# Patient Record
Sex: Female | Born: 1950 | Race: Black or African American | Hispanic: No | Marital: Single | Smoking: Current every day smoker
Health system: Southern US, Community
[De-identification: ages and names within clinical notes are randomized; demographics above are authoritative.]

## PROBLEM LIST (undated history)

## (undated) DIAGNOSIS — I10 Essential (primary) hypertension: Secondary | ICD-10-CM

## (undated) DIAGNOSIS — E213 Hyperparathyroidism, unspecified: Secondary | ICD-10-CM

## (undated) DIAGNOSIS — F32A Depression, unspecified: Secondary | ICD-10-CM

## (undated) DIAGNOSIS — E119 Type 2 diabetes mellitus without complications: Secondary | ICD-10-CM

## (undated) DIAGNOSIS — Z9289 Personal history of other medical treatment: Secondary | ICD-10-CM

## (undated) DIAGNOSIS — E785 Hyperlipidemia, unspecified: Secondary | ICD-10-CM

## (undated) DIAGNOSIS — L03811 Cellulitis of head [any part, except face]: Secondary | ICD-10-CM

## (undated) DIAGNOSIS — F329 Major depressive disorder, single episode, unspecified: Secondary | ICD-10-CM

## (undated) DIAGNOSIS — N189 Chronic kidney disease, unspecified: Secondary | ICD-10-CM

## (undated) DIAGNOSIS — D649 Anemia, unspecified: Secondary | ICD-10-CM

## (undated) HISTORY — DX: Major depressive disorder, single episode, unspecified: F32.9

## (undated) HISTORY — DX: Essential (primary) hypertension: I10

## (undated) HISTORY — DX: Chronic kidney disease, unspecified: N18.9

## (undated) HISTORY — DX: Hyperlipidemia, unspecified: E78.5

## (undated) HISTORY — DX: Type 2 diabetes mellitus without complications: E11.9

## (undated) HISTORY — DX: Depression, unspecified: F32.A

## (undated) HISTORY — PX: ABDOMINAL HYSTERECTOMY: SHX81

---

## 1998-11-05 ENCOUNTER — Emergency Department (HOSPITAL_COMMUNITY): Admission: EM | Admit: 1998-11-05 | Discharge: 1998-11-05 | Payer: Self-pay

## 1998-11-05 ENCOUNTER — Ambulatory Visit (HOSPITAL_COMMUNITY): Admission: RE | Admit: 1998-11-05 | Discharge: 1998-11-05 | Payer: Self-pay | Admitting: Obstetrics & Gynecology

## 1998-11-05 ENCOUNTER — Encounter: Payer: Self-pay | Admitting: Family Medicine

## 2000-11-15 ENCOUNTER — Emergency Department (HOSPITAL_COMMUNITY): Admission: EM | Admit: 2000-11-15 | Discharge: 2000-11-15 | Payer: Self-pay

## 2002-12-05 ENCOUNTER — Ambulatory Visit (HOSPITAL_COMMUNITY): Admission: RE | Admit: 2002-12-05 | Discharge: 2002-12-05 | Payer: Self-pay | Admitting: Internal Medicine

## 2003-10-05 ENCOUNTER — Ambulatory Visit: Payer: Self-pay | Admitting: Internal Medicine

## 2003-11-02 ENCOUNTER — Ambulatory Visit: Payer: Self-pay | Admitting: *Deleted

## 2004-05-12 ENCOUNTER — Ambulatory Visit: Payer: Self-pay | Admitting: Family Medicine

## 2004-10-08 ENCOUNTER — Ambulatory Visit: Payer: Self-pay | Admitting: Family Medicine

## 2005-03-20 ENCOUNTER — Ambulatory Visit: Payer: Self-pay | Admitting: Family Medicine

## 2005-04-22 ENCOUNTER — Ambulatory Visit: Payer: Self-pay | Admitting: Family Medicine

## 2005-06-04 ENCOUNTER — Ambulatory Visit: Payer: Self-pay | Admitting: Family Medicine

## 2005-08-17 ENCOUNTER — Ambulatory Visit: Payer: Self-pay | Admitting: Family Medicine

## 2006-05-12 ENCOUNTER — Ambulatory Visit: Payer: Self-pay | Admitting: Family Medicine

## 2006-05-18 ENCOUNTER — Ambulatory Visit: Payer: Self-pay | Admitting: Family Medicine

## 2006-06-01 ENCOUNTER — Ambulatory Visit: Payer: Self-pay | Admitting: Family Medicine

## 2006-07-11 ENCOUNTER — Emergency Department (HOSPITAL_COMMUNITY): Admission: EM | Admit: 2006-07-11 | Discharge: 2006-07-11 | Payer: Self-pay | Admitting: Emergency Medicine

## 2006-10-20 ENCOUNTER — Ambulatory Visit: Payer: Self-pay | Admitting: Internal Medicine

## 2006-10-20 ENCOUNTER — Encounter (INDEPENDENT_AMBULATORY_CARE_PROVIDER_SITE_OTHER): Payer: Self-pay | Admitting: *Deleted

## 2007-04-29 ENCOUNTER — Ambulatory Visit: Payer: Self-pay | Admitting: Internal Medicine

## 2007-04-29 ENCOUNTER — Encounter (INDEPENDENT_AMBULATORY_CARE_PROVIDER_SITE_OTHER): Payer: Self-pay | Admitting: Family Medicine

## 2007-04-29 LAB — CONVERTED CEMR LAB: Microalb, Ur: 0.96 mg/dL (ref 0.00–1.89)

## 2008-04-04 ENCOUNTER — Ambulatory Visit: Payer: Self-pay | Admitting: Internal Medicine

## 2008-04-05 ENCOUNTER — Encounter (INDEPENDENT_AMBULATORY_CARE_PROVIDER_SITE_OTHER): Payer: Self-pay | Admitting: Adult Health

## 2008-04-11 ENCOUNTER — Ambulatory Visit (HOSPITAL_COMMUNITY): Admission: RE | Admit: 2008-04-11 | Discharge: 2008-04-11 | Payer: Self-pay | Admitting: Family Medicine

## 2008-05-14 ENCOUNTER — Ambulatory Visit: Payer: Self-pay | Admitting: Internal Medicine

## 2008-05-14 ENCOUNTER — Encounter: Payer: Self-pay | Admitting: Family Medicine

## 2008-05-14 LAB — CONVERTED CEMR LAB
Albumin: 4.1 g/dL (ref 3.5–5.2)
BUN: 11 mg/dL (ref 6–23)
Basophils Absolute: 0 10*3/uL (ref 0.0–0.1)
Basophils Relative: 0 % (ref 0–1)
CO2: 24 meq/L (ref 19–32)
Cholesterol: 70 mg/dL (ref 0–200)
Eosinophils Relative: 1 % (ref 0–5)
Glucose, Bld: 144 mg/dL — ABNORMAL HIGH (ref 70–99)
HCT: 40.1 % (ref 36.0–46.0)
HDL: 42 mg/dL (ref >39)
Hemoglobin: 12.6 g/dL (ref 12.0–15.0)
Lymphocytes Relative: 37 % (ref 12–46)
MCHC: 31.4 g/dL (ref 30.0–36.0)
Monocytes Absolute: 0.4 10*3/uL (ref 0.1–1.0)
Monocytes Relative: 5 % (ref 3–12)
Neutro Abs: 4.7 10*3/uL (ref 1.7–7.7)
Potassium: 4.6 meq/L (ref 3.5–5.3)
RBC: 4.62 M/uL (ref 3.87–5.11)
RDW: 13.2 % (ref 11.5–15.5)
Sodium: 141 meq/L (ref 135–145)
Total Protein: 6.4 g/dL (ref 6.0–8.3)
Triglycerides: 68 mg/dL (ref <150)

## 2008-05-18 ENCOUNTER — Ambulatory Visit: Payer: Self-pay | Admitting: Internal Medicine

## 2008-07-09 ENCOUNTER — Ambulatory Visit: Payer: Self-pay | Admitting: Internal Medicine

## 2009-01-24 ENCOUNTER — Encounter: Payer: Self-pay | Admitting: Family Medicine

## 2009-01-24 ENCOUNTER — Ambulatory Visit: Payer: Self-pay | Admitting: Family Medicine

## 2009-01-24 LAB — CONVERTED CEMR LAB
Eosinophils Absolute: 0.1 10*3/uL (ref 0.0–0.7)
Eosinophils Relative: 1 % (ref 0–5)
HCT: 43.4 % (ref 36.0–46.0)
HDL: 36 mg/dL — ABNORMAL LOW (ref >39)
Hemoglobin: 14.6 g/dL (ref 12.0–15.0)
Lymphs Abs: 3.5 10*3/uL (ref 0.7–4.0)
MCHC: 33.6 g/dL (ref 30.0–36.0)
MCV: 84.6 fL (ref 78.0–100.0)
Monocytes Absolute: 0.4 10*3/uL (ref 0.1–1.0)
Monocytes Relative: 4 % (ref 3–12)
RBC: 5.13 M/uL — ABNORMAL HIGH (ref 3.87–5.11)
Total CHOL/HDL Ratio: 1.8
VLDL: 30 mg/dL (ref 0–40)
WBC: 10.1 10*3/uL (ref 4.0–10.5)

## 2009-02-07 ENCOUNTER — Ambulatory Visit: Payer: Self-pay | Admitting: Family Medicine

## 2009-05-09 ENCOUNTER — Ambulatory Visit: Payer: Self-pay | Admitting: Family Medicine

## 2009-08-22 ENCOUNTER — Ambulatory Visit: Payer: Self-pay | Admitting: Family Medicine

## 2009-10-23 ENCOUNTER — Encounter (INDEPENDENT_AMBULATORY_CARE_PROVIDER_SITE_OTHER): Payer: Self-pay | Admitting: Family Medicine

## 2009-10-23 LAB — CONVERTED CEMR LAB
AST: 14 units/L (ref 0–37)
Alkaline Phosphatase: 106 units/L (ref 39–117)
Glucose, Bld: 448 mg/dL — ABNORMAL HIGH (ref 70–99)
Sodium: 138 meq/L (ref 135–145)
Total Bilirubin: 0.2 mg/dL — ABNORMAL LOW (ref 0.3–1.2)
Total Protein: 6.8 g/dL (ref 6.0–8.3)

## 2009-11-22 ENCOUNTER — Ambulatory Visit: Payer: Self-pay | Admitting: Family Medicine

## 2011-12-21 ENCOUNTER — Ambulatory Visit: Payer: Self-pay | Admitting: Family Medicine

## 2012-01-14 ENCOUNTER — Ambulatory Visit (INDEPENDENT_AMBULATORY_CARE_PROVIDER_SITE_OTHER): Payer: Self-pay | Admitting: Family Medicine

## 2012-01-14 ENCOUNTER — Encounter: Payer: Self-pay | Admitting: Family Medicine

## 2012-01-14 VITALS — BP 169/78 | HR 92 | Temp 98.7°F | Ht 67.0 in | Wt 154.0 lb

## 2012-01-14 DIAGNOSIS — Z720 Tobacco use: Secondary | ICD-10-CM

## 2012-01-14 DIAGNOSIS — F17200 Nicotine dependence, unspecified, uncomplicated: Secondary | ICD-10-CM

## 2012-01-14 DIAGNOSIS — I10 Essential (primary) hypertension: Secondary | ICD-10-CM | POA: Insufficient documentation

## 2012-01-14 DIAGNOSIS — E1165 Type 2 diabetes mellitus with hyperglycemia: Secondary | ICD-10-CM | POA: Insufficient documentation

## 2012-01-14 DIAGNOSIS — E119 Type 2 diabetes mellitus without complications: Secondary | ICD-10-CM

## 2012-01-14 MED ORDER — METFORMIN HCL 1000 MG PO TABS: 1000.0000 mg | ORAL_TABLET | Freq: Two times a day (BID) | ORAL | Status: DC

## 2012-01-14 MED ORDER — GLIPIZIDE 10 MG PO TABS: 10.0000 mg | ORAL_TABLET | Freq: Two times a day (BID) | ORAL | Status: DC

## 2012-01-14 MED ORDER — LANCET DEVICES MISC: 1.0000 | Freq: Every day | Status: DC

## 2012-01-14 MED ORDER — INSULIN GLARGINE 100 UNIT/ML ~~LOC~~ SOLN: 30.0000 [IU] | Freq: Every day | SUBCUTANEOUS | Status: DC

## 2012-01-14 MED ORDER — LISINOPRIL-HYDROCHLOROTHIAZIDE 20-25 MG PO TABS: 1.0000 | ORAL_TABLET | Freq: Every day | ORAL | Status: DC

## 2012-01-14 MED ORDER — INSULIN GLARGINE 100 UNIT/ML ~~LOC~~ SOLN: SUBCUTANEOUS | Status: DC

## 2012-01-14 MED ORDER — GLUCOSE BLOOD VI STRP: ORAL_STRIP | Status: DC

## 2012-01-14 NOTE — Assessment & Plan Note (Signed)
Discussed dangers of smoking, pt in pre-contemplative stage.

## 2012-01-14 NOTE — Patient Instructions (Signed)
It was nice to meet you.  Please go to the [facility] and re-apply for the MAP (medication assistance program).   Please take your medications as prescribed.   Please come back and see me after you get the orange card so we can do a health maintenance exam and lab work, or as needed for problems.

## 2012-01-14 NOTE — Progress Notes (Signed)
  Subjective:    Patient ID: Sara Davis, female    DOB: 03/20/1950, 61 y.o.   MRN: [id number]  HPI  Sara Davis comes in to establish care.    DM- takes Lantus 30 (which she is out of), and glipizide 10 BID and metformin 1000 BID.  She has not been checking her blood sugar regularly because she is running out of supplies.  She says she checked it last week and it was 202.  Denies polyuria or polydipsia.   HTN: Was taking Lisinopril/HCTZ without difficulty, but has run out Denies chest pain, dizziness, palpitations, LE edema.  Patient does not check blood pressures.   Tobacco abuse: Has been smoking 1ppd since she was 19 (at least 40 pack year history).  Denies problems with asthma/COPD/Chronic bronchitis.  She is not currently interested in quitting.   Past Medical History  Diagnosis Date  . Hypertension   . Diabetes mellitus without complication   . Depression    Family History  Problem Relation Age of Onset  . Diabetes Mother   . Cancer Mother   . Diabetes Sister   . Hypertension Sister   . Cancer Sister 40    Bone  . Diabetes Brother    History  Substance Use Topics  . Smoking status: Current Every Day Smoker -- 1.0 packs/day for 40 years    Types: Cigarettes  . Smokeless tobacco: Never Used  . Alcohol Use: No    Review of Systems    Pertinent items in HPI Objective:   Physical Exam BP 169/78  Pulse 92  Temp 98.7 F (37.1 C) (Oral)  Ht 5\' 7"  (1.702 m)  Wt 154 lb (69.854 kg)  BMI 24.12 kg/m2 General appearance: alert, cooperative and no distress Neck: no adenopathy, supple, symmetrical, trachea midline and thyroid not enlarged, symmetric, no tenderness/mass/nodules Lungs: clear to auscultation bilaterally Heart: regular rate and rhythm, S1, S2 normal, no murmur, click, rub or gallop Extremities: extremities normal, atraumatic, no cyanosis or edema Pulses: 2+ and symmetric       Assessment & Plan:

## 2012-01-14 NOTE — Assessment & Plan Note (Signed)
Likely poorly controlled recently.  Gave pt 2 lantus solostar pens with needles, Rx for Lantus to take to [facility] for MAP program, Rx for metformin and glipizide sent to [facility].

## 2012-01-14 NOTE — Assessment & Plan Note (Signed)
Elevated but pt off medications, no symptoms.  Rx for Lisinopril/HCTZ, f/u after orange card to get labs.

## 2012-06-24 ENCOUNTER — Ambulatory Visit (INDEPENDENT_AMBULATORY_CARE_PROVIDER_SITE_OTHER): Payer: BC Managed Care – PPO | Admitting: Family Medicine

## 2012-06-24 VITALS — BP 170/83 | HR 87 | Temp 98.4°F | Ht 67.0 in | Wt 140.0 lb

## 2012-06-24 DIAGNOSIS — E119 Type 2 diabetes mellitus without complications: Secondary | ICD-10-CM

## 2012-06-24 DIAGNOSIS — F17200 Nicotine dependence, unspecified, uncomplicated: Secondary | ICD-10-CM

## 2012-06-24 DIAGNOSIS — I10 Essential (primary) hypertension: Secondary | ICD-10-CM

## 2012-06-24 DIAGNOSIS — Z720 Tobacco use: Secondary | ICD-10-CM

## 2012-06-24 LAB — POCT GLYCOSYLATED HEMOGLOBIN (HGB A1C): Hemoglobin A1C: >14

## 2012-06-24 MED ORDER — INSULIN GLARGINE 100 UNIT/ML SOLOSTAR PEN: 30.0000 [IU] | PEN_INJECTOR | Freq: Every day | SUBCUTANEOUS | Status: DC

## 2012-06-24 MED ORDER — INSULIN PEN NEEDLE 31G X 8 MM MISC: Status: DC

## 2012-06-24 MED ORDER — ACCU-CHEK AVIVA PLUS W/DEVICE KIT: 1.0000 | PACK | Freq: Once | Status: DC

## 2012-06-24 MED ORDER — GLIPIZIDE 10 MG PO TABS: 10.0000 mg | ORAL_TABLET | Freq: Two times a day (BID) | ORAL | Status: DC

## 2012-06-24 MED ORDER — LISINOPRIL-HYDROCHLOROTHIAZIDE 20-25 MG PO TABS: 1.0000 | ORAL_TABLET | Freq: Every day | ORAL | Status: DC

## 2012-06-24 MED ORDER — METFORMIN HCL 1000 MG PO TABS: 1000.0000 mg | ORAL_TABLET | Freq: Two times a day (BID) | ORAL | Status: DC

## 2012-06-24 MED ORDER — GLUCOSE BLOOD VI STRP: ORAL_STRIP | Status: DC

## 2012-06-24 MED ORDER — ACCU-CHEK FASTCLIX LANCET KIT: 1.0000 | PACK | Freq: Once | Status: DC

## 2012-06-24 NOTE — Assessment & Plan Note (Signed)
Elevated, off of medication, again stressed importance of compliance.  Meds refilled, f/u in one week.

## 2012-06-24 NOTE — Progress Notes (Signed)
  Subjective:    Patient ID: Sara Davis, female    DOB: 08/02/1950, 62 y.o.   MRN: [id number]  HPI:  Ms. Landuyt comes in for follow up. She is not feeling very well. She has lost weight, and complains of her feet being cold all the time.   DM: says she is taking Lantus 4 units (not the prescribed 30 units), and metformin and glipizide.  She has not been checking blood sugars because her pharmacy did not have the testing strips I had written a Rx for.   HTN: ran out of Prinizide, needs new refill.  Denies chest pain, dyspnea, LE edema, palpitations.   Past Medical History  Diagnosis Date  . Hypertension   . Diabetes mellitus without complication   . Depression     History  Substance Use Topics  . Smoking status: Current Every Day Smoker -- 1.00 packs/day for 40 years    Types: Cigarettes  . Smokeless tobacco: Never Used  . Alcohol Use: No    Family History  Problem Relation Age of Onset  . Diabetes Mother   . Cancer Mother   . Diabetes Sister   . Hypertension Sister   . Cancer Sister 49    Bone  . Diabetes Brother      ROS Pertinent items in HPI    Objective:  Physical Exam:  BP 170/83  Pulse 87  Temp(Src) 98.4 F (36.9 C) (Oral)  Ht 5\' 7"  (1.702 m)  Wt 140 lb (63.504 kg)  BMI 21.92 kg/m2 General appearance: alert, cooperative and no distress Head: Normocephalic, without obvious abnormality, atraumatic Lungs: clear to auscultation bilaterally Heart: regular rate and rhythm, S1, S2 normal, no murmur, click, rub or gallop Pulses: 2+ and symmetric Extrem: No edema, feet warm and well perfused.        Assessment & Plan:

## 2012-06-24 NOTE — Assessment & Plan Note (Signed)
Has cut down to 1/2 ppd but not ready to quit.

## 2012-06-24 NOTE — Patient Instructions (Signed)
It was good to see you today.  Your Hemoglobin A1C is  Lab Results  Component Value Date   HGBA1C >14.0 06/24/2012  .  Remember your goal for A1C is less than 7.  Your goal for fasting morning blood sugar is 80-120.    Please take 30 units of lantus daily, and keep a record of your blood sugars- check it once in the morning before eating and one in the PM.   Your blood pressure today was BP: 170/83 mmHg.  Remember your goal blood pressure is about 130/80.  Please be sure to take your medication every day.  I have sent in refills for all your medications.  Please call the office if you have any difficulty getting your medications.

## 2012-06-24 NOTE — Assessment & Plan Note (Addendum)
A1C >14, and patient with poor compliance and initiative to care for herself.  Rx for new glucometer, and supplies, Lantus, metformin and glipizide refilled.  Stressed importance of taking medications every day, including lantus at proper doses. I have asked her to keep a blood sugar log and bring it to next visit. I want her to follow up in 1 week given how high blood sugars are. Suspect weight loss due to elevated sugar, I also suspect her "cold feet" symptoms are related.  Will re-address both if the persist after blood sugar is better controlled.

## 2012-06-28 ENCOUNTER — Telehealth: Payer: Self-pay | Admitting: *Deleted

## 2012-06-28 MED ORDER — ONETOUCH PING METER REMOTE SUPPLIES MISC: 1.0000 | Freq: Once | Status: DC

## 2012-06-28 MED ORDER — GLUCOSE BLOOD VI STRP: ORAL_STRIP | Status: DC

## 2012-06-28 MED ORDER — ONETOUCH ULTRASOFT LANCETS MISC: Status: DC

## 2012-06-28 NOTE — Telephone Encounter (Signed)
Received prior authorization form from [facility] for test strips.  Form placed in doctor's box for completion, please place in fax box when complete. Tildon Husky, RN-BSN

## 2012-06-28 NOTE — Addendum Note (Signed)
Addended by: Cletus Gash on: 06/28/2012 01:53 PM   Modules accepted: Orders, Medications

## 2012-06-28 NOTE — Telephone Encounter (Signed)
Pt informed by Vinnie Level - healthcare coach that preferred strips are waiting at pharmacy. Needs different prescription instead of Lantus ( $200). Pt reports fasting BS 225, took 30 units and ate breakfast ( one egg, sausage patty, and toast) BS sometime after per pt 325. As reported by Vinnie Level - Geophysicist/field seismologist. Tildon Husky, RN-BSN

## 2012-06-28 NOTE — Telephone Encounter (Signed)
New Rx sent to patient's pharmacy that is the preferred meter for [employer].  Please notify patient.

## 2012-06-28 NOTE — Progress Notes (Signed)
Spoke with Thayer Headings.  Pt reports feeling okay.  Reports fasting bs at 225.  Will follow up with PCP at next appointment 07/01/12.

## 2012-06-28 NOTE — Telephone Encounter (Signed)
Attempted to notify pt - no answer will try again. Tildon Husky, RN-BSN

## 2012-06-29 MED ORDER — INSULIN GLARGINE 100 UNIT/ML ~~LOC~~ SOLN: 30.0000 [IU] | Freq: Every day | SUBCUTANEOUS | Status: DC

## 2012-06-29 NOTE — Telephone Encounter (Signed)
I spoke with pharmacy as well. Lantus is actually $294 for almost 2 month supply ( without insurance $351 ) - pt must purchase all 5 pens in box at a time ( which is more than 1 month supply - cannot be split). Possibly cheaper to prescribe vials/needles - deductible for pt is also an issue.Tildon Husky, RN-BSN

## 2012-06-29 NOTE — Telephone Encounter (Signed)
Can someone contact her pharmacy and find out why the Lantus is so expensive? If she has [employer] it should be affordable.  Thanks!

## 2012-06-29 NOTE — Telephone Encounter (Signed)
Called patient, told her that she could call the insurance company and see what is the cheapest long acting insulin, or I could send in regular (not solostar pen) lantus and she could see how expensive it is.  She asks me to send it in, says she has syringes.  Will discuss further at her appointment on Friday.

## 2012-06-29 NOTE — Telephone Encounter (Signed)
Spoke with Pharmacy, patient may have a deductible that she has to meet (patients responsibility to call her insurance company) but when they ran her insurance that is how much Lantus costs.  Pharmacy say they carry Novolin N (longest acting but would have to be dosed BID) that is $25 a vial.  Will message to MD.  Pt has f/u appt 07/01/12.  Sara Davis, Loralyn [place], [place]

## 2012-06-29 NOTE — Addendum Note (Signed)
Addended by: Cletus Gash on: 06/29/2012 02:03 PM   Modules accepted: Orders, Medications

## 2012-07-01 ENCOUNTER — Encounter: Payer: Self-pay | Admitting: Family Medicine

## 2012-07-01 ENCOUNTER — Ambulatory Visit (INDEPENDENT_AMBULATORY_CARE_PROVIDER_SITE_OTHER): Payer: BC Managed Care – PPO | Admitting: Family Medicine

## 2012-07-01 VITALS — BP 145/76 | HR 84 | Temp 98.0°F | Ht 67.0 in | Wt 145.0 lb

## 2012-07-01 DIAGNOSIS — E1165 Type 2 diabetes mellitus with hyperglycemia: Secondary | ICD-10-CM

## 2012-07-01 DIAGNOSIS — I10 Essential (primary) hypertension: Secondary | ICD-10-CM

## 2012-07-01 MED ORDER — INSULIN DETEMIR 100 UNIT/ML ~~LOC~~ SOLN: 15.0000 [IU] | Freq: Two times a day (BID) | SUBCUTANEOUS | Status: DC

## 2012-07-01 NOTE — Assessment & Plan Note (Signed)
Still slightly elevated, but given other issues with DM will continue current medication and monitor, f/u in 2 weeks.

## 2012-07-01 NOTE — Patient Instructions (Signed)
I am sorry you are having so much trouble getting your medications.  I have sent two long acting medications to your pharmacy:  -Lantus (regular vial) that you would take 30 units once a day -Levemir that you would take 15 units twice a day Please check with your pharmacy to see which one is cheaper.   I also recommend you call your insurance company and find out the most inexpensive testing supplies that they help cover.    I am going to ask our Health Coach, Lamont Dowdy to call you to set up an appointment if you need help getting all your supplies.

## 2012-07-01 NOTE — Assessment & Plan Note (Signed)
Patient with insurance but not able to afford lantus and supplies.  I sent in Rx for levemir, advised to get one or the other-lantus or levemir, whichever is cheaper.  She voices understanding.  I have also asked her to call her insurance company to find out which testing supplies will be most affordable.  I will have our health coach contact her to see if she needs assistance with this.   - advised to continue Lantus for now (or switch to Levemir), and keep blood sugar log.

## 2012-07-01 NOTE — Progress Notes (Signed)
  Subjective:    Patient ID: Sara Davis, female    DOB: [birth year], 62 y.o.   MRN: [id number]  HPI:  Ms. Sara Davis comes in for follow up of her poorly controlled DM II:  - She has started taking Lantus (had some at home) 30 units daily.  She is also taking the metformin and glipizide.  She says she is feeling much better.  Is using her sister's testing supplies, blood sugar was 166 today.  - Lantus solostar pen was nearly $300 for 2 month supply- she cannot afford this.  I sent in a Rx for regular lantus vial, she has not been able to check how much that is.  - She was able to get a meter, but the testing strips were $150, she cannot afford this.  She is using her sister's supplies which are sent by mail order.   HTN: Taking Lisionpril/HCTZ, no chest pain, no dyspnea, no palpitations.   Past Medical History  Diagnosis Date  . Hypertension   . Diabetes mellitus without complication   . Depression     History  Substance Use Topics  . Smoking status: Current Every Day Smoker -- 1.00 packs/day for 40 years    Types: Cigarettes  . Smokeless tobacco: Never Used  . Alcohol Use: No    Family History  Problem Relation Age of Onset  . Diabetes Mother   . Cancer Mother   . Diabetes Sister   . Hypertension Sister   . Cancer Sister 5    Bone  . Diabetes Brother      ROS Pertinent items in HPI    Objective:  Physical Exam:  BP 145/76  Pulse 84  Temp(Src) 98 F (36.7 C) (Oral)  Ht 5\' 7"  (1.702 m)  Wt 145 lb (65.772 kg)  BMI 22.71 kg/m2 General appearance: alert, cooperative and no distress Head: Normocephalic, without obvious abnormality, atraumatic Lungs: clear to auscultation bilaterally Heart: regular rate and rhythm, S1, S2 normal, no murmur, click, rub or gallop Pulses: 2+ and symmetric       Assessment & Plan:

## 2012-07-05 ENCOUNTER — Telehealth: Payer: Self-pay | Admitting: *Deleted

## 2012-07-05 MED ORDER — INSULIN PEN NEEDLE 31G X 8 MM MISC: Status: DC

## 2012-07-05 MED ORDER — BAYER CONTOUR MONITOR DEVI: Status: DC

## 2012-07-19 ENCOUNTER — Ambulatory Visit (INDEPENDENT_AMBULATORY_CARE_PROVIDER_SITE_OTHER): Payer: BC Managed Care – PPO | Admitting: Family Medicine

## 2012-07-19 ENCOUNTER — Encounter: Payer: Self-pay | Admitting: Family Medicine

## 2012-07-19 VITALS — BP 134/68 | Temp 98.0°F | Ht 66.5 in | Wt 149.0 lb

## 2012-07-19 DIAGNOSIS — E1165 Type 2 diabetes mellitus with hyperglycemia: Secondary | ICD-10-CM

## 2012-07-19 DIAGNOSIS — F17200 Nicotine dependence, unspecified, uncomplicated: Secondary | ICD-10-CM

## 2012-07-19 DIAGNOSIS — Z720 Tobacco use: Secondary | ICD-10-CM

## 2012-07-19 DIAGNOSIS — I10 Essential (primary) hypertension: Secondary | ICD-10-CM

## 2012-07-19 NOTE — Assessment & Plan Note (Signed)
Improved control.  Pt feeling bad when sugar is below 100.  I have discussed with her that she should continue the metformin both doses, but if she does not eat well in the evening or her blood sugar is low, then she can hold her evening dose of glipizide to avoid a low blood sugar in the morning.  She voices understanding.  Will have her come back in in about 1 month for A1C check.  -Retinal scan today - Will check lipids.

## 2012-07-19 NOTE — Assessment & Plan Note (Signed)
Good control, continue current medications, check BMET.

## 2012-07-19 NOTE — Progress Notes (Signed)
  Subjective:    Patient ID: Sara Davis, female    DOB: 04/11/1950, 62 y.o.   MRN: [id number]  HPI:  Sara Davis comes in for diabetes follow up.  She is doing much better.  She is taking Lantus 30 units daily, Metformin 1000 mg BID, and glipizide 10 BID.  She was able to get all her testing supplies.  Has been checking blood sugar TID.  Fasting sugars are now mostly around 150, but some as low as 85, she feels shaky when it is that low.  PM sugars as high as 200, but non higher than 250's.  Feels overall much better.   HTN: Taking lisinopril/hctz without difficulty.  Denies chest pain, dizziness, palpitations, LE edema.  Patient is not check blood pressures.   Tobacco Abuse: still smoking between 1/2 ppd and a ppd.  Trying to cut back.  Has cravings when she goes to long without smoking and when stressed.    Past Medical History  Diagnosis Date  . Hypertension   . Diabetes mellitus without complication   . Depression     History  Substance Use Topics  . Smoking status: Current Every Day Smoker -- 1.00 packs/day for 40 years    Types: Cigarettes  . Smokeless tobacco: Never Used  . Alcohol Use: No    Family History  Problem Relation Age of Onset  . Diabetes Mother   . Cancer Mother   . Diabetes Sister   . Hypertension Sister   . Cancer Sister 52    Bone  . Diabetes Brother      ROS Pertinent items in HPI    Objective:  Physical Exam:  BP 134/68  Temp(Src) 98 F (36.7 C) (Oral)  Ht 5' 6.5" (1.689 m)  Wt 149 lb (67.586 kg)  BMI 23.69 kg/m2 General appearance: alert, cooperative and no distress Head: Normocephalic, without obvious abnormality, atraumatic Lungs: clear to auscultation bilaterally Heart: regular rate and rhythm, S1, S2 normal, no murmur, click, rub or gallop Pulses: 2+ and symmetric       Assessment & Plan:

## 2012-07-19 NOTE — Patient Instructions (Signed)
I am glad your diabetes is so much better.  Please continue to take your Lantus 30 units once a day, and metformin 1000 twice a day.  Take the glipizide 10 in the morning, and in the evening if you think you did not eat well or your blood sugar is low you can hold the evening dose.   Your blood pressure today was BP: 134/68 mmHg.  Remember your goal blood pressure is about 130/80.  Please be sure to take your medication every day.    Please come back for a check up in 2 months.   Please come and have your blood work drawn here on Friday morning, do not eat or drink before hand.  I will send you a letter with your lab results, or call you if anything is abnormal.

## 2012-07-19 NOTE — Assessment & Plan Note (Signed)
Counseled to quit, offered assistance, she will think about options.

## 2012-07-22 ENCOUNTER — Other Ambulatory Visit: Payer: BC Managed Care – PPO

## 2012-07-22 DIAGNOSIS — E1165 Type 2 diabetes mellitus with hyperglycemia: Secondary | ICD-10-CM

## 2012-07-22 DIAGNOSIS — I10 Essential (primary) hypertension: Secondary | ICD-10-CM

## 2012-07-22 LAB — LIPID PANEL
Cholesterol: 104 mg/dL (ref 0–200)
HDL: 55 mg/dL (ref >39)
Triglycerides: 104 mg/dL (ref <150)

## 2012-07-22 LAB — BASIC METABOLIC PANEL
CO2: 26 mEq/L (ref 19–32)
Calcium: 9.4 mg/dL (ref 8.4–10.5)
Creat: 0.66 mg/dL (ref 0.50–1.10)
Glucose, Bld: 105 mg/dL — ABNORMAL HIGH (ref 70–99)

## 2012-07-22 NOTE — Progress Notes (Signed)
BMP AND FLP DONE TODAY Sara Davis

## 2012-07-26 ENCOUNTER — Encounter: Payer: Self-pay | Admitting: Family Medicine

## 2012-08-09 ENCOUNTER — Encounter: Payer: Self-pay | Admitting: Family Medicine

## 2012-09-08 ENCOUNTER — Ambulatory Visit: Payer: BC Managed Care – PPO | Admitting: Family Medicine

## 2012-09-12 ENCOUNTER — Ambulatory Visit: Payer: BC Managed Care – PPO | Admitting: Family Medicine

## 2012-09-21 ENCOUNTER — Ambulatory Visit (INDEPENDENT_AMBULATORY_CARE_PROVIDER_SITE_OTHER): Payer: BC Managed Care – PPO | Admitting: Family Medicine

## 2012-09-21 ENCOUNTER — Encounter: Payer: Self-pay | Admitting: Family Medicine

## 2012-09-21 VITALS — BP 138/70 | HR 79 | Temp 97.8°F | Wt 155.0 lb

## 2012-09-21 DIAGNOSIS — Z720 Tobacco use: Secondary | ICD-10-CM

## 2012-09-21 DIAGNOSIS — F17200 Nicotine dependence, unspecified, uncomplicated: Secondary | ICD-10-CM

## 2012-09-21 DIAGNOSIS — E1165 Type 2 diabetes mellitus with hyperglycemia: Secondary | ICD-10-CM

## 2012-09-21 DIAGNOSIS — I10 Essential (primary) hypertension: Secondary | ICD-10-CM

## 2012-09-21 MED ORDER — NICOTINE 21 MG/24HR TD PT24: 1.0000 | MEDICATED_PATCH | TRANSDERMAL | Status: DC

## 2012-09-21 MED ORDER — GLIPIZIDE 10 MG PO TABS: 10.0000 mg | ORAL_TABLET | Freq: Every day | ORAL | Status: DC

## 2012-09-21 NOTE — Assessment & Plan Note (Signed)
Increase metformin to 1000mg  BID Change Glipizide 10 mg to QAM Continue current lantus

## 2012-09-21 NOTE — Assessment & Plan Note (Signed)
Appropriate today No change Cont therapy

## 2012-09-21 NOTE — Progress Notes (Signed)
Sara Davis is a 62 y.o. female who presents to [facility] today for f/u for HTN, DM  HTN: Denies CP, palpitations, syncope, SOB. Taking lisinopril HCTZ.   DM: am glucose typically 130-160 range (255 today). Taking glipizide once daily at night and metformin once daily at night. Lantus 30units Qam. Current dosing based on one time glucose of 68 in am when pt was on glipizide 10 BID and metformin 1000mg  BID.   Smoker: 1.5ppd. Trying to cut down. Doing this by buying fewer.   The following portions of the patient's history were reviewed and updated as appropriate: allergies, current medications, past medical history, family and social history, and problem list.  Patient is a nonsmoker.  Past Medical History  Diagnosis Date  . Hypertension   . Diabetes mellitus without complication   . Depression     ROS as above otherwise neg.    Medications reviewed. Current Outpatient Prescriptions  Medication Sig Dispense Refill  . Blood Glucose Monitoring Suppl (CONTOUR BLOOD GLUCOSE SYSTEM) DEVI Use to test blood sugars once in the morning before food and once in the evening. DX: 250.02  1 Device  0  . glipiZIDE (GLUCOTROL) 10 MG tablet Take 1 tablet (10 mg total) by mouth daily with breakfast.  30 tablet  11  . glucose blood (ONE TOUCH TEST STRIPS) test strip Please check blood sugar once daily in AM before eating and once daily in PM  100 each  12  . insulin glargine (LANTUS) 100 UNIT/ML injection Inject 0.3 mLs (30 Units total) into the skin daily.  10 mL  12  . Insulin Pen Needle (B-D ULTRAFINE III SHORT PEN) 31G X 8 MM MISC Check blood sugars once in morning before food and once in the evening.  100 each  11  . Lancets (ONETOUCH ULTRASOFT) lancets Please check blood sugar once daily in AM before eating and once daily in PM  100 each  12  . lisinopril-hydrochlorothiazide (PRINZIDE,ZESTORETIC) 20-25 MG per tablet Take 1 tablet by mouth daily.  30 tablet  11  . metFORMIN (GLUCOPHAGE) 1000 MG tablet  Take 1 tablet (1,000 mg total) by mouth 2 (two) times daily with a meal.  60 tablet  11  . nicotine (NICODERM CQ - DOSED IN MG/24 HOURS) 21 mg/24hr patch Place 1 patch onto the skin daily.  28 patch  0   No current facility-administered medications for this visit.    Exam: BP 138/70  Pulse 79  Temp(Src) 97.8 F (36.6 C) (Oral)  Wt 155 lb (70.308 kg)  BMI 24.65 kg/m2 Gen: Well NAD HEENT: EOMI,  MMM    No results found for this or any previous visit (from the past 3 hour(s)).

## 2012-09-21 NOTE — Assessment & Plan Note (Signed)
Motivated to quit Spent >42min discussing stopping Recommending nicotine patches and regular interventions

## 2012-09-21 NOTE — Patient Instructions (Addendum)
You are doing great  Please start using the nicotine patches Please take your glipizide in the morning Please take your metformin twice daily Please continue taking your lantus Please come back to see me in 4 weeks for a complete physical

## 2012-10-26 ENCOUNTER — Ambulatory Visit: Payer: BC Managed Care – PPO | Admitting: Family Medicine

## 2012-11-22 ENCOUNTER — Ambulatory Visit (INDEPENDENT_AMBULATORY_CARE_PROVIDER_SITE_OTHER): Payer: BC Managed Care – PPO | Admitting: Family Medicine

## 2012-11-22 ENCOUNTER — Encounter: Payer: Self-pay | Admitting: Family Medicine

## 2012-11-22 VITALS — BP 160/90 | HR 90 | Temp 98.1°F | Resp 20 | Ht 66.5 in | Wt 157.0 lb

## 2012-11-22 DIAGNOSIS — F17200 Nicotine dependence, unspecified, uncomplicated: Secondary | ICD-10-CM

## 2012-11-22 DIAGNOSIS — E1165 Type 2 diabetes mellitus with hyperglycemia: Secondary | ICD-10-CM

## 2012-11-22 DIAGNOSIS — I10 Essential (primary) hypertension: Secondary | ICD-10-CM

## 2012-11-22 DIAGNOSIS — Z23 Encounter for immunization: Secondary | ICD-10-CM | POA: Diagnosis not present

## 2012-11-22 DIAGNOSIS — Z720 Tobacco use: Secondary | ICD-10-CM

## 2012-11-22 NOTE — Assessment & Plan Note (Signed)
Reiterated importance of taking medications as prescribed Typically well controlled No  Change today

## 2012-11-22 NOTE — Assessment & Plan Note (Signed)
Provided outside resource material for tobacco cessation. [place]uitline  Pt cutting back slowly Encouraged

## 2012-11-22 NOTE — Patient Instructions (Signed)
You are doing great overall Please start taking your metformin twice a day (morning and night) Please start titrating your insulin based on your morning sugar as follows  Sugar between 100-200 then keep your dose the same Sugar greater than 200 then increase your dose by 3 units Sugar less than 100 then decrease your dose by 3 units.   For example you take 30 units daily. If your morning sugar is 210 then take 33 units that day. If the next day your sugar is 150 then stay at the 33 units. If you sugar drops to 90 then decrease your insulin back to 30 units  Please call with any questions or concerns  Please come back in 3 months fo ryour next check up

## 2012-11-22 NOTE — Assessment & Plan Note (Addendum)
A1c today much improved at 8.8 Still not at goal Pt to increase metformin to 1000 BID (up from Qday) Pt to start lantus titrations. Spent >10 minutes carefully describing, diagraming, and writing down titration schedule. Pt expressed clear understanding   Sugar between 100-200 then keep your dose the same Sugar greater than 200 then increase your dose by 3 units Sugar less than 100 then decrease your dose by 3 units.

## 2012-11-22 NOTE — Progress Notes (Signed)
Sara Davis is a 62 y.o. female who presents to [facility] today for CPE  DM: am glucose around 207. Changed glipizide to am. lantus 30 units in am.   Tobacco: 1/2 ppd. Previously 1ppd. Doing it by self. Can't afford nicotine.   HTN: Misses doses from time to time. Did not take today. No CP, SOB, Palp.   The following portions of the patient's history were reviewed and updated as appropriate: allergies, current medications, past medical history, family and social history, and problem list.  Patient is a smoker  Past Medical History  Diagnosis Date  . Hypertension   . Diabetes mellitus without complication   . Depression     ROS as above otherwise neg.    Medications reviewed. Current Outpatient Prescriptions  Medication Sig Dispense Refill  . Blood Glucose Monitoring Suppl (CONTOUR BLOOD GLUCOSE SYSTEM) DEVI Use to test blood sugars once in the morning before food and once in the evening. DX: 250.02  1 Device  0  . glipiZIDE (GLUCOTROL) 10 MG tablet Take 1 tablet (10 mg total) by mouth daily with breakfast.  30 tablet  11  . glucose blood (ONE TOUCH TEST STRIPS) test strip Please check blood sugar once daily in AM before eating and once daily in PM  100 each  12  . insulin glargine (LANTUS) 100 UNIT/ML injection Inject 0.3 mLs (30 Units total) into the skin daily.  10 mL  12  . Insulin Pen Needle (B-D ULTRAFINE III SHORT PEN) 31G X 8 MM MISC Check blood sugars once in morning before food and once in the evening.  100 each  11  . Lancets (ONETOUCH ULTRASOFT) lancets Please check blood sugar once daily in AM before eating and once daily in PM  100 each  12  . lisinopril-hydrochlorothiazide (PRINZIDE,ZESTORETIC) 20-25 MG per tablet Take 1 tablet by mouth daily.  30 tablet  11  . metFORMIN (GLUCOPHAGE) 1000 MG tablet Take 1 tablet (1,000 mg total) by mouth 2 (two) times daily with a meal.  60 tablet  11  . nicotine (NICODERM CQ - DOSED IN MG/24 HOURS) 21 mg/24hr patch Place 1 patch onto the  skin daily.  28 patch  0   No current facility-administered medications for this visit.    Exam: BP 160/90  Pulse 90  Temp(Src) 98.1 F (36.7 C) (Oral)  Resp 20  Ht 5' 6.5" (1.689 m)  Wt 157 lb (71.215 kg)  BMI 24.96 kg/m2 Gen: Well NAD HEENT: EOMI,  MMM Lungs: CTABL Nl WOB Heart: RRR no MRG Abd: NABS, NT, ND Exts: Non edematous BL  LE, warm and well perfused.   Results for orders placed in visit on 11/22/12 (from the past 72 hour(s))  POCT GLYCOSYLATED HEMOGLOBIN (HGB A1C)     Status: Abnormal   Collection Time    11/22/12 10:30 AM      Result Value Range   Hemoglobin A1C 8.8      Pneumovax and TDAP today

## 2013-03-08 ENCOUNTER — Ambulatory Visit: Payer: BC Managed Care – PPO | Admitting: Family Medicine

## 2013-07-31 ENCOUNTER — Encounter: Payer: Self-pay | Admitting: Family Medicine

## 2013-07-31 ENCOUNTER — Ambulatory Visit (INDEPENDENT_AMBULATORY_CARE_PROVIDER_SITE_OTHER): Payer: BC Managed Care – PPO | Admitting: Family Medicine

## 2013-07-31 VITALS — BP 188/103 | HR 91 | Temp 98.0°F | Wt 163.0 lb

## 2013-07-31 DIAGNOSIS — Z720 Tobacco use: Secondary | ICD-10-CM

## 2013-07-31 DIAGNOSIS — IMO0001 Reserved for inherently not codable concepts without codable children: Secondary | ICD-10-CM

## 2013-07-31 DIAGNOSIS — G4700 Insomnia, unspecified: Secondary | ICD-10-CM

## 2013-07-31 DIAGNOSIS — I10 Essential (primary) hypertension: Secondary | ICD-10-CM

## 2013-07-31 DIAGNOSIS — F17200 Nicotine dependence, unspecified, uncomplicated: Secondary | ICD-10-CM

## 2013-07-31 DIAGNOSIS — IMO0002 Reserved for concepts with insufficient information to code with codable children: Secondary | ICD-10-CM

## 2013-07-31 DIAGNOSIS — E1165 Type 2 diabetes mellitus with hyperglycemia: Secondary | ICD-10-CM

## 2013-07-31 LAB — COMPREHENSIVE METABOLIC PANEL
ALT: 11 U/L (ref 0–35)
AST: 14 U/L (ref 0–37)
Albumin: 3.4 g/dL — ABNORMAL LOW (ref 3.5–5.2)
Alkaline Phosphatase: 80 U/L (ref 39–117)
BUN: 17 mg/dL (ref 6–23)
CALCIUM: 9.1 mg/dL (ref 8.4–10.5)
CO2: 22 meq/L (ref 19–32)
CREATININE: 0.75 mg/dL (ref 0.50–1.10)
Chloride: 98 mEq/L (ref 96–112)
Glucose, Bld: 412 mg/dL — ABNORMAL HIGH (ref 70–99)
Potassium: 4.4 mEq/L (ref 3.5–5.3)
Sodium: 133 mEq/L — ABNORMAL LOW (ref 135–145)
Total Bilirubin: 0.2 mg/dL (ref 0.2–1.2)
Total Protein: 5.5 g/dL — ABNORMAL LOW (ref 6.0–8.3)

## 2013-07-31 LAB — LIPID PANEL
CHOLESTEROL: 83 mg/dL (ref 0–200)
HDL: 36 mg/dL — AB (ref >39)
LDL Cholesterol: 10 mg/dL (ref 0–99)
TRIGLYCERIDES: 185 mg/dL — AB (ref <150)
Total CHOL/HDL Ratio: 2.3 Ratio
VLDL: 37 mg/dL (ref 0–40)

## 2013-07-31 LAB — POCT GLYCOSYLATED HEMOGLOBIN (HGB A1C): Hemoglobin A1C: 12.4

## 2013-07-31 MED ORDER — METFORMIN HCL 1000 MG PO TABS: 1000.0000 mg | ORAL_TABLET | Freq: Two times a day (BID) | ORAL | Status: DC

## 2013-07-31 MED ORDER — LISINOPRIL-HYDROCHLOROTHIAZIDE 20-25 MG PO TABS: 1.0000 | ORAL_TABLET | Freq: Every day | ORAL | Status: DC

## 2013-07-31 MED ORDER — INSULIN GLARGINE 100 UNIT/ML ~~LOC~~ SOLN: 30.0000 [IU] | Freq: Every day | SUBCUTANEOUS | Status: DC

## 2013-07-31 MED ORDER — GLIPIZIDE 10 MG PO TABS: 10.0000 mg | ORAL_TABLET | Freq: Every day | ORAL | Status: DC

## 2013-07-31 NOTE — Assessment & Plan Note (Addendum)
A1c > 12 Unsure if pt being truthful about home glucose. Unable to increase lantus if CBG truly around 100.  Pt out of insulin for 2 wks Refill today F/u in 2 wks Lipid panel and CMET today No taking glipizide

## 2013-07-31 NOTE — Patient Instructions (Addendum)
You are doing well overall  Please restart your medications and come back in 2 weeks for a nursing blood pressure recheck Please come back to see a doctor in 3 months Pleases start taking a daily baby aspirin

## 2013-07-31 NOTE — Assessment & Plan Note (Addendum)
Out of meds Refill  Recheck in 2 wks Start ASA 81

## 2013-07-31 NOTE — Assessment & Plan Note (Signed)
Likely emotionally driven.  Poor sleep hygiene.  Pt to try to improve nightly routine on getting to sleep, and overall sleep hygiene.  [place] OTC PRN.

## 2013-07-31 NOTE — Progress Notes (Signed)
Sara Davis is a 63 y.o. female who presents to [facility] today for f/u   HTN:out of medications for 2 wks. Denies CP, SOB, palpitations. Ran out of meds due to insurance per pt.   DM: out of medications for 2 wks. lantus 30 units daily. Previously taking metformin 1000mg  BID. CBG in am around 98-120 per pt.   Unable to sleep at night: ongoing for 4-5 mo. "lays awake thinking of bad stuff." only sleeps a few hours a night. Tries to go to bed at 1am nightly. Daily 1hr naps. 1 cup coffee daily. Denies drug use. Denies any major event that started symptoms. Every night.    The following portions of the patient's history were reviewed and updated as appropriate: allergies, current medications, past medical history, family and social history, and problem list.  Patient is a smoker  Past Medical History  Diagnosis Date  . Hypertension   . Diabetes mellitus without complication   . Depression     ROS as above otherwise neg.    Medications reviewed. Current Outpatient Prescriptions  Medication Sig Dispense Refill  . aspirin 81 MG tablet Take 81 mg by mouth daily.      . Blood Glucose Monitoring Suppl (CONTOUR BLOOD GLUCOSE SYSTEM) DEVI Use to test blood sugars once in the morning before food and once in the evening. DX: 250.02  1 Device  0  . glipiZIDE (GLUCOTROL) 10 MG tablet Take 1 tablet (10 mg total) by mouth daily with breakfast.  30 tablet  11  . glucose blood (ONE TOUCH TEST STRIPS) test strip Please check blood sugar once daily in AM before eating and once daily in PM  100 each  12  . insulin glargine (LANTUS) 100 UNIT/ML injection Inject 0.3 mLs (30 Units total) into the skin daily.  10 mL  12  . Insulin Pen Needle (B-D ULTRAFINE III SHORT PEN) 31G X 8 MM MISC Check blood sugars once in morning before food and once in the evening.  100 each  11  . Lancets (ONETOUCH ULTRASOFT) lancets Please check blood sugar once daily in AM before eating and once daily in PM  100 each  12  .  lisinopril-hydrochlorothiazide (PRINZIDE,ZESTORETIC) 20-25 MG per tablet Take 1 tablet by mouth daily.  30 tablet  11  . metFORMIN (GLUCOPHAGE) 1000 MG tablet Take 1 tablet (1,000 mg total) by mouth 2 (two) times daily with a meal.  60 tablet  11  . nicotine (NICODERM CQ - DOSED IN MG/24 HOURS) 21 mg/24hr patch Place 1 patch onto the skin daily.  28 patch  0   No current facility-administered medications for this visit.    Exam:  BP 188/103  Pulse 91  Temp(Src) 98 F (36.7 C) (Oral)  Wt 163 lb (73.936 kg) Gen: Well NAD HEENT: EOMI,  MMM Lungs: CTABL Nl WOB Heart: RRR no MRG Abd: NABS, NT, ND   Results for orders placed in visit on 07/31/13 (from the past 72 hour(s))  POCT GLYCOSYLATED HEMOGLOBIN (HGB A1C)     Status: Abnormal   Collection Time    07/31/13 10:11 AM      Result Value Ref Range   Hemoglobin A1C 12.4      A/P (as seen in Problem list)  HTN (hypertension) Out of meds Refill  Recheck in 2 wks Start ASA 81  Diabetes mellitus out of control A1c > 12 Unsure if pt being truthful about home glucose. Unable to increase lantus if CBG truly around 100.  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  Poor sleep hygiene.  Pt to try to improve nightly routine on getting to sleep, and overall sleep hygiene.  [place] OTC PRN.    Patchy puritic rash - start otc hydrocortisone

## 2013-11-03 ENCOUNTER — Other Ambulatory Visit: Payer: Self-pay | Admitting: Family Medicine

## 2013-12-30 ENCOUNTER — Emergency Department (HOSPITAL_COMMUNITY)
Admission: EM | Admit: 2013-12-30 | Discharge: 2013-12-30 | Disposition: A | Discharge status: Home / Self Care | Payer: BC Managed Care – PPO | Attending: Emergency Medicine | Admitting: Emergency Medicine

## 2013-12-30 ENCOUNTER — Emergency Department (HOSPITAL_COMMUNITY): Payer: BC Managed Care – PPO

## 2013-12-30 ENCOUNTER — Encounter (HOSPITAL_COMMUNITY): Payer: Self-pay | Admitting: *Deleted

## 2013-12-30 DIAGNOSIS — E1165 Type 2 diabetes mellitus with hyperglycemia: Secondary | ICD-10-CM | POA: Diagnosis not present

## 2013-12-30 DIAGNOSIS — R2242 Localized swelling, mass and lump, left lower limb: Secondary | ICD-10-CM | POA: Diagnosis present

## 2013-12-30 DIAGNOSIS — Z720 Tobacco use: Secondary | ICD-10-CM | POA: Diagnosis not present

## 2013-12-30 DIAGNOSIS — L03116 Cellulitis of left lower limb: Secondary | ICD-10-CM | POA: Diagnosis not present

## 2013-12-30 DIAGNOSIS — I10 Essential (primary) hypertension: Secondary | ICD-10-CM | POA: Diagnosis not present

## 2013-12-30 DIAGNOSIS — Z794 Long term (current) use of insulin: Secondary | ICD-10-CM | POA: Insufficient documentation

## 2013-12-30 DIAGNOSIS — Z79899 Other long term (current) drug therapy: Secondary | ICD-10-CM | POA: Diagnosis not present

## 2013-12-30 DIAGNOSIS — M79672 Pain in left foot: Secondary | ICD-10-CM

## 2013-12-30 DIAGNOSIS — Z8659 Personal history of other mental and behavioral disorders: Secondary | ICD-10-CM | POA: Diagnosis not present

## 2013-12-30 DIAGNOSIS — Z792 Long term (current) use of antibiotics: Secondary | ICD-10-CM | POA: Diagnosis not present

## 2013-12-30 DIAGNOSIS — Z7982 Long term (current) use of aspirin: Secondary | ICD-10-CM | POA: Diagnosis not present

## 2013-12-30 DIAGNOSIS — R600 Localized edema: Secondary | ICD-10-CM

## 2013-12-30 LAB — BASIC METABOLIC PANEL
Anion gap: 10 (ref 5–15)
BUN: 21 mg/dL (ref 6–23)
CO2: 28 mEq/L (ref 19–32)
Calcium: 9.4 mg/dL (ref 8.4–10.5)
Chloride: 103 mEq/L (ref 96–112)
Creatinine, Ser: 1.06 mg/dL (ref 0.50–1.10)
GFR calc Af Amer: 64 mL/min — ABNORMAL LOW (ref >90)
GFR calc non Af Amer: 55 mL/min — ABNORMAL LOW (ref >90)
GLUCOSE: 234 mg/dL — AB (ref 70–99)
POTASSIUM: 4.1 meq/L (ref 3.7–5.3)
Sodium: 141 mEq/L (ref 137–147)

## 2013-12-30 LAB — CBC WITH DIFFERENTIAL/PLATELET
Basophils Absolute: 0 10*3/uL (ref 0.0–0.1)
Basophils Relative: 0 % (ref 0–1)
Eosinophils Absolute: 0.1 10*3/uL (ref 0.0–0.7)
Eosinophils Relative: 1 % (ref 0–5)
HCT: 35.6 % — ABNORMAL LOW (ref 36.0–46.0)
Hemoglobin: 11.9 g/dL — ABNORMAL LOW (ref 12.0–15.0)
LYMPHS ABS: 4.4 10*3/uL — AB (ref 0.7–4.0)
LYMPHS PCT: 44 % (ref 12–46)
MCH: 28.6 pg (ref 26.0–34.0)
MCHC: 33.4 g/dL (ref 30.0–36.0)
MCV: 85.6 fL (ref 78.0–100.0)
Monocytes Absolute: 0.5 10*3/uL (ref 0.1–1.0)
Monocytes Relative: 5 % (ref 3–12)
Neutro Abs: 5 10*3/uL (ref 1.7–7.7)
Neutrophils Relative %: 50 % (ref 43–77)
Platelets: 307 10*3/uL (ref 150–400)
RBC: 4.16 MIL/uL (ref 3.87–5.11)
RDW: 12 % (ref 11.5–15.5)
WBC: 10 10*3/uL (ref 4.0–10.5)

## 2013-12-30 LAB — CBG MONITORING, ED
GLUCOSE-CAPILLARY: 203 mg/dL — AB (ref 70–99)
Glucose-Capillary: 206 mg/dL — ABNORMAL HIGH (ref 70–99)

## 2013-12-30 MED ORDER — CLINDAMYCIN PHOSPHATE 600 MG/50ML IV SOLN
600.0000 mg | Freq: Once | INTRAVENOUS | Status: AC
  Administered 2013-12-30: 600 mg via INTRAVENOUS
  Filled 2013-12-30: qty 50

## 2013-12-30 MED ORDER — CLINDAMYCIN HCL 150 MG PO CAPS: 450.0000 mg | ORAL_CAPSULE | Freq: Three times a day (TID) | ORAL | Status: DC

## 2013-12-30 MED ORDER — SODIUM CHLORIDE 0.9 % IV BOLUS (SEPSIS)
1000.0000 mL | Freq: Once | INTRAVENOUS | Status: AC
  Administered 2013-12-30: 1000 mL via INTRAVENOUS

## 2013-12-30 NOTE — Discharge Instructions (Signed)

## 2013-12-30 NOTE — ED Notes (Signed)
The pt reports that her blood sugar has been high and the lt great toe is swollen and bruised with no pain for one week.  diabetic

## 2013-12-30 NOTE — ED Provider Notes (Signed)
CSN: [id number]     Arrival date & time 12/30/13  [id number] History  This chart was scribed for Ernestina Patches, MD by Peyton Bottoms, ED Scribe. This patient was seen in room D30C/D30C and the patient's care was started at 2:04 AM.   Chief Complaint  Patient presents with  . Hyperglycemia   Patient is a 63 y.o. female presenting with hyperglycemia. The history is provided by the patient. No language interpreter was used.  Hyperglycemia Blood sugar level PTA:  500 Severity:  Moderate Onset quality:  Gradual Duration:  1 week Timing:  Constant Progression:  Unchanged Chronicity:  New Relieved by:  Nothing Ineffective treatments:  Insulin Associated symptoms: no abdominal pain, no chest pain, no confusion, no diaphoresis, no dysuria, no fatigue, no fever, no increased thirst, no nausea, no polyuria, no shortness of breath and no vomiting     HPI Comments: Sara Davis is a 63 y.o. female with a history of diabetes and hypertension, who presents to the Emergency Department complaining of edema to left foot and erythema to left big toe. She states her blood sugar measured at 500 earlier today. She reports swelling of foot began after October 31st. She denies pain to left foot. She denies associated fevers or chills. She denies drainage from affected area. She states she took insulin earlier today.   Past Medical History  Diagnosis Date  . Hypertension   . Diabetes mellitus without complication   . Depression    Past Surgical History  Procedure Laterality Date  . Abdominal hysterectomy     Family History  Problem Relation Age of Onset  . Diabetes Mother   . Cancer Mother   . Diabetes Sister   . Hypertension Sister   . Cancer Sister 64    Bone  . Diabetes Brother    History  Substance Use Topics  . Smoking status: Current Every Day Smoker -- 0.50 packs/day for 40 years    Types: Cigarettes  . Smokeless tobacco: Never Used     Comment: down from 1.5 ppd  . Alcohol Use: No    OB History    No data available     Review of Systems  Constitutional: Negative for fever, chills, diaphoresis, activity change, appetite change and fatigue.  HENT: Negative for congestion, facial swelling, rhinorrhea and sore throat.   Eyes: Negative for photophobia and discharge.  Respiratory: Negative for cough, chest tightness and shortness of breath.   Cardiovascular: Positive for leg swelling. Negative for chest pain and palpitations.  Gastrointestinal: Negative for nausea, vomiting, abdominal pain and diarrhea.  Endocrine: Negative for polydipsia and polyuria.  Genitourinary: Negative for dysuria, frequency, difficulty urinating and pelvic pain.  Musculoskeletal: Negative for back pain, arthralgias, neck pain and neck stiffness.  Skin: Positive for color change. Negative for wound.  Allergic/Immunologic: Negative for immunocompromised state.  Neurological: Negative for facial asymmetry, weakness, numbness and headaches.  Hematological: Does not bruise/bleed easily.  Psychiatric/Behavioral: Negative for confusion and agitation.   Allergies  Aspirin  Home Medications   Prior to Admission medications   Medication Sig Start Date End Date Taking? Authorizing Provider  aspirin EC 81 MG tablet Take 81 mg by mouth daily.   Yes Historical Provider, MD  B-D ULTRAFINE III SHORT PEN 31G X 8 MM MISC CHECK BLOOD SUGARS ONCE IN MORNING BEFORE FOOD AND ONCE IN THE EVENING. 11/03/13  Yes Leone Brand, MD  Blood Glucose Monitoring Suppl (CONTOUR BLOOD GLUCOSE SYSTEM) DEVI Use to test blood sugars once in  the morning before food and once in the evening. DX: 250.02 07/05/12  Yes Cletus Gash, MD  glipiZIDE (GLUCOTROL) 10 MG tablet Take 1 tablet (10 mg total) by mouth daily with breakfast. 07/31/13  Yes Waldemar Dickens, MD  glucose blood (ONE TOUCH TEST STRIPS) test strip Please check blood sugar once daily in AM before eating and once daily in PM 06/28/12  Yes Cletus Gash, MD   insulin glargine (LANTUS) 100 UNIT/ML injection Inject 0.3 mLs (30 Units total) into the skin daily. 07/31/13  Yes Waldemar Dickens, MD  Lancets [facility] ULTRASOFT) lancets Please check blood sugar once daily in AM before eating and once daily in PM 06/28/12  Yes Cletus Gash, MD  lisinopril-hydrochlorothiazide (PRINZIDE,ZESTORETIC) 20-25 MG per tablet Take 1 tablet by mouth daily. 07/31/13  Yes Waldemar Dickens, MD  metFORMIN (GLUCOPHAGE) 1000 MG tablet Take 1 tablet (1,000 mg total) by mouth 2 (two) times daily with a meal. 07/31/13  Yes Waldemar Dickens, MD  aspirin 81 MG tablet Take 81 mg by mouth daily.    Historical Provider, MD  clindamycin (CLEOCIN) 150 MG capsule Take 3 capsules (450 mg total) by mouth 3 (three) times daily. 12/30/13   Ernestina Patches, MD  nicotine (NICODERM CQ - DOSED IN MG/24 HOURS) 21 mg/24hr patch Place 1 patch onto the skin daily. Patient not taking: Reported on 12/30/2013 09/21/12   Waldemar Dickens, MD   Triage Vitals: BP 154/71 mmHg  Pulse 91  Temp(Src) 98.6 F (37 C) (Oral)  Resp 18  Ht 5\' 6"  (1.676 m)  Wt 140 lb (63.504 kg)  BMI 22.61 kg/m2  SpO2 97%  Physical Exam  Constitutional: She is oriented to person, place, and time. She appears well-developed and well-nourished. No distress.  HENT:  Head: Normocephalic.  Mouth/Throat: Oropharynx is clear and moist.  Eyes: Pupils are equal, round, and reactive to light.  Neck: Neck supple.  Cardiovascular: Normal rate, regular rhythm and normal heart sounds.   Pulmonary/Chest: Effort normal and breath sounds normal. No respiratory distress. She has no wheezes.  Abdominal: Soft. She exhibits no distension. There is no tenderness. There is no rebound and no guarding.  Musculoskeletal: She exhibits no edema or tenderness.  Neurological: She is alert and oriented to person, place, and time.  Skin: Skin is warm and dry. There is erythema.  Erythema and warmth of the left foot without any overlying skin breakage.  No evidence of paronychia. 1+ DP. No posterior calf tenderness.  Psychiatric: She has a normal mood and affect.  Nursing note and vitals reviewed.  ED Course  Procedures (including critical care time)  DIAGNOSTIC STUDIES: Oxygen Saturation is 97% on RA, normal by my interpretation.    COORDINATION OF CARE: 2:16 AM- Discussed plans to obtain diagnostic imaging of left foot and lab work. Will give patient clindamycin and IV fluids. Pt advised of plan for treatment and pt agrees.  Labs Review Labs Reviewed  CBC WITH DIFFERENTIAL - Abnormal; Notable for the following:    Hemoglobin 11.9 (*)    HCT 35.6 (*)    Lymphs Abs 4.4 (*)    All other components within normal limits  BASIC METABOLIC PANEL - Abnormal; Notable for the following:    Glucose, Bld 234 (*)    GFR calc non Af Amer 55 (*)    GFR calc Af Amer 64 (*)    All other components within normal limits  CBG MONITORING, ED - Abnormal; Notable for the following:  Glucose-Capillary 206 (*)    All other components within normal limits   Imaging Review Dg Foot Complete Left  12/30/2013   CLINICAL DATA:  Acute onset of diffuse left foot swelling and erythema at the left great toe. Hyperglycemia, with history of diabetes and hypertension. Initial encounter.  EXAM: LEFT FOOT - COMPLETE 3+ VIEW  COMPARISON:  None.  FINDINGS: There is no evidence of fracture or dislocation. Mild degenerative change is noted at the fifth proximal phalangeal joint. There is no evidence of talar subluxation; the subtalar joint is unremarkable in appearance. Posterior and plantar calcaneal spurs are incidentally seen.  No significant soft tissue abnormalities are seen.  IMPRESSION: No evidence of fracture or dislocation.   Electronically Signed   By: Garald Balding M.D.   On: 12/30/2013 02:57     EKG Interpretation None     MDM   Final diagnoses:  Left foot pain  Edema of left foot  Cellulitis of left foot    Pt is a 63 y.o. female with Pmhx as  above who presents with about one week of worsening erythema and swelling of left foot.  She denies fevers or chills.  She states that she does not have pain in the foot.  She has no known injury of the foot.  She states that her blood sugar has also been high recently, though she took some insulin prior to arrival and it has since improved. Blood sugar here is 234.  Anion gap is normal.  X-ray of foot ordered to rule out foreign body.  Physical exam is consistent with a cellulitis of the left foot.  She has no calf tenderness, no shortness of breath, no DVT risk factors and I doubt DVT.Patient given 1 dose of IV clindamycin here.  Given that she has no fever, normal white count and is not toxic appearing.  I feel that she is safe for trial of outpatient treatment with by mouth clindamycin.  I will recommend she follow-up with her PCP in 2 days for a recheck.  Erythema will be marked.  Return precautions given for new or worsening symptoms including worsening redness, fever, red streaking.      I personally performed the services described in this documentation, which was scribed in my presence. The recorded information has been reviewed and is accurate.  Ernestina Patches, MD 12/30/13 [phone]

## 2013-12-30 NOTE — ED Notes (Signed)
Patient transported to X-ray 

## 2014-01-10 ENCOUNTER — Encounter: Payer: Self-pay | Admitting: Family Medicine

## 2014-01-10 ENCOUNTER — Ambulatory Visit (INDEPENDENT_AMBULATORY_CARE_PROVIDER_SITE_OTHER): Payer: BC Managed Care – PPO | Admitting: Family Medicine

## 2014-01-10 VITALS — BP 175/97 | HR 86 | Temp 98.1°F | Ht 66.0 in | Wt 153.0 lb

## 2014-01-10 DIAGNOSIS — E1165 Type 2 diabetes mellitus with hyperglycemia: Secondary | ICD-10-CM

## 2014-01-10 DIAGNOSIS — IMO0002 Reserved for concepts with insufficient information to code with codable children: Secondary | ICD-10-CM

## 2014-01-10 DIAGNOSIS — L03116 Cellulitis of left lower limb: Secondary | ICD-10-CM

## 2014-01-10 DIAGNOSIS — I10 Essential (primary) hypertension: Secondary | ICD-10-CM

## 2014-01-10 LAB — POCT GLYCOSYLATED HEMOGLOBIN (HGB A1C): Hemoglobin A1C: 9.9

## 2014-01-10 NOTE — Progress Notes (Signed)
   Subjective:    Patient ID: Sara Davis, female    DOB: 1950-03-15, 63 y.o.   MRN: [id number]  HPI  CC: ER follow up  # Left foot cellulitis:  Rx'd clindamycin TID, given 30 days worth and has been taking for past 10 days.  Causing her an upset stomach.  Swelling has gotten much better  Denies any pain around the big toe/area of primary swelling, but does have some pain around her medial ankle joint ROS: no fevers/chills, +nausea but no vomiting.  # Diabetes  Says she has not been doing as well with her diet lately  On glipizide, lantus, metformin; states she is compliant with medications.  CBG 295 this morning, took insulin  Denies any symptomatic highs and lows ROS: no change in urinary frequency, no dysuria.  # Hypertension  Did not take medications today ROS: no cp, no sob, no headache  Review of Systems   See HPI for ROS. All other systems reviewed and are negative.  Past medical history, surgical, family, and social history reviewed and updated in the EMR as appropriate. Objective:  BP 175/97 mmHg  Pulse 86  Temp(Src) 98.1 F (36.7 C) (Oral)  Ht 5\' 6"  (1.676 m)  Wt 153 lb (69.4 kg)  BMI 24.71 kg/m2 Vitals reviewed  General: NAD CV: RRR, normal s1s2, no mrg. 2+ radial, DP, PT pulses bilaterally Resp: CTAB, normal effort Ext: left foot mild erythema medial side of great toe, nontender, no fluctuance, minimal to no swelling. Diabetic foot testing sensation intact base of foot, decreased sensation toes on the left; normal testing on the right. Skin: erythema of left foot   Assessment & Plan:  See Problem List Documentation

## 2014-01-10 NOTE — Patient Instructions (Signed)
It was great seeing you today.   1. A1c: 9.9, this is improved from 12.4 in June. Keep up the good work! Work on cutting back on sugars as best you can 2. Foot: stop the clindamycin. Follow up in 1 week. If your foot gets more swollen, more red, the redness extends up the foot and leg, call us before this appointment.    If we did any lab work today, if it is normal you can expect a letter in the mail.  Please bring all your medications to every doctors visit  Sign up for My Chart to have easy access to your labs results, and communication with your Primary care physician.    Please check-out at the front desk before leaving the clinic.   I look forward to talking with you again at our next visit. If you have any questions or concerns before then, please call the clinic at [phone].  Take Care,   Dr. [name]

## 2014-01-12 DIAGNOSIS — L03116 Cellulitis of left lower limb: Secondary | ICD-10-CM | POA: Insufficient documentation

## 2014-01-12 NOTE — Assessment & Plan Note (Signed)
Seen in ED, x-rays showing no osteo, non-purulent, given clindamycin (30 day supply?). Has completed 10 days of antibiotics. Appears to be almost resolved. Plan: discontinue clindamycin. Return precautions for worsening infection. Asked to f/u in 1 week for re-eval.

## 2014-01-12 NOTE — Assessment & Plan Note (Signed)
Not controlled today, she reports not taking her medicine today.  Plan: f/u 1 week for re-check, told her the importance of taking this every day to make sure the doses or additional agent needing to be added.

## 2014-01-12 NOTE — Assessment & Plan Note (Signed)
Improved with A1c 9.9. Chart review shows possible non-compliance, though patient reports taking today. She expresses she wants to continue improving as she has some evidence of neuropathy of feet. Plan: continue current regimen, f/u 3 months.

## 2014-01-19 ENCOUNTER — Encounter: Payer: Self-pay | Admitting: Family Medicine

## 2014-01-19 ENCOUNTER — Ambulatory Visit (INDEPENDENT_AMBULATORY_CARE_PROVIDER_SITE_OTHER): Payer: BC Managed Care – PPO | Admitting: Family Medicine

## 2014-01-19 DIAGNOSIS — M79675 Pain in left toe(s): Secondary | ICD-10-CM | POA: Insufficient documentation

## 2014-01-19 LAB — URIC ACID: Uric Acid, Serum: 4.2 mg/dL (ref 2.4–7.0)

## 2014-01-19 NOTE — Progress Notes (Signed)
Subjective:     Patient ID: Sara Davis, female   DOB: 1950-07-28, 63 y.o.   MRN: [id number]  HPI  Left toe swelling: Patient here for follow up, had unprovoked left first toe swelling and pain which started about 2 wks ago, she was treated with A/B which she stopped last week, swelling and pain has improved a lot, but still pain especially when weight bearing. She has hx of gout in her siblings and feels this is what she has.  Current Outpatient Prescriptions on File Prior to Visit  Medication Sig Dispense Refill  . aspirin 81 MG tablet Take 81 mg by mouth daily.    Marland Kitchen glipiZIDE (GLUCOTROL) 10 MG tablet Take 1 tablet (10 mg total) by mouth daily with breakfast. 30 tablet 11  . insulin glargine (LANTUS) 100 UNIT/ML injection Inject 0.3 mLs (30 Units total) into the skin daily. 10 mL 12  . lisinopril-hydrochlorothiazide (PRINZIDE,ZESTORETIC) 20-25 MG per tablet Take 1 tablet by mouth daily. 30 tablet 11  . metFORMIN (GLUCOPHAGE) 1000 MG tablet Take 1 tablet (1,000 mg total) by mouth 2 (two) times daily with a meal. 60 tablet 11  . B-D ULTRAFINE III SHORT PEN 31G X 8 MM MISC CHECK BLOOD SUGARS ONCE IN MORNING BEFORE FOOD AND ONCE IN THE EVENING. 100 each 0  . Blood Glucose Monitoring Suppl (CONTOUR BLOOD GLUCOSE SYSTEM) DEVI Use to test blood sugars once in the morning before food and once in the evening. DX: 250.02 1 Device 0  . clindamycin (CLEOCIN) 150 MG capsule Take 3 capsules (450 mg total) by mouth 3 (three) times daily. (Patient not taking: Reported on 01/19/2014) 90 capsule 0  . glucose blood (ONE TOUCH TEST STRIPS) test strip Please check blood sugar once daily in AM before eating and once daily in PM 100 each 12  . Lancets (ONETOUCH ULTRASOFT) lancets Please check blood sugar once daily in AM before eating and once daily in PM 100 each 12  . nicotine (NICODERM CQ - DOSED IN MG/24 HOURS) 21 mg/24hr patch Place 1 patch onto the skin daily. (Patient not taking: Reported on 01/19/2014) 28  patch 0   No current facility-administered medications on file prior to visit.   Past Medical History  Diagnosis Date  . Hypertension   . Diabetes mellitus without complication   . Depression       Review of Systems  Respiratory: Negative.   Cardiovascular: Negative.   Gastrointestinal: Negative.   Musculoskeletal:       Toe pain  All other systems reviewed and are negative.  Filed Vitals:   01/19/14 0932  BP: 134/64  Temp: 97.9 F (36.6 C)  TempSrc: Oral  Weight: 154 lb (69.854 kg)       Objective:   Physical Exam  Constitutional: She appears well-developed. No distress.  Cardiovascular: Normal rate, regular rhythm and normal heart sounds.   No murmur heard. Pulmonary/Chest: Effort normal and breath sounds normal. No respiratory distress. She has no wheezes.  Abdominal: Soft.  Musculoskeletal:  Left great toe tenderness with palpation, no swelling or erythema.  Nursing note and vitals reviewed.      Assessment:     Left great toe pain     Plan:     Check problem list.

## 2014-01-19 NOTE — Patient Instructions (Signed)
Gout °Gout is when your joints become red, sore, and swell (inflamed). This is caused by the buildup of uric acid crystals in the joints. Uric acid is a chemical that is normally in the blood. If the level of uric acid gets too high in the blood, these crystals form in your joints and tissues. Over time, these crystals can form into masses near the joints and tissues. These masses can destroy bone and cause the bone to look misshapen (deformed). °HOME CARE  °· Do not take aspirin for pain. °· Only take medicine as told by your doctor. °· Rest the joint as much as you can. When in bed, keep sheets and blankets off painful areas. °· Keep the sore joints raised (elevated). °· Put warm or cold packs on painful joints. Use of warm or cold packs depends on which works best for you. °· Use crutches if the painful joint is in your leg. °· Drink enough fluids to keep your pee (urine) clear or pale yellow. Limit alcohol, sugary drinks, and drinks with fructose in them. °· Follow your diet instructions. Pay careful attention to how much protein you eat. Include fruits, vegetables, whole grains, and fat-free or low-fat milk products in your daily diet. Talk to your doctor or dietitian about the use of coffee, vitamin C, and cherries. These may help lower uric acid levels. °· Keep a healthy body weight. °GET HELP RIGHT AWAY IF:  °· You have watery poop (diarrhea), throw up (vomit), or have any side effects from medicines. °· You do not feel better in 24 hours, or you are getting worse. °· Your joint becomes suddenly more tender, and you have chills or a fever. °MAKE SURE YOU:  °· Understand these instructions. °· Will watch your condition. °· Will get help right away if you are not doing well or get worse. °Document Released: 10/29/2007 Document Revised: 06/05/2013 Document Reviewed: 09/02/2011 °[employer]® Patient Information ©2015 [employer]. This information is not intended to replace advice given to you by your health care  provider. Make sure you discuss any questions you have with your health care provider. ° °

## 2014-01-19 NOTE — Assessment & Plan Note (Signed)
S/P treatment for cellulitis. Symptoms improved. Uric acid checked to r/o gout. Patient informed since symptom is getting better even if it was gout uric acid might be normal. I recommended possible recheck uric acid if having recurrence. However if positive I will treat as gout. She agreed with plan.

## 2014-01-22 ENCOUNTER — Telehealth: Payer: Self-pay | Admitting: Family Medicine

## 2014-01-22 NOTE — Telephone Encounter (Signed)
Uric acid negative discussed with patient. Advised to return to clinic if having foot or toe swelling and pain. She agreed with plan.

## 2014-04-06 ENCOUNTER — Ambulatory Visit: Payer: 59 | Admitting: Family Medicine

## 2014-04-17 ENCOUNTER — Ambulatory Visit (INDEPENDENT_AMBULATORY_CARE_PROVIDER_SITE_OTHER): Payer: 59 | Admitting: Family Medicine

## 2014-04-17 ENCOUNTER — Encounter: Payer: Self-pay | Admitting: Family Medicine

## 2014-04-17 VITALS — BP 170/83 | HR 85 | Temp 98.9°F | Ht 66.0 in | Wt 161.7 lb

## 2014-04-17 DIAGNOSIS — IMO0002 Reserved for concepts with insufficient information to code with codable children: Secondary | ICD-10-CM

## 2014-04-17 DIAGNOSIS — E1165 Type 2 diabetes mellitus with hyperglycemia: Secondary | ICD-10-CM

## 2014-04-17 DIAGNOSIS — R51 Headache: Secondary | ICD-10-CM

## 2014-04-17 DIAGNOSIS — R519 Headache, unspecified: Secondary | ICD-10-CM

## 2014-04-17 LAB — POCT GLYCOSYLATED HEMOGLOBIN (HGB A1C): Hemoglobin A1C: 8.5

## 2014-04-17 NOTE — Patient Instructions (Signed)
We will give information mammogram and colonoscopy. Call the numbers on the sheets and schedule an appointment.  Schedule 1-2 months for yearly physical and pap smear.

## 2014-04-17 NOTE — Progress Notes (Signed)
   Subjective:    Patient ID: Sara Davis, female    DOB: May 16, 1950, 64 y.o.   MRN: [id number]  HPI  CC: headache  # Headache:  Past month, on and off  Left sided primarily  Wondering if she has a cold that she got over  No history of allergies  No rhinorrhea, no facial tenderness, no dizziness ROS: no numbness/tingling/weakness, no changes in vision  #  Diabetes  Cut out night time snacks. Eats 3 meals a day.   CBG monitoring: checking twice a day  CBG: this AM 262, normally runs around 100-120.  ROS: no CP no SOB  Review of Systems   See HPI for ROS. All other systems reviewed and are negative.  Past medical history, surgical, family, and social history reviewed and updated in the EMR as appropriate. Objective:  BP 170/83 mmHg  Pulse 85  Temp(Src) 98.9 F (37.2 C) (Oral)  Ht 5\' 6"  (1.676 m)  Wt 161 lb 11.2 oz (73.347 kg)  BMI 26.11 kg/m2 Vitals and nursing note reviewed  General: NAD HEENT: PERRL, EOMI. No rhinorrhea, no conjunctivitis, no sinus tenderness CV: RRR, normal heart sounds, no murmurs Resp: CTAB, normal effort Neuro: alert and oriented, CN grossly normal, no focal deficits.  Assessment & Plan:  See Problem List Documentation

## 2014-04-18 DIAGNOSIS — R51 Headache: Secondary | ICD-10-CM

## 2014-04-18 DIAGNOSIS — R519 Headache, unspecified: Secondary | ICD-10-CM | POA: Insufficient documentation

## 2014-04-18 NOTE — Assessment & Plan Note (Signed)
Improved A1c, now 8.5. Significant improvement from last June when it was 12. Plan: continue glipizide, metformin, CBG checking 1-2 times daily.

## 2014-04-18 NOTE — Assessment & Plan Note (Signed)
?  Tension type vs sinus (though nothing obvious on exam and no history of allergies). Discussed adequate hydration, good sleep hygiene, can continue OTC analgesics. If continues RTC.

## 2014-10-12 ENCOUNTER — Emergency Department (HOSPITAL_COMMUNITY): Payer: 59

## 2014-10-12 ENCOUNTER — Encounter (HOSPITAL_COMMUNITY): Payer: Self-pay | Admitting: Emergency Medicine

## 2014-10-12 ENCOUNTER — Inpatient Hospital Stay (HOSPITAL_COMMUNITY)
Admission: EM | Admit: 2014-10-12 | Discharge: 2014-10-17 | DRG: 637 | Disposition: A | Discharge status: Home / Self Care | Payer: 59 | Attending: Family Medicine | Admitting: Family Medicine

## 2014-10-12 DIAGNOSIS — I10 Essential (primary) hypertension: Secondary | ICD-10-CM

## 2014-10-12 DIAGNOSIS — E11628 Type 2 diabetes mellitus with other skin complications: Secondary | ICD-10-CM | POA: Diagnosis not present

## 2014-10-12 DIAGNOSIS — A419 Sepsis, unspecified organism: Secondary | ICD-10-CM

## 2014-10-12 DIAGNOSIS — L03116 Cellulitis of left lower limb: Secondary | ICD-10-CM | POA: Diagnosis not present

## 2014-10-12 DIAGNOSIS — E11621 Type 2 diabetes mellitus with foot ulcer: Secondary | ICD-10-CM | POA: Diagnosis not present

## 2014-10-12 DIAGNOSIS — W458XXA Other foreign body or object entering through skin, initial encounter: Secondary | ICD-10-CM | POA: Diagnosis present

## 2014-10-12 DIAGNOSIS — L089 Local infection of the skin and subcutaneous tissue, unspecified: Secondary | ICD-10-CM | POA: Diagnosis not present

## 2014-10-12 DIAGNOSIS — S99922A Unspecified injury of left foot, initial encounter: Secondary | ICD-10-CM | POA: Diagnosis not present

## 2014-10-12 DIAGNOSIS — Z23 Encounter for immunization: Secondary | ICD-10-CM

## 2014-10-12 DIAGNOSIS — Z7982 Long term (current) use of aspirin: Secondary | ICD-10-CM

## 2014-10-12 DIAGNOSIS — Z794 Long term (current) use of insulin: Secondary | ICD-10-CM

## 2014-10-12 DIAGNOSIS — IMO0002 Reserved for concepts with insufficient information to code with codable children: Secondary | ICD-10-CM

## 2014-10-12 DIAGNOSIS — IMO0001 Reserved for inherently not codable concepts without codable children: Secondary | ICD-10-CM | POA: Insufficient documentation

## 2014-10-12 DIAGNOSIS — L97529 Non-pressure chronic ulcer of other part of left foot with unspecified severity: Secondary | ICD-10-CM | POA: Diagnosis present

## 2014-10-12 DIAGNOSIS — L97509 Non-pressure chronic ulcer of other part of unspecified foot with unspecified severity: Secondary | ICD-10-CM

## 2014-10-12 DIAGNOSIS — E1169 Type 2 diabetes mellitus with other specified complication: Secondary | ICD-10-CM | POA: Diagnosis not present

## 2014-10-12 DIAGNOSIS — E1165 Type 2 diabetes mellitus with hyperglycemia: Secondary | ICD-10-CM | POA: Diagnosis present

## 2014-10-12 DIAGNOSIS — L03119 Cellulitis of unspecified part of limb: Secondary | ICD-10-CM | POA: Diagnosis not present

## 2014-10-12 DIAGNOSIS — F17210 Nicotine dependence, cigarettes, uncomplicated: Secondary | ICD-10-CM | POA: Diagnosis present

## 2014-10-12 LAB — BASIC METABOLIC PANEL
Anion gap: 9 (ref 5–15)
BUN: 10 mg/dL (ref 6–20)
CALCIUM: 8.9 mg/dL (ref 8.9–10.3)
CO2: 25 mmol/L (ref 22–32)
CREATININE: 1.06 mg/dL — AB (ref 0.44–1.00)
Chloride: 101 mmol/L (ref 101–111)
GFR calc Af Amer: >60 mL/min (ref >60)
GFR calc non Af Amer: 55 mL/min — ABNORMAL LOW (ref >60)
Glucose, Bld: 204 mg/dL — ABNORMAL HIGH (ref 65–99)
Potassium: 3.6 mmol/L (ref 3.5–5.1)
Sodium: 135 mmol/L (ref 135–145)

## 2014-10-12 LAB — CBC WITH DIFFERENTIAL/PLATELET
Basophils Absolute: 0 10*3/uL (ref 0.0–0.1)
Basophils Relative: 0 % (ref 0–1)
Eosinophils Absolute: 0.1 10*3/uL (ref 0.0–0.7)
Eosinophils Relative: 1 % (ref 0–5)
HEMATOCRIT: 33.5 % — AB (ref 36.0–46.0)
Hemoglobin: 11.2 g/dL — ABNORMAL LOW (ref 12.0–15.0)
Lymphocytes Relative: 18 % (ref 12–46)
Lymphs Abs: 2.6 10*3/uL (ref 0.7–4.0)
MCH: 28 pg (ref 26.0–34.0)
MCHC: 33.4 g/dL (ref 30.0–36.0)
MCV: 83.8 fL (ref 78.0–100.0)
MONO ABS: 0.9 10*3/uL (ref 0.1–1.0)
MONOS PCT: 6 % (ref 3–12)
NEUTROS ABS: 10.7 10*3/uL — AB (ref 1.7–7.7)
Neutrophils Relative %: 75 % (ref 43–77)
Platelets: 307 10*3/uL (ref 150–400)
RBC: 4 MIL/uL (ref 3.87–5.11)
RDW: 11.9 % (ref 11.5–15.5)
WBC: 14.2 10*3/uL — ABNORMAL HIGH (ref 4.0–10.5)

## 2014-10-12 LAB — CBG MONITORING, ED: Glucose-Capillary: 193 mg/dL — ABNORMAL HIGH (ref 65–99)

## 2014-10-12 MED ORDER — SODIUM CHLORIDE 0.9 % IV BOLUS (SEPSIS)
1000.0000 mL | Freq: Once | INTRAVENOUS | Status: AC
  Administered 2014-10-12: 1000 mL via INTRAVENOUS

## 2014-10-12 MED ORDER — MORPHINE SULFATE (PF) 4 MG/ML IV SOLN
4.0000 mg | Freq: Once | INTRAVENOUS | Status: AC
  Administered 2014-10-12: 4 mg via INTRAVENOUS
  Filled 2014-10-12: qty 1

## 2014-10-12 MED ORDER — CLINDAMYCIN PHOSPHATE 600 MG/50ML IV SOLN
600.0000 mg | Freq: Once | INTRAVENOUS | Status: AC
  Administered 2014-10-12: 600 mg via INTRAVENOUS
  Filled 2014-10-12: qty 50

## 2014-10-12 NOTE — ED Provider Notes (Signed)
CSN: [id number]     Arrival date & time 10/12/14  1958 History  This chart was scribed for non-physician practitioner, Clayton Bibles, [place] working with Gareth Morgan, MD by Tula Nakayama, ED scribe. This patient was seen in room TR09C/TR09C and the patient's care was started at 8:38 PM   Chief Complaint  Patient presents with  . Foot Injury   The history is provided by the patient. No language interpreter was used.   HPI Comments: Sara Davis is a 64 y.o. female with a history of HTN and DM who presents to the Emergency Department complaining of constant, moderate, gradually worsening, sharp and throbbing left foot pain that started 6 days ago. Pt states redness and swelling of her left foot and left lower leg that started today as associated symptoms. She also reports intermittent fever, chills and decreased appetite that started 6 days ago. Pt states that onset of pain started after she stepped on a thumb tack while wearing flip flops 6 days ago. She thinks she completely removed the tack. Pt's blood sugar measured approximately 500 yesterday. She denies generalized body aches, nausea, vomiting, diarrhea, cough, SOB and CP as associated symptoms.   Past Medical History  Diagnosis Date  . Hypertension   . Diabetes mellitus without complication   . Depression    Past Surgical History  Procedure Laterality Date  . Abdominal hysterectomy     Family History  Problem Relation Age of Onset  . Diabetes Mother   . Cancer Mother   . Diabetes Sister   . Hypertension Sister   . Cancer Sister 23    Bone  . Diabetes Brother    Social History  Substance Use Topics  . Smoking status: Current Every Day Smoker -- 0.50 packs/day for 40 years    Types: Cigarettes  . Smokeless tobacco: Never Used     Comment: down from 1.5 ppd  . Alcohol Use: No   OB History    No data available     Review of Systems  Constitutional: Positive for fever, chills and appetite change.  Respiratory: Negative  for shortness of breath.   Cardiovascular: Negative for chest pain.  Gastrointestinal: Negative for nausea, vomiting and abdominal pain.  Musculoskeletal: Positive for joint swelling and arthralgias. Negative for myalgias.  Skin: Positive for color change and wound.  Allergic/Immunologic: Positive for immunocompromised state.  Hematological: Does not bruise/bleed easily.  All other systems reviewed and are negative.     Allergies  Aspirin  Home Medications   Prior to Admission medications   Medication Sig Start Date End Date Taking? Authorizing Provider  aspirin 81 MG tablet Take 81 mg by mouth daily.    Historical Provider, MD  B-D ULTRAFINE III SHORT PEN 31G X 8 MM MISC CHECK BLOOD SUGARS ONCE IN MORNING BEFORE FOOD AND ONCE IN THE EVENING. 11/03/13   Leone Brand, MD  Blood Glucose Monitoring Suppl (CONTOUR BLOOD GLUCOSE SYSTEM) DEVI Use to test blood sugars once in the morning before food and once in the evening. DX: 250.02 07/05/12   Cletus Gash, MD  clindamycin (CLEOCIN) 150 MG capsule Take 3 capsules (450 mg total) by mouth 3 (three) times daily. Patient not taking: Reported on 01/19/2014 12/30/13   Ernestina Patches, MD  glipiZIDE (GLUCOTROL) 10 MG tablet Take 1 tablet (10 mg total) by mouth daily with breakfast. 07/31/13   Waldemar Dickens, MD  glucose blood (ONE TOUCH TEST STRIPS) test strip Please check blood sugar once daily in AM  before eating and once daily in PM 06/28/12   Cletus Gash, MD  insulin glargine (LANTUS) 100 UNIT/ML injection Inject 0.3 mLs (30 Units total) into the skin daily. 07/31/13   Waldemar Dickens, MD  Lancets [facility] ULTRASOFT) lancets Please check blood sugar once daily in AM before eating and once daily in PM 06/28/12   Cletus Gash, MD  lisinopril-hydrochlorothiazide (PRINZIDE,ZESTORETIC) 20-25 MG per tablet Take 1 tablet by mouth daily. 07/31/13   Waldemar Dickens, MD  metFORMIN (GLUCOPHAGE) 1000 MG tablet Take 1 tablet (1,000 mg total)  by mouth 2 (two) times daily with a meal. 07/31/13   Waldemar Dickens, MD  nicotine (NICODERM CQ - DOSED IN MG/24 HOURS) 21 mg/24hr patch Place 1 patch onto the skin daily. Patient not taking: Reported on 01/19/2014 09/21/12   Waldemar Dickens, MD   BP 168/70 mmHg  Pulse 100  Temp(Src) 100.2 F (37.9 C) (Oral)  Ht 5\' 6"  (1.676 m)  Wt 145 lb (65.772 kg)  BMI 23.41 kg/m2  SpO2 97% Physical Exam  Constitutional: She appears well-developed and well-nourished. No distress.  HENT:  Head: Normocephalic and atraumatic.  Neck: Neck supple.  Cardiovascular: Normal rate, regular rhythm and intact distal pulses.   Pulmonary/Chest: Effort normal and breath sounds normal. No respiratory distress. She has no wheezes. She has no rales.  Abdominal: Soft. She exhibits no distension. There is no tenderness. There is no rebound and no guarding.  Musculoskeletal:  Left foot with black eschar over the 5th MTP of the plantar aspect of the foot Entire foot is erythematous, edematous, warm and tender. Erythema, edema and tenderness extends lightly throughout the left lower leg.   Neurological: She is alert.  Skin: She is not diaphoretic.  Psychiatric: She has a normal mood and affect. Her behavior is normal.  Nursing note and vitals reviewed.   ED Course  Procedures   DIAGNOSTIC STUDIES: Oxygen Saturation is 97% on RA, normal by my interpretation.    COORDINATION OF CARE: 8:50 PM Discussed treatment plan with pt which includes an x-ray of her left foot and IV antibiotics. Pt agreed to plan.  10:28 PM Cellulitis is unchanged. Pt agrees with admission.    Labs Review Labs Reviewed  CBC WITH DIFFERENTIAL/PLATELET - Abnormal; Notable for the following:    WBC 14.2 (*)    Hemoglobin 11.2 (*)    HCT 33.5 (*)    Neutro Abs 10.7 (*)    All other components within normal limits  BASIC METABOLIC PANEL - Abnormal; Notable for the following:    Glucose, Bld 204 (*)    Creatinine, Ser 1.06 (*)    GFR calc  non Af Amer 55 (*)    All other components within normal limits  CBG MONITORING, ED - Abnormal; Notable for the following:    Glucose-Capillary 193 (*)    All other components within normal limits    Imaging Review Dg Foot Complete Left  10/12/2014   CLINICAL DATA:  Acute left foot pain and swelling after stepping on tack. Initial encounter.  EXAM: LEFT FOOT - COMPLETE 3+ VIEW  COMPARISON:  December 30, 2013.  FINDINGS: There is no evidence of fracture or dislocation. Joint spaces are intact. Mild spurring of posterior calcaneus is noted. Soft tissue ulceration is seen lateral to fifth proximal phalanx.  IMPRESSION: No fracture or dislocation is noted. Soft tissue ulceration is seen lateral to fifth proximal phalanx without underlying bony destruction   Electronically Signed   By: Sabino Dick  Brooke Bonito, M.D.   On: 10/12/2014 21:36     EKG Interpretation None       10:54 PM Admitted to [facility].    MDM   Final diagnoses:  Diabetic infection of left foot  Cellulitis of left lower leg    Febrile nontoxic patient with hx diabetes presents 5 days after finding thumb tack stuck in her foot with diabetic wound infection and cellulitis to the proximal lower leg.  Leukocytosis of 14.  Hyperglycemic to 204.  Clindamycin started in ED.  Admitted to [facility], patient's PCP.    I personally performed the services described in this documentation, which was scribed in my presence. The recorded information has been reviewed and is accurate.    [place], PA-C 10/13/14 [id number]  Gareth Morgan, MD 10/16/14 2030

## 2014-10-12 NOTE — H&P (Signed)
[facility] Admission History and Physical Service Pager: [phone]  Patient name: Sara Davis Medical record number: [id number] Date of birth: 26-Jun-1950 Age: 64 y.o. Gender: female  Primary Care Provider: [name], MD Consultants: none Code Status: full  Chief Complaint: foot infection  Assessment and Plan: MEKLIT COTTA is a 64 y.o. female presenting with a diabetic foot infection with surrounding cellulits. PMH is significant for uncontrolled DM, HTN.  Possible Sepsis Due to Diabetic foot infection - Patient with leukocytosis, HR of 100, and mild fever of 100.7 meeting minimal criteria for sepsis but overall appears very stable. - She was given 1 dose of IV clindamycin in the ED and 1L of IVF. - I will obtain a lactic acid, blood cultures. - Will assess glycemic control with A1c, check ESR, CRP, xray with no signs of bone involvement, check HIV, she has good pulses and I doubt need for vascular doppler.  IF ESR and CRP elevated may consider MRI to rule out osteo - She does not appear to be at high risk for MDR organisms , will change Abx to Ceftriaxone and Flagyl.  If she responds quickly can consider change to Augmentin for discharge. - Pain control with norco  Uncontrolled DM - Check A1c - Lantus 30 units -SSI-M  HTN -apparently not taking home Lisinopril-HCTZ, will monitor and restart as needed  FEN/GI: NS $RemoveBe'@100cc'CiXxFQchz$ /hr, Carb mod diet Prophylaxis: Lovenox  Disposition: Likely home after negative cultures and response to IV abx  History of Present Illness:  Sara Davis is a 64 y.o. female presenting with a diabetic foot infection.  She reports that 6 days ago she stepped on a tack while walking around in flip flops.  She did not seek immediate care for this but overall was not bothersome to her until the past day when it has become more painful, swollen and red.  She does report subjective fever and chills at home.  She notes compliance  with her home DM medications but has not been taking her BP pill.  She reports her home blood sugars have ranged from the 140s to 500s recently.  Besides her foot she otherwise feels well. She was seen in the ED fast tract and there was concern about cellulitis, she was given a dose of clindamycin and FPTS was called to admit to observation.  Review Of Systems:  Review of Systems  Constitutional: Positive for fever, chills and malaise/fatigue.  Eyes: Negative for blurred vision.  Respiratory: Negative for cough and shortness of breath.   Cardiovascular: Negative for chest pain and leg swelling.  Gastrointestinal: Negative for heartburn and abdominal pain.  Genitourinary: Negative for dysuria.  Skin: Negative for rash.  Neurological: Negative for dizziness and headaches.     Patient Active Problem List   Diagnosis Date Noted  . Cellulitis of left lower leg 10/12/2014  . Headache 04/18/2014  . Pain of left great toe 01/19/2014  . Insomnia 07/31/2013  . Diabetes mellitus out of control 01/14/2012  . HTN (hypertension) 01/14/2012  . Tobacco abuse 01/14/2012   Past Medical History: Past Medical History  Diagnosis Date  . Hypertension   . Diabetes mellitus without complication   . Depression    Past Surgical History: Past Surgical History  Procedure Laterality Date  . Abdominal hysterectomy     Social History: Social History  Substance Use Topics  . Smoking status: Current Every Day Smoker -- 0.50 packs/day for 40 years    Types: Cigarettes  .  Smokeless tobacco: Never Used     Comment: down from 1.5 ppd  . Alcohol Use: No   Additional social history:  Please also refer to relevant sections of EMR.  Family History: Family History  Problem Relation Age of Onset  . Diabetes Mother   . Cancer Mother   . Diabetes Sister   . Hypertension Sister   . Cancer Sister 40    Bone  . Diabetes Brother    Allergies and Medications: Allergies  Allergen Reactions  . Aspirin      Upset stomach   No current facility-administered medications on file prior to encounter.   Current Outpatient Prescriptions on File Prior to Encounter  Medication Sig Dispense Refill  . glipiZIDE (GLUCOTROL) 10 MG tablet Take 1 tablet (10 mg total) by mouth daily with breakfast. 30 tablet 11  . insulin glargine (LANTUS) 100 UNIT/ML injection Inject 0.3 mLs (30 Units total) into the skin daily. 10 mL 12  . metFORMIN (GLUCOPHAGE) 1000 MG tablet Take 1 tablet (1,000 mg total) by mouth 2 (two) times daily with a meal. 60 tablet 11  . B-D ULTRAFINE III SHORT PEN 31G X 8 MM MISC CHECK BLOOD SUGARS ONCE IN MORNING BEFORE FOOD AND ONCE IN THE EVENING. 100 each 0  . Blood Glucose Monitoring Suppl (CONTOUR BLOOD GLUCOSE SYSTEM) DEVI Use to test blood sugars once in the morning before food and once in the evening. DX: 250.02 1 Device 0  . clindamycin (CLEOCIN) 150 MG capsule Take 3 capsules (450 mg total) by mouth 3 (three) times daily. (Patient not taking: Reported on 01/19/2014) 90 capsule 0  . glucose blood (ONE TOUCH TEST STRIPS) test strip Please check blood sugar once daily in AM before eating and once daily in PM 100 each 12  . Lancets (ONETOUCH ULTRASOFT) lancets Please check blood sugar once daily in AM before eating and once daily in PM 100 each 12  . lisinopril-hydrochlorothiazide (PRINZIDE,ZESTORETIC) 20-25 MG per tablet Take 1 tablet by mouth daily. (Patient not taking: Reported on 10/12/2014) 30 tablet 11  . nicotine (NICODERM CQ - DOSED IN MG/24 HOURS) 21 mg/24hr patch Place 1 patch onto the skin daily. (Patient not taking: Reported on 01/19/2014) 28 patch 0    Objective: BP 167/71 mmHg  Pulse 81  Temp(Src) 100.7 F (38.2 C) (Oral)  Resp 17  Ht 5\' 6"  (1.676 m)  Wt 145 lb (65.772 kg)  BMI 23.41 kg/m2  SpO2 96% Exam: Physical Exam  Constitutional: She is oriented to person, place, and time and well-developed, well-nourished, and in no distress.  HENT:  Head: Normocephalic and  atraumatic.  Cardiovascular: Normal rate, regular rhythm, normal heart sounds and intact distal pulses.   2+ dp and pt pulses  Pulmonary/Chest: Effort normal and breath sounds normal. No respiratory distress. She has no wheezes.  Abdominal: Soft. Bowel sounds are normal. There is no tenderness.  Musculoskeletal: She exhibits no edema.  Neurological: She is alert and oriented to person, place, and time.  Skin: Skin is warm and dry.  1cm lesion on plantar aspect of left foot with covering eschar surrounding erythema that goes to midfoot  Nursing note and vitals reviewed.   Left foot   Labs and Imaging: CBC BMET   Recent Labs Lab 10/12/14 2125  WBC 14.2*  HGB 11.2*  HCT 33.5*  PLT 307    Recent Labs Lab 10/12/14 2125  NA 135  K 3.6  CL 101  CO2 25  BUN 10  CREATININE 1.06*  GLUCOSE 204*  CALCIUM 8.9       Lucious Groves, DO 10/12/2014, 11:57 PM PGY-3, [place] Intern pager: [phone], text pages welcome

## 2014-10-12 NOTE — ED Notes (Signed)
Pt. accidentally stepped on a thumb tack while using her slippers this week , reports pain and mild swelling.

## 2014-10-13 ENCOUNTER — Inpatient Hospital Stay (HOSPITAL_COMMUNITY): Payer: 59

## 2014-10-13 DIAGNOSIS — W458XXA Other foreign body or object entering through skin, initial encounter: Secondary | ICD-10-CM | POA: Diagnosis present

## 2014-10-13 DIAGNOSIS — L97529 Non-pressure chronic ulcer of other part of left foot with unspecified severity: Secondary | ICD-10-CM | POA: Diagnosis present

## 2014-10-13 DIAGNOSIS — E11628 Type 2 diabetes mellitus with other skin complications: Secondary | ICD-10-CM | POA: Diagnosis present

## 2014-10-13 DIAGNOSIS — S99922A Unspecified injury of left foot, initial encounter: Secondary | ICD-10-CM | POA: Diagnosis present

## 2014-10-13 DIAGNOSIS — L089 Local infection of the skin and subcutaneous tissue, unspecified: Secondary | ICD-10-CM | POA: Diagnosis not present

## 2014-10-13 DIAGNOSIS — L03119 Cellulitis of unspecified part of limb: Secondary | ICD-10-CM | POA: Diagnosis not present

## 2014-10-13 DIAGNOSIS — F17210 Nicotine dependence, cigarettes, uncomplicated: Secondary | ICD-10-CM | POA: Diagnosis present

## 2014-10-13 DIAGNOSIS — A419 Sepsis, unspecified organism: Secondary | ICD-10-CM | POA: Diagnosis present

## 2014-10-13 DIAGNOSIS — L03116 Cellulitis of left lower limb: Secondary | ICD-10-CM | POA: Diagnosis present

## 2014-10-13 DIAGNOSIS — IMO0001 Reserved for inherently not codable concepts without codable children: Secondary | ICD-10-CM | POA: Insufficient documentation

## 2014-10-13 DIAGNOSIS — Z23 Encounter for immunization: Secondary | ICD-10-CM | POA: Diagnosis not present

## 2014-10-13 DIAGNOSIS — E1165 Type 2 diabetes mellitus with hyperglycemia: Secondary | ICD-10-CM | POA: Diagnosis present

## 2014-10-13 DIAGNOSIS — Z7982 Long term (current) use of aspirin: Secondary | ICD-10-CM | POA: Diagnosis not present

## 2014-10-13 DIAGNOSIS — Z794 Long term (current) use of insulin: Secondary | ICD-10-CM | POA: Diagnosis not present

## 2014-10-13 DIAGNOSIS — E11621 Type 2 diabetes mellitus with foot ulcer: Secondary | ICD-10-CM | POA: Diagnosis present

## 2014-10-13 DIAGNOSIS — I10 Essential (primary) hypertension: Secondary | ICD-10-CM | POA: Diagnosis present

## 2014-10-13 DIAGNOSIS — E1169 Type 2 diabetes mellitus with other specified complication: Secondary | ICD-10-CM | POA: Diagnosis not present

## 2014-10-13 LAB — SEDIMENTATION RATE

## 2014-10-13 LAB — GLUCOSE, CAPILLARY
GLUCOSE-CAPILLARY: 79 mg/dL (ref 65–99)
GLUCOSE-CAPILLARY: 150 mg/dL — AB (ref 65–99)
GLUCOSE-CAPILLARY: 106 mg/dL — AB (ref 65–99)

## 2014-10-13 LAB — CBC
HCT: 23.5 % — ABNORMAL LOW (ref 36.0–46.0)
HEMOGLOBIN: 8.4 g/dL — AB (ref 12.0–15.0)
MCH: 28.1 pg (ref 26.0–34.0)
MCHC: 33.6 g/dL (ref 30.0–36.0)
MCV: 83.6 fL (ref 78.0–100.0)
PLATELETS: 277 10*3/uL (ref 150–400)
RBC: 2.81 MIL/uL — ABNORMAL LOW (ref 3.87–5.11)
RDW: 11.8 % (ref 11.5–15.5)
WBC: 13.1 10*3/uL — ABNORMAL HIGH (ref 4.0–10.5)

## 2014-10-13 LAB — HIV ANTIBODY (ROUTINE TESTING W REFLEX): HIV Screen 4th Generation wRfx: NONREACTIVE

## 2014-10-13 LAB — C-REACTIVE PROTEIN: CRP: 12.3 mg/dL — AB (ref <1.0)

## 2014-10-13 LAB — PREALBUMIN: PREALBUMIN: 10.1 mg/dL — AB (ref 18–38)

## 2014-10-13 LAB — LACTIC ACID, PLASMA: LACTIC ACID, VENOUS: 1 mmol/L (ref 0.5–2.0)

## 2014-10-13 MED ORDER — INSULIN ASPART 100 UNIT/ML ~~LOC~~ SOLN
0.0000 [IU] | Freq: Every day | SUBCUTANEOUS | Status: DC
  Administered 2014-10-15: 2 [IU] via SUBCUTANEOUS
  Administered 2014-10-16: 3 [IU] via SUBCUTANEOUS

## 2014-10-13 MED ORDER — HYDROMORPHONE HCL 1 MG/ML IJ SOLN: 0.5000 mg | INTRAMUSCULAR | Status: AC | PRN

## 2014-10-13 MED ORDER — ACETAMINOPHEN 325 MG PO TABS: 650.0000 mg | ORAL_TABLET | Freq: Four times a day (QID) | ORAL | Status: DC | PRN

## 2014-10-13 MED ORDER — ENOXAPARIN SODIUM 40 MG/0.4ML ~~LOC~~ SOLN
40.0000 mg | SUBCUTANEOUS | Status: DC
  Administered 2014-10-13 – 2014-10-17 (×5): 40 mg via SUBCUTANEOUS
  Filled 2014-10-13 (×5): qty 0.4

## 2014-10-13 MED ORDER — CEFTRIAXONE SODIUM 2 G IJ SOLR
2.0000 g | INTRAMUSCULAR | Status: DC
  Administered 2014-10-13 – 2014-10-14 (×2): 2 g via INTRAVENOUS
  Filled 2014-10-13 (×3): qty 2

## 2014-10-13 MED ORDER — ONDANSETRON HCL 4 MG/2ML IJ SOLN: 4.0000 mg | Freq: Three times a day (TID) | INTRAMUSCULAR | Status: AC | PRN

## 2014-10-13 MED ORDER — GADOBENATE DIMEGLUMINE 529 MG/ML IV SOLN
13.0000 mL | Freq: Once | INTRAVENOUS | Status: AC | PRN
  Administered 2014-10-13: 13 mL via INTRAVENOUS

## 2014-10-13 MED ORDER — INSULIN ASPART 100 UNIT/ML ~~LOC~~ SOLN
0.0000 [IU] | Freq: Three times a day (TID) | SUBCUTANEOUS | Status: DC
  Administered 2014-10-13: 2 [IU] via SUBCUTANEOUS
  Administered 2014-10-15: 3 [IU] via SUBCUTANEOUS
  Administered 2014-10-15: 5 [IU] via SUBCUTANEOUS
  Administered 2014-10-16: 8 [IU] via SUBCUTANEOUS
  Administered 2014-10-16 (×2): 3 [IU] via SUBCUTANEOUS
  Administered 2014-10-17: 8 [IU] via SUBCUTANEOUS

## 2014-10-13 MED ORDER — ACETAMINOPHEN 650 MG RE SUPP: 650.0000 mg | Freq: Four times a day (QID) | RECTAL | Status: DC | PRN

## 2014-10-13 MED ORDER — HYDROCODONE-ACETAMINOPHEN 5-325 MG PO TABS
1.0000 | ORAL_TABLET | ORAL | Status: DC | PRN
  Administered 2014-10-13 – 2014-10-15 (×8): 2 via ORAL
  Filled 2014-10-13 (×8): qty 2

## 2014-10-13 MED ORDER — METRONIDAZOLE 500 MG PO TABS
500.0000 mg | ORAL_TABLET | Freq: Three times a day (TID) | ORAL | Status: DC
  Administered 2014-10-13 – 2014-10-14 (×4): 500 mg via ORAL
  Filled 2014-10-13 (×5): qty 1

## 2014-10-13 MED ORDER — INSULIN GLARGINE 100 UNIT/ML ~~LOC~~ SOLN
30.0000 [IU] | Freq: Every day | SUBCUTANEOUS | Status: DC
  Administered 2014-10-13 – 2014-10-16 (×4): 30 [IU] via SUBCUTANEOUS
  Filled 2014-10-13 (×7): qty 0.3

## 2014-10-13 MED ORDER — SODIUM CHLORIDE 0.9 % IV SOLN
INTRAVENOUS | Status: AC
  Administered 2014-10-13: 02:00:00 via INTRAVENOUS

## 2014-10-13 NOTE — Care Management (Signed)
Utilization review completed. Aniesha Haughn, RN Case Manager [phone]. 

## 2014-10-13 NOTE — Progress Notes (Signed)
Family Medicine Teaching Service Daily Progress Note Intern Pager: [phone]  Patient name: Sara Davis Medical record number: [id number] Date of birth: [birth year] Age: 64 y.o. Gender: female  Primary Care Provider: [name], MD Consultants: None Code Status: FULL  Pt Overview and Major Events to Date:  9/9>> Admitted for possible sepsis 2/2 diabetic foot infection 9/10>> MRI left foot ordered to rule out osteo  Assessment and Plan:  Possible Sepsis Due to Diabetic Foot Infection, Concern for Osteomyelitis: Patient meets minimal sepsis criteria with fever of 100.9 and WBC count 13.1. She appears very stable. Lactic acid normal. Blood cultures pending. On exam, her left foot ulceration is deep plus her ESR and CRP are significantly elevated which is concerning for osteomyelitis. X-ray of the left foot did not show evidence of osteo, but MRI is more sensitive. Last tdap in October 2014. Will continue current antibiotics, get MRI, and adjust antibiotics as necessary based on the results. - MRI Left Foot ordered, results pending - Continue Ceftriaxone and Flagyl - Continue Norco 5-325 mg 1-2 tablets Q4H PRN - f/u blood cultures  Uncontrolled Type 2 DM: Last HbA1c 8.5 in March 2016. Patient takes Glipizide 10 mg daily, Metformin 1000 mg BID, and Lantus 30 units daily ar home.  - Hold oral meds - Continue Lantus 30 units daily - Continue moderate ISS - CBGs 4 times daily  HTN: BP 121/54. Patient apparently not taking home Lisinopril-HCTZ.  - Will hold off on restarting as BP stable and in setting of possible sepsis   Diet: Carb modified  VTE PPx: Lovenox SQ  Disposition: Discharge in next 1-2 days  Subjective:  Patient reports minimal pain in her left foot, but otherwise feeling fine. She had a fever this AM of 100.9.   Objective: Temp:  [98.8 F (37.1 C)-100.9 F (38.3 C)] 100.9 F (38.3 C) (09/10 0531) Pulse Rate:  [78-100] 78 (09/10 0531) Resp:  [16-17] 16 (09/10  0531) BP: (121-168)/(54-72) 121/54 mmHg (09/10 0531) SpO2:  [96 %-100 %] 97 % (09/10 0531) Weight:  [145 lb (65.772 kg)] 145 lb (65.772 kg) (09/09 2025) Physical Exam: General: alert, resting in bed, NAD Cardiovascular: RRR, no m/g/r Respiratory: CTA bilaterally, breaths non-labored Abdomen: BS+, soft, non-tender, non-distended Extremities: warm, well perfused. Left foot has a deep appearing ulceration on the lateral plantar aspect of the foot. Left foot is significantly swollen compared to right. There is tenderness to palpation around the ulcer. No active drainage.   Laboratory:  Recent Labs Lab 10/12/14 2125 10/13/14 0236  WBC 14.2* 13.1*  HGB 11.2* 8.4*  HCT 33.5* 23.5*  PLT 307 277    Recent Labs Lab 10/12/14 2125  NA 135  K 3.6  CL 101  CO2 25  BUN 10  CREATININE 1.06*  CALCIUM 8.9  GLUCOSE 204*     Imaging/Diagnostic Tests: X-ray Left Foot 9/9>> soft tissue ulceration lateral to 5th proximal phalanx without underlying bony destruction  MRI Left Foot 9/10>> read pending   Juliet Rude, MD 10/13/2014, 1:55 PM PGY-II, [facility] FPTS Intern pager: [phone], text pages welcome

## 2014-10-14 DIAGNOSIS — E11621 Type 2 diabetes mellitus with foot ulcer: Secondary | ICD-10-CM

## 2014-10-14 DIAGNOSIS — L97509 Non-pressure chronic ulcer of other part of unspecified foot with unspecified severity: Secondary | ICD-10-CM

## 2014-10-14 LAB — GLUCOSE, CAPILLARY
GLUCOSE-CAPILLARY: 103 mg/dL — AB (ref 65–99)
GLUCOSE-CAPILLARY: 109 mg/dL — AB (ref 65–99)
Glucose-Capillary: 70 mg/dL (ref 65–99)
Glucose-Capillary: 76 mg/dL (ref 65–99)

## 2014-10-14 LAB — CBC
HCT: 28.4 % — ABNORMAL LOW (ref 36.0–46.0)
Hemoglobin: 9.6 g/dL — ABNORMAL LOW (ref 12.0–15.0)
MCH: 28.2 pg (ref 26.0–34.0)
MCHC: 33.8 g/dL (ref 30.0–36.0)
MCV: 83.5 fL (ref 78.0–100.0)
PLATELETS: 314 10*3/uL (ref 150–400)
RBC: 3.4 MIL/uL — AB (ref 3.87–5.11)
RDW: 12.1 % (ref 11.5–15.5)
WBC: 13.6 10*3/uL — AB (ref 4.0–10.5)

## 2014-10-14 LAB — BASIC METABOLIC PANEL
ANION GAP: 8 (ref 5–15)
BUN: 5 mg/dL — ABNORMAL LOW (ref 6–20)
CO2: 24 mmol/L (ref 22–32)
Calcium: 8.6 mg/dL — ABNORMAL LOW (ref 8.9–10.3)
Chloride: 107 mmol/L (ref 101–111)
Creatinine, Ser: 0.87 mg/dL (ref 0.44–1.00)
GFR calc Af Amer: >60 mL/min (ref >60)
GFR calc non Af Amer: >60 mL/min (ref >60)
GLUCOSE: 67 mg/dL (ref 65–99)
POTASSIUM: 3.1 mmol/L — AB (ref 3.5–5.1)
SODIUM: 139 mmol/L (ref 135–145)

## 2014-10-14 MED ORDER — LISINOPRIL-HYDROCHLOROTHIAZIDE 20-25 MG PO TABS: 1.0000 | ORAL_TABLET | Freq: Every day | ORAL | Status: DC

## 2014-10-14 MED ORDER — POTASSIUM CHLORIDE CRYS ER 20 MEQ PO TBCR
40.0000 meq | EXTENDED_RELEASE_TABLET | Freq: Once | ORAL | Status: AC
  Administered 2014-10-14: 40 meq via ORAL
  Filled 2014-10-14: qty 2

## 2014-10-14 MED ORDER — LISINOPRIL 20 MG PO TABS
20.0000 mg | ORAL_TABLET | Freq: Every day | ORAL | Status: DC
  Administered 2014-10-14 – 2014-10-17 (×4): 20 mg via ORAL
  Filled 2014-10-14 (×4): qty 1

## 2014-10-14 MED ORDER — LEVOFLOXACIN IN D5W 750 MG/150ML IV SOLN
750.0000 mg | INTRAVENOUS | Status: DC
  Administered 2014-10-14: 750 mg via INTRAVENOUS
  Filled 2014-10-14 (×2): qty 150

## 2014-10-14 MED ORDER — DOXYCYCLINE HYCLATE 100 MG PO TABS
100.0000 mg | ORAL_TABLET | Freq: Two times a day (BID) | ORAL | Status: DC
  Administered 2014-10-14 – 2014-10-17 (×7): 100 mg via ORAL
  Filled 2014-10-14 (×7): qty 1

## 2014-10-14 MED ORDER — HYDROCHLOROTHIAZIDE 25 MG PO TABS
25.0000 mg | ORAL_TABLET | Freq: Every day | ORAL | Status: DC
  Administered 2014-10-14 – 2014-10-17 (×4): 25 mg via ORAL
  Filled 2014-10-14 (×4): qty 1

## 2014-10-14 NOTE — Progress Notes (Signed)
Interval Note   Patient presenting with diabetic foot ulcer. Patient states pain is well controlled, was 5/10 without medication, resolved now. Frustrated as she religiously checks her feet.   Blood pressure 131/58, pulse 82, temperature 99.7 F (37.6 C), temperature source Oral, resp. rate 17, height 5\' 6"  (1.676 m), weight 145 lb (65.772 kg), SpO2 100 %. General: Lying in bed in NAD. Non-toxic Cardiovascular: RRR. No murmurs, rubs, or gallops noted. No pitting edema noted on the R leg, 1+ pitting edema noted on the L foot, most prominent on the dorsal aspect of the foot.  Respiratory: No increased WOB. CTAB without wheezing, rhonchi, or crackles noted. Skin: L foot with a deep ulceration and black eschar noted on the lateral planter aspect of the foot. The left foot is edematous, erythematous, and warm to touch.    Diabetic foot ulcer: No osteo noted on MRI. Leukocytosis persisted with continued swelling on ceftriaxone and flagyl, therefore IV levaquin (renally dosed) and PO doxycycline were started today. Afebrile for over 24hrs.  - continue levaquin and doxycycline. - consider transition to PO levaquin on 9/13 if swelling/erythemat improves  - continue to follow blood cultures  Archie Patten, MD [facility] Family Medicine Resident  10/14/2014, 8:19 PM

## 2014-10-14 NOTE — Progress Notes (Signed)
States since her new antibiotics - "I am amazed, my foot feels so much better...Marland Kitchen"

## 2014-10-14 NOTE — Progress Notes (Signed)
Family Medicine Teaching Service Daily Progress Note Intern Pager: [phone]  Patient name: Sara Davis Medical record number: [id number] Date of birth: March 29, 1950 Age: 64 y.o. Gender: female  Primary Care Provider: [name], MD Consultants: None Code Status: FULL  Pt Overview and Major Events to Date:  9/9>> Admitted for possible sepsis 2/2 diabetic foot infection 9/10>> MRI left foot neg for osteo/abscess  Assessment and Plan:  Diabetic Foot Infection: Patient afebrile for 24 hours now. No longer tachycardic. WBC count has increased slightly from 13.1 to 13.6. MRI left foot negative for osteo/abscess. Foot does not appear any better, still significantly swollen, erythematous, and tender. Will change antibiotic therapy. Continue with pain control.  - Discontinue Ceftriaxone and Flagyl - Switch to IV Levaquin per pharmacy (renally dosed)- anaerobe and pseudomonas coverage - Switch to Doxycycline 100 mg BID- MRSA coverage - Continue Norco 5-325 mg 1-2 tablets Q4H PRN - f/u blood cultures  Uncontrolled Type 2 DM: Last HbA1c 8.5 in March 2016. Patient takes Glipizide 10 mg daily, Metformin 1000 mg BID, and Lantus 30 units daily ar home. CBGs well controlled in 70s-150s.  - Hold oral meds - Continue Lantus 30 units daily - Continue moderate ISS - f/u HbA1c - CBGs 4 times daily  HTN: BP has remained mildly elevated in [id number] systolic overnight. Higher BPs likely secondary to pain. Will restart home Lisinopril-HCTZ.  - Restart Lisinopril-HCTZ 2--25 mg daily    Diet: Carb modified  VTE PPx: Lovenox SQ  Disposition: Discharge in next 1-2 days  Subjective:  Has remained afebrile for 24 hours. Patient reports pain is currently 5/10, increases to 9/10 when walking or putting pressure on left foot. Denies any fevers, chills, nausea, vomiting.   Objective: Temp:  [99 F (37.2 C)-99.9 F (37.7 C)] 99 F (37.2 C) (09/11 0459) Pulse Rate:  [71-85] 71 (09/11 0459) Resp:   [16-19] 16 (09/11 0459) BP: (152-178)/(67-81) 152/74 mmHg (09/11 0459) SpO2:  [98 %-99 %] 99 % (09/11 0459) Physical Exam: General: alert, lying in bed, NAD Cardiovascular: RRR, no m/g/r Respiratory: CTA bilaterally, breaths non-labored Abdomen: BS+, soft, non-tender, non-distended Extremities: warm, well perfused. Left foot has a deep appearing ulceration on the lateral plantar aspect of the foot. Left foot is still significantly swollen and erythematous compared to right. There is tenderness to palpation around the ulcer. No active drainage.   Laboratory:  Recent Labs Lab 10/12/14 2125 10/13/14 0236 10/14/14 0540  WBC 14.2* 13.1* 13.6*  HGB 11.2* 8.4* 9.6*  HCT 33.5* 23.5* 28.4*  PLT 307 277 314    Recent Labs Lab 10/12/14 2125 10/14/14 0540  NA 135 139  K 3.6 3.1*  CL 101 107  CO2 25 24  BUN 10 5*  CREATININE 1.06* 0.87  CALCIUM 8.9 8.6*  GLUCOSE 204* 67     Imaging/Diagnostic Tests: X-ray Left Foot 9/9>> soft tissue ulceration lateral to 5th proximal phalanx without underlying bony destruction   MRI Left Foot 9/10>> no evidence of osteo/abscess. Necrotic tissue plantar to the fifth MTP joint with ulceration or laceration. Edema and enhancement plantar to the first MTP joint may represent a second site of trauma or an ulceration in this diabetic patient. Diffuse cellulitis of the forefoot.   Juliet Rude, MD 10/14/2014, 7:39 AM PGY-II, [facility] FPTS Intern pager: [phone], text pages welcome

## 2014-10-14 NOTE — Progress Notes (Signed)
ANTIBIOTIC CONSULT NOTE - INITIAL  Pharmacy Consult for Levofloxacin Indication: diabetic foot infection  Allergies  Allergen Reactions  . Aspirin     Upset stomach    Patient Measurements: Height: 5\' 6"  (167.6 cm) Weight: 145 lb (65.772 kg) IBW/kg (Calculated) : 59.3   Vital Signs: Temp: 99 F (37.2 C) (09/11 0459) Temp Source: Oral (09/11 0459) BP: 152/74 mmHg (09/11 0459) Pulse Rate: 71 (09/11 0459) Intake/Output from previous day: 09/10 0701 - 09/11 0700 In: 240 [P.O.:240] Out: -  Intake/Output from this shift:    Labs:  Recent Labs  10/12/14 2125 10/13/14 0236 10/14/14 0540  WBC 14.2* 13.1* 13.6*  HGB 11.2* 8.4* 9.6*  PLT 307 277 314  CREATININE 1.06*  --  0.87   Estimated Creatinine Clearance: 62.8 mL/min (by C-G formula based on Cr of 0.87). No results for input(s): VANCOTROUGH, VANCOPEAK, VANCORANDOM, GENTTROUGH, GENTPEAK, GENTRANDOM, TOBRATROUGH, TOBRAPEAK, TOBRARND, AMIKACINPEAK, AMIKACINTROU, AMIKACIN in the last 72 hours.   Microbiology: No results found for this or any previous visit (from the past 720 hour(s)).  Medical History: Past Medical History  Diagnosis Date  . Hypertension   . Diabetes mellitus without complication   . Depression     Medications:  Scheduled:  . doxycycline  100 mg Oral Q12H  . enoxaparin (LOVENOX) injection  40 mg Subcutaneous Q24H  . lisinopril  20 mg Oral Daily   And  . hydrochlorothiazide  25 mg Oral Daily  . insulin aspart  0-15 Units Subcutaneous TID WC  . insulin aspart  0-5 Units Subcutaneous QHS  . insulin glargine  30 Units Subcutaneous QHS   Assessment: 64 YO female admitted 9/9 for possible sepsis 2/2 diabetic foot infection after stepping on a tack 6 days prior. MRI on 9/10 was negative for osteo or abscess. Also on doxycycline. WBC 13.6, afebrile, SCr 0.86 with an estimated CrCl of 62.8 ml/min.  Clindamycin 9/9 x1 Ceftriaxone 9/10 > 9/11 Flagyl 9/10 > 9/11 Doxycycline 9/11 >> Levofloxacin  9/11 >>  9/10 Blood cx x2: sent   Plan:  Start levofloxacin 750 mg IV q24h Monitor renal function, clinical progression, cultures, and LOT  Joya San, PharmD Clinical Pharmacy Resident Pager # [phone] 10/14/2014 1:11 PM

## 2014-10-15 ENCOUNTER — Telehealth: Payer: Self-pay | Admitting: *Deleted

## 2014-10-15 DIAGNOSIS — L03116 Cellulitis of left lower limb: Secondary | ICD-10-CM | POA: Insufficient documentation

## 2014-10-15 LAB — GLUCOSE, CAPILLARY
GLUCOSE-CAPILLARY: 93 mg/dL (ref 65–99)
GLUCOSE-CAPILLARY: 233 mg/dL — AB (ref 65–99)
Glucose-Capillary: 164 mg/dL — ABNORMAL HIGH (ref 65–99)
Glucose-Capillary: 237 mg/dL — ABNORMAL HIGH (ref 65–99)

## 2014-10-15 LAB — BASIC METABOLIC PANEL
Anion gap: 9 (ref 5–15)
BUN: 7 mg/dL (ref 6–20)
CHLORIDE: 103 mmol/L (ref 101–111)
CO2: 25 mmol/L (ref 22–32)
CREATININE: 1.06 mg/dL — AB (ref 0.44–1.00)
Calcium: 8.7 mg/dL — ABNORMAL LOW (ref 8.9–10.3)
GFR calc non Af Amer: 55 mL/min — ABNORMAL LOW (ref >60)
Glucose, Bld: 85 mg/dL (ref 65–99)
POTASSIUM: 3.7 mmol/L (ref 3.5–5.1)
SODIUM: 137 mmol/L (ref 135–145)

## 2014-10-15 LAB — CBC
HCT: 29.8 % — ABNORMAL LOW (ref 36.0–46.0)
HEMOGLOBIN: 9.8 g/dL — AB (ref 12.0–15.0)
MCH: 27.5 pg (ref 26.0–34.0)
MCHC: 32.9 g/dL (ref 30.0–36.0)
MCV: 83.5 fL (ref 78.0–100.0)
Platelets: 357 10*3/uL (ref 150–400)
RBC: 3.57 MIL/uL — AB (ref 3.87–5.11)
RDW: 12 % (ref 11.5–15.5)
WBC: 13.3 10*3/uL — AB (ref 4.0–10.5)

## 2014-10-15 LAB — HEMOGLOBIN A1C
HEMOGLOBIN A1C: 10.1 % — AB (ref 4.8–5.6)
Mean Plasma Glucose: 243 mg/dL

## 2014-10-15 MED ORDER — TETANUS IMMUNE GLOBULIN 250 UNIT/ML IM INJ
250.0000 [IU] | INJECTION | Freq: Once | INTRAMUSCULAR | Status: AC
  Administered 2014-10-15: 250 [IU] via INTRAMUSCULAR
  Filled 2014-10-15: qty 1

## 2014-10-15 MED ORDER — DEXTROSE 5 % IV SOLN
1.0000 g | Freq: Three times a day (TID) | INTRAVENOUS | Status: DC
  Administered 2014-10-15 – 2014-10-16 (×3): 1 g via INTRAVENOUS
  Filled 2014-10-15 (×5): qty 1

## 2014-10-15 MED ORDER — DEXTROSE 5 % IV SOLN
2.0000 g | Freq: Three times a day (TID) | INTRAVENOUS | Status: DC
  Filled 2014-10-15 (×2): qty 2

## 2014-10-15 NOTE — Progress Notes (Signed)
ANTIBIOTIC CONSULT NOTE - FOLLOW UP  Pharmacy Consult for Ceftazidime Indication: Diabetic foot infection  Allergies  Allergen Reactions  . Aspirin     Upset stomach    Patient Measurements: Height: 5\' 6"  (167.6 cm) Weight: 145 lb (65.772 kg) IBW/kg (Calculated) : 59.3  Vital Signs: Temp: 98.6 F (37 C) (09/12 0529) Temp Source: Oral (09/12 0529) BP: 132/65 mmHg (09/12 0529) Pulse Rate: 73 (09/12 0529) Intake/Output from previous day:   Intake/Output from this shift: Total I/O In: 240 [P.O.:240] Out: -   Labs:  Recent Labs  10/12/14 2125 10/13/14 0236 10/14/14 0540 10/15/14 0436  WBC 14.2* 13.1* 13.6* 13.3*  HGB 11.2* 8.4* 9.6* 9.8*  PLT 307 277 314 357  CREATININE 1.06*  --  0.87 1.06*   Estimated Creatinine Clearance: 51.5 mL/min (by C-G formula based on Cr of 1.06). No results for input(s): VANCOTROUGH, VANCOPEAK, VANCORANDOM, GENTTROUGH, GENTPEAK, GENTRANDOM, TOBRATROUGH, TOBRAPEAK, TOBRARND, AMIKACINPEAK, AMIKACINTROU, AMIKACIN in the last 72 hours.   Microbiology: Recent Results (from the past 720 hour(s))  Culture, blood (routine x 2)     Status: None (Preliminary result)   Collection Time: 10/13/14  2:36 AM  Result Value Ref Range Status   Specimen Description BLOOD LEFT ANTECUBITAL  Final   Special Requests BOTTLES DRAWN AEROBIC AND ANAEROBIC 10CC  Final   Culture NO GROWTH 1 DAY  Final   Report Status PENDING  Incomplete  Culture, blood (routine x 2)     Status: None (Preliminary result)   Collection Time: 10/13/14  2:46 AM  Result Value Ref Range Status   Specimen Description BLOOD RIGHT FOREARM  Final   Special Requests BOTTLES DRAWN AEROBIC AND ANAEROBIC 10CC  Final   Culture NO GROWTH 1 DAY  Final   Report Status PENDING  Incomplete    Anti-infectives    Start     Dose/Rate Route Frequency Ordered Stop   10/15/14 1200  cefTAZidime (FORTAZ) 2 g in dextrose 5 % 50 mL IVPB     2 g 100 mL/hr over 30 Minutes Intravenous 3 times per day  10/15/14 1040     10/14/14 1400  levofloxacin (LEVAQUIN) IVPB 750 mg  Status:  Discontinued     750 mg 100 mL/hr over 90 Minutes Intravenous Every 24 hours 10/14/14 1313 10/15/14 1031   10/14/14 1300  doxycycline (VIBRA-TABS) tablet 100 mg     100 mg Oral Every 12 hours 10/14/14 1251     10/13/14 0200  cefTRIAXone (ROCEPHIN) 2 g in dextrose 5 % 50 mL IVPB  Status:  Discontinued     2 g 100 mL/hr over 30 Minutes Intravenous Every 24 hours 10/13/14 0135 10/14/14 1243   10/13/14 0145  metroNIDAZOLE (FLAGYL) tablet 500 mg  Status:  Discontinued     500 mg Oral 3 times per day 10/13/14 0135 10/14/14 1243   10/12/14 2100  clindamycin (CLEOCIN) IVPB 600 mg     600 mg 100 mL/hr over 30 Minutes Intravenous  Once 10/12/14 2047 10/12/14 2322      Assessment: 64 YO female admitted 9/9 for possible sepsis 2/2 diabetic foot infection after stepping on a tack 6 days prior. MRI on 9/10 was negative for osteo or abscess. Now day #2 of abx for diabetic foot infection to switch from levofloxacin to ceftazidime. Also on doxycycline. Afebrile since 9/9 and WBC remains elevated at 13.3. Looking to transition to PO abx by 9/13 if clinical improvement  Goal of Therapy:  Resolution of infection  Plan:  Stop Levaquin  Start ceftazidime 1g IV Q8 Continue doxycycline 100mg  PO BID per MD Monitor renal function closely, clinical progression, cultures F/U transition to PO, LOT  Sylvanna Burggraf J 10/15/2014,10:40 AM

## 2014-10-15 NOTE — Telephone Encounter (Signed)
Dr. Dallas Schimke, the only way we would know is if pt has a copy of her vaccines and I looked in [facility] and she is not in the system. Aleathia Purdy, CMA.

## 2014-10-15 NOTE — Telephone Encounter (Signed)
-----   Message from Smiley Houseman, MD sent at 10/15/2014 12:22 PM EDT ----- Regarding: Tetanus vaccines in patient with puncture wound  Patient is admitted for a foot infection after a puncture wound. Per chart review, Tdap in 2014 but records do not go back far enough to make sure she has had at least 3 tetanus vaccines. I do not have access to [facility]. Could you please verify if patient has had at least three previous doses of tetanus? Thank you so much for your help!!

## 2014-10-15 NOTE — Progress Notes (Signed)
Family Medicine Teaching Service Daily Progress Note Intern Pager: [phone]  Patient name: Sara Davis Medical record number: [id number] Date of birth: November 18, 1950 Age: 64 y.o. Gender: female  Primary Care Provider: [name], MD Consultants: none Code Status: FULL  Pt Overview and Major Events to Date:  9/9>> Admitted for possible sepsis 2/2 diabetic foot infection; given Clindamycin x1; started Ceftriaxone and Flagyl 9/10>> MRI left foot neg for osteo/abscess 9/11>> changed antibiotic therapy due to no clinical improvement: switched to IV Levaquin and PO Doxycylcine  Assessment and Plan: Sara Davis is a 64 y.o. female presenting with a diabetic foot infection with surrounding cellulits after stepping on nail. PMH is significant for uncontrolled DM, HTN.  Diabetic Foot Infection: On admission, ESR  >140, CRP 12.3, lactic acid 1. Patient afebrile for 48 hours now. WBC count has decreased slightly from 13.6 to 13.3 9/12 MR left foot negative for osteo/abscess. Per chart, Tdap on 11/2012. Changed antibiotic therapy 9/11 due to no clinical improvement. Pain has improved overall. Patient noted of clinical improvement from yesterday.  - will discontinue IV Levaquin and switch to IV ceftazidime for better coverage - PO Doxycycline 100 mg BID- MRSA coverage - Continue Norco 5-325 mg 1-2 tablets Q4H PRN - f/u blood cultures: NG x 1 day - consider transitioning to PO antibiotic 9/13 if patient continues to show clinical improvement with total treatment duration likely 2-4 weeks.   Uncontrolled Type 2 DM: Last HbA1c 8.5 in March 2016. Patient takes Glipizide 10 mg daily, Metformin 1000 mg BID, and Lantus 30 units daily ar home. CBGs 85-109; 93 this AM.  - Hold oral meds - Continue Lantus 30 units daily - Continue moderate ISS - f/u HbA1c - CBGs 4 times daily  HTN: BP 131-152/58-74 over 24 hours (overall improved from yesterday; likely due to better pain control)  - home  Lisinopril-HCTZ 2--25 mg daily   Diet: Carb modified  VTE PPx: Lovenox SQ  Subjective:  Patient doing well this morning; states she would like to go home. Denies fevers but notes of some chills and sweats overnight (which have improved overall during her hospital stay). States her foot has improved after she was started on the new antibiotics. Notes that swelling has slightly improved in her left foot. States now she is able to walk on foot more although there is still some tenderness and soreness. Pain has improved. No BM overnight, does not feel constipated; has not had much of an appetite since getting sick.   Objective: Temp:  [98.6 F (37 C)-99.7 F (37.6 C)] 98.6 F (37 C) (09/12 0529) Pulse Rate:  [73-82] 73 (09/12 0529) Resp:  [17] 17 (09/12 0529) BP: (131-132)/(58-65) 132/65 mmHg (09/12 0529) SpO2:  [96 %-100 %] 96 % (09/12 0529) Physical Exam: General: NAD Cardiovascular: RRR, no murmur, rubs, gallops Respiratory: CTAB bilaterally  Abdomen: soft, NT, ND, +BS Extremities: Left foot: ~1cm ulceration with eschar on lateral plantar foot; erythema surrounding echar which extends to fifth digit and to the plantar aspect of mid foot. Erythema also noted on dorsum of left foot along with some swelling. There are few mild erythematous streaks extending up the left leg up to about mid shin (but has not passed the marked border on her left leg). Mild swelling noted on lower shin. Tenderness only around eschar. Mildly increased warmth of foot and distal leg up to mid shin. Good pulses distally; well perfused.  Laboratory:  Recent Labs Lab 10/13/14 0236 10/14/14 0540 10/15/14 0436  WBC  13.1* 13.6* 13.3*  HGB 8.4* 9.6* 9.8*  HCT 23.5* 28.4* 29.8*  PLT 277 314 357    Recent Labs Lab 10/12/14 2125 10/14/14 0540 10/15/14 0436  NA 135 139 137  K 3.6 3.1* 3.7  CL 101 107 103  CO2 $Re'25 24 25  'Vba$ BUN 10 5* 7  CREATININE 1.06* 0.87 1.06*  CALCIUM 8.9 8.6* 8.7*  GLUCOSE 204* 67 85    Imaging/Diagnostic Tests: X-ray Left Foot 9/9>> soft tissue ulceration lateral to 5th proximal phalanx without underlying bony destruction   MRI Left Foot 9/10>> no evidence of osteo/abscess. Necrotic tissue plantar to the fifth MTP joint with ulceration or laceration. Edema and enhancement plantar to the first MTP joint may represent a second site of trauma or an ulceration in this diabetic patient. Diffuse cellulitis of the forefoot.   Smiley Houseman, MD 10/15/2014, 6:53 AM PGY-1, [place] Intern pager: [phone], text pages welcome

## 2014-10-15 NOTE — Care Management Note (Signed)
Case Management Note  Patient Details  Name: Sara Davis MRN: [id number] Date of Birth: 20-Jul-1950  Subjective/Objective:    64 y.o. F admitted with L Diabetic foot Ulcer with surrounding cellulitis after stepping on nail. Treated with antibiotics. Anticipate discharge home with spouse. Transitioning to PO abx. Awaiting final Blood Cultures.                 Action/Plan:CM will continue to follow for final disposition and  Any discharge needs.    Expected Discharge Date:  10/15/14               Expected Discharge Plan:  [place]  In-House Referral:     Discharge planning Services  CM Consult  Post Acute Care Choice:    Choice offered to:     DME Arranged:    DME Agency:     HH Arranged:    HH Agency:     Status of Service:  In process, will continue to follow  Medicare Important Message Given:    Date Medicare IM Given:    Medicare IM give by:    Date Additional Medicare IM Given:    Additional Medicare Important Message give by:     If discussed at [place] of Stay Meetings, dates discussed:    Additional Comments:  Delrae Sawyers, RN 10/15/2014, 2:51 PM

## 2014-10-16 LAB — CBC WITH DIFFERENTIAL/PLATELET
BASOS ABS: 0 10*3/uL (ref 0.0–0.1)
BASOS PCT: 0 % (ref 0–1)
EOS ABS: 0.1 10*3/uL (ref 0.0–0.7)
EOS PCT: 1 % (ref 0–5)
HCT: 30.3 % — ABNORMAL LOW (ref 36.0–46.0)
Hemoglobin: 9.8 g/dL — ABNORMAL LOW (ref 12.0–15.0)
Lymphocytes Relative: 22 % (ref 12–46)
Lymphs Abs: 2.3 10*3/uL (ref 0.7–4.0)
MCH: 27.1 pg (ref 26.0–34.0)
MCHC: 32.3 g/dL (ref 30.0–36.0)
MCV: 83.7 fL (ref 78.0–100.0)
Monocytes Absolute: 0.8 10*3/uL (ref 0.1–1.0)
Monocytes Relative: 7 % (ref 3–12)
Neutro Abs: 7.3 10*3/uL (ref 1.7–7.7)
Neutrophils Relative %: 70 % (ref 43–77)
PLATELETS: 395 10*3/uL (ref 150–400)
RBC: 3.62 MIL/uL — AB (ref 3.87–5.11)
RDW: 12 % (ref 11.5–15.5)
WBC: 10.5 10*3/uL (ref 4.0–10.5)

## 2014-10-16 LAB — BASIC METABOLIC PANEL
ANION GAP: 7 (ref 5–15)
BUN: 12 mg/dL (ref 6–20)
CO2: 27 mmol/L (ref 22–32)
Calcium: 9 mg/dL (ref 8.9–10.3)
Chloride: 103 mmol/L (ref 101–111)
Creatinine, Ser: 1.08 mg/dL — ABNORMAL HIGH (ref 0.44–1.00)
GFR, EST NON AFRICAN AMERICAN: 54 mL/min — AB (ref >60)
Glucose, Bld: 179 mg/dL — ABNORMAL HIGH (ref 65–99)
POTASSIUM: 3.7 mmol/L (ref 3.5–5.1)
SODIUM: 137 mmol/L (ref 135–145)

## 2014-10-16 LAB — GLUCOSE, CAPILLARY
GLUCOSE-CAPILLARY: 174 mg/dL — AB (ref 65–99)
GLUCOSE-CAPILLARY: 183 mg/dL — AB (ref 65–99)
GLUCOSE-CAPILLARY: 255 mg/dL — AB (ref 65–99)
GLUCOSE-CAPILLARY: 280 mg/dL — AB (ref 65–99)

## 2014-10-16 MED ORDER — CIPROFLOXACIN HCL 500 MG PO TABS
500.0000 mg | ORAL_TABLET | Freq: Two times a day (BID) | ORAL | Status: DC
  Administered 2014-10-16 – 2014-10-17 (×3): 500 mg via ORAL
  Filled 2014-10-16 (×3): qty 1

## 2014-10-16 NOTE — Progress Notes (Signed)
Family Medicine Teaching Service Daily Progress Note Intern Pager: [phone]  Patient name: Sara Davis Medical record number: [id number] Date of birth: [birth year] Age: 64 y.o. Gender: female  Primary Care Provider: [name], MD Consultants: none Code Status: FULL  Pt Overview and Major Events to Date:  9/9>> Admitted for possible sepsis 2/2 diabetic foot infection; given Clindamycin x1; started Ceftriaxone and Flagyl 9/10>> MRI left foot neg for osteo/abscess 9/11>> changed antibiotic therapy due to no clinical improvement: switched to IV Levaquin and PO Doxycylcine  Assessment and Plan: TESA MAGISTRO is a 64 y.o. female presenting with a diabetic foot infection with surrounding cellulits after stepping on nail. PMH is significant for uncontrolled DM, HTN.  Diabetic Foot Infection: Improved cellulitis and WBC 10.5<<13<<13.6 - S/p D1 IV Levaquin, D2 IV ceftazidime - D3 PO Doxycycline, transition to oral Cipro today - Continue Norco 5-325 mg 1-2 tablets Q4H PRN - f/u blood cultures: NG x 2 day - consider transitioning to PO antibiotic 9/13 if patient continues to show clinical improvement with total treatment duration likely 2-4 weeks.   Uncontrolled Type 2 DM: Last HbA1c 8.5 in March 2016. Patient takes Glipizide 10 mg daily, Metformin 1000 mg BID, and Lantus 30 units daily ar home. CBGs 85-109; 93 this AM.  - Hold oral meds - Continue Lantus 30 units daily - Continue moderate ISS - f/u HbA1c - CBGs 4 times daily  HTN: BP 131-152/58-74 over 24 hours (overall improved from yesterday; likely due to better pain control)  - home Lisinopril-HCTZ 2--25 mg daily   Diet: Carb modified  VTE PPx: Lovenox SQ  Subjective:  Reports her foot is improved with some soreness over left inner ankle but overall better. Denies chest pain, SOB  Objective: Temp:  [98.7 F (37.1 C)-99.6 F (37.6 C)] 99.6 F (37.6 C) (09/13 0538) Pulse Rate:  [69-78] 73 (09/13 0538) Resp:  [16-18] 18  (09/13 0538) BP: (129-143)/(53-66) 143/53 mmHg (09/13 0538) SpO2:  [93 %-99 %] 99 % (09/13 0538) Physical Exam: General: NAD Cardiovascular: RRR, no murmur, rubs, gallops Respiratory: CTAB Abdomen: soft, NT, ND, +BS Extremities: Left foot: ~1cm ulceration with eschar on lateral plantar foot; improved erythema surrounding eschar. Improved erythema and swelling on dorsum of left foot along. No streaking noted today, mild tenderness noted over left medial maleolus   Laboratory:  Recent Labs Lab 10/14/14 0540 10/15/14 0436 10/16/14 0553  WBC 13.6* 13.3* 10.5  HGB 9.6* 9.8* 9.8*  HCT 28.4* 29.8* 30.3*  PLT 314 357 395    Recent Labs Lab 10/14/14 0540 10/15/14 0436 10/16/14 0553  NA 139 137 137  K 3.1* 3.7 3.7  CL 107 103 103  CO2 24 25 27   BUN 5* 7 12  CREATININE 0.87 1.06* 1.08*  CALCIUM 8.6* 8.7* 9.0  GLUCOSE 67 85 179*   Imaging/Diagnostic Tests: X-ray Left Foot 9/9>> soft tissue ulceration lateral to 5th proximal phalanx without underlying bony destruction   MRI Left Foot 9/10>> no evidence of osteo/abscess. Necrotic tissue plantar to the fifth MTP joint with ulceration or laceration. Edema and enhancement plantar to the first MTP joint may represent a second site of trauma or an ulceration in this diabetic patient. Diffuse cellulitis of the forefoot.   Veatrice Bourbon, MD 10/16/2014, 12:02 PM PGY-2, [place] Intern pager: [phone], text pages welcome

## 2014-10-16 NOTE — Discharge Summary (Signed)
[facility] Discharge Summary  Patient name: Sara Davis Medical record number: [id number] Date of birth: December 02, 1950 Age: 64 y.o. Gender: female Date of Admission: 10/12/2014  Date of Discharge: 10/17/14 Admitting Physician: Sara Resides, MD  Primary Care Provider: [name], MD Consultants: none  Indication for Hospitalization: Possible sepsis due to diabetic foot infection with cellulitis  Discharge Diagnoses/Problem List:  Diabetic Foot Infection  Disposition: home  Discharge Condition: improved  Discharge Exam:  GEN: NAD HEENT: Atraumatic, normocephalic, neck supple, EOMI, sclera clear  CV: RRR, no murmurs, rubs, or gallops PULM: CTAB, normal effort ABD: Soft, nontender, nondistended, NABS, no organomegaly SKIN: No rash or cyanosis; warm and well-perfused EXTR: No lower extremity edema or calf tenderness; Left foot: ~1cm ulceration with eschar on lateral plantar foot; improved erythema surrounding eschar; improved erythema of plantar foot although this is still present. Improved erythema and swelling on dorsum of left foot. No streaking noted today on lower extremity, mild tenderness noted over left medial maleolus. DP pulses palpated bilaterally. Sensation intact to light touch bilaterally in lowe extremities.  PSYCH: Mood and affect euthymic, normal rate and volume of speech NEURO: Awake, alert, no focal deficits grossly, normal speech  Brief Hospital Course:  Sara Davis is a 64 yo female with a past medical history of diabetes presented with a left diabetic foot infection. Approximately 6 days prior to admission, patient stepped on a tack while wearing flip flops. She did not seek immediate care as it was not bothersome until a day prior to admission when her left foot became painful, swollen, and red. She reported of subjective fevers and chills at home.   Diabetic Foot Infection:  Patient initially met sepsis criteria with leukocytosis  (14.2), tachycardia of 100, and mild fever of 100.83F but overall appeared stable. On exam, her left foot was noted to have black eschar over the 5th metatarsophalangeal joint of the plantar aspect of her left foot; the entire foot was erythematous, edematous, warm and tender which also extended lightly throughout the left lower leg. Peripheral pulses were intact bilaterally. She received one dose of IV Clindamycin in ED and 1L of IVF. X-ray of foot showed no evidence of osteomyelitis. Lactic Acid was normal. HIV antibody was non-reactive. However, since ESR (>140) and CRP (12.3) were elevated, MRI of foot was obtained to rule out osteomyelitis and showed no osteomyelitis or abcess. She was started on IV Ceftriaxone and PO Flagyl (for 1 day). However antibiotics were switched to IV Levaquin (received for 1 day) and PO Doxycycline due to no clinical improvement of her infection. IV Levaquin was switched to IV Ceftazidime (received for 2 days) for better pseudomonal coverage; patient was continued on doxycycline. Due to clinical improvement, patient was transitioned to PO Ciprofloxacin and continued on PO Doxycycline. Patient tolerated PO antibiotics and was discharged with PO Ciprofloxacin and PO Doxycycline for a total course of 2 weeks (end dates through 9/24 for Doxycycline and through 9/26 for Ciprofloxacin.  Patient's leukocytosis and fever resolved during admission. Pain was managed with Norco as needed. Blood cultures were followed and showed no growth for 3 days. Per chart review, patient had Tdap on 10/201; however, pt's previous tetanus vaccination records were not able to be seen on [facility] and patient was unaware of previous immunizations. Therefore, patient was given Tetanus immune globulin.    DM2: Patient's A1c was noted to be 10.1 during this admission (significantly elevated from 8.5 on 04/2014). Patient was on home Lantus 30 units  with moderate ISS while admitted. Patient was restarted on her home  medications including Lantus 30, Metformin, and Glipizide.   HTN:  On admission, patient stated she had not been taking her blood pressure medication as she ran out of medication. Blood pressures were initially elevated with systolics in 314H-702O. Patient's home Lisinopril-HCTZ was restarted. Her blood pressures improved with pain management and after restarting home medication.   Issues for Follow Up:  - clinical improvement of cellulitis/diabetic foot infection - elevated A1c of 10.1 - consider repeat BMP to follow Creatinine  Significant Procedures: none  Significant Labs and Imaging:   Recent Labs Lab 10/15/14 0436 10/16/14 0553 10/17/14 0429  WBC 13.3* 10.5 11.2*  HGB 9.8* 9.8* 9.9*  HCT 29.8* 30.3* 31.0*  PLT 357 395 424*    Recent Labs Lab 10/12/14 2125 10/14/14 0540 10/15/14 0436 10/16/14 0553 10/17/14 0429  NA 135 139 137 137 139  K 3.6 3.1* 3.7 3.7 3.9  CL 101 107 103 103 102  CO2 _0 GLUCOSE 204* 67 85 179* 165*  BUN 10 5* _1 CREATININE 1.06* 0.87 1.06* 1.08* 1.11*  CALCIUM 8.9 8.6* 8.7* 9.0 9.4  X-ray Foot:  IMPRESSION: No fracture or dislocation is noted. Soft tissue ulceration is seen lateral to fifth proximal phalanx without underlying bony destruction  MRI Foot 10/13/14: IMPRESSION: 1. Negative for osteomyelitis or abscess. 2. Necrotic tissue plantar to the fifth MTP joint with ulceration or laceration. Although the location of the wound is not given, this is the most likely site for penetrating trauma. 3. Edema and enhancement plantar to the first MTP joint may represent a second site of trauma or un ulceration in this diabetic patient. 4. Diffuse cellulitis of the forefoot.  Results/Tests Pending at Time of Discharge: none  Discharge Medications:    Medication List    STOP taking these medications        clindamycin 150 MG capsule  Commonly known as:  CLEOCIN     nicotine 21 mg/24hr patch  Commonly known as:   NICODERM CQ - dosed in mg/24 hours      TAKE these medications        ciprofloxacin 500 MG tablet  Commonly known as:  CIPRO  Take 1 tablet (500 mg total) by mouth 2 (two) times daily. First home dose of this medication will be at 10pm today, 9/14     CONTOUR BLOOD GLUCOSE SYSTEM Devi  Use to test blood sugars once in the morning before food and once in the evening.DX: 250.02     doxycycline 100 MG tablet  Commonly known as:  VIBRA-TABS  Take 1 tablet (100 mg total) by mouth every 12 (twelve) hours. First home dose of this medication is at 10pm today, 9/14     glipiZIDE 10 MG tablet  Commonly known as:  GLUCOTROL  Take 1 tablet (10 mg total) by mouth daily with breakfast.     glucose blood test strip  Commonly known as:  ONE TOUCH TEST STRIPS  Please check blood sugar once daily in AM before eating and once daily in PM     insulin glargine 100 UNIT/ML injection  Commonly known as:  LANTUS  Inject 0.3 mLs (30 Units total) into the skin daily.     Insulin Pen Needle 31G X 8 MM Misc  Commonly known as:  B-D ULTRAFINE III SHORT PEN  CHECK BLOOD SUGARS ONCE IN MORNING BEFORE FOOD AND ONCE IN THE EVENING.  lisinopril-hydrochlorothiazide 20-25 MG per tablet  Commonly known as:  PRINZIDE,ZESTORETIC  Take 1 tablet by mouth daily.     metFORMIN 1000 MG tablet  Commonly known as:  GLUCOPHAGE  Take 1 tablet (1,000 mg total) by mouth 2 (two) times daily with a meal.     onetouch ultrasoft lancets  Please check blood sugar once daily in AM before eating and once daily in PM        Discharge Instructions: Please refer to Patient Instructions section of EMR for full details.  Patient was counseled important signs and symptoms that should prompt return to medical care, changes in medications, dietary instructions, activity restrictions, and follow up appointments.   Follow-Up Appointments: Follow-up Information    Follow up with Andrena Mews, MD On 10/26/2014.   Specialty:   Family Medicine   Why:  at 10:45 am    Contact information:   [place] [id number] [phone]       Follow up with wound clinic.   Why:  We made a referral for you with outpatient wound clinic for your diabetic foot wound. They will call you to set up an appointment       Smiley Houseman, MD 10/17/2014, 12:01 PM PGY-1, [place]

## 2014-10-16 NOTE — Progress Notes (Addendum)
FMTS Attending Daily Note:  S:  Patient doing well today. No concerns.  Some tenderness when walking.  Eating and drinking well.  O: Gen:  Awake and alert. Ext:  Right leg WNL. Leg leg with markings in place. Erythema has retreated from these.  Edema is improving. Erythema still present plantar aspect of foot. Minimal tenderness.    Imp/Plan: 1.   Diabetic foot infection: - much improved now that we are covering for Pseudomonas.  Switched to oral abx today to ensure she can tolerate these. - can continue doxy  2.  Dispo: - likely able to DC home tomorrow if does well on orals.    Will sign resident note    Alveda Reasons, MD 10/16/2014 10:00 AM

## 2014-10-17 DIAGNOSIS — I10 Essential (primary) hypertension: Secondary | ICD-10-CM | POA: Insufficient documentation

## 2014-10-17 LAB — BASIC METABOLIC PANEL
Anion gap: 7 (ref 5–15)
BUN: 11 mg/dL (ref 6–20)
CHLORIDE: 102 mmol/L (ref 101–111)
CO2: 30 mmol/L (ref 22–32)
CREATININE: 1.11 mg/dL — AB (ref 0.44–1.00)
Calcium: 9.4 mg/dL (ref 8.9–10.3)
GFR calc Af Amer: 60 mL/min — ABNORMAL LOW (ref >60)
GFR calc non Af Amer: 52 mL/min — ABNORMAL LOW (ref >60)
Glucose, Bld: 165 mg/dL — ABNORMAL HIGH (ref 65–99)
Potassium: 3.9 mmol/L (ref 3.5–5.1)
SODIUM: 139 mmol/L (ref 135–145)

## 2014-10-17 LAB — CBC
HCT: 31 % — ABNORMAL LOW (ref 36.0–46.0)
HEMOGLOBIN: 9.9 g/dL — AB (ref 12.0–15.0)
MCH: 26.8 pg (ref 26.0–34.0)
MCHC: 31.9 g/dL (ref 30.0–36.0)
MCV: 84 fL (ref 78.0–100.0)
Platelets: 424 10*3/uL — ABNORMAL HIGH (ref 150–400)
RBC: 3.69 MIL/uL — ABNORMAL LOW (ref 3.87–5.11)
RDW: 12 % (ref 11.5–15.5)
WBC: 11.2 10*3/uL — ABNORMAL HIGH (ref 4.0–10.5)

## 2014-10-17 LAB — GLUCOSE, CAPILLARY
Glucose-Capillary: 176 mg/dL — ABNORMAL HIGH (ref 65–99)
Glucose-Capillary: 274 mg/dL — ABNORMAL HIGH (ref 65–99)

## 2014-10-17 MED ORDER — INSULIN PEN NEEDLE 31G X 8 MM MISC: Status: DC

## 2014-10-17 MED ORDER — DOXYCYCLINE HYCLATE 100 MG PO TABS: 100.0000 mg | ORAL_TABLET | Freq: Two times a day (BID) | ORAL | Status: DC

## 2014-10-17 MED ORDER — GLUCOSE BLOOD VI STRP: ORAL_STRIP | Status: DC

## 2014-10-17 MED ORDER — LISINOPRIL-HYDROCHLOROTHIAZIDE 20-25 MG PO TABS: 1.0000 | ORAL_TABLET | Freq: Every day | ORAL | Status: DC

## 2014-10-17 MED ORDER — BAYER CONTOUR MONITOR DEVI: Status: DC

## 2014-10-17 MED ORDER — GLIPIZIDE 10 MG PO TABS: 10.0000 mg | ORAL_TABLET | Freq: Every day | ORAL | Status: DC

## 2014-10-17 MED ORDER — CIPROFLOXACIN HCL 500 MG PO TABS: 500.0000 mg | ORAL_TABLET | Freq: Two times a day (BID) | ORAL | Status: DC

## 2014-10-17 MED ORDER — METFORMIN HCL 1000 MG PO TABS: 1000.0000 mg | ORAL_TABLET | Freq: Two times a day (BID) | ORAL | Status: DC

## 2014-10-17 NOTE — Discharge Instructions (Signed)
Ms. Sara Davis was admitted due to a left foot infection (cellulitis) that extended to your left lower leg after stepping on a nail. She was initially started on IV antibiotics then transitioned to oral antibiotics. Your swelling, redness, and pain improved with antibiotics. Please take the two antibiotics prescribed to you: Ciprofloxacin 500mg  1 tablet twice a day and Doxycycline 100mg  1 tablet twice a day until you run out of the medication; this will finish a total course of 28 days of antibiotics.   You have an appointment at the [facility] on 9/23 at 10:45 AM with Dr. Gwendlyn Deutscher; this is a hospital follow up appointment. We also made a referral to outpatient wound clinic for your diabetic foot wound;they will call you to make an appointment.    Below is some information about cellulitis (skin infection). Please pay attention to the sections titled "Please seek immediate care if" and "seek medical care if"  Cellulitis Cellulitis is an infection of the skin and the tissue beneath it. The infected area is usually red and tender. Cellulitis occurs most often in the arms and lower legs.  CAUSES  Cellulitis is caused by bacteria that enter the skin through cracks or cuts in the skin. The most common types of bacteria that cause cellulitis are staphylococci and streptococci. SIGNS AND SYMPTOMS   Redness and warmth.  Swelling.  Tenderness or pain.  Fever.  TREATMENT  Treatment usually involves taking an antibiotic medicine. HOME CARE INSTRUCTIONS   Take your antibiotic medicine as directed by your health care provider. Finish the antibiotic even if you start to feel better.  Keep the infected arm or leg elevated to reduce swelling.  Take medicines only as directed by your health care provider.  Keep all follow-up visits as directed by your health care provider. SEEK MEDICAL CARE IF:   You notice red streaks coming from the infected area.  Your red area gets larger or turns dark in  color.  Your bone or joint underneath the infected area becomes painful after the skin has healed.  Your infection returns in the same area or another area.  You notice a swollen bump in the infected area.  You develop new symptoms.  You have a fever. SEEK IMMEDIATE MEDICAL CARE IF:   You feel very sleepy.  You develop vomiting or diarrhea.  You have a general ill feeling (malaise) with muscle aches and pains. MAKE SURE YOU:   Understand these instructions.  Will watch your condition.  Will get help right away if you are not doing well or get worse. Document Released: 10/29/2004 Document Revised: 06/05/2013 Document Reviewed: 04/06/2011 [facility] Patient Information 2015 [place], [place]. This information is not intended to replace advice given to you by your health care provider. Make sure you discuss any questions you have with your health care provider.

## 2014-10-17 NOTE — Progress Notes (Signed)
Family Medicine Teaching Service Daily Progress Note Intern Pager: [phone]  Patient name: Sara Davis Medical record number: [id number] Date of birth: 1950/09/01 Age: 64 y.o. Gender: female  Primary Care Provider: [name], MD Consultants: none Code Status: FULL  Pt Overview and Major Events to Date:  9/9>> Admitted for possible sepsis 2/2 diabetic foot infection; given Clindamycin x1; started Ceftriaxone and Flagyl 9/10>> MRI left foot neg for osteo/abscess 9/11>> changed antibiotic therapy due to no clinical improvement: switched to IV Levaquin and PO Doxycylcine 9/12>> changed antibiotic to IV ceftazidime and PO Doxycycline 9/13>> transitioned to PO antibiotics: Ciprofloxacin and Doxycycline   Assessment and Plan: Sara Davis is a 64 y.o. female presenting with a diabetic foot infection with surrounding cellulits after stepping on nail. PMH is significant for uncontrolled DM, HTN.  Diabetic Foot Infection: Last tetanus noted to be in 2014. Unable to obtain previous immunization records. Therefore given Tetanus immune globulin 9/12. Improved cellulitis and WBC 11.2 (9/14)<<10.5<<13<<13.6. Afebrile overnight. - S/p D1 IV Levaquin, D2 IV ceftazidime - D4 PO Doxycycline, D2 PO Cipro - Continue Norco 5-325 mg 1-2 tablets Q4H PRN - f/u blood cultures: NG x 3 day - consider transitioning to PO antibiotic 9/13 if patient continues to show clinical improvement with total treatment duration likely 2 weeks.   Uncontrolled Type 2 DM: Last HbA1c 8.5 in March 2016. Patient takes Glipizide 10 mg daily, Metformin 1000 mg BID, and Lantus 30 units daily ar home. CBGs 165-280; 176 this AM.  - Hold oral meds - Continue Lantus 30 units daily - Continue moderate ISS - f/u HbA1c - CBGs 4 times daily  HTN: BP 129-144/53-74 over 24 hours - home Lisinopril-HCTZ 2--25 mg daily   Diet: Carb modified  VTE PPx: Lovenox SQ  Disposition: home today  Subjective:  No acute events overnight.  No PRN pain meds required over the past 24 hours. Feels that her foot has improved. Denies fevers or chills.   Objective: Temp:  [98.4 F (36.9 C)-99.1 F (37.3 C)] 98.4 F (36.9 C) (09/14 0655) Pulse Rate:  [75-88] 75 (09/14 0655) Resp:  [18] 18 (09/14 0655) BP: (128-144)/(67-74) 139/74 mmHg (09/14 0655) SpO2:  [98 %] 98 % (09/14 0655) Physical Exam: General: NAD Cardiovascular: RRR, no murmur, rubs, gallops Respiratory: CTAB Abdomen: soft, NT, ND, +BS Extremities: Left foot: ~1cm ulceration with eschar on lateral plantar foot; improved erythema surrounding eschar; improved erythema of plantar foot although this is still present. Improved erythema and swelling on dorsum of left foot along. No streaking noted today, mild tenderness noted over left medial maleolus   Laboratory:  Recent Labs Lab 10/15/14 0436 10/16/14 0553 10/17/14 0429  WBC 13.3* 10.5 11.2*  HGB 9.8* 9.8* 9.9*  HCT 29.8* 30.3* 31.0*  PLT 357 395 424*    Recent Labs Lab 10/15/14 0436 10/16/14 0553 10/17/14 0429  NA 137 137 139  K 3.7 3.7 3.9  CL 103 103 102  CO2 25 27 30   BUN 7 12 11   CREATININE 1.06* 1.08* 1.11*  CALCIUM 8.7* 9.0 9.4  GLUCOSE 85 179* 165*    Imaging/Diagnostic Tests: X-ray Left Foot 9/9>> soft tissue ulceration lateral to 5th proximal phalanx without underlying bony destruction   MRI Left Foot 9/10>> no evidence of osteo/abscess. Necrotic tissue plantar to the fifth MTP joint with ulceration or laceration. Edema and enhancement plantar to the first MTP joint may represent a second site of trauma or an ulceration in this diabetic patient. Diffuse cellulitis of the forefoot.  Smiley Houseman, MD 10/17/2014, 7:26 AM PGY-1, [place] Intern pager: [phone], text pages welcome

## 2014-10-17 NOTE — Progress Notes (Signed)
Inpatient Diabetes Program Recommendations  AACE/ADA: New Consensus Statement on Inpatient Glycemic Control (2015)  Target Ranges:  Prepandial:   less than 140 mg/dL      Peak postprandial:   less than 180 mg/dL (1-2 hours)      Critically ill patients:  140 - 180 mg/dL   Results for Sara Davis, Sara Davis (MRN [id number]) as of 10/17/2014 10:09  Ref. Range 10/16/2014 06:24 10/16/2014 11:19 10/16/2014 15:59 10/16/2014 21:50 10/17/2014 06:55  Glucose-Capillary Latest Ref Range: 65-99 mg/dL 174 (H) 183 (H) 255 (H) 280 (H) 176 (H)   Review of Glycemic Control: Hyperglycemia with meal times  Diabetes history: DM 2 Outpatient Diabetes medications: Glipizide 10 mg Daily, Lantus 30 units Daily, Metformin 1,000 mg BID Current orders for Inpatient glycemic control: Novolog Moderate + HS scale + Lantus 30 QHS  Inpatient Diabetes Program Recommendations:  Correction (SSI): Glucose trending upward with meals as the day progresses. Please consider increasing correction to Novolog Resistant scale.  Thanks,  Tama Headings RN, MSN, [facility] Inpatient Diabetes Coordinator Team Pager [phone]

## 2014-10-17 NOTE — Progress Notes (Signed)
Discharged to home accompanied by spouse. Discharge instructions reviewed and copy given to patient, No questions noted. Taken down by to car in wheelchair by volunteer with spouse taking all belongings.Margarito Liner

## 2014-10-18 LAB — CULTURE, BLOOD (ROUTINE X 2)
CULTURE: NO GROWTH
Culture: NO GROWTH

## 2014-10-26 ENCOUNTER — Encounter: Payer: Self-pay | Admitting: Family Medicine

## 2014-10-26 ENCOUNTER — Ambulatory Visit (INDEPENDENT_AMBULATORY_CARE_PROVIDER_SITE_OTHER): Payer: 59 | Admitting: Family Medicine

## 2014-10-26 VITALS — BP 120/75 | HR 97 | Temp 98.1°F | Ht 66.0 in | Wt 152.0 lb

## 2014-10-26 DIAGNOSIS — E1165 Type 2 diabetes mellitus with hyperglycemia: Secondary | ICD-10-CM

## 2014-10-26 DIAGNOSIS — E1169 Type 2 diabetes mellitus with other specified complication: Secondary | ICD-10-CM | POA: Diagnosis not present

## 2014-10-26 DIAGNOSIS — L089 Local infection of the skin and subcutaneous tissue, unspecified: Secondary | ICD-10-CM | POA: Diagnosis not present

## 2014-10-26 DIAGNOSIS — IMO0002 Reserved for concepts with insufficient information to code with codable children: Secondary | ICD-10-CM

## 2014-10-26 DIAGNOSIS — E11628 Type 2 diabetes mellitus with other skin complications: Secondary | ICD-10-CM

## 2014-10-26 NOTE — Assessment & Plan Note (Signed)
A1C elevated during hospital admission at 10. This was in the setting of wound infection. Patient stated her CGB has improved since she got back home. Continue current DM regimen. Return to PCP in 1 wk with CBG log. Might consider medication adjustment if CBG runs in the 200s. Return precaution discussed.

## 2014-10-26 NOTE — Progress Notes (Signed)
Subjective:     Patient ID: Sara Davis, female   DOB: Jun 04, 1950, 64 y.o.   MRN: [id number]  HPI DM foot: Here for follow up after hospitalization. She was d/c home on Doxy and clindamycin. She had GI irritation with diarrhea since she left hospital but this is now getting better. She has few more days of her A/B. She denies fever, her foot pain and swelling has improved a lot. DM2: Patient is compliant with her Glipizide 10 mg qd, Metformin 1000 mg BID and Lantus 30 unit qd. Her home CBG ranges from 79 to 190, she denies recent hypoglycemic episode.  Current Outpatient Prescriptions on File Prior to Visit  Medication Sig Dispense Refill  . ciprofloxacin (CIPRO) 500 MG tablet Take 1 tablet (500 mg total) by mouth 2 (two) times daily. First home dose of this medication will be at 10pm today, 9/14 25 tablet 0  . doxycycline (VIBRA-TABS) 100 MG tablet Take 1 tablet (100 mg total) by mouth every 12 (twelve) hours. First home dose of this medication is at 10pm today, 9/14 21 tablet 0  . glipiZIDE (GLUCOTROL) 10 MG tablet Take 1 tablet (10 mg total) by mouth daily with breakfast. 30 tablet 0  . insulin glargine (LANTUS) 100 UNIT/ML injection Inject 0.3 mLs (30 Units total) into the skin daily. 10 mL 12  . lisinopril-hydrochlorothiazide (PRINZIDE,ZESTORETIC) 20-25 MG per tablet Take 1 tablet by mouth daily. 30 tablet 0  . metFORMIN (GLUCOPHAGE) 1000 MG tablet Take 1 tablet (1,000 mg total) by mouth 2 (two) times daily with a meal. 60 tablet 0  . Blood Glucose Monitoring Suppl (CONTOUR BLOOD GLUCOSE SYSTEM) DEVI Use to test blood sugars once in the morning before food and once in the evening.DX: 250.02 1 Device 0  . glucose blood (ONE TOUCH TEST STRIPS) test strip Please check blood sugar once daily in AM before eating and once daily in PM 100 each 12  . Insulin Pen Needle (B-D ULTRAFINE III SHORT PEN) 31G X 8 MM MISC CHECK BLOOD SUGARS ONCE IN MORNING BEFORE FOOD AND ONCE IN THE EVENING. 100 each 0  .  Lancets (ONETOUCH ULTRASOFT) lancets Please check blood sugar once daily in AM before eating and once daily in PM 100 each 12   No current facility-administered medications on file prior to visit.   Past Medical History  Diagnosis Date  . Hypertension   . Diabetes mellitus without complication   . Depression     Review of Systems  Respiratory: Negative.   Cardiovascular: Negative.   Gastrointestinal: Positive for diarrhea.  Skin: Positive for wound.  All other systems reviewed and are negative.   Filed Vitals:   10/26/14 1119  BP: 120/75  Pulse: 97  Temp: 98.1 F (36.7 C)  TempSrc: Oral  Height: 5\' 6"  (1.676 m)  Weight: 152 lb (68.947 kg)       Objective:   Physical Exam  Constitutional: She appears well-developed. No distress.  Cardiovascular: Normal rate and normal heart sounds.   No murmur heard. Pulmonary/Chest: Effort normal and breath sounds normal. No respiratory distress. She has no wheezes.  Abdominal: Soft. Bowel sounds are normal. She exhibits no distension and no mass. There is no tenderness.  Musculoskeletal: She exhibits no edema or tenderness.       Left foot: There is no tenderness and no swelling.       Feet:  Nursing note and vitals reviewed.        Assessment:     DM  foot ulcer. DM2     Plan:     Check problem list.

## 2014-10-26 NOTE — Patient Instructions (Signed)
Dressing Change °A dressing is a material placed over wounds. It keeps the wound clean, dry, and protected from further injury.  °BEFORE YOU BEGIN °· Get your supplies together. Things you may need include: °¨ Salt solution (saline). °¨ Flexible gauze bandage. °¨ Medicated cream. °¨ Tape. °¨ Gloves. °¨ Belly (abdominal) pads. °¨ Gauze squares. °¨ Plastic bags. °· Take pain medicine 30 minutes before the bandage change if you need it. °· Take a shower before you do the first bandage change of the day. Put plastic wrap or a bag over the dressing. °REMOVING YOUR OLD BANDAGE °· Wash your hands with soap and water. Dry your hands with a clean towel. °· Put on your gloves. °· Remove any tape. °· Remove the old bandage as told. If it sticks, put a small amount of warm water on it to loosen the bandage. °· Remove any gauze or packing tape in your wound. °· Take off your gloves. °· Put the gloves, tape, gauze, or any packing tape in a plastic bag. °CHANGING YOUR BANDAGE °· Open the supplies. °· Take the cap off the salt solution. °· Open the gauze. Leave the gauze on the inside of the package. °· Put on your gloves. °· Clean your wound as told by your doctor. °· Keep your wound dry if your doctor told you to do so. °· Your doctor may tell you to do one or more of the following: °¨ Pick up the gauze. Pour the salt solution over the gauze. Squeeze out the extra salt solution. °¨ Put medicated cream or other medicine on your wound. °¨ Put solution soaked gauze only in your wound, not on the skin around it. °¨ Pack your wound loosely. °¨ Put dry gauze on your wound. °¨ Put belly pads over the dry gauze if your bandages soak through. °· Tape the bandage in place so it will not fall off. Do not wrap the tape all the way around your arm or leg. °· Wrap the bandage with the flexible gauze bandage as told by your doctor. °· Take off your gloves. Put them in the plastic bag with the old bandage. Tie the bag shut and throw it  away. °· Keep the bandage clean and dry. °· Wash your hands. °GET HELP RIGHT AWAY IF:  °· Your skin around the wound looks red. °· Your wound feels more tender or sore. °· You see yellowish-white fluid (pus) in the wound. °· Your wound smells bad. °· You have a fever. °· Your skin around the wound has a red rash that itches and burns. °· You see black or yellow skin in your wound that was not there before. °· You feel sick to your stomach (nauseous), throw up (vomit), and feel very tired. °Document Released: 04/17/2008 Document Revised: 06/05/2013 Document Reviewed: 11/30/2010 °[employer]® Patient Information ©2015 [employer]. This information is not intended to replace advice given to you by your health care provider. Make sure you discuss any questions you have with your health care provider. ° °

## 2014-10-26 NOTE — Assessment & Plan Note (Addendum)
I reviewed her hospital note as well as picture of her foot. There has been some improvement today. ( Picture taken today after obtaining verbal consent). Patient is now tolerating her A/B well with less GI symptoms. She is advised to complete the course of treatment as recommended. Her tetanus vaccination was updated during hospital admission. Wound dressing done today. F/U in 1 wk for wound check.

## 2014-11-02 ENCOUNTER — Ambulatory Visit: Payer: 59 | Admitting: Family Medicine

## 2014-11-12 ENCOUNTER — Encounter: Payer: Self-pay | Admitting: Family Medicine

## 2014-11-12 ENCOUNTER — Ambulatory Visit (INDEPENDENT_AMBULATORY_CARE_PROVIDER_SITE_OTHER): Payer: 59 | Admitting: Family Medicine

## 2014-11-12 VITALS — BP 128/67 | HR 96 | Temp 98.3°F | Ht 66.0 in | Wt 153.4 lb

## 2014-11-12 DIAGNOSIS — IMO0002 Reserved for concepts with insufficient information to code with codable children: Secondary | ICD-10-CM

## 2014-11-12 DIAGNOSIS — Z794 Long term (current) use of insulin: Secondary | ICD-10-CM

## 2014-11-12 DIAGNOSIS — E11628 Type 2 diabetes mellitus with other skin complications: Secondary | ICD-10-CM

## 2014-11-12 DIAGNOSIS — E11621 Type 2 diabetes mellitus with foot ulcer: Secondary | ICD-10-CM | POA: Diagnosis not present

## 2014-11-12 DIAGNOSIS — E1165 Type 2 diabetes mellitus with hyperglycemia: Secondary | ICD-10-CM

## 2014-11-12 DIAGNOSIS — E1169 Type 2 diabetes mellitus with other specified complication: Secondary | ICD-10-CM | POA: Diagnosis not present

## 2014-11-12 DIAGNOSIS — L089 Local infection of the skin and subcutaneous tissue, unspecified: Secondary | ICD-10-CM

## 2014-11-12 DIAGNOSIS — I10 Essential (primary) hypertension: Secondary | ICD-10-CM | POA: Diagnosis not present

## 2014-11-12 DIAGNOSIS — L97509 Non-pressure chronic ulcer of other part of unspecified foot with unspecified severity: Secondary | ICD-10-CM

## 2014-11-12 MED ORDER — LISINOPRIL-HYDROCHLOROTHIAZIDE 20-25 MG PO TABS: 1.0000 | ORAL_TABLET | Freq: Every day | ORAL | Status: DC

## 2014-11-13 MED ORDER — INSULIN GLARGINE 100 UNIT/ML ~~LOC~~ SOLN: 30.0000 [IU] | Freq: Every day | SUBCUTANEOUS | Status: DC

## 2014-11-13 NOTE — Assessment & Plan Note (Signed)
Reports improvement in CBG but does not bring in meter or log to verify. Will continue current regimen, refills sent for lantus. Follow up 2-3 months.

## 2014-11-13 NOTE — Assessment & Plan Note (Signed)
Does not appear to be infected at this time, no indications for continuing antibiotics. However, it does not appear to be healing well with current application of just regular gauze and feel this wound needs further level of care with debridement, possible use of enzymatic bandages. Will refer to podiatry for more quick referral instead of wound care clinic. Instructed patient to call our clinic if she does not get a call from podiatry over next few days. May benefit from diabetic shoes in the future. Follow up after podiatry.

## 2014-11-13 NOTE — Assessment & Plan Note (Signed)
At goal. Refill sent for lisinopril-hctz. Follow up 2-3 months with DM follow up.

## 2014-11-13 NOTE — Progress Notes (Signed)
   Subjective:    Patient ID: Sara Davis, female    DOB: 16-Nov-1950, 64 y.o.   MRN: [id number]  HPI  CC: follow up   # Left food wound:  Off antibiotics  Not painful  Covering with regular gauze and bandaid  Worried about "white" skin around the area, not closing up  There is "a lot" of drainage ROS: no fevers/chills, no nausea/vomiting, no numbness/tingling of left foot  # Diabetes  Reports better controlled since leaving hospital, low 70 high 190. Did not bring in log today.   Taking glipizide, metformin, lantus  Denies hypoglycemic symptoms ROS: no polyuria  # Hypertension  Needs refills on medicine  No adverse side effects of medications  Reports compliance with meds  Social Hx: current smoker  Review of Systems   See HPI for ROS.   Past medical history, surgical, family, and social history reviewed and updated in the EMR as appropriate. Objective:  BP 128/67 mmHg  Pulse 96  Temp(Src) 98.3 F (36.8 C) (Oral)  Ht 5\' 6"  (1.676 m)  Wt 153 lb 6 oz (69.57 kg)  BMI 24.77 kg/m2 Vitals and nursing note reviewed  General: NAD CV: RRR, normal s1s2, no murmurs/rub/gallop. 2+ radial, PT pulses bilaterally Resp: clear to auscultation bilaterally, normal effort Ext: no LE edema. Plantar surface left foot at 5th metatarsal with approx 9mm open wound with surrounding area of white/necrotic skin; there is a large amount of friable brown tissue attached to the wound edges but not overlying central part of the wound, no major fluctuance or surrounding erythema. Neuro: alert and oriented, no deficits Skin: no rashes, wound as above Psych: normal mood/affect, normal thought content/speech.  Assessment & Plan:  Diabetic infection of left foot Does not appear to be infected at this time, no indications for continuing antibiotics. However, it does not appear to be healing well with current application of just regular gauze and feel this wound needs further level of  care with debridement, possible use of enzymatic bandages. Will refer to podiatry for more quick referral instead of wound care clinic. Instructed patient to call our clinic if she does not get a call from podiatry over next few days. May benefit from diabetic shoes in the future. Follow up after podiatry.  Uncontrolled type 2 diabetes mellitus Reports improvement in CBG but does not bring in meter or log to verify. Will continue current regimen, refills sent for lantus. Follow up 2-3 months.

## 2014-12-14 ENCOUNTER — Encounter (HOSPITAL_BASED_OUTPATIENT_CLINIC_OR_DEPARTMENT_OTHER): Payer: 59 | Attending: Family Medicine

## 2015-02-03 DIAGNOSIS — L03811 Cellulitis of head [any part, except face]: Secondary | ICD-10-CM

## 2015-02-03 HISTORY — DX: Cellulitis of head (any part, except face): L03.811

## 2015-02-14 ENCOUNTER — Encounter (HOSPITAL_COMMUNITY): Payer: Self-pay | Admitting: Emergency Medicine

## 2015-02-14 ENCOUNTER — Emergency Department (HOSPITAL_COMMUNITY)
Admission: EM | Admit: 2015-02-14 | Discharge: 2015-02-14 | Disposition: A | Discharge status: Home / Self Care | Payer: Self-pay | Attending: Emergency Medicine | Admitting: Emergency Medicine

## 2015-02-14 DIAGNOSIS — Z79899 Other long term (current) drug therapy: Secondary | ICD-10-CM | POA: Insufficient documentation

## 2015-02-14 DIAGNOSIS — Z794 Long term (current) use of insulin: Secondary | ICD-10-CM | POA: Insufficient documentation

## 2015-02-14 DIAGNOSIS — F17210 Nicotine dependence, cigarettes, uncomplicated: Secondary | ICD-10-CM | POA: Insufficient documentation

## 2015-02-14 DIAGNOSIS — I10 Essential (primary) hypertension: Secondary | ICD-10-CM | POA: Insufficient documentation

## 2015-02-14 DIAGNOSIS — L02811 Cutaneous abscess of head [any part, except face]: Secondary | ICD-10-CM | POA: Insufficient documentation

## 2015-02-14 DIAGNOSIS — Z7984 Long term (current) use of oral hypoglycemic drugs: Secondary | ICD-10-CM | POA: Insufficient documentation

## 2015-02-14 DIAGNOSIS — Z8659 Personal history of other mental and behavioral disorders: Secondary | ICD-10-CM | POA: Insufficient documentation

## 2015-02-14 DIAGNOSIS — E119 Type 2 diabetes mellitus without complications: Secondary | ICD-10-CM | POA: Insufficient documentation

## 2015-02-14 MED ORDER — LIDOCAINE HCL (PF) 1 % IJ SOLN
5.0000 mL | Freq: Once | INTRAMUSCULAR | Status: AC
Start: 2015-02-14 — End: 2015-02-14
  Administered 2015-02-14: 5 mL
  Filled 2015-02-14: qty 5

## 2015-02-14 MED ORDER — CEPHALEXIN 500 MG PO CAPS: 500.0000 mg | ORAL_CAPSULE | Freq: Four times a day (QID) | ORAL | Status: DC

## 2015-02-14 MED ORDER — LIDOCAINE-EPINEPHRINE (PF) 2 %-1:200000 IJ SOLN
10.0000 mL | Freq: Once | INTRAMUSCULAR | Status: AC
  Administered 2015-02-14: 10 mL via INTRADERMAL
  Filled 2015-02-14: qty 10

## 2015-02-14 MED ORDER — SULFAMETHOXAZOLE-TRIMETHOPRIM 800-160 MG PO TABS: 1.0000 | ORAL_TABLET | Freq: Two times a day (BID) | ORAL | Status: DC

## 2015-02-14 NOTE — ED Provider Notes (Signed)
CSN: [id number]     Arrival date & time 02/14/15  1425 History  By signing my name below, I, Sara Davis, attest that this documentation has been prepared under the direction and in the presence of [name], PA-C Electronically Signed: Erling Davis, ED Scribe. 02/14/2015. 3:46 PM.   Chief Complaint  Patient presents with  . Abscess   The history is provided by the patient. No language interpreter was used.    HPI Comments: Sara Davis is a 65 y.o. female with a h/o DM who presents to the Emergency Department complaining of a painful and red knot behind her left ear that appeared 2 days and has been gradually increased in size. She rates her pain as a 8/10 in severity. Pt reports associated decreased appetite. She denies any discharge from the area. Pt states the pain is exacerbated with palpation. She took Tylenol this morning with no significant relief. Pt denies any h/o the same. Pt denies any bites or injury to the ear. She denies any fevers, chills, nausea, vomiting, diarrhea, trismus  Past Medical History  Diagnosis Date  . Hypertension   . Diabetes mellitus without complication ([place])   . Depression    Past Surgical History  Procedure Laterality Date  . Abdominal hysterectomy     Family History  Problem Relation Age of Onset  . Diabetes Mother   . Cancer Mother   . Diabetes Sister   . Hypertension Sister   . Cancer Sister 106    Bone  . Diabetes Brother    Social History  Substance Use Topics  . Smoking status: Current Every Day Smoker -- 0.50 packs/day for 40 years    Types: Cigarettes  . Smokeless tobacco: Never Used     Comment: down from 1.5 ppd  . Alcohol Use: No   OB History    No data available     Review of Systems A complete 10 system review of systems was obtained and all systems are negative except as noted in the HPI and PMH.   Allergies  Aspirin  Home Medications   Prior to Admission medications   Medication Sig Start Date End Date  Taking? Authorizing Provider  Blood Glucose Monitoring Suppl (CONTOUR BLOOD GLUCOSE SYSTEM) DEVI Use to test blood sugars once in the morning before food and once in the evening.DX: 250.02 10/17/14   Smiley Houseman, MD  glipiZIDE (GLUCOTROL) 10 MG tablet Take 1 tablet (10 mg total) by mouth daily with breakfast. 10/17/14   Smiley Houseman, MD  glucose blood (ONE TOUCH TEST STRIPS) test strip Please check blood sugar once daily in AM before eating and once daily in PM 10/17/14   Smiley Houseman, MD  insulin glargine (LANTUS) 100 UNIT/ML injection Inject 0.3 mLs (30 Units total) into the skin daily. 11/13/14   Leone Brand, MD  Insulin Pen Needle (B-D ULTRAFINE III SHORT PEN) 31G X 8 MM MISC CHECK BLOOD SUGARS ONCE IN MORNING BEFORE FOOD AND ONCE IN THE EVENING. 10/17/14   Smiley Houseman, MD  Lancets [facility] ULTRASOFT) lancets Please check blood sugar once daily in AM before eating and once daily in PM 06/28/12   Cletus Gash, MD  lisinopril-hydrochlorothiazide (PRINZIDE,ZESTORETIC) 20-25 MG tablet Take 1 tablet by mouth daily. 11/12/14   Leone Brand, MD  metFORMIN (GLUCOPHAGE) 1000 MG tablet Take 1 tablet (1,000 mg total) by mouth 2 (two) times daily with a meal. 10/17/14   Smiley Houseman, MD   Triage Vitals:  BP 190/91 mmHg  Pulse 103  Temp(Src) 98.6 F (37 C) (Oral)  Resp 17  SpO2 99%  Physical Exam  Constitutional: She is oriented to person, place, and time. She appears well-developed and well-nourished. No distress.  HENT:  Head: Normocephalic and atraumatic.  Right Ear: Tympanic membrane, external ear and ear canal normal.  Left Ear: Tympanic membrane, external ear and ear canal normal.  Nose: Nose normal.  Mouth/Throat: Uvula is midline, oropharynx is clear and moist and mucous membranes are normal.  3cm x 4cm Induration noted posterior to L ear with scabbing noted  Eyes: Conjunctivae and EOM are normal.  Neck: Neck supple. No tracheal deviation present.   Pulmonary/Chest: Effort normal. No respiratory distress.  Musculoskeletal: Normal range of motion.  Neurological: She is alert and oriented to person, place, and time.  Skin: Skin is warm and dry.  Psychiatric: She has a normal mood and affect. Her behavior is normal.  Nursing note and vitals reviewed.  ED Course  Procedures (including critical care time)  DIAGNOSTIC STUDIES: Oxygen Saturation is 99% on RA, normal by my interpretation.    COORDINATION OF CARE: 3:49 PM-Discussed laceration repair procedure with pt. Will [place] area. Pt advised of plan for treatment and pt agrees.  Labs Review Labs Reviewed - No data to display  Imaging Review No results found.   EKG Interpretation None      Incision and Drainage Procedure Note Risks and benefits discussed with patient including bleeding, worsening infection, pain, scarring and loss of hair. Patient agrees to procedure Diagnosis/indication: abscess left posterior auricular area Anesthesia: 1% plain lidocaine, 2 cc Procedure Details  The procedure, risks and complications have been discussed in detail (including, but not limited to airway compromise, infection, bleeding) with the patient, and the patient has signed consent to the procedure. The skin was sterilely prepped and draped over the affected area in the usual fashion. After adequate local anesthesia, I&D with a #11 blade was performed on the left posterior auricular area. Purulent drainage: present during lidocaine injection, none with blunt disection The patient was observed until stable. Findings: <1cc white pus drained from scab during lidocaine injection EBL: 1 cc's Drains: none, left open Condition: Tolerated procedure well Complications: none. Follow up scheduled with my clinic 4 days, given return precaution [name], MD 02/14/2015, 4:29 PM PGY-3, [place]  I have reviewed the relevant previous healthcare records. I obtained HPI  from historian.  ED Course:  Assessment: 65y F with abscess noted behind L ear measured 3cm x 4cm w/ scabbing noted. Present for x2days. Discharge noted. No mastoid tenderness. Afebrile. No other symptoms noted. Ultrasound revealed abscess pocket. Family Medicine resident performed an I+D (see note). Minimal drainage. Her PCP performed the I+D and will follow up with her in clinic on Monday at 3pm. Given Bactrim.    Patient is in no acute distress. Vital Signs are stable. Patient is able to ambulate. Patient able to tolerate PO.   Disposition/Plan:  DC Home  Additional Verbal discharge instructions given and discussed with patient.  Pt Instructed to f/u with PCP in the next 48 hours for evaluation and treatment of symptoms. Return precautions given Pt acknowledges and agrees with plan   Supervising Physician Deno Etienne, DO   Final diagnoses:  Abscess, scalp   I personally performed the services described in this documentation, which was scribed in my presence. The recorded information has been reviewed and is accurate.     [name],  PA-C 02/14/15 [place], DO 02/14/15 1752

## 2015-02-14 NOTE — Discharge Instructions (Signed)
Please read and follow all provided instructions.  Your diagnoses today include:  1. Abscess, scalp    Tests performed today include:  Vital signs. See below for your results today.   Medications prescribed:   Take any prescribed medications only as directed.   Home care instructions:   Follow any educational materials contained in this packet  Follow-up instructions: Return to the Emergency Department in 48 hours for a recheck if your symptoms are not significantly improved.  Please follow-up with your primary care provider on Monday (02/18/15) at 3pm   Return instructions:  Return to the Emergency Department if you have:  Fever  Worsening symptoms  Worsening pain  Worsening swelling  Redness of the skin that moves away from the affected area, especially if it streaks away from the affected area   Any other emergent concerns  Additional Information: If you have recurrent abscesses, try both the following. Use a Qtip to apply an over-the-counter antibiotic to the inside of your nostrils, twice a day for 5 days. Wash your body with over-the-counter Hibaclens once a day for one week and then once every two weeks. This can reduce the amount of bacterial on your skin that causes boils and lead to fewer boils. If you continue to have multiple or recurrent boils, you should see a dermatologist (skin doctor).   Your vital signs today were: BP 190/91 mmHg   Pulse 103   Temp(Src) 98.6 F (37 C) (Oral)   Resp 17   SpO2 99% If your blood pressure (BP) was elevated above 135/85 this visit, please have this repeated by your doctor within one month. --------------

## 2015-02-14 NOTE — ED Notes (Signed)
Pt has redness and swelling behind left ear. Center of redness is a scab. No draniage.

## 2015-02-14 NOTE — ED Notes (Signed)
Pt stable, ambulatory, states understanding of discharge instructions 

## 2015-02-18 ENCOUNTER — Encounter: Payer: Self-pay | Admitting: Family Medicine

## 2015-02-18 ENCOUNTER — Ambulatory Visit (INDEPENDENT_AMBULATORY_CARE_PROVIDER_SITE_OTHER): Payer: Self-pay | Admitting: Family Medicine

## 2015-02-18 VITALS — BP 188/89 | HR 94 | Temp 98.6°F | Ht 66.0 in | Wt 146.0 lb

## 2015-02-18 DIAGNOSIS — E1165 Type 2 diabetes mellitus with hyperglycemia: Secondary | ICD-10-CM

## 2015-02-18 DIAGNOSIS — L989 Disorder of the skin and subcutaneous tissue, unspecified: Secondary | ICD-10-CM

## 2015-02-18 DIAGNOSIS — I10 Essential (primary) hypertension: Secondary | ICD-10-CM

## 2015-02-18 DIAGNOSIS — L97509 Non-pressure chronic ulcer of other part of unspecified foot with unspecified severity: Secondary | ICD-10-CM

## 2015-02-18 DIAGNOSIS — IMO0002 Reserved for concepts with insufficient information to code with codable children: Secondary | ICD-10-CM

## 2015-02-18 DIAGNOSIS — Z794 Long term (current) use of insulin: Secondary | ICD-10-CM

## 2015-02-18 DIAGNOSIS — E11621 Type 2 diabetes mellitus with foot ulcer: Secondary | ICD-10-CM

## 2015-02-18 LAB — POCT GLYCOSYLATED HEMOGLOBIN (HGB A1C): HEMOGLOBIN A1C: 13

## 2015-02-18 MED ORDER — CLINDAMYCIN HCL 300 MG PO CAPS: 300.0000 mg | ORAL_CAPSULE | Freq: Four times a day (QID) | ORAL | Status: DC

## 2015-02-18 MED ORDER — LISINOPRIL-HYDROCHLOROTHIAZIDE 20-25 MG PO TABS: 1.0000 | ORAL_TABLET | Freq: Every day | ORAL | Status: DC

## 2015-02-18 NOTE — Progress Notes (Signed)
   Subjective:    Patient ID: Sara Davis, female    DOB: [birth year], 65 y.o.   MRN: [id number]  HPI  CC: pain in scalp / ED follow up for ?abscess  # Pain/swelling of scalp:  Left side behind ear.  Started about 10 days ago. She went to the ED 4 days ago because that morning she woke up with a lot worse pain all of a sudden  Denies any drainage  No fevers or chills, nausea or vomiting. Feels fine otherwise.   Pain is worse when pushing, trying to sleep (keeping her awake)  Denies any pain with jaw movement, ear movement, no changes in hearing  In ED attempted I&D over small area of fluctuance with minimal drainage, there was some pus-like fluid that drained from a scab on injection of lidocaine  She has been taking her bactrim given in the ED as prescribed without any missed doses  Social Hx: current smoker  Review of Systems   See HPI for ROS.   Past medical history, surgical, family, and social history reviewed and updated in the EMR as appropriate. Objective:  BP 188/89 mmHg  Pulse 94  Temp(Src) 98.6 F (37 C) (Oral)  Ht 5\' 6"  (1.676 m)  Wt 146 lb (66.225 kg)  BMI 23.58 kg/m2 Vitals and nursing note reviewed  General: NAD, pleasant Skin: see pictures below. Primary area of swelling is approx 4.5cm x 2.5cm, there is some more swelling/induration noted beyond this that measures about 6.5cm x 4.5cm. Not much appreciable fluid/fluctuance, it is "spongy". No warmth. There is some crusting/scab and scaling. Erythema extends further out to the ear. There is also some hair loss noted.        Assessment & Plan:  Scalp lesion Not exactly clear etiology. Not improving on bactrim. ?abscess but not warm, not much true appreciable fluctuance. Does have an appearance of kerion, however unusual in an adult? No associated lymphadenopathy either.   Since she is overall well appearing right now, we will switch bactrim to clindamycin and have her follow up in 3 days to  evaluate response. Will need to consider addition of oral antifungal if really not improving, may also consider bedside ultrasound to look for fluid/abscess but with the location and size most likely would need higher level of care for I&D than in clinic. Given strict return precautions.

## 2015-02-18 NOTE — Patient Instructions (Signed)
Stop the Bactrim, start takign the Clindamycin 4 times a day. Come back to clinic in about 2-3 days to see how you are doing.  If you develop fevers, nausea, vomiting, feeling very sick, please go to the ED.

## 2015-02-19 DIAGNOSIS — L989 Disorder of the skin and subcutaneous tissue, unspecified: Secondary | ICD-10-CM | POA: Insufficient documentation

## 2015-02-19 NOTE — Assessment & Plan Note (Signed)
Not exactly clear etiology. Not improving on bactrim. ?abscess but not warm, not much true appreciable fluctuance. Does have an appearance of kerion, however unusual in an adult? No associated lymphadenopathy either.   Since she is overall well appearing right now, we will switch bactrim to clindamycin and have her follow up in 3 days to evaluate response. Will need to consider addition of oral antifungal if really not improving, may also consider bedside ultrasound to look for fluid/abscess but with the location and size most likely would need higher level of care for I&D than in clinic. Given strict return precautions.

## 2015-02-22 ENCOUNTER — Inpatient Hospital Stay (HOSPITAL_COMMUNITY)
Admission: EM | Admit: 2015-02-22 | Discharge: 2015-02-24 | DRG: 603 | Disposition: A | Discharge status: Home / Self Care | Payer: Self-pay | Attending: Family Medicine | Admitting: Family Medicine

## 2015-02-22 ENCOUNTER — Encounter (HOSPITAL_COMMUNITY): Payer: Self-pay | Admitting: *Deleted

## 2015-02-22 ENCOUNTER — Emergency Department (HOSPITAL_COMMUNITY): Payer: Self-pay

## 2015-02-22 DIAGNOSIS — Z833 Family history of diabetes mellitus: Secondary | ICD-10-CM

## 2015-02-22 DIAGNOSIS — L02811 Cutaneous abscess of head [any part, except face]: Principal | ICD-10-CM | POA: Diagnosis present

## 2015-02-22 DIAGNOSIS — I2510 Atherosclerotic heart disease of native coronary artery without angina pectoris: Secondary | ICD-10-CM | POA: Diagnosis present

## 2015-02-22 DIAGNOSIS — D649 Anemia, unspecified: Secondary | ICD-10-CM | POA: Diagnosis present

## 2015-02-22 DIAGNOSIS — Z794 Long term (current) use of insulin: Secondary | ICD-10-CM

## 2015-02-22 DIAGNOSIS — E1122 Type 2 diabetes mellitus with diabetic chronic kidney disease: Secondary | ICD-10-CM | POA: Diagnosis present

## 2015-02-22 DIAGNOSIS — E1140 Type 2 diabetes mellitus with diabetic neuropathy, unspecified: Secondary | ICD-10-CM | POA: Diagnosis present

## 2015-02-22 DIAGNOSIS — Z8249 Family history of ischemic heart disease and other diseases of the circulatory system: Secondary | ICD-10-CM

## 2015-02-22 DIAGNOSIS — L03811 Cellulitis of head [any part, except face]: Secondary | ICD-10-CM | POA: Diagnosis present

## 2015-02-22 DIAGNOSIS — F17210 Nicotine dependence, cigarettes, uncomplicated: Secondary | ICD-10-CM | POA: Diagnosis present

## 2015-02-22 DIAGNOSIS — R739 Hyperglycemia, unspecified: Secondary | ICD-10-CM | POA: Insufficient documentation

## 2015-02-22 DIAGNOSIS — N183 Chronic kidney disease, stage 3 (moderate): Secondary | ICD-10-CM | POA: Diagnosis present

## 2015-02-22 DIAGNOSIS — N179 Acute kidney failure, unspecified: Secondary | ICD-10-CM | POA: Diagnosis present

## 2015-02-22 DIAGNOSIS — I129 Hypertensive chronic kidney disease with stage 1 through stage 4 chronic kidney disease, or unspecified chronic kidney disease: Secondary | ICD-10-CM | POA: Diagnosis present

## 2015-02-22 DIAGNOSIS — E1165 Type 2 diabetes mellitus with hyperglycemia: Secondary | ICD-10-CM | POA: Diagnosis present

## 2015-02-22 DIAGNOSIS — Z808 Family history of malignant neoplasm of other organs or systems: Secondary | ICD-10-CM

## 2015-02-22 DIAGNOSIS — L0291 Cutaneous abscess, unspecified: Secondary | ICD-10-CM | POA: Diagnosis present

## 2015-02-22 DIAGNOSIS — Z720 Tobacco use: Secondary | ICD-10-CM | POA: Diagnosis present

## 2015-02-22 DIAGNOSIS — I10 Essential (primary) hypertension: Secondary | ICD-10-CM | POA: Diagnosis present

## 2015-02-22 DIAGNOSIS — IMO0002 Reserved for concepts with insufficient information to code with codable children: Secondary | ICD-10-CM | POA: Diagnosis present

## 2015-02-22 HISTORY — DX: Cellulitis of head (any part, except face): L03.811

## 2015-02-22 LAB — BASIC METABOLIC PANEL
Anion gap: 12 (ref 5–15)
BUN: 26 mg/dL — AB (ref 6–20)
CALCIUM: 9.6 mg/dL (ref 8.9–10.3)
CO2: 23 mmol/L (ref 22–32)
Chloride: 100 mmol/L — ABNORMAL LOW (ref 101–111)
Creatinine, Ser: 1.29 mg/dL — ABNORMAL HIGH (ref 0.44–1.00)
GFR calc Af Amer: 50 mL/min — ABNORMAL LOW (ref >60)
GFR, EST NON AFRICAN AMERICAN: 43 mL/min — AB (ref >60)
GLUCOSE: 404 mg/dL — AB (ref 65–99)
Potassium: 5 mmol/L (ref 3.5–5.1)
Sodium: 135 mmol/L (ref 135–145)

## 2015-02-22 LAB — CBG MONITORING, ED
Glucose-Capillary: 348 mg/dL — ABNORMAL HIGH (ref 65–99)
Glucose-Capillary: 282 mg/dL — ABNORMAL HIGH (ref 65–99)

## 2015-02-22 LAB — GLUCOSE, CAPILLARY
GLUCOSE-CAPILLARY: 266 mg/dL — AB (ref 65–99)
Glucose-Capillary: 231 mg/dL — ABNORMAL HIGH (ref 65–99)

## 2015-02-22 LAB — CBC WITH DIFFERENTIAL/PLATELET
BASOS ABS: 0 10*3/uL (ref 0.0–0.1)
BASOS PCT: 0 %
EOS PCT: 1 %
Eosinophils Absolute: 0.1 10*3/uL (ref 0.0–0.7)
HEMATOCRIT: 34.6 % — AB (ref 36.0–46.0)
Hemoglobin: 11.5 g/dL — ABNORMAL LOW (ref 12.0–15.0)
LYMPHS PCT: 32 %
Lymphs Abs: 3.3 10*3/uL (ref 0.7–4.0)
MCH: 27.6 pg (ref 26.0–34.0)
MCHC: 33.2 g/dL (ref 30.0–36.0)
MCV: 83 fL (ref 78.0–100.0)
MONO ABS: 0.4 10*3/uL (ref 0.1–1.0)
MONOS PCT: 4 %
NEUTROS ABS: 6.5 10*3/uL (ref 1.7–7.7)
Neutrophils Relative %: 63 %
PLATELETS: 410 10*3/uL — AB (ref 150–400)
RBC: 4.17 MIL/uL (ref 3.87–5.11)
RDW: 12.3 % (ref 11.5–15.5)
WBC: 10.3 10*3/uL (ref 4.0–10.5)

## 2015-02-22 MED ORDER — GABAPENTIN 600 MG PO TABS
300.0000 mg | ORAL_TABLET | Freq: Three times a day (TID) | ORAL | Status: DC
  Filled 2015-02-22 (×2): qty 0.5

## 2015-02-22 MED ORDER — INSULIN GLARGINE 100 UNIT/ML ~~LOC~~ SOLN
30.0000 [IU] | Freq: Every day | SUBCUTANEOUS | Status: DC
  Administered 2015-02-22 – 2015-02-23 (×2): 30 [IU] via SUBCUTANEOUS
  Filled 2015-02-22 (×3): qty 0.3

## 2015-02-22 MED ORDER — CLINDAMYCIN PHOSPHATE 600 MG/50ML IV SOLN
600.0000 mg | Freq: Three times a day (TID) | INTRAVENOUS | Status: DC
  Administered 2015-02-22 – 2015-02-23 (×3): 600 mg via INTRAVENOUS
  Filled 2015-02-22 (×5): qty 50

## 2015-02-22 MED ORDER — IOHEXOL 300 MG/ML  SOLN
75.0000 mL | Freq: Once | INTRAMUSCULAR | Status: AC | PRN
  Administered 2015-02-22: 75 mL via INTRAVENOUS

## 2015-02-22 MED ORDER — CLINDAMYCIN PHOSPHATE 600 MG/50ML IV SOLN
600.0000 mg | Freq: Once | INTRAVENOUS | Status: AC
  Administered 2015-02-22: 600 mg via INTRAVENOUS
  Filled 2015-02-22: qty 50

## 2015-02-22 MED ORDER — INSULIN ASPART 100 UNIT/ML ~~LOC~~ SOLN
0.0000 [IU] | Freq: Three times a day (TID) | SUBCUTANEOUS | Status: DC
  Administered 2015-02-22: 5 [IU] via SUBCUTANEOUS
  Administered 2015-02-23: 15 [IU] via SUBCUTANEOUS

## 2015-02-22 MED ORDER — FENTANYL CITRATE (PF) 100 MCG/2ML IJ SOLN
50.0000 ug | Freq: Once | INTRAMUSCULAR | Status: AC
  Administered 2015-02-22: 50 ug via INTRAVENOUS
  Filled 2015-02-22: qty 2

## 2015-02-22 MED ORDER — LISINOPRIL 20 MG PO TABS
20.0000 mg | ORAL_TABLET | Freq: Every day | ORAL | Status: DC
  Administered 2015-02-22 – 2015-02-24 (×3): 20 mg via ORAL
  Filled 2015-02-22 (×3): qty 1

## 2015-02-22 MED ORDER — ENOXAPARIN SODIUM 40 MG/0.4ML ~~LOC~~ SOLN
40.0000 mg | SUBCUTANEOUS | Status: DC
  Administered 2015-02-22 – 2015-02-23 (×2): 40 mg via SUBCUTANEOUS
  Filled 2015-02-22 (×3): qty 0.4

## 2015-02-22 MED ORDER — INSULIN ASPART 100 UNIT/ML ~~LOC~~ SOLN
10.0000 [IU] | Freq: Once | SUBCUTANEOUS | Status: DC
  Filled 2015-02-22: qty 1

## 2015-02-22 MED ORDER — ACETAMINOPHEN 500 MG PO TABS
1000.0000 mg | ORAL_TABLET | Freq: Four times a day (QID) | ORAL | Status: DC | PRN
  Administered 2015-02-22 – 2015-02-24 (×3): 1000 mg via ORAL
  Filled 2015-02-22 (×3): qty 2

## 2015-02-22 MED ORDER — SODIUM CHLORIDE 0.9 % IV BOLUS (SEPSIS)
1000.0000 mL | Freq: Once | INTRAVENOUS | Status: AC
  Administered 2015-02-22: 1000 mL via INTRAVENOUS

## 2015-02-22 MED ORDER — HYDROCHLOROTHIAZIDE 25 MG PO TABS
25.0000 mg | ORAL_TABLET | Freq: Every day | ORAL | Status: DC
  Administered 2015-02-22 – 2015-02-24 (×3): 25 mg via ORAL
  Filled 2015-02-22 (×3): qty 1

## 2015-02-22 MED ORDER — GABAPENTIN 300 MG PO CAPS
300.0000 mg | ORAL_CAPSULE | Freq: Three times a day (TID) | ORAL | Status: DC
  Administered 2015-02-22 – 2015-02-24 (×6): 300 mg via ORAL
  Filled 2015-02-22 (×6): qty 1

## 2015-02-22 MED ORDER — INSULIN ASPART 100 UNIT/ML ~~LOC~~ SOLN
0.0000 [IU] | Freq: Every day | SUBCUTANEOUS | Status: DC
  Administered 2015-02-22: 3 [IU] via SUBCUTANEOUS

## 2015-02-22 NOTE — H&P (Signed)
[facility] Admission History and Physical Service Pager: [phone]  Patient name: Sara Davis Medical record number: [id number] Date of birth: 1950/07/29 Age: 65 y.o. Gender: female  Primary Care Provider: [name], MD Consultants: None Code Status: Full  Chief Complaint: Cellulitis  Assessment and Plan: Sara Davis is a 65 y.o. female presenting with celluluitis . PMH is significant for T2DM, HTN,   Cellulitis- Status post bactrim with current PO clindamycin treatment with minimal improvement per patient. CT in the ED was read as cellulitis with swelling, no abscess. However, on palpation during examination we were able to express purulent fluid which was cultured. Given we were able to express some purulent fluid, Radiology was called to discuss the CT read again. They agreed with the original reading of cellulitis but no abscess. However, her lack of improvement with antibiotics is likely due to persistent abscess pocket, which is now draining. WBC 10.3, afebrile in the ED with no reported fevers at home, She was started on Clindamycin IV in the ED.  - Admit to teaching service, Dr Ree Kida attending - continue IV clindamycin to convert to oral in AM. Could consider tranisitioning to oral clindamycin today given equivalent bioavailability and no issues tolerating PO, however, will maintain IV for now and consider transitioning to oral potentially later today - follow abscess drainage, consider further wound exploration of concern for abscess loculation - f/u drainage culture, adjust antibiotics as needed - no blood culture obtained given she has been on antibiotics since 1/12 - will continue tylenol for pain control as it was helping at home, consider stronger pain relief as needed  DM2- Poorly controlled with A1c 1/16 was 13.0. Now with symptoms of neuropathy. Diabetes likely contributing to risk for abscess formation - continue home lantus 30 qD -  moderate SSI - gabapentin 300 TID - will hold metformin 48 hours after CT with contrast ( on 1/20 at 5:20 AM)  HTN- Blood pressure above goal at 158/86, however has not yet had her AM antihypertensive - Will continue home lisinopril 20, HCTZ 25 qD  Tobacco abuse- Currently smoke approximately 1 ppd - Will start nicotine patch as needed  FEN/GI: SLIV, carb modified diet Prophylaxis: lovenox  Disposition: Admit to teaching service  History of Present Illness:  Sara Davis is a 65 y.o. female presenting with cellulitis  She first noted a small "bump" behind her left ear too weeks ago which initially was not painful but began to grow and become painful. She presented to the ED for this on 1/12 where she was noted to have 3x4 cm abscess for which she had an I&D with minimal fluid drained,. She was started on bactrim at that time. She then followed with her PCP on 1/16 for continued pain and swelling and was transitioned to clindamycin for therapy Today she presents for continued pain and swelling, She has tried tylenol for the pain which has helped but she is frustrated that she still has pain when the tylenol wears off and that she still has significant swelling of the area. She feels that her pain and swelling have not worsened, just have not gotten better. She denies fevers, chest pain, SOB, N/V/D. She does endorse some loose stool since starting the clindamycin. The stool is brown in color, with no black or bright red blood in it  Of note she also states she started to notice tingling in her bilateral fingers approximately 1 week ago. She denies weakness, tingling of her  toes, headache, neck stiffness, or neck pain   Review Of Systems: Per HPI with the following additions:  - Denies headache, changes in vision, abdominal pain Otherwise the remainder of the systems were negative.  Patient Active Problem List   Diagnosis Date Noted  . Scalp lesion 02/19/2015  . Essential hypertension    . Type 2 diabetes mellitus with foot ulcer ([place])   . Diabetic infection of left foot ([place])   . Headache 04/18/2014  . Pain of left great toe 01/19/2014  . Insomnia 07/31/2013  . Uncontrolled type 2 diabetes mellitus ([place]) 01/14/2012  . Tobacco abuse 01/14/2012    Past Medical History: Past Medical History  Diagnosis Date  . Hypertension   . Diabetes mellitus without complication ([place])   . Depression     Past Surgical History: Past Surgical History  Procedure Laterality Date  . Abdominal hysterectomy      Social History: Additional social history: smokes 1ppd per report, occasional etoh use with last beer " months ago", denies elicit drug use Please also refer to relevant sections of EMR.  Family History: Family History  Problem Relation Age of Onset  . Diabetes Mother   . Cancer Mother   . Diabetes Sister   . Hypertension Sister   . Cancer Sister 73    Bone  . Diabetes Brother     Allergies and Medications: Allergies  Allergen Reactions  . Aspirin     Upset stomach   No current facility-administered medications on file prior to encounter.   Current Outpatient Prescriptions on File Prior to Encounter  Medication Sig Dispense Refill  . Blood Glucose Monitoring Suppl (CONTOUR BLOOD GLUCOSE SYSTEM) DEVI Use to test blood sugars once in the morning before food and once in the evening.DX: 250.02 1 Device 0  . clindamycin (CLEOCIN) 300 MG capsule Take 1 capsule (300 mg total) by mouth 4 (four) times daily. 28 capsule 0  . glipiZIDE (GLUCOTROL) 10 MG tablet Take 1 tablet (10 mg total) by mouth daily with breakfast. 30 tablet 0  . glucose blood (ONE TOUCH TEST STRIPS) test strip Please check blood sugar once daily in AM before eating and once daily in PM 100 each 12  . insulin glargine (LANTUS) 100 UNIT/ML injection Inject 0.3 mLs (30 Units total) into the skin daily. 10 mL 12  . Insulin Pen Needle (B-D ULTRAFINE III SHORT PEN) 31G X 8 MM MISC CHECK BLOOD SUGARS ONCE IN  MORNING BEFORE FOOD AND ONCE IN THE EVENING. 100 each 0  . Lancets (ONETOUCH ULTRASOFT) lancets Please check blood sugar once daily in AM before eating and once daily in PM 100 each 12  . lisinopril-hydrochlorothiazide (PRINZIDE,ZESTORETIC) 20-25 MG tablet Take 1 tablet by mouth daily. 90 tablet 3  . metFORMIN (GLUCOPHAGE) 1000 MG tablet Take 1 tablet (1,000 mg total) by mouth 2 (two) times daily with a meal. 60 tablet 0    Objective: BP 151/85 mmHg  Pulse 76  Temp(Src) 97.8 F (36.6 C) (Oral)  Resp 16  SpO2 98% Exam: General: NAD, lying in bed HEENT: approximately 4 cm in greatest diameter circular swelling behind left ear, mildly erythematous with scabbing overlying, + fluctuance, draining white purulent fluid, no cervical lymphadenopathy, EOMI, normal oropharynx Cardiovascular: RRR, no murmurs Respiratory: CTAB Abdomen: soft, non-tender, non-distended, + BS MSK: normal range of motion of the neck, negative spurling's test bilateraly, no neck tenderness Skin: no rashes or lesions Neuro: no focal deficits Psych: normal mood and affect  Labs and Imaging: CBC  BMET   Recent Labs Lab 02/22/15 0420  WBC 10.3  HGB 11.5*  HCT 34.6*  PLT 410*    Recent Labs Lab 02/22/15 0420  NA 135  K 5.0  CL 100*  CO2 23  BUN 26*  CREATININE 1.29*  GLUCOSE 404*  CALCIUM 9.6     CT head:  IMPRESSION: Focal LEFT occipital scalp swelling/cellulitis without abscess or CT findings of mastoiditis.  Negative CT head with contrast for age.     Veatrice Bourbon, MD 02/22/2015, 9:32 AM PGY-2, [place] [facility] pager: [phone], text pages welcome

## 2015-02-22 NOTE — ED Notes (Signed)
Family medicine at bedside.

## 2015-02-22 NOTE — ED Notes (Signed)
CBG 348. 

## 2015-02-22 NOTE — ED Notes (Signed)
Dinner ordered for patient.

## 2015-02-22 NOTE — ED Notes (Signed)
Diet ordered 

## 2015-02-22 NOTE — ED Notes (Signed)
CBG 282 

## 2015-02-22 NOTE — ED Notes (Signed)
Pt c/o abscess behind left hear. States she saw her MD on Monday, attempted to drain abscess with no relief. Also changed her antibiotics. Pt states the abscess has not gone down.

## 2015-02-22 NOTE — ED Provider Notes (Signed)
CSN: [id number]     Arrival date & time 02/22/15  0013 History   First MD Initiated Contact with Patient 02/22/15 0359     Chief Complaint  Patient presents with  . Abscess     Patient is a 65 y.o. female presenting with abscess. The history is provided by the patient. No language interpreter was used.  Abscess  Sara Davis is a 65 y.o. female who presents to the Emergency Department complaining of scalp swelling.  She reports two weeks of swelling and pain behind her left ear.  She has been seen by her doctor and in the Emergency Department, last visit was about a week ago.  She had the area drained and was put on bactrim.  She was recently changed to clindamycin because the abx was not working.  She reports malaise and decreased appetite, pain at the site.  No vomiting, fevers, neck pain.  Sxs are moderate, constant, worsening.    Past Medical History  Diagnosis Date  . Hypertension   . Diabetes mellitus without complication ([place])   . Depression    Past Surgical History  Procedure Laterality Date  . Abdominal hysterectomy     Family History  Problem Relation Age of Onset  . Diabetes Mother   . Cancer Mother   . Diabetes Sister   . Hypertension Sister   . Cancer Sister 37    Bone  . Diabetes Brother    Social History  Substance Use Topics  . Smoking status: Current Every Day Smoker -- 0.50 packs/day for 40 years    Types: Cigarettes  . Smokeless tobacco: Never Used     Comment: down from 1.5 ppd  . Alcohol Use: No   OB History    No data available     Review of Systems  All other systems reviewed and are negative.     Allergies  Aspirin  Home Medications   Prior to Admission medications   Medication Sig Start Date End Date Taking? Authorizing Provider  acetaminophen (TYLENOL) 500 MG tablet Take 1,000 mg by mouth every 6 (six) hours as needed for mild pain or fever.   Yes Historical Provider, MD  Blood Glucose Monitoring Suppl (CONTOUR BLOOD GLUCOSE  SYSTEM) DEVI Use to test blood sugars once in the morning before food and once in the evening.DX: 250.02 10/17/14  Yes Smiley Houseman, MD  clindamycin (CLEOCIN) 300 MG capsule Take 1 capsule (300 mg total) by mouth 4 (four) times daily. 02/18/15  Yes Leone Brand, MD  glipiZIDE (GLUCOTROL) 10 MG tablet Take 1 tablet (10 mg total) by mouth daily with breakfast. 10/17/14  Yes Smiley Houseman, MD  glucose blood (ONE TOUCH TEST STRIPS) test strip Please check blood sugar once daily in AM before eating and once daily in PM 10/17/14  Yes Smiley Houseman, MD  insulin glargine (LANTUS) 100 UNIT/ML injection Inject 0.3 mLs (30 Units total) into the skin daily. 11/13/14  Yes Leone Brand, MD  Insulin Pen Needle (B-D ULTRAFINE III SHORT PEN) 31G X 8 MM MISC CHECK BLOOD SUGARS ONCE IN MORNING BEFORE FOOD AND ONCE IN THE EVENING. 10/17/14  Yes Smiley Houseman, MD  Lancets [facility] ULTRASOFT) lancets Please check blood sugar once daily in AM before eating and once daily in PM 06/28/12  Yes Cletus Gash, MD  lisinopril-hydrochlorothiazide (PRINZIDE,ZESTORETIC) 20-25 MG tablet Take 1 tablet by mouth daily. 02/18/15  Yes Leone Brand, MD  metFORMIN (GLUCOPHAGE) 1000 MG tablet Take 1  tablet (1,000 mg total) by mouth 2 (two) times daily with a meal. 10/17/14  Yes Smiley Houseman, MD   BP 168/76 mmHg  Pulse 76  Temp(Src) 97.8 F (36.6 C) (Oral)  Resp 16  SpO2 97% Physical Exam  Constitutional: She is oriented to person, place, and time. She appears well-developed and well-nourished.  HENT:  Head: Normocephalic and atraumatic.  TMS obscured by cerumen bilaterally.  Large area behind the left ear with fluctuance, erythema and tenderness.  approx 5 cm in diameter.   Eyes: Pupils are equal, round, and reactive to light.  Cardiovascular: Normal rate and regular rhythm.   No murmur heard. Pulmonary/Chest: Effort normal and breath sounds normal. No respiratory distress.  Abdominal: Soft.  There is no tenderness. There is no rebound and no guarding.  Musculoskeletal: She exhibits no edema or tenderness.  Neurological: She is alert and oriented to person, place, and time.  Skin: Skin is warm and dry.  Psychiatric: She has a normal mood and affect. Her behavior is normal.  Nursing note and vitals reviewed.   ED Course  Procedures (including critical care time) Labs Review Labs Reviewed  BASIC METABOLIC PANEL - Abnormal; Notable for the following:    Chloride 100 (*)    Glucose, Bld 404 (*)    BUN 26 (*)    Creatinine, Ser 1.29 (*)    GFR calc non Af Amer 43 (*)    GFR calc Af Amer 50 (*)    All other components within normal limits  CBC WITH DIFFERENTIAL/PLATELET - Abnormal; Notable for the following:    Hemoglobin 11.5 (*)    HCT 34.6 (*)    Platelets 410 (*)    All other components within normal limits  CBG MONITORING, ED    Imaging Review Ct Head W Contrast  02/22/2015  CLINICAL DATA:  Swollen area near LEFT ear for 2 weeks, assess for abscess. History of diabetes. EXAM: CT HEAD WITH CONTRAST TECHNIQUE: Contiguous axial images were obtained from the base of the skull through the vertex with intravenous contrast. CONTRAST:  67mL OMNIPAQUE IOHEXOL 300 MG/ML  SOLN COMPARISON:  None. FINDINGS: Focal LEFT occipital scalp edema and effusion without rim enhancing fluid collection. No subcutaneous gas or radiopaque foreign bodies. Note underlying mass. Area corresponds to skin marker. The ventricles and sulci are normal for age. No intraparenchymal hemorrhage, mass effect nor midline shift. No acute large vascular territory infarcts. No abnormal intracranial enhancement. No abnormal extra-axial fluid collections. Basal cisterns are patent. No skull fracture or destructive bony lesions. The included ocular globes and orbital contents are non-suspicious. The mastoid aircells and included paranasal sinuses are well-aerated. IMPRESSION: Focal LEFT occipital scalp swelling/cellulitis  without abscess or CT findings of mastoiditis. Negative CT head with contrast for age. Electronically Signed   By: Elon Alas M.D.   On: 02/22/2015 05:51   I have personally reviewed and evaluated these images and lab results as part of my medical decision-making.   EKG Interpretation None      MDM   Final diagnoses:  Cellulitis of scalp  Hyperglycemia    Patient with history of diabetes here for evaluation of posterior auricular swelling, pain as well as generalized malaise and nausea. Examination is concerning for abscess that CT scan imaging demonstrates cellulitis, no evidence of mastoiditis. Patient is noted to be markedly hyperglycemic to 404. Treated with IV antibiotics with plan to admit to the family medicine service for further treatment.  Quintella Reichert, MD 02/22/15 [phone]

## 2015-02-23 LAB — CBC
HCT: 32.8 % — ABNORMAL LOW (ref 36.0–46.0)
Hemoglobin: 10.8 g/dL — ABNORMAL LOW (ref 12.0–15.0)
MCH: 27.6 pg (ref 26.0–34.0)
MCHC: 32.9 g/dL (ref 30.0–36.0)
MCV: 83.9 fL (ref 78.0–100.0)
PLATELETS: 439 10*3/uL — AB (ref 150–400)
RBC: 3.91 MIL/uL (ref 3.87–5.11)
RDW: 12.5 % (ref 11.5–15.5)
WBC: 9.8 10*3/uL (ref 4.0–10.5)

## 2015-02-23 LAB — GLUCOSE, CAPILLARY
GLUCOSE-CAPILLARY: 171 mg/dL — AB (ref 65–99)
GLUCOSE-CAPILLARY: 363 mg/dL — AB (ref 65–99)
GLUCOSE-CAPILLARY: 142 mg/dL — AB (ref 65–99)
Glucose-Capillary: 445 mg/dL — ABNORMAL HIGH (ref 65–99)
Glucose-Capillary: 424 mg/dL — ABNORMAL HIGH (ref 65–99)

## 2015-02-23 LAB — BASIC METABOLIC PANEL
Anion gap: 10 (ref 5–15)
BUN: 19 mg/dL (ref 6–20)
CHLORIDE: 100 mmol/L — AB (ref 101–111)
CO2: 27 mmol/L (ref 22–32)
CREATININE: 1.29 mg/dL — AB (ref 0.44–1.00)
Calcium: 9.3 mg/dL (ref 8.9–10.3)
GFR calc Af Amer: 50 mL/min — ABNORMAL LOW (ref >60)
GFR calc non Af Amer: 43 mL/min — ABNORMAL LOW (ref >60)
Glucose, Bld: 431 mg/dL — ABNORMAL HIGH (ref 65–99)
Potassium: 4.6 mmol/L (ref 3.5–5.1)
Sodium: 137 mmol/L (ref 135–145)

## 2015-02-23 MED ORDER — LIDOCAINE-EPINEPHRINE 1 %-1:100000 IJ SOLN
10.0000 mL | Freq: Once | INTRAMUSCULAR | Status: AC
  Administered 2015-02-23: 10 mL via INTRADERMAL
  Filled 2015-02-23: qty 10

## 2015-02-23 MED ORDER — VANCOMYCIN HCL IN DEXTROSE 750-5 MG/150ML-% IV SOLN
750.0000 mg | INTRAVENOUS | Status: DC
  Filled 2015-02-23: qty 150

## 2015-02-23 MED ORDER — INSULIN GLARGINE 100 UNIT/ML ~~LOC~~ SOLN
40.0000 [IU] | Freq: Every day | SUBCUTANEOUS | Status: DC
Start: 2015-02-24 — End: 2015-02-24
  Administered 2015-02-24: 40 [IU] via SUBCUTANEOUS
  Filled 2015-02-23 (×2): qty 0.4

## 2015-02-23 MED ORDER — PIPERACILLIN-TAZOBACTAM 3.375 G IVPB
3.3750 g | Freq: Three times a day (TID) | INTRAVENOUS | Status: DC
  Administered 2015-02-23 – 2015-02-24 (×3): 3.375 g via INTRAVENOUS
  Filled 2015-02-23 (×6): qty 50

## 2015-02-23 MED ORDER — INSULIN GLARGINE 100 UNIT/ML ~~LOC~~ SOLN
10.0000 [IU] | Freq: Once | SUBCUTANEOUS | Status: AC
  Administered 2015-02-23: 10 [IU] via SUBCUTANEOUS
  Filled 2015-02-23: qty 0.1

## 2015-02-23 MED ORDER — INSULIN ASPART 100 UNIT/ML ~~LOC~~ SOLN
0.0000 [IU] | Freq: Three times a day (TID) | SUBCUTANEOUS | Status: DC
  Administered 2015-02-23: 20 [IU] via SUBCUTANEOUS
  Administered 2015-02-23: 4 [IU] via SUBCUTANEOUS
  Administered 2015-02-24: 15 [IU] via SUBCUTANEOUS
  Administered 2015-02-24: 7 [IU] via SUBCUTANEOUS

## 2015-02-23 MED ORDER — MORPHINE SULFATE (PF) 2 MG/ML IV SOLN
2.0000 mg | INTRAVENOUS | Status: DC | PRN
  Administered 2015-02-23 (×2): 2 mg via INTRAVENOUS
  Filled 2015-02-23 (×2): qty 1

## 2015-02-23 MED ORDER — VANCOMYCIN HCL 10 G IV SOLR
1250.0000 mg | Freq: Once | INTRAVENOUS | Status: AC
  Administered 2015-02-23: 1250 mg via INTRAVENOUS
  Filled 2015-02-23: qty 1250

## 2015-02-23 NOTE — Progress Notes (Signed)
CRITICAL VALUE ALERT  Critical value received:  CBG: 445  Date of notification:  02/23/15  Time of notification:  0815  Critical value read back:Yes.    Nurse who received alert:  Kathrine Cords  MD notified (1st page):  Dr. Ree Kida  Time of first page:  0815  Responding MD:  Dr. Ree Kida  Time MD responded:  [phone]-- gave orders to administer 15 units of Novalog. See [facility]

## 2015-02-23 NOTE — Progress Notes (Signed)
ANTIBIOTIC CONSULT NOTE - INITIAL  Pharmacy Consult for Zosyn and vancomycin Indication: Cellulitis / Abscess  Allergies  Allergen Reactions  . Aspirin     Upset stomach    Patient Measurements: Height: 5\' 6"  (167.6 cm) Weight: 145 lb 1 oz (65.8 kg) IBW/kg (Calculated) : 59.3  Vital Signs: Temp: 97.5 F (36.4 C) (01/21 0533) Temp Source: Oral (01/21 0533) BP: 166/78 mmHg (01/21 0533) Pulse Rate: 76 (01/21 0533) Intake/Output from previous day: 01/20 0701 - 01/21 0700 In: [id number] [P.O.:1320; IV Piggyback:150] Out: 900 [Urine:900] Intake/Output from this shift:    Labs:  Recent Labs  02/22/15 0420 02/23/15 0528  WBC 10.3 9.8  HGB 11.5* 10.8*  PLT 410* 439*  CREATININE 1.29* 1.29*   Estimated Creatinine Clearance: 41.8 mL/min (by C-G formula based on Cr of 1.29). No results for input(s): VANCOTROUGH, VANCOPEAK, VANCORANDOM, GENTTROUGH, GENTPEAK, GENTRANDOM, TOBRATROUGH, TOBRAPEAK, TOBRARND, AMIKACINPEAK, AMIKACINTROU, AMIKACIN in the last 72 hours.   Microbiology: Recent Results (from the past 720 hour(s))  Culture, routine-abscess     Status: None (Preliminary result)   Collection Time: 02/22/15 11:21 AM  Result Value Ref Range Status   Specimen Description ABSCESS LEFT HEAD  Final   Special Requests Normal  Final   Gram Stain PENDING  Incomplete   Culture   Final    NO GROWTH 1 DAY Performed at [facility]    Report Status PENDING  Incomplete    Medical History: Past Medical History  Diagnosis Date  . Hypertension   . Diabetes mellitus without complication ([place])   . Depression   . Cellulitis of scalp 02/2015   Assessment: 64 yo F presents on 1/20 with cellulitis. CT of the head shows occipital swelling /cellulitis without abscess. Pharmacy consulted to dose abx. Afebrile, WBC wnl. SCr 1.29, CrCl ~47ml/min.   Goal of Therapy:  Vancomycin trough level 10-15 mcg/ml  Resolution of infection  Plan:  Start Zosyn 3.375 gm IV q8h (4 hour  infusion) Give vancomycin 1.25g IV x 1, then start vancomycin 750mg  IV Q24 Monitor clinical picture, renal function, VT prn F/U C&S, abx deescalation / LOT  Elenor Quinones, PharmD, BCPS Clinical Pharmacist Pager [phone] 02/23/2015 10:20 AM

## 2015-02-23 NOTE — Procedures (Signed)
Verbal Informed Consent obtained.  Area cleaned with iodine x 3 and wiped clear with alcohol swab.  Using 23 gauge needle approximately 15 cc of lidocaine were used for anesthesia of the abscess area. Approximately 0.5 cm incision was made near the center of the abscess which immediately began draining a large amount of purulent fluid. The abscess was explored with hemostat for deloculation. Contents were expressed. No packing was placed. Pt. Tolerated the procedure well. After cleaning the area, a sterile gauze dressing was placed over the abscess to allow it to continue to drain. No complications. All sharps and utensils accounted for.     Paula Compton, MD Family Medicine  - PGY 2  Supervising Attending:  Dr. Dossie Arbour MD

## 2015-02-23 NOTE — Progress Notes (Signed)
Family Medicine Teaching Service Daily Progress Note Intern Pager: [phone]  Patient name: Sara Davis Medical record number: [id number] Date of birth: 11/18/1950 Age: 65 y.o. Gender: female  Primary Care Provider: [name], MD Consultants: None Code Status: Full  Pt Overview and Major Events to Date:  1/20: admitted with abscess / cellulitis of left scalp.  1/21: No adavancement of lesion. Broadened Abx to Vanc / Zosyn. Drainage.   Assessment and Plan: [name] is a 65 y.o. female presenting with celluluitis . PMH is significant for T2DM, HTN,   Cellulitis with likely abscess- pt. Had been on bactrim and clindamycin po prior to coming in to the hospital. Lesion was tender and draining prior to admission, so only placed on IV antibiotics. CT without frank pocket of fluid to drain. Currently, pt. Says she feels it is about the same, but not worsening. Exam without tenderness over the area. It is somewhat fluctuant. No further drainage at this time. WBC 9.8 - Admit to teaching service, Dr Ree Kida attending - Broadened to [facility] / Zosyn given that she had already been taking clinda and little IV utility vs. PO. Little improvement. Careful with slightly rising creatinine. 1.2 - May try to I&D today. If able.  - Cx NG x 1 day.  - Warm compresses.  - will continue tylenol for pain control as it was helping at home, consider stronger pain relief as needed - CT without invasion of deeper structures.   DM2- Poorly controlled with A1c 1/16 was 13.0. Now with symptoms of neuropathy. Diabetes likely contributing to risk for abscess formation. CBG in the 400's this am.  - continue home lantus 30 qD - resistant SSI - gabapentin 300 TID - will hold metformin 48 hours after CT with contrast ( on 1/20 at 5:20 AM)  HTN- Blood pressure above goal at 158/86, however has not yet had her AM antihypertensive - Will continue home lisinopril 20, HCTZ 25 qD  Tobacco abuse- Currently smoke  approximately 1 ppd - Will start nicotine patch as needed  FEN/GI: SLIV, carb modified diet Prophylaxis: lovenox   Disposition: will attempt drainage today, and continue IV abx overnight.   Subjective:  Pt. Feels that the area on her head is not necessarily better, but not worse either. She says that it "softens up during the day". She says the drainage has stopped. She denies fever, chills, it is no longer tender. She says she is agreeable to drainage today if it will help. She does not have any other systemic symptoms such as nausea, chills, or rigors. No palpitations or dizziness.   Objective: Temp:  [97.5 F (36.4 C)-98.4 F (36.9 C)] 97.5 F (36.4 C) (01/21 0533) Pulse Rate:  [71-86] 76 (01/21 0533) Resp:  [16-18] 18 (01/21 0533) BP: (115-177)/(49-99) 166/78 mmHg (01/21 0533) SpO2:  [94 %-100 %] 98 % (01/21 0533) Weight:  [145 lb 1 oz (65.8 kg)] 145 lb 1 oz (65.8 kg) (01/20 1559) Physical Exam: General: NAD, AAOx3, Resting comfortably in bed.  Cardiovascular: RRR, no MGR, normal S1/S2, 2+ distal pulses.  Respiratory: CTA B, Appropriate rate, unlabored.  Abdomen: S, NT, ND, +BS.  Extremities: WWP, 2+ distal pulses.  Skin: Left Posterior Scalp with large area of swelling, somewhat fluctuant, dried drainage on the surface without active drainage, still somewhat red, but erythema stops at the edges of the swelling and does not extend. It is not tender to palpation. Demarcated with marker this am. No visible bulge in the patient's ear, no  ear pain, no mastoid tenderness. Neck ROM is full.   Laboratory:  Recent Labs Lab 02/22/15 0420 02/23/15 0528  WBC 10.3 9.8  HGB 11.5* 10.8*  HCT 34.6* 32.8*  PLT 410* 439*    Recent Labs Lab 02/22/15 0420  NA 135  K 5.0  CL 100*  CO2 23  BUN 26*  CREATININE 1.29*  CALCIUM 9.6  GLUCOSE 404*   Wound Cx 1/20: NGTD x 1 day.    Imaging/Diagnostic Tests: 02/23/2015 CT Head:  IMPRESSION: Focal LEFT occipital scalp  swelling/cellulitis without abscess or CT findings of mastoiditis.  Negative CT head with contrast for age  Aquilla Hacker, MD 02/23/2015, 7:11 AM PGY-2, [place] Intern pager: [phone], text pages welcome

## 2015-02-23 NOTE — Progress Notes (Signed)
Repeat CBG 424.  Patient is asymptomatic.  MD notified, awaiting new orders.

## 2015-02-24 ENCOUNTER — Other Ambulatory Visit: Payer: Self-pay | Admitting: Family Medicine

## 2015-02-24 DIAGNOSIS — Z794 Long term (current) use of insulin: Secondary | ICD-10-CM

## 2015-02-24 DIAGNOSIS — L97509 Non-pressure chronic ulcer of other part of unspecified foot with unspecified severity: Secondary | ICD-10-CM

## 2015-02-24 DIAGNOSIS — Z720 Tobacco use: Secondary | ICD-10-CM

## 2015-02-24 DIAGNOSIS — E11621 Type 2 diabetes mellitus with foot ulcer: Secondary | ICD-10-CM

## 2015-02-24 DIAGNOSIS — I10 Essential (primary) hypertension: Secondary | ICD-10-CM

## 2015-02-24 DIAGNOSIS — E1165 Type 2 diabetes mellitus with hyperglycemia: Secondary | ICD-10-CM

## 2015-02-24 LAB — BASIC METABOLIC PANEL
Anion gap: 8 (ref 5–15)
BUN: 26 mg/dL — AB (ref 6–20)
CALCIUM: 9.2 mg/dL (ref 8.9–10.3)
CO2: 24 mmol/L (ref 22–32)
CREATININE: 1.16 mg/dL — AB (ref 0.44–1.00)
Chloride: 106 mmol/L (ref 101–111)
GFR calc Af Amer: 57 mL/min — ABNORMAL LOW (ref >60)
GFR, EST NON AFRICAN AMERICAN: 49 mL/min — AB (ref >60)
Glucose, Bld: 306 mg/dL — ABNORMAL HIGH (ref 65–99)
Potassium: 4.3 mmol/L (ref 3.5–5.1)
SODIUM: 138 mmol/L (ref 135–145)

## 2015-02-24 LAB — GLUCOSE, CAPILLARY
GLUCOSE-CAPILLARY: 244 mg/dL — AB (ref 65–99)
Glucose-Capillary: 304 mg/dL — ABNORMAL HIGH (ref 65–99)

## 2015-02-24 LAB — LIPID PANEL
Cholesterol: 84 mg/dL (ref 0–200)
HDL: 31 mg/dL — ABNORMAL LOW (ref >40)
LDL CALC: 5 mg/dL (ref 0–99)
Total CHOL/HDL Ratio: 2.7 RATIO
Triglycerides: 239 mg/dL — ABNORMAL HIGH (ref <150)
VLDL: 48 mg/dL — ABNORMAL HIGH (ref 0–40)

## 2015-02-24 LAB — CBC
HCT: 29 % — ABNORMAL LOW (ref 36.0–46.0)
Hemoglobin: 9.7 g/dL — ABNORMAL LOW (ref 12.0–15.0)
MCH: 27.9 pg (ref 26.0–34.0)
MCHC: 33.4 g/dL (ref 30.0–36.0)
MCV: 83.3 fL (ref 78.0–100.0)
PLATELETS: 363 10*3/uL (ref 150–400)
RBC: 3.48 MIL/uL — AB (ref 3.87–5.11)
RDW: 12.5 % (ref 11.5–15.5)
WBC: 10.1 10*3/uL (ref 4.0–10.5)

## 2015-02-24 LAB — IRON AND TIBC
IRON: 52 ug/dL (ref 28–170)
SATURATION RATIOS: 20 % (ref 10.4–31.8)
TIBC: 262 ug/dL (ref 250–450)
UIBC: 210 ug/dL

## 2015-02-24 LAB — FERRITIN: FERRITIN: 23 ng/mL (ref 11–307)

## 2015-02-24 MED ORDER — LIVING WELL WITH DIABETES BOOK
Freq: Once | Status: AC
  Administered 2015-02-24: 10:00:00
  Filled 2015-02-24: qty 1

## 2015-02-24 MED ORDER — CLINDAMYCIN HCL 300 MG PO CAPS
450.0000 mg | ORAL_CAPSULE | Freq: Three times a day (TID) | ORAL | Status: DC
  Filled 2015-02-24 (×3): qty 1

## 2015-02-24 MED ORDER — CLINDAMYCIN HCL 150 MG PO CAPS: 450.0000 mg | ORAL_CAPSULE | Freq: Three times a day (TID) | ORAL | Status: AC

## 2015-02-24 MED ORDER — HYDROCODONE-ACETAMINOPHEN 5-325 MG PO TABS: 1.0000 | ORAL_TABLET | Freq: Four times a day (QID) | ORAL | Status: DC | PRN

## 2015-02-24 MED ORDER — INSULIN GLARGINE 100 UNIT/ML ~~LOC~~ SOLN: 40.0000 [IU] | Freq: Every day | SUBCUTANEOUS | Status: DC

## 2015-02-24 MED ORDER — ASPIRIN EC 81 MG PO TBEC: 81.0000 mg | DELAYED_RELEASE_TABLET | Freq: Every day | ORAL | Status: DC

## 2015-02-24 MED ORDER — GABAPENTIN 300 MG PO CAPS: 300.0000 mg | ORAL_CAPSULE | Freq: Three times a day (TID) | ORAL | Status: DC

## 2015-02-24 NOTE — Discharge Planning (Signed)
Patient discharged home in stable condition. Verbalizes understanding of all discharge instructions, including home medications and follow up appointments. 

## 2015-02-24 NOTE — Discharge Instructions (Signed)
Continue to take your antibiotics clindamycin three times a day; You last dose will be on 1/28.17. You have a follow-up appointment with Dr Lamar Benes for recheck of your infection on 1/24 @ 1:30pm.  - Your Lantus was increased to 40units - Recommend stopping all sugary drinks: Regular soda, sweet tea, Gatorade, Juices - Check your blood sugar every morning prior to eating or taking medications, and bring your meter and blood sugar reading to your doctor's appointment   Cellulitis Cellulitis is an infection of the skin and the tissue beneath it. The infected area is usually red and tender. Cellulitis occurs most often in the arms and lower legs.  CAUSES  Cellulitis is caused by bacteria that enter the skin through cracks or cuts in the skin. The most common types of bacteria that cause cellulitis are staphylococci and streptococci. SIGNS AND SYMPTOMS   Redness and warmth.  Swelling.  Tenderness or pain.  Fever. DIAGNOSIS  Your health care provider can usually determine what is wrong based on a physical exam. Blood tests may also be done. TREATMENT  Treatment usually involves taking an antibiotic medicine. HOME CARE INSTRUCTIONS   Take your antibiotic medicine as directed by your health care provider. Finish the antibiotic even if you start to feel better.  Keep the infected arm or leg elevated to reduce swelling.  Apply a warm cloth to the affected area up to 4 times per day to relieve pain.  Take medicines only as directed by your health care provider.  Keep all follow-up visits as directed by your health care provider. SEEK MEDICAL CARE IF:   You notice red streaks coming from the infected area.  Your red area gets larger or turns dark in color.  Your bone or joint underneath the infected area becomes painful after the skin has healed.  Your infection returns in the same area or another area.  You notice a swollen bump in the infected area.  You develop new symptoms.  You  have a fever. SEEK IMMEDIATE MEDICAL CARE IF:   You feel very sleepy.  You develop vomiting or diarrhea.  You have a general ill feeling (malaise) with muscle aches and pains.   This information is not intended to replace advice given to you by your health care provider. Make sure you discuss any questions you have with your health care provider.   Document Released: 10/29/2004 Document Revised: 10/10/2014 Document Reviewed: 04/06/2011 Elsevier Interactive Patient Education [employer].

## 2015-02-24 NOTE — Discharge Summary (Signed)
[facility] Discharge Summary  Patient name: Sara Davis Medical record number: [id number] Date of birth: Nov 24, 1950 Age: 65 y.o. Gender: female Date of Admission: 02/22/2015  Date of Discharge: 02/24/15 Admitting Physician: Lupita Dawn, MD  Primary Care Provider: [name], MD Consultants: None  Indication for Hospitalization: Abscess  Discharge Diagnoses/Problem List:  Scalp Abscess and Cellulitis DM2 w/ neuropathy  AoCKD stage 3 Anemia HTN Tobacco abuse  Disposition: Home  Discharge Condition: Stable  Discharge Exam: See Progress Note  Brief Hospital Course:  Sara Davis is a 65 y.o. female who presented with scalp abscess and celluluitis on 1/20. PMH is significant for T2DM, HTN, and tobacco abuse. See was treated with IV antibiotics and I&D on 1/21. Transition to PO clindamycin and discharged with PCP follow-up.    Issues for Follow Up:  1. Recheck Cellulitis and adherence to Clindamycin (Last dose will be 1/28 to complete 10 day course) 2. DM2 Poorly controlled.  1. Discuss stopping regular sodas. Met with DM educator in hospital 2. Recommend apt with Dr Valentina Lucks for DM and Smoking cessation 3. ASCVD risk = 23%. Recommend starting Statin. F/u lipid panel 4. Follow-up iron panel for anemia 5. AoCKD. On Lisinopril. Consider rechecking Cr 6. F/u Cultures  Significant Procedures: Scalp I&D 1/21  Significant Labs and Imaging:   Recent Labs Lab 02/22/15 0420 02/23/15 0528 02/24/15 0524  WBC 10.3 9.8 10.1  HGB 11.5* 10.8* 9.7*  HCT 34.6* 32.8* 29.0*  PLT 410* 439* 363    Recent Labs Lab 02/22/15 0420 02/23/15 0528 02/24/15 0524  NA 135 137 138  K 5.0 4.6 4.3  CL 100* 100* 106  CO2 _0 GLUCOSE 404* 431* 306*  BUN 26* 19 26*  CREATININE 1.29* 1.29* 1.16*  CALCIUM 9.6 9.3 9.2   Results/Tests Pending at Time of Discharge: Cultures  Discharge Medications:    Medication List    TAKE these medications        acetaminophen 500 MG tablet  Commonly known as:  TYLENOL  Take 1,000 mg by mouth every 6 (six) hours as needed for mild pain or fever.     aspirin EC 81 MG tablet  Take 1 tablet (81 mg total) by mouth daily.     clindamycin 150 MG capsule  Commonly known as:  CLEOCIN  Take 3 capsules (450 mg total) by mouth every 8 (eight) hours.     CONTOUR BLOOD GLUCOSE SYSTEM Devi  Use to test blood sugars once in the morning before food and once in the evening.DX: 250.02     gabapentin 300 MG capsule  Commonly known as:  NEURONTIN  Take 1 capsule (300 mg total) by mouth 3 (three) times daily.     glipiZIDE 10 MG tablet  Commonly known as:  GLUCOTROL  Take 1 tablet (10 mg total) by mouth daily with breakfast.     glucose blood test strip  Commonly known as:  ONE TOUCH TEST STRIPS  Please check blood sugar once daily in AM before eating and once daily in PM     insulin glargine 100 UNIT/ML injection  Commonly known as:  LANTUS  Inject 0.4 mLs (40 Units total) into the skin daily.     Insulin Pen Needle 31G X 8 MM Misc  Commonly known as:  B-D ULTRAFINE III SHORT PEN  CHECK BLOOD SUGARS ONCE IN MORNING BEFORE FOOD AND ONCE IN THE EVENING.     lisinopril-hydrochlorothiazide 20-25 MG tablet  Commonly known  as:  PRINZIDE,ZESTORETIC  Take 1 tablet by mouth daily.     metFORMIN 1000 MG tablet  Commonly known as:  GLUCOPHAGE  Take 1 tablet (1,000 mg total) by mouth 2 (two) times daily with a meal.     onetouch ultrasoft lancets  Please check blood sugar once daily in AM before eating and once daily in PM        Discharge Instructions: Please refer to Patient Instructions section of EMR for full details.  Patient was counseled important signs and symptoms that should prompt return to medical care, changes in medications, dietary instructions, activity restrictions, and follow up appointments.   Follow-Up Appointments: Follow-up Information    Follow up with [name], MD On 02/26/2015.    Specialty:  Family Medicine   Why:  Appointment at 1:30 for hosptial follow-up and diabetes   Contact information:   [place] [address] [phone]       Follow-up Information    Follow up with [name], MD On 02/26/2015.   Specialty:  Family Medicine   Why:  Appointment at 1:30 for hosptial follow-up and diabetes   Contact information:   [place] [address] [phone]      Olam Idler, MD 02/24/2015, 11:32 AM PGY-3, [place]

## 2015-02-24 NOTE — Progress Notes (Signed)
Family Medicine Teaching Service Daily Progress Note Intern Pager: [phone]  Patient name: Sara Davis Medical record number: [id number] Date of birth: [birth year] Age: 65 y.o. Gender: female  Primary Care Provider: [name], MD Consultants: None Code Status: Full  Pt Overview and Major Events to Date:  1/20: admitted with abscess / cellulitis of left scalp.  1/21: No adavancement of lesion. Broadened Abx to Vanc / Zosyn. Drainage.  1/22: Transition to PO abx  Assessment and Plan: MINNAH Davis is a 65 y.o. female presenting with celluluitis . PMH is significant for T2DM, HTN,   Cellulitis with likely abscess- pt. Had been on bactrim and clindamycin po prior to coming in to the hospital. Lesion was tender and draining prior to admission, so only placed on IV antibiotics. CT without frank pocket of fluid to drain. I&D performed 1/21 - Admit to teaching service, Dr Ree Kida attending - CT without invasion of deeper structures.  - Warm compresses - Abx: Transition to PO Clindamycin 1/22. (Clinda 1/19-1/20) (Vanc/Zosyn 1/21)  - Cx NG x 1 day.  - Pain control: tylenol  DM2- Poorly controlled with A1c 1/16 was 13.0. Now with symptoms of neuropathy. Diabetes likely contributing to risk for abscess formation.   - continue home lantus 30 qD - resistant SSI - gabapentin 300 TID - will hold metformin 48 hours after CT with contrast ( on 1/20 at 5:20 AM) - f/u lipid panel - ASA 81mg   AoCKD stage 3a. Baseline Cr 0.9 - Cr: Improving. 1.16 (1/22)   Anemia. Normocytic. Baseline ~ 9.8 - Hgb 9.7 (1/22) - f/u iron panel  HTN- At goal - Will continue home lisinopril 20, HCTZ 25 qD  Tobacco abuse- Currently smoke approximately 1 ppd - Will start nicotine patch as needed - discuss smoking cessation  FEN/GI: SLIV, carb modified diet Prophylaxis: lovenox  Disposition: Transition to PO Pain meds and Abx. Likely dc home today  Subjective:  Doing well. Pain improved. Asking to go home.    Objective: Temp:  [98.6 F (37 C)-98.9 F (37.2 C)] 98.6 F (37 C) (01/22 0437) Pulse Rate:  [71-89] 71 (01/22 0437) Resp:  [17-18] 18 (01/22 0437) BP: (118-127)/(59-81) 125/60 mmHg (01/22 0437) SpO2:  [95 %-97 %] 96 % (01/22 0437) Physical Exam: General: NAD, AAOx3, Resting comfortably in bed.  Cardiovascular: RRR, no MGR, normal S1/S2, 2+ distal pulses.  Respiratory: CTA B, Appropriate rate, unlabored.  Abdomen: S, NT, ND, +BS.  Extremities: WWP, 2+ distal pulses.  Skin: Left Posterior Scalp s/p I&D wrapped. Mild tenderness No ear pain, no mastoid tenderness.  Neck ROM is full.   Laboratory:  Recent Labs Lab 02/22/15 0420 02/23/15 0528 02/24/15 0524  WBC 10.3 9.8 10.1  HGB 11.5* 10.8* 9.7*  HCT 34.6* 32.8* 29.0*  PLT 410* 439* 363    Recent Labs Lab 02/22/15 0420 02/23/15 0528 02/24/15 0524  NA 135 137 138  K 5.0 4.6 4.3  CL 100* 100* 106  CO2 23 27 24   BUN 26* 19 26*  CREATININE 1.29* 1.29* 1.16*  CALCIUM 9.6 9.3 9.2  GLUCOSE 404* 431* 306*   Wound Cx 1/20: NGTD x 1 day.   Imaging/Diagnostic Tests: 02/23/2015 CT Head:  IMPRESSION: Focal LEFT occipital scalp swelling/cellulitis without abscess or CT findings of mastoiditis.  Negative CT head with contrast for age  Olam Idler, MD 02/24/2015, 8:06 AM PGY-3, [place] Intern pager: [phone], text pages welcome

## 2015-02-26 ENCOUNTER — Ambulatory Visit (INDEPENDENT_AMBULATORY_CARE_PROVIDER_SITE_OTHER): Payer: Self-pay | Admitting: Family Medicine

## 2015-02-26 ENCOUNTER — Encounter: Payer: Self-pay | Admitting: Family Medicine

## 2015-02-26 VITALS — BP 166/86 | HR 83 | Temp 98.1°F | Ht 66.0 in | Wt 153.0 lb

## 2015-02-26 DIAGNOSIS — L0291 Cutaneous abscess, unspecified: Secondary | ICD-10-CM

## 2015-02-26 LAB — CULTURE, ROUTINE-ABSCESS: SPECIAL REQUESTS: NORMAL

## 2015-02-26 NOTE — Assessment & Plan Note (Signed)
Actively draining pus today, some expressed with "milking" the area. Discussed with her that she needs to have the area I&D'd again to allow for adequate drainage because it is such a large area of fluctuance; she declines this as she does not want to undergo the needle stick for lidocaine. Attempted to pack the small draining area but pain was too much and only able to to get about 65mm inside; this was covered with a bandaid to help keep it in. Instructed patient on correct dosage for the clindamycin, needs to take 3 capsules per dose, not 1 cap as she had been; continue warm compresses. She has an appointment tomorrow to get this looked at again and try to convince her to let us open further and pack to assist with drainage (may try and convince her to see if needleless injection/infusion of lidocaine into the abscess? At the very least to get more packing in). Told her to expect office visits throughout the week.

## 2015-02-26 NOTE — Progress Notes (Signed)
   Subjective:    Patient ID: Genevie Ann, female    DOB: 01-14-1951, 65 y.o.   MRN: [id number]  HPI  CC: hospital follow up   # Abscess:  Says it feels much better, doesn't complain of a lot of pain but is still a little tender if pushed on too much  Still draining pus  Says she has been doing the warm compresses  Says she is taking the clindamycin ONE tablet (150mg ) three times a day (instructions state to take 3 tabs/450mg  three times a day)  Still using warm compresses ROS: no fevers, no nausea, no vomiting  Social Hx: current smoker  Review of Systems   See HPI for ROS.   Past medical history, surgical, family, and social history reviewed and updated in the EMR as appropriate. Objective:  BP 166/86 mmHg  Pulse 83  Temp(Src) 98.1 F (36.7 C) (Oral)  Ht 5\' 6"  (1.676 m)  Wt 153 lb (69.4 kg)  BMI 24.71 kg/m2 Vitals and nursing note reviewed  General: no apparent distress  Skin: large area of fluctuance posterior left scalp behind ear, there is draining pus. Area only mildly tender. Surrounding erythema does not appear to be extending beyond area outlined by marker from the hospital  Assessment & Plan:  Abscess Actively draining pus today, some expressed with "milking" the area. Discussed with her that she needs to have the area I&D'd again to allow for adequate drainage because it is such a large area of fluctuance; she declines this as she does not want to undergo the needle stick for lidocaine. Attempted to pack the small draining area but pain was too much and only able to to get about 67mm inside; this was covered with a bandaid to help keep it in. Instructed patient on correct dosage for the clindamycin, needs to take 3 capsules per dose, not 1 cap as she had been; continue warm compresses. She has an appointment tomorrow to get this looked at again and try to convince her to let us open further and pack to assist with drainage (may try and convince her to see if  needleless injection/infusion of lidocaine into the abscess? At the very least to get more packing in). Told her to expect office visits throughout the week.

## 2015-02-27 ENCOUNTER — Ambulatory Visit (INDEPENDENT_AMBULATORY_CARE_PROVIDER_SITE_OTHER): Payer: Self-pay | Admitting: Family Medicine

## 2015-02-27 ENCOUNTER — Encounter: Payer: Self-pay | Admitting: Family Medicine

## 2015-02-27 VITALS — BP 162/72 | HR 94 | Temp 97.7°F | Wt 152.1 lb

## 2015-02-27 DIAGNOSIS — L0291 Cutaneous abscess, unspecified: Secondary | ICD-10-CM

## 2015-02-27 DIAGNOSIS — L02811 Cutaneous abscess of head [any part, except face]: Secondary | ICD-10-CM

## 2015-02-27 MED ORDER — DOXYCYCLINE HYCLATE 100 MG PO TABS: 100.0000 mg | ORAL_TABLET | Freq: Two times a day (BID) | ORAL | Status: DC

## 2015-02-27 NOTE — Progress Notes (Signed)
Patient ID: Sara Davis, female   DOB: 07-Sep-1950, 65 y.o.   MRN: [id number]   [facility] Aquilla Hacker, MD Phone: [phone]  Subjective:   # Follow Up Head Abscess - is somewhat smaller than in the hospital.  - some reaccumulation of purulent fluid in side of the abscess.  - bandaid overlying without drainage. Not draining well.  - She did not want to have it drained by Dr. Lamar Benes yesterday, Does not want it drained today either.  - no fever, no chills, no pain, it is not spreading, the erythema is clearing up.  - She continues to have very poorly controlled blood sugar and is apparently not taking her lantus at home since leaving the hospital.  - she has also not been taking her antibiotic appropriately. She says that it made her nauseated, so she took only three pills scattered throughout the day instead of three pills three times per day.  - She says overall it feels better than it did initially, and does not feel worse.  - No facial numbness, or weakness.   All relevant systems were reviewed and were negative unless otherwise noted in the HPI  Past Medical History Reviewed problem list.  Medications- reviewed and updated Current Outpatient Prescriptions  Medication Sig Dispense Refill  . acetaminophen (TYLENOL) 500 MG tablet Take 1,000 mg by mouth every 6 (six) hours as needed for mild pain or fever.    . Blood Glucose Monitoring Suppl (CONTOUR BLOOD GLUCOSE SYSTEM) DEVI Use to test blood sugars once in the morning before food and once in the evening.DX: 250.02 1 Device 0  . clindamycin (CLEOCIN) 150 MG capsule Take 3 capsules (450 mg total) by mouth every 8 (eight) hours. 21 capsule 0  . gabapentin (NEURONTIN) 300 MG capsule TAKE 1 CAPSULE(300 MG) BY MOUTH THREE TIMES DAILY 270 capsule 2  . glipiZIDE (GLUCOTROL) 10 MG tablet Take 1 tablet (10 mg total) by mouth daily with breakfast. 30 tablet 0  . glucose blood (ONE TOUCH TEST STRIPS) test strip  Please check blood sugar once daily in AM before eating and once daily in PM 100 each 12  . insulin glargine (LANTUS) 100 UNIT/ML injection Inject 0.4 mLs (40 Units total) into the skin daily. 10 mL 12  . Insulin Pen Needle (B-D ULTRAFINE III SHORT PEN) 31G X 8 MM MISC CHECK BLOOD SUGARS ONCE IN MORNING BEFORE FOOD AND ONCE IN THE EVENING. 100 each 0  . Lancets (ONETOUCH ULTRASOFT) lancets Please check blood sugar once daily in AM before eating and once daily in PM 100 each 12  . lisinopril-hydrochlorothiazide (PRINZIDE,ZESTORETIC) 20-25 MG tablet Take 1 tablet by mouth daily. 90 tablet 3  . metFORMIN (GLUCOPHAGE) 1000 MG tablet Take 1 tablet (1,000 mg total) by mouth 2 (two) times daily with a meal. 60 tablet 0  . doxycycline (VIBRA-TABS) 100 MG tablet Take 1 tablet (100 mg total) by mouth 2 (two) times daily. 20 tablet 0   No current facility-administered medications for this visit.   Chief complaint-noted No additions to family history Social history- patient is a non smoker  Objective: BP 162/72 mmHg  Pulse 94  Temp(Src) 97.7 F (36.5 C) (Oral)  Wt 152 lb 1.6 oz (68.992 kg) Gen: NAD, alert, cooperative with exam HEENT: Abscess over left posterior head. Smaller than before but still with some fluctuant collection. Bandaid over opening, no drainage, erythema improved from when I saw it last, and nontender to palpation which is a  big improvement, EOMI, PERRL, TMs nml, no mastoid tenderness, no LAD palpable. Neck: FROM, supple, no LAD palpable.  CV: RRR, good S1/S2, no murmur Resp: CTABL, no wheezes, non-labored Abd: SNTND, BS present, no guarding or organomegaly Ext: No edema, warm, normal tone, moves UE/LE spontaneously Neuro: Alert and oriented, No gross deficits Skin: no rashes , lesion as above.   Assessment/Plan: See problem based a/p Abscess Pt. With continued abscess / collection on the left side of her head. She again refuses I&D today. She has apparently not been taking her  antibiotic appropriately. She says that it "makes her sick" to take too many pills at once. The infection is not progressing based on exam, and clinical symptoms. Cx with MRSA supposedly sensitive to clindamycin. Also sensitive to Doxycycline. Will switch her to Doxycycline which is one pill twice daily. She is agreeable to this. Will have her take this for 10 days.  - Follow up 1 week, earlier if pain, worse swelling, fever, or chills develop.  - I&D if not improving.  - Doxycycline 100mg  BID x 10 days RX'd.  - Return precautions reviewed.

## 2015-02-27 NOTE — Patient Instructions (Signed)
Thanks for letting us take care of you.   We switched your antibiotic from Clindamycin to Doxycycline   You will take one pill every 12 hours. (twice daily).   Take the antibiotic until it is gone.   We will see you back in one week.   In 3 weeks come back to see your primary doctor about getting better control of your diabetes.   Thanks for letting us take care of you.   Sincerely,  Paula Compton, MD Family Medicine - PGY 2

## 2015-02-27 NOTE — Assessment & Plan Note (Signed)
Pt. With continued abscess / collection on the left side of her head. She again refuses I&D today. She has apparently not been taking her antibiotic appropriately. She says that it "makes her sick" to take too many pills at once. The infection is not progressing based on exam, and clinical symptoms. Cx with MRSA supposedly sensitive to clindamycin. Also sensitive to Doxycycline. Will switch her to Doxycycline which is one pill twice daily. She is agreeable to this. Will have her take this for 10 days.  - Follow up 1 week, earlier if pain, worse swelling, fever, or chills develop.  - I&D if not improving.  - Doxycycline 100mg  BID x 10 days RX'd.  - Return precautions reviewed.

## 2015-03-08 ENCOUNTER — Ambulatory Visit: Payer: Self-pay | Admitting: Family Medicine

## 2015-03-20 ENCOUNTER — Ambulatory Visit: Payer: Self-pay | Admitting: Family Medicine

## 2015-07-04 ENCOUNTER — Ambulatory Visit: Payer: Self-pay | Admitting: Family Medicine

## 2015-07-25 ENCOUNTER — Encounter: Payer: Self-pay | Admitting: Family Medicine

## 2015-07-25 ENCOUNTER — Ambulatory Visit (INDEPENDENT_AMBULATORY_CARE_PROVIDER_SITE_OTHER): Payer: Self-pay | Admitting: Family Medicine

## 2015-07-25 VITALS — BP 158/66 | HR 84 | Temp 98.1°F | Ht 66.0 in | Wt 162.8 lb

## 2015-07-25 DIAGNOSIS — E1165 Type 2 diabetes mellitus with hyperglycemia: Secondary | ICD-10-CM

## 2015-07-25 DIAGNOSIS — B351 Tinea unguium: Secondary | ICD-10-CM

## 2015-07-25 DIAGNOSIS — I10 Essential (primary) hypertension: Secondary | ICD-10-CM

## 2015-07-25 DIAGNOSIS — E118 Type 2 diabetes mellitus with unspecified complications: Secondary | ICD-10-CM

## 2015-07-25 LAB — POCT GLYCOSYLATED HEMOGLOBIN (HGB A1C): Hemoglobin A1C: 11.6

## 2015-07-25 MED ORDER — AMLODIPINE BESYLATE 5 MG PO TABS: 5.0000 mg | ORAL_TABLET | Freq: Every day | ORAL | Status: DC

## 2015-07-25 MED ORDER — METFORMIN HCL 1000 MG PO TABS: 1000.0000 mg | ORAL_TABLET | Freq: Two times a day (BID) | ORAL | Status: DC

## 2015-07-25 MED ORDER — INSULIN NPH ISOPHANE & REGULAR (70-30) 100 UNIT/ML ~~LOC~~ SUSP: SUBCUTANEOUS | Status: DC

## 2015-07-25 NOTE — Patient Instructions (Signed)
Insulin 70/30: Take twice a day about 12 hours apart. Start with 20 units in the morning and 10 units at night. Make sure you are checking your sugars when waking up (fasting), lunch time, dinner time/bedtime. Record these in a log and bring with you in 2 weeks  Blood pressure: added Amlodipine 5mg , take once daily. Continue lisinopril-hydrochlorothiazide.

## 2015-07-25 NOTE — Progress Notes (Signed)
   Subjective:    Patient ID: Sara Davis, female    DOB: [birth year], 65 y.o.   MRN: [id number]  HPI  CC: follow up   # DM:  Ran out of lantus, was using sister's. Denies any missed doses of this  Still taking metformin, not having any major side effects  Not taking glipizide  Eye doctor: saw at [facility], she does not recall them doing a retina exam.  ROS: no polyuria or polydipsia  # Hypertension  States she took her medicine today (lisinopril-hctz)  Denies any side effects from the medicine ROS: no CP, no SOB  # Onychomycosis  Both big toenails  Bothers her from   Social Hx: current smoker  Review of Systems   See HPI for ROS.   Past medical history, surgical, family, and social history reviewed and updated in the EMR as appropriate. Objective:  BP 158/66 mmHg  Pulse 84  Temp(Src) 98.1 F (36.7 C) (Oral)  Ht 5\' 6"  (1.676 m)  Wt 162 lb 12.8 oz (73.846 kg)  BMI 26.29 kg/m2 Vitals and nursing note reviewed  General: no apparent distress  CV: normal rate, regular rhythm, no murmurs, rubs or gallop  Resp: clear to auscultation bilaterally, normal effort  Assessment & Plan:  Uncontrolled type 2 diabetes mellitus ([place]) Unable to afford lantus, given loss of insurance. We will switch to 70/30 at [facility] which she should be able to get. Will start with 20 units in AM and 10 units in PM, making sure to check CBG TID. Asked her to schedule follow up in 2 weeks with CBG log.   Essential hypertension Not at goal today, reviewed flowsheets and she has been running high. Add amlodipine 5mg  and follow up 2 weeks.  Onychomycosis Discussed diagnosis, difficulty in treating, lab work for medication monitoring. Discussed it would be best to wait on trying to treat this until she gets her insurance sorted out this fall.    Return in about 2 weeks (around 08/08/2015).

## 2015-07-30 DIAGNOSIS — B351 Tinea unguium: Secondary | ICD-10-CM | POA: Insufficient documentation

## 2015-07-30 NOTE — Assessment & Plan Note (Signed)
Unable to afford lantus, given loss of insurance. We will switch to 70/30 at [facility] which she should be able to get. Will start with 20 units in AM and 10 units in PM, making sure to check CBG TID. Asked her to schedule follow up in 2 weeks with CBG log.

## 2015-07-30 NOTE — Assessment & Plan Note (Signed)
Not at goal today, reviewed flowsheets and she has been running high. Add amlodipine 5mg  and follow up 2 weeks.

## 2015-07-30 NOTE — Assessment & Plan Note (Signed)
Discussed diagnosis, difficulty in treating, lab work for medication monitoring. Discussed it would be best to wait on trying to treat this until she gets her insurance sorted out this fall.

## 2016-03-06 ENCOUNTER — Ambulatory Visit (INDEPENDENT_AMBULATORY_CARE_PROVIDER_SITE_OTHER): Payer: Self-pay | Admitting: Family Medicine

## 2016-03-06 ENCOUNTER — Encounter: Payer: Self-pay | Admitting: Family Medicine

## 2016-03-06 VITALS — BP 128/70 | HR 68 | Temp 98.1°F | Ht 66.0 in | Wt 165.0 lb

## 2016-03-06 DIAGNOSIS — R519 Headache, unspecified: Secondary | ICD-10-CM

## 2016-03-06 DIAGNOSIS — E118 Type 2 diabetes mellitus with unspecified complications: Secondary | ICD-10-CM

## 2016-03-06 DIAGNOSIS — E1165 Type 2 diabetes mellitus with hyperglycemia: Secondary | ICD-10-CM

## 2016-03-06 DIAGNOSIS — H547 Unspecified visual loss: Secondary | ICD-10-CM

## 2016-03-06 DIAGNOSIS — I10 Essential (primary) hypertension: Secondary | ICD-10-CM

## 2016-03-06 DIAGNOSIS — R51 Headache: Secondary | ICD-10-CM

## 2016-03-06 LAB — POCT GLYCOSYLATED HEMOGLOBIN (HGB A1C): HEMOGLOBIN A1C: 12.1

## 2016-03-06 NOTE — Assessment & Plan Note (Signed)
Poor control due to medication non-adherence. A1C worsened. Medication counseling done. Advised to take Novolin 20 units in AM and 20 units in PM. Continue current dose of Metformin. Check CBG TID. Parameters given and advised to call if less than 80. F/U in 2 wks with PCP.

## 2016-03-06 NOTE — Assessment & Plan Note (Signed)
Currently asymptomatic. No neurologic deficit. ?? Stress related headache vs due to poor sleep at night. May use tylenol as needed for HA. F/U with PCP for reassessment.

## 2016-03-06 NOTE — Assessment & Plan Note (Signed)
No acute finding on exam. Currently asymptomatic. Advised to contact her ophthalmologist as soon as possible for follow up. Return precaution discussed. F/U as needed.

## 2016-03-06 NOTE — Progress Notes (Signed)
Subjective:     Patient ID: Sara Davis, female   DOB: November 26, 1950, 66 y.o.   MRN: [id number]  HPI HTN/DM2:here for follow up. She is supposed to be on lantus but she stopped it because she is unable to afford it. During last visit, her Novolin 70/30 was adjusted to 20 units in the morning and 10 units in the evening. However, she has been taking 30 units at once instead. She stated her diet has been good. Her CBG has been running in the high 200s to 300s. Denies any GU symptoms. She is compliant with her Metformin and her BP medications. Headache: C/O headache which she started experiencing about 1 month ago. She stated she wakes up every morning with a very mild headache due to the way she sleeps. She changed her pillow to a soft pillow and has not had headache in more than a week. She denies any headache right now. Feels well otherwise. Spot in left eye: This has been on going for more than 6 months. She saw her eye doctor last April for this and was told her exam was okay. Whenever her sugar is high she has this symptoms. Symptoms has improved some,she is currently asymptomatic, she denies vision loss. She wears glasses at baseline.Her next ophthalmology appointment is April.   Current Outpatient Prescriptions on File Prior to Visit  Medication Sig Dispense Refill  . acetaminophen (TYLENOL) 500 MG tablet Take 1,000 mg by mouth every 6 (six) hours as needed for mild pain or fever.    Marland Kitchen amLODipine (NORVASC) 5 MG tablet Take 1 tablet (5 mg total) by mouth daily. 90 tablet 0  . Blood Glucose Monitoring Suppl (CONTOUR BLOOD GLUCOSE SYSTEM) DEVI Use to test blood sugars once in the morning before food and once in the evening.DX: 250.02 1 Device 0  . gabapentin (NEURONTIN) 300 MG capsule TAKE 1 CAPSULE(300 MG) BY MOUTH THREE TIMES DAILY 270 capsule 2  . glucose blood (ONE TOUCH TEST STRIPS) test strip Please check blood sugar once daily in AM before eating and once daily in PM 100 each 12  . insulin  glargine (LANTUS) 100 UNIT/ML injection Inject 0.4 mLs (40 Units total) into the skin daily. 10 mL 12  . insulin NPH-regular Human (NOVOLIN 70/30) (70-30) 100 UNIT/ML injection Inject 20 units in the AM, inject 10 units in PM 12 hours later 1 vial 3  . Insulin Pen Needle (B-D ULTRAFINE III SHORT PEN) 31G X 8 MM MISC CHECK BLOOD SUGARS ONCE IN MORNING BEFORE FOOD AND ONCE IN THE EVENING. 100 each 0  . Lancets (ONETOUCH ULTRASOFT) lancets Please check blood sugar once daily in AM before eating and once daily in PM 100 each 12  . lisinopril-hydrochlorothiazide (PRINZIDE,ZESTORETIC) 20-25 MG tablet Take 1 tablet by mouth daily. 90 tablet 3  . metFORMIN (GLUCOPHAGE) 1000 MG tablet Take 1 tablet (1,000 mg total) by mouth 2 (two) times daily with a meal. 60 tablet 2   No current facility-administered medications on file prior to visit.    Past Medical History:  Diagnosis Date  . Cellulitis of scalp 02/2015  . Depression   . Diabetes mellitus without complication ([place])   . Hypertension     Vitals:   03/06/16 1034  BP: 128/70  Pulse: 68  Temp: 98.1 F (36.7 C)  TempSrc: Oral  SpO2: 97%  Weight: 165 lb (74.8 kg)  Height: 5\' 6"  (1.676 m)    Review of Systems  Eyes: Negative for pain, discharge and redness.  Spots in the eye  Respiratory: Negative.   Cardiovascular: Negative.   Gastrointestinal: Negative.   Genitourinary: Negative.   Neurological: Negative for headaches.  All other systems reviewed and are negative.      Objective:   Physical Exam  Constitutional: She is oriented to person, place, and time. She appears well-developed. No distress.  Eyes: Conjunctivae and EOM are normal. Pupils are equal, round, and reactive to light. Left eye exhibits no discharge.  V/A with corrective lenses 20/20 B/L and individually with finger counting.  Cardiovascular: Normal rate, regular rhythm and normal heart sounds.   No murmur heard. Pulmonary/Chest: Effort normal and breath sounds  normal. No respiratory distress. She has no wheezes.  Abdominal: Soft. Bowel sounds are normal. She exhibits no distension and no mass. There is no tenderness.  Musculoskeletal: Normal range of motion. She exhibits no edema.  Neurological: She is alert and oriented to person, place, and time. No cranial nerve deficit. Coordination normal.  Nursing note and vitals reviewed.      Assessment:     DM2 HTN Headache Spot in the eye: Vision problem     Plan:     Check problem list.

## 2016-03-06 NOTE — Patient Instructions (Signed)
It was nice seeing you today. Your glucose is still not well controlled. Likely due to medication non-adherence.  Please take Novolin 70/30 20 units in the morning and 20 units at night. Check your glucose three times daily. Keep your glucose between 80-150. Call if less than 80. I will check on you next week to see what your numbers are running. Also see Dr. Dallas Schimke soon.

## 2016-03-06 NOTE — Assessment & Plan Note (Signed)
BP looks good. Continue current regimen. 

## 2016-03-18 NOTE — Progress Notes (Deleted)
   [facility] Phone: [phone]   Date of Visit: 03/20/2016   HPI:  DM2: - A1c: 12.1 (03/06/16) < 11.6 (07/25/15) < 13 (02/18/15) - Medication: Novolin 70/30 20 units BID, Metformin 1000 BID   ROS: See HPI.  [place]:  PMH: HTN DM2 with hx of foot ulcer Onychomycosis Insomnia Tobacco Use  PHYSICAL EXAM: There were no vitals taken for this visit. Gen: *** HEENT: *** Heart: *** Lungs: *** Neuro: *** Ext: ***  ASSESSMENT/PLAN:  Health maintenance:  -***  No problem-specific Assessment & Plan notes found for this encounter.  FOLLOW UP: Follow up in *** for ***  Smiley Houseman, MD PGY [place]

## 2016-03-20 ENCOUNTER — Ambulatory Visit: Payer: PRIVATE HEALTH INSURANCE | Admitting: Internal Medicine

## 2016-11-24 ENCOUNTER — Emergency Department (HOSPITAL_COMMUNITY): Payer: Medicare HMO

## 2016-11-24 ENCOUNTER — Inpatient Hospital Stay (HOSPITAL_COMMUNITY)
Admission: EM | Admit: 2016-11-24 | Discharge: 2016-12-01 | DRG: 305 | Disposition: A | Discharge status: Home / Self Care | Payer: Medicare HMO | Attending: Family Medicine | Admitting: Family Medicine

## 2016-11-24 ENCOUNTER — Other Ambulatory Visit: Payer: Self-pay

## 2016-11-24 ENCOUNTER — Encounter (HOSPITAL_COMMUNITY): Payer: Self-pay | Admitting: Emergency Medicine

## 2016-11-24 DIAGNOSIS — Z79899 Other long term (current) drug therapy: Secondary | ICD-10-CM

## 2016-11-24 DIAGNOSIS — R9431 Abnormal electrocardiogram [ECG] [EKG]: Secondary | ICD-10-CM | POA: Diagnosis not present

## 2016-11-24 DIAGNOSIS — N390 Urinary tract infection, site not specified: Secondary | ICD-10-CM | POA: Diagnosis present

## 2016-11-24 DIAGNOSIS — Z833 Family history of diabetes mellitus: Secondary | ICD-10-CM | POA: Diagnosis not present

## 2016-11-24 DIAGNOSIS — F17210 Nicotine dependence, cigarettes, uncomplicated: Secondary | ICD-10-CM | POA: Diagnosis present

## 2016-11-24 DIAGNOSIS — I10 Essential (primary) hypertension: Secondary | ICD-10-CM | POA: Diagnosis not present

## 2016-11-24 DIAGNOSIS — K5900 Constipation, unspecified: Secondary | ICD-10-CM | POA: Diagnosis present

## 2016-11-24 DIAGNOSIS — R0789 Other chest pain: Secondary | ICD-10-CM | POA: Diagnosis not present

## 2016-11-24 DIAGNOSIS — E1165 Type 2 diabetes mellitus with hyperglycemia: Secondary | ICD-10-CM | POA: Diagnosis present

## 2016-11-24 DIAGNOSIS — B951 Streptococcus, group B, as the cause of diseases classified elsewhere: Secondary | ICD-10-CM | POA: Diagnosis present

## 2016-11-24 DIAGNOSIS — Z23 Encounter for immunization: Secondary | ICD-10-CM

## 2016-11-24 DIAGNOSIS — R05 Cough: Secondary | ICD-10-CM | POA: Diagnosis not present

## 2016-11-24 DIAGNOSIS — E8889 Other specified metabolic disorders: Secondary | ICD-10-CM | POA: Diagnosis present

## 2016-11-24 DIAGNOSIS — Z886 Allergy status to analgesic agent status: Secondary | ICD-10-CM

## 2016-11-24 DIAGNOSIS — N183 Chronic kidney disease, stage 3 (moderate): Secondary | ICD-10-CM | POA: Diagnosis not present

## 2016-11-24 DIAGNOSIS — I160 Hypertensive urgency: Principal | ICD-10-CM | POA: Insufficient documentation

## 2016-11-24 DIAGNOSIS — Z9119 Patient's noncompliance with other medical treatment and regimen: Secondary | ICD-10-CM | POA: Diagnosis not present

## 2016-11-24 DIAGNOSIS — R011 Cardiac murmur, unspecified: Secondary | ICD-10-CM | POA: Diagnosis present

## 2016-11-24 DIAGNOSIS — Z8249 Family history of ischemic heart disease and other diseases of the circulatory system: Secondary | ICD-10-CM

## 2016-11-24 DIAGNOSIS — E43 Unspecified severe protein-calorie malnutrition: Secondary | ICD-10-CM | POA: Insufficient documentation

## 2016-11-24 DIAGNOSIS — I129 Hypertensive chronic kidney disease with stage 1 through stage 4 chronic kidney disease, or unspecified chronic kidney disease: Secondary | ICD-10-CM | POA: Diagnosis present

## 2016-11-24 DIAGNOSIS — N184 Chronic kidney disease, stage 4 (severe): Secondary | ICD-10-CM | POA: Diagnosis not present

## 2016-11-24 DIAGNOSIS — B029 Zoster without complications: Secondary | ICD-10-CM | POA: Diagnosis not present

## 2016-11-24 DIAGNOSIS — E860 Dehydration: Secondary | ICD-10-CM | POA: Diagnosis present

## 2016-11-24 DIAGNOSIS — R079 Chest pain, unspecified: Secondary | ICD-10-CM | POA: Diagnosis present

## 2016-11-24 DIAGNOSIS — E861 Hypovolemia: Secondary | ICD-10-CM | POA: Diagnosis present

## 2016-11-24 DIAGNOSIS — N179 Acute kidney failure, unspecified: Secondary | ICD-10-CM

## 2016-11-24 DIAGNOSIS — Z808 Family history of malignant neoplasm of other organs or systems: Secondary | ICD-10-CM

## 2016-11-24 DIAGNOSIS — I5030 Unspecified diastolic (congestive) heart failure: Secondary | ICD-10-CM | POA: Diagnosis not present

## 2016-11-24 DIAGNOSIS — Z90710 Acquired absence of both cervix and uterus: Secondary | ICD-10-CM

## 2016-11-24 DIAGNOSIS — Z794 Long term (current) use of insulin: Secondary | ICD-10-CM

## 2016-11-24 DIAGNOSIS — K862 Cyst of pancreas: Secondary | ICD-10-CM | POA: Diagnosis present

## 2016-11-24 DIAGNOSIS — E1122 Type 2 diabetes mellitus with diabetic chronic kidney disease: Secondary | ICD-10-CM | POA: Diagnosis present

## 2016-11-24 DIAGNOSIS — K8020 Calculus of gallbladder without cholecystitis without obstruction: Secondary | ICD-10-CM | POA: Diagnosis not present

## 2016-11-24 DIAGNOSIS — D631 Anemia in chronic kidney disease: Secondary | ICD-10-CM | POA: Diagnosis not present

## 2016-11-24 DIAGNOSIS — D638 Anemia in other chronic diseases classified elsewhere: Secondary | ICD-10-CM | POA: Diagnosis present

## 2016-11-24 LAB — BASIC METABOLIC PANEL
Anion gap: 14 (ref 5–15)
BUN: 26 mg/dL — AB (ref 6–20)
CO2: 18 mmol/L — AB (ref 22–32)
CREATININE: 1.51 mg/dL — AB (ref 0.44–1.00)
Calcium: 9.6 mg/dL (ref 8.9–10.3)
Chloride: 106 mmol/L (ref 101–111)
GFR calc Af Amer: 41 mL/min — ABNORMAL LOW (ref >60)
GFR calc non Af Amer: 35 mL/min — ABNORMAL LOW (ref >60)
GLUCOSE: 355 mg/dL — AB (ref 65–99)
Potassium: 3.7 mmol/L (ref 3.5–5.1)
Sodium: 138 mmol/L (ref 135–145)

## 2016-11-24 LAB — TROPONIN I

## 2016-11-24 LAB — LIPID PANEL
Cholesterol: 85 mg/dL (ref 0–200)
HDL: 31 mg/dL — ABNORMAL LOW (ref >40)
LDL Cholesterol: 13 mg/dL (ref 0–99)
Total CHOL/HDL Ratio: 2.7 RATIO
Triglycerides: 204 mg/dL — ABNORMAL HIGH (ref <150)
VLDL: 41 mg/dL — ABNORMAL HIGH (ref 0–40)

## 2016-11-24 LAB — IRON AND TIBC
IRON: 50 ug/dL (ref 28–170)
SATURATION RATIOS: 16 % (ref 10.4–31.8)
TIBC: 307 ug/dL (ref 250–450)
UIBC: 257 ug/dL

## 2016-11-24 LAB — CBC
HCT: 31.5 % — ABNORMAL LOW (ref 36.0–46.0)
HEMATOCRIT: 30.2 % — AB (ref 36.0–46.0)
HEMOGLOBIN: 10.3 g/dL — AB (ref 12.0–15.0)
Hemoglobin: 10 g/dL — ABNORMAL LOW (ref 12.0–15.0)
MCH: 27 pg (ref 26.0–34.0)
MCH: 27.5 pg (ref 26.0–34.0)
MCHC: 32.7 g/dL (ref 30.0–36.0)
MCHC: 33.1 g/dL (ref 30.0–36.0)
MCV: 82.7 fL (ref 78.0–100.0)
MCV: 83.2 fL (ref 78.0–100.0)
PLATELETS: 354 10*3/uL (ref 150–400)
Platelets: 345 10*3/uL (ref 150–400)
RBC: 3.81 MIL/uL — ABNORMAL LOW (ref 3.87–5.11)
RBC: 3.63 MIL/uL — AB (ref 3.87–5.11)
RDW: 13 % (ref 11.5–15.5)
RDW: 13.1 % (ref 11.5–15.5)
WBC: 9 10*3/uL (ref 4.0–10.5)
WBC: 8.7 10*3/uL (ref 4.0–10.5)

## 2016-11-24 LAB — HEMOGLOBIN A1C
Hgb A1c MFr Bld: 10.5 % — ABNORMAL HIGH (ref 4.8–5.6)
MEAN PLASMA GLUCOSE: 254.65 mg/dL

## 2016-11-24 LAB — RETICULOCYTES
RBC.: 3.65 MIL/uL — ABNORMAL LOW (ref 3.87–5.11)
Retic Count, Absolute: 58.4 10*3/uL (ref 19.0–186.0)
Retic Ct Pct: 1.6 % (ref 0.4–3.1)

## 2016-11-24 LAB — FOLATE: FOLATE: 15.2 ng/mL (ref >5.9)

## 2016-11-24 LAB — CREATININE, SERUM
Creatinine, Ser: 1.83 mg/dL — ABNORMAL HIGH (ref 0.44–1.00)
GFR calc non Af Amer: 28 mL/min — ABNORMAL LOW (ref >60)
GFR, EST AFRICAN AMERICAN: 32 mL/min — AB (ref >60)

## 2016-11-24 LAB — I-STAT TROPONIN, ED
TROPONIN I, POC: 0.03 ng/mL (ref 0.00–0.08)
Troponin i, poc: 0.01 ng/mL (ref 0.00–0.08)

## 2016-11-24 LAB — FERRITIN: Ferritin: 22 ng/mL (ref 11–307)

## 2016-11-24 LAB — VITAMIN B12: Vitamin B-12: 282 pg/mL (ref 180–914)

## 2016-11-24 LAB — GLUCOSE, CAPILLARY: Glucose-Capillary: 373 mg/dL — ABNORMAL HIGH (ref 65–99)

## 2016-11-24 MED ORDER — ENOXAPARIN SODIUM 40 MG/0.4ML ~~LOC~~ SOLN
40.0000 mg | SUBCUTANEOUS | Status: DC
  Filled 2016-11-24: qty 0.4

## 2016-11-24 MED ORDER — LISINOPRIL 10 MG PO TABS
20.0000 mg | ORAL_TABLET | Freq: Every day | ORAL | Status: DC
  Administered 2016-11-25 – 2016-11-26 (×2): 20 mg via ORAL
  Filled 2016-11-24 (×2): qty 2

## 2016-11-24 MED ORDER — POLYETHYLENE GLYCOL 3350 17 G PO PACK
17.0000 g | PACK | Freq: Every day | ORAL | Status: DC | PRN
  Administered 2016-11-27: 17 g via ORAL
  Filled 2016-11-24: qty 1

## 2016-11-24 MED ORDER — ACETAMINOPHEN 325 MG PO TABS
650.0000 mg | ORAL_TABLET | Freq: Four times a day (QID) | ORAL | Status: DC | PRN
  Administered 2016-11-27 – 2016-11-30 (×2): 650 mg via ORAL
  Filled 2016-11-24 (×2): qty 2

## 2016-11-24 MED ORDER — ATORVASTATIN CALCIUM 20 MG PO TABS
20.0000 mg | ORAL_TABLET | Freq: Every day | ORAL | Status: DC
  Administered 2016-11-25 – 2016-11-30 (×6): 20 mg via ORAL
  Filled 2016-11-24 (×6): qty 1

## 2016-11-24 MED ORDER — INSULIN GLARGINE 100 UNIT/ML ~~LOC~~ SOLN
20.0000 [IU] | Freq: Every day | SUBCUTANEOUS | Status: DC
  Administered 2016-11-24 – 2016-11-26 (×3): 20 [IU] via SUBCUTANEOUS
  Filled 2016-11-24 (×3): qty 0.2

## 2016-11-24 MED ORDER — LISINOPRIL-HYDROCHLOROTHIAZIDE 20-25 MG PO TABS: 1.0000 | ORAL_TABLET | Freq: Every day | ORAL | Status: DC

## 2016-11-24 MED ORDER — GABAPENTIN 300 MG PO CAPS: 300.0000 mg | ORAL_CAPSULE | Freq: Three times a day (TID) | ORAL | Status: DC

## 2016-11-24 MED ORDER — PNEUMOCOCCAL VAC POLYVALENT 25 MCG/0.5ML IJ INJ
0.5000 mL | INJECTION | INTRAMUSCULAR | Status: AC
  Administered 2016-11-25: 0.5 mL via INTRAMUSCULAR
  Filled 2016-11-24: qty 0.5

## 2016-11-24 MED ORDER — HYDRALAZINE HCL 20 MG/ML IJ SOLN
10.0000 mg | Freq: Once | INTRAMUSCULAR | Status: AC
  Administered 2016-11-24: 10 mg via INTRAVENOUS
  Filled 2016-11-24: qty 1

## 2016-11-24 MED ORDER — AMLODIPINE BESYLATE 5 MG PO TABS
5.0000 mg | ORAL_TABLET | Freq: Every day | ORAL | Status: DC
  Administered 2016-11-25: 5 mg via ORAL
  Filled 2016-11-24: qty 1

## 2016-11-24 MED ORDER — INFLUENZA VAC SPLIT HIGH-DOSE 0.5 ML IM SUSY
0.5000 mL | PREFILLED_SYRINGE | INTRAMUSCULAR | Status: AC
  Administered 2016-11-25: 0.5 mL via INTRAMUSCULAR
  Filled 2016-11-24: qty 0.5

## 2016-11-24 MED ORDER — NITROGLYCERIN 0.4 MG SL SUBL
0.4000 mg | SUBLINGUAL_TABLET | SUBLINGUAL | Status: AC | PRN
  Administered 2016-11-24 – 2016-11-28 (×3): 0.4 mg via SUBLINGUAL
  Filled 2016-11-24 (×4): qty 1

## 2016-11-24 MED ORDER — INSULIN ASPART 100 UNIT/ML ~~LOC~~ SOLN
0.0000 [IU] | Freq: Three times a day (TID) | SUBCUTANEOUS | Status: DC
  Administered 2016-11-25: 3 [IU] via SUBCUTANEOUS
  Administered 2016-11-25 (×2): 5 [IU] via SUBCUTANEOUS
  Administered 2016-11-26: 7 [IU] via SUBCUTANEOUS
  Administered 2016-11-26 – 2016-11-27 (×3): 5 [IU] via SUBCUTANEOUS
  Administered 2016-11-27: 7 [IU] via SUBCUTANEOUS
  Administered 2016-11-27: 5 [IU] via SUBCUTANEOUS
  Administered 2016-11-28: 7 [IU] via SUBCUTANEOUS
  Administered 2016-11-28 (×2): 3 [IU] via SUBCUTANEOUS
  Administered 2016-11-29: 2 [IU] via SUBCUTANEOUS
  Administered 2016-11-29: 3 [IU] via SUBCUTANEOUS
  Administered 2016-11-30: 2 [IU] via SUBCUTANEOUS
  Administered 2016-11-30 – 2016-12-01 (×2): 3 [IU] via SUBCUTANEOUS
  Administered 2016-12-01: 2 [IU] via SUBCUTANEOUS

## 2016-11-24 MED ORDER — SODIUM CHLORIDE 0.9 % IV SOLN
INTRAVENOUS | Status: DC
  Administered 2016-11-25: 08:00:00 via INTRAVENOUS

## 2016-11-24 MED ORDER — HYDROCHLOROTHIAZIDE 25 MG PO TABS
25.0000 mg | ORAL_TABLET | Freq: Every day | ORAL | Status: DC
  Administered 2016-11-25 – 2016-11-27 (×3): 25 mg via ORAL
  Filled 2016-11-24 (×3): qty 1

## 2016-11-24 MED ORDER — ONDANSETRON HCL 4 MG/2ML IJ SOLN: 4.0000 mg | Freq: Four times a day (QID) | INTRAMUSCULAR | Status: DC | PRN

## 2016-11-24 MED ORDER — ENSURE ENLIVE PO LIQD: 237.0000 mL | Freq: Two times a day (BID) | ORAL | Status: DC

## 2016-11-24 MED ORDER — ONDANSETRON HCL 4 MG PO TABS: 4.0000 mg | ORAL_TABLET | Freq: Four times a day (QID) | ORAL | Status: DC | PRN

## 2016-11-24 MED ORDER — ASPIRIN EC 81 MG PO TBEC
81.0000 mg | DELAYED_RELEASE_TABLET | Freq: Every day | ORAL | Status: DC
  Administered 2016-11-25 – 2016-12-01 (×7): 81 mg via ORAL
  Filled 2016-11-24 (×7): qty 1

## 2016-11-24 MED ORDER — IOPAMIDOL (ISOVUE-370) INJECTION 76%
INTRAVENOUS | Status: AC
  Administered 2016-11-24: 100 mL
  Filled 2016-11-24: qty 100

## 2016-11-24 MED ORDER — METOPROLOL TARTRATE 12.5 MG HALF TABLET
12.5000 mg | ORAL_TABLET | Freq: Two times a day (BID) | ORAL | Status: DC
  Administered 2016-11-24 – 2016-11-28 (×8): 12.5 mg via ORAL
  Filled 2016-11-24 (×8): qty 1

## 2016-11-24 MED ORDER — ACETAMINOPHEN 650 MG RE SUPP: 650.0000 mg | Freq: Four times a day (QID) | RECTAL | Status: DC | PRN

## 2016-11-24 MED ORDER — MORPHINE SULFATE (PF) 4 MG/ML IV SOLN
4.0000 mg | Freq: Once | INTRAVENOUS | Status: AC
  Administered 2016-11-24: 4 mg via INTRAVENOUS
  Filled 2016-11-24: qty 1

## 2016-11-24 MED ORDER — ALUM & MAG HYDROXIDE-SIMETH 200-200-20 MG/5ML PO SUSP: 30.0000 mL | ORAL | Status: DC | PRN

## 2016-11-24 NOTE — ED Triage Notes (Signed)
Pt reports a week of cough, congestion, and substernal chest pain.

## 2016-11-24 NOTE — Progress Notes (Signed)
Family Medicine Teaching Service Daily Progress Note Intern Pager: [phone]  Patient name: Sara Davis Medical record number: [id number] Date of birth: September 17, 1950 Age: 66 y.o. Gender: female  Primary Care Provider: Smiley Houseman, MD Consultants: cardio Code Status: full  Pt Overview and Major Events to Date:  Sara Davis is a 66 y.o. female presenting with Chest pain beginning yesterday . PMH is significant for uncontrolled T2DM, tobacco abuse, and HTN.   Assessment and Plan: Sara Davis is a 66 y.o. female presenting with Chest pain beginning yesterday . PMH is significant for uncontrolled T2DM, tobacco abuse, and HTN.   Chest pain, acute, worsening Patient reports left sided chest pain that radiates to left scapula beginning yesterday evening.  PMHx significant for uncontrolled T2DM, HTN (noncomplaint of medications x 1 month), and tobacco abuse. HEART score on admission 7. Patient reports no family hx or personal hx of MI or stroke. CTA negative for AAA or dissection. CXR showing no edema or consolidation. EKG on admission showing ST depressions in leads II, III, aVF, V5, and V6 showing possible inferior/lateral origin. On admission BP was 226/118 with some vision changes and headache reported. Troponins negative x 2. Differential include MI, hypertensive emergency (given chest pain and new AKI), PE, pneumonia, and pneumothorax however pneumonia and pneumothorax unlikely given negative CXR. PE unlikely given only slight SOB during event but resolved and O2 saturation of 98 on RA and normal CTA. Also part of our differential would be GERD but no history of reflux reported by patient. MSK pain would also be part of our differential however pain is reproducible on palpation or with movement. Lastly, pain could be secondary to endocarditis in the setting of recent viral infection. Less likely given that patient has been taking Alka-Seltzer with no improvement in her pain. Cardiology  consulted, will plan to see.  -Admit to FMTS, admitting attending Dr. Gwendlyn Deutscher -Admit to telemetry -F/u on am EKG -trend troponins (0.01,<0.03x3) -am CBC and CMP -cardiology consulted, appreciate recommendations -Vitals per floor  -Nitroglycerin prn -Tylenol 650 mg q6 -Zofran 4 mg q6 -metoprolol 12.5 BID -atorvastatin 20mg  -aspirin 81  HTN, uncontrolled 106s/90s Secondary to non-compliance. Patient reports not taking her BP medications x 1 month. BP elevated on admission. Noted to have BP with systolic in 542H and diastolic > 062 in ED. On my exam BP of 181/92 on cardiac monitor. Patient endorses symptoms of headache and vision changes (right eye blurry). Home medications of amlodipine and lisinopril-HCTZ. Patient was given hydralazine 10 mg in the ED. There was slight improvement noted after receiving nitroglycerin for chest pain on initial presentation. -continue Norvasc, lisinopril/HCTZ  -Repeat hydralazine when necessary if BP continues to remain elevated  -also started metoprolol 12.5bid -will baland HTN vs fluid needs for AKI  AKI Cr of 1.51 on admission, 1.9 on 10/24. Per chart review baseline appears to be around 1.0-1.1. Elevated creatinine likely secondary to poor intake in the past few days secondary to illness as well as an organ damage from a uncontrolled  blood pressure. This also could be a component of diabetic nephropathy in the setting of uncontrolled T2DM. -NS @ 100 cc/hr -Follow-up on a.m. BMP  T2DM, uncontrolled Last A1c on record was 12.1 on 03/06/2016. Home meds of Novolin 70/30 20 units in am, 10 units in pm, metformin 1000mg  bid, lantus 40 units daily. Patient reports not taking her medications this morning  -discontinue metformin while inpatient given AKI -Restart lantus 20 units daily -Start patient on sensitive  SSI -monitor CBGs before meals/daily at bedtime -A1c ordered  Cough, acute Patient reports cough x 1 week. No sputum production noted on exam.  CXR notes no edema or consolidation.  -continue to monitor  Anemia, chronic Hgb on admission of 10.3. Per chart review appears to be chronically anemic. Patient is not reporting symptoms of dizziness or lightheadedness. Last anemia panel on 02/24/2015 was within normal limits.. -Follow-up on anemia panel  Tobacco abuse  Patient reports 1/2 ppd smoking history. Recently decreased to 1/2 ppd and has been smoking since 66 y.o.  -nicotine patch -encourage smoking cessation   Constipation, subacute Patient reports 2 weeks constipation. States had one small soft stool this am.  -miralax daily prn   FEN/GI: heart healthy/carb modified diet  Prophylaxis: lovenox   Disposition: likely d/c to home pending cads clearance, attending Dr. Ardelia Mems  Subjective:  Patient felt well this morning, no chest pain/SOB.  No complaints at all.   Just wanted to "be safe" when she goes home.  Objective: Temp:  [97.3 F (36.3 C)-97.6 F (36.4 C)] 97.6 F (36.4 C) (10/23 2032) Pulse Rate:  [79-104] 91 (10/23 2032) Resp:  [11-24] 17 (10/23 2032) BP: (156-226)/(83-152) 168/92 (10/23 2032) SpO2:  [96 %-100 %] 99 % (10/23 2032) Weight:  [149 lb 3.2 oz (67.7 kg)] 149 lb 3.2 oz (67.7 kg) (10/23 2032) Physical Exam: General: awake and alert, sitting in bed, NAD Eyes: PERRL ENTM: moist mucous membranes  Neck: supple, no LAD Cardiovascular: RRR, no MRG  Respiratory: CTAB, no wheezes rales or rhonchi  Gastrointestinal: soft, non tender, non distended, bowel sounds normal  MSK: non tender, no edema Derm: no rashes noted, intact  Neuro: no focal deficits, sensation intact Psych: normal affect   Laboratory:  Recent Labs Lab 11/24/16 0906 11/24/16 2016  WBC 9.0 8.7  HGB 10.3* 10.0*  HCT 31.5* 30.2*  PLT 354 345    Recent Labs Lab 11/24/16 0906  NA 138  K 3.7  CL 106  CO2 18*  BUN 26*  CREATININE 1.51*  CALCIUM 9.6  GLUCOSE 355*    Troponin <0.03 x3  Imaging/Diagnostic Tests: Dg  Chest 2 View  Result Date: 11/24/2016 CLINICAL DATA:  Cough and chest pain EXAM: CHEST  2 VIEW COMPARISON:  None. FINDINGS: Lungs are clear. Heart size and pulmonary vascularity are normal. No adenopathy. There is degenerative change in the thoracic spine. There is aortic atherosclerosis. IMPRESSION: Aortic atherosclerosis.  No edema or consolidation. Aortic Atherosclerosis (ICD10-I70.0). Electronically Signed   By: Lowella Grip III M.D.   On: 11/24/2016 09:32   Ct Angio Chest/abd/pel For Dissection W And/or Wo Contrast  Result Date: 11/24/2016 CLINICAL DATA:  66 year old with four-day history of left-sided chest pain radiating into the upper back. Current history of type 2 diabetes and hypertension. Current smoker. EXAM: CT ANGIOGRAPHY CHEST, ABDOMEN AND PELVIS TECHNIQUE: Multidetector CT imaging through the chest, abdomen and pelvis was performed using the standard protocol during bolus administration of intravenous contrast. Multiplanar reconstructed images and MIPs were obtained and reviewed to evaluate the vascular anatomy. CONTRAST:  80 mL Isovue 370 IV. COMPARISON:  No prior CT.  Chest x-ray performed earlier same day. FINDINGS: CTA CHEST FINDINGS Cardiovascular: No evidence of mural hematoma on the unenhanced images. Mild calcified plaque involving the descending thoracic and visualized upper abdominal aorta. No evidence of thoracic aortic aneurysm or dissection. Proximal great vessels widely patent with minimal atherosclerosis. Heart size normal with evidence of left ventricular hypertrophy. No visible coronary atherosclerosis. No pericardial effusion.  Central pulmonary arteries widely patent. Mediastinum/Nodes: No pathologically enlarged mediastinal, hilar or axillary lymph nodes. No mediastinal masses. Normal-appearing esophagus. Thyroid gland normal in appearance. Lungs/Pleura: Mild emphysematous changes. Small bleb at the apex of the right lung. Mild right lower lobe bronchiectasis.  Pulmonary parenchyma clear without localized airspace consolidation, interstitial disease, or parenchymal nodules or masses. Central airways patent without significant bronchial wall thickening. No pleural effusions. No pleural plaques or masses. Musculoskeletal: Degenerative changes involving the visualized lower cervical spine. Spondylosis involving mid and lower thoracic spine. Review of the MIP images confirms the above findings. CTA ABDOMEN AND PELVIS FINDINGS VASCULAR Aorta: Mild atherosclerosis. No evidence of abdominal aortic dissection or aneurysm. Celiac: Widely patent. SMA: Widely patent. Renals: Single renal arteries bilaterally, widely patent. IMA: Widely patent. Inflow: Mild bilateral common iliac artery atherosclerosis without evidence of stenosis. Bilateral external iliac arteries and bilateral common femoral arteries widely patent. Veins: Not evaluated. Review of the MIP images confirms the above findings. NON-VASCULAR Hepatobiliary: Liver upper normal in size to perhaps mildly enlarged. No focal hepatic parenchymal abnormality. Approximate 1.2 cm calcified cholesterol gallstone within the otherwise normal-appearing gallbladder. No biliary ductal dilation. Pancreas: Mild pancreatic atrophy. Approximate 6 mm cystic lesion in the body of the pancreas (series 6, image 158). No other focal pancreatic parenchymal abnormality. Spleen: Normal allowing for the early arterial phase of enhancement. Adrenals/Urinary Tract: Mild bilateral adrenal enlargement without nodularity. Normal-appearing kidneys allowing for the early arterial phase of enhancement. No hydronephrosis. No visible urinary tract calculi. Urinary bladder normal in appearance. Stomach/Bowel: Stomach normal in appearance for the degree of distention. Normal-appearing small bowel. Normal appearing colon with expected colonic stool burden. Normal appearing long appendix in the right mid and lower pelvis. Lymphatic: No pathologic lymphadenopathy.  Reproductive: Surgically absent uterus.  No adnexal masses. Other: Small umbilical hernia containing fat. Musculoskeletal: Facet degenerative changes at L4-5 and L5-S1. Mild degenerative disc disease at L4-5 and L5-S1. Review of the MIP images confirms the above findings. IMPRESSION: 1. No evidence of thoracic or abdominal aortic aneurysm or dissection. Mild aortic atherosclerosis (Aortic Atherosclerosis (ICD10-170.0)). 2.  Emphysema. (ICD10-J43.9) 3.  No acute cardiopulmonary disease. 4. No acute abnormalities involving the abdomen or pelvis. 5. 6 mm cystic lesion involving the body of the pancreas. Recommend follow up pre and post contrast MRI/MRCP or pancreatic protocol CT in 2 years (and every 2 years thereafter for total of 10 years if stable). This recommendation follows ACR consensus guidelines: Management of Incidental Pancreatic Cysts: A White Paper of the ACR Incidental Findings Committee. [place] 7619;50:932-671. 6. Borderline to mild hepatomegaly. No focal hepatic parenchymal abnormality. 7. Cholelithiasis without evidence of acute cholecystitis. 8. Small umbilical hernia containing fat Electronically Signed   By: Evangeline Dakin M.D.   On: 11/24/2016 13:46     Sherene Sires, DO 11/24/2016, 9:53 PM PGY-1, [place] Intern pager: [phone], text pages welcome

## 2016-11-24 NOTE — ED Notes (Signed)
Pt c/o increasing cp. Admitting provider now at bedside

## 2016-11-24 NOTE — ED Notes (Signed)
Admitting at bedside 

## 2016-11-24 NOTE — Discharge Summary (Signed)
[facility] Discharge Summary  Patient name: Sara Davis Medical record number: [id number] Date of birth: September 05, 1950 Age: 66 y.o. Gender: female Date of Admission: 11/24/2016  Date of Discharge: 12/01/16 Admitting Physician: Kinnie Feil, MD  Primary Care Provider: Smiley Houseman, MD Consultants: nephrology, cardiology  Indication for Hospitalization: chest pain rule out  Discharge Diagnoses/Problem List:  ST changes on ecg Shingles AKI HTN T2DM Tobacco use disorder UTI  Disposition: to home  Discharge Condition: stable  Discharge Exam: General: awake and alert, sitting in bed, NAD Cardiovascular: RRR. Respiratory: CTAB, no wheezes rales or rhonchi , no IWB Gastrointestinal: soft, non tender, non distended, bowel sounds normal  MSK: non tender, no edema Derm: spots of excoriated skin with surrounding skin erythema along dermoatome mid thoracic on Left side. No pus or blisters.  Neuro: cn 2-12 intact, no focal neuro deficits Psych: normal affect   Brief Hospital Course:  Patient was admitted for chest pain rule out with concerning ST changes on ecg although troponins remained neg.   Cardiology was consulted and recommended outpatient lexiscan myoview due to ST changes with outpatient follow-up.   During stay, patient developed dermatomal distribution painful rash on left thoracic consistent with zoster and was placed on vallacyclovir as well as a UTI treated with keflex.  She also had an AKI, potentially due to CT contrast with contribution by lisinopril which was given after order was to be held.  AKI stabilized and patient was discharged to close followup with pcp and nephrology.    Issues for Follow Up:  1. Patient has no colonoscopy on record, please review history/records to determine appropriate screening 2. Incidental finding of pancreatic cyst, consider MRI in 2 years 3. Patient's DM seems uncontrolled.   Pharm student evaluated  her new humans plan and lantus still seems to be tier 3 for her.   Financial reasons may limit her acces to meds beyond the 70/30.   Please discuss with patient. 4. Cardiology outpatient follow up recs: Lexiscan Myoview (plain ECG stress testing useless due to baseline ST changes). 5. Patient should followup with Dr. Hollie Salk (nephrology), appt was not set at discharge so please confirm patient calls nephrology 6. Patient was on a 7 days course of keflex for UTI and 7 day course of valcyclovir for shingles (left thoracic), please verify compliance and resolution.  Significant Procedures:   Significant Labs and Imaging:   Recent Labs Lab 11/26/16 0243 11/27/16 0229 11/29/16 1305  WBC 13.4* 8.6 8.3  HGB 9.6* 9.0* 8.9*  HCT 28.5* 27.2* 26.6*  PLT 333 320 325    Recent Labs Lab 11/27/16 0229 11/28/16 0307 11/29/16 0355 11/29/16 1305 11/30/16 0128 12/01/16 0657  NA 134* 135 135  --  136 138  137  K 3.3* 4.1 3.5  --  3.8 4.1  4.1  CL 99* 103 100*  --  101 104  104  CO2 26 23 24   --  27 22  21*  GLUCOSE 328* 328* 215*  --  145* 176*  177*  BUN 35* 38* 27*  --  34* 37*  37*  CREATININE 2.15* 2.21* 1.76*  --  2.57* 2.48*  2.50*  CALCIUM 9.3 9.1 9.8  --  9.4 9.5  9.4  PHOS  --   --  4.2  --  4.9* 5.1*  ALKPHOS  --   --   --  69  --   --   AST  --   --   --  16  --   --   ALT  --   --   --  11*  --   --   ALBUMIN  --   --  2.6* 2.8* 2.6* 2.9*    Dg Chest 2 View  Result Date: 11/24/2016 CLINICAL DATA:  Cough and chest pain EXAM: CHEST  2 VIEW COMPARISON:  None. FINDINGS: Lungs are clear. Heart size and pulmonary vascularity are normal. No adenopathy. There is degenerative change in the thoracic spine. There is aortic atherosclerosis. IMPRESSION: Aortic atherosclerosis.  No edema or consolidation. Aortic Atherosclerosis (ICD10-I70.0). Electronically Signed   By: Lowella Grip III M.D.   On: 11/24/2016 09:32   Ct Angio Chest/abd/pel For Dissection W And/or Wo  Contrast  Result Date: 11/24/2016 CLINICAL DATA:  66 year old with four-day history of left-sided chest pain radiating into the upper back. Current history of type 2 diabetes and hypertension. Current smoker. EXAM: CT ANGIOGRAPHY CHEST, ABDOMEN AND PELVIS TECHNIQUE: Multidetector CT imaging through the chest, abdomen and pelvis was performed using the standard protocol during bolus administration of intravenous contrast. Multiplanar reconstructed images and MIPs were obtained and reviewed to evaluate the vascular anatomy. CONTRAST:  80 mL Isovue 370 IV. COMPARISON:  No prior CT.  Chest x-ray performed earlier same day. FINDINGS: CTA CHEST FINDINGS Cardiovascular: No evidence of mural hematoma on the unenhanced images. Mild calcified plaque involving the descending thoracic and visualized upper abdominal aorta. No evidence of thoracic aortic aneurysm or dissection. Proximal great vessels widely patent with minimal atherosclerosis. Heart size normal with evidence of left ventricular hypertrophy. No visible coronary atherosclerosis. No pericardial effusion. Central pulmonary arteries widely patent. Mediastinum/Nodes: No pathologically enlarged mediastinal, hilar or axillary lymph nodes. No mediastinal masses. Normal-appearing esophagus. Thyroid gland normal in appearance. Lungs/Pleura: Mild emphysematous changes. Small bleb at the apex of the right lung. Mild right lower lobe bronchiectasis. Pulmonary parenchyma clear without localized airspace consolidation, interstitial disease, or parenchymal nodules or masses. Central airways patent without significant bronchial wall thickening. No pleural effusions. No pleural plaques or masses. Musculoskeletal: Degenerative changes involving the visualized lower cervical spine. Spondylosis involving mid and lower thoracic spine. Review of the MIP images confirms the above findings. CTA ABDOMEN AND PELVIS FINDINGS VASCULAR Aorta: Mild atherosclerosis. No evidence of abdominal  aortic dissection or aneurysm. Celiac: Widely patent. SMA: Widely patent. Renals: Single renal arteries bilaterally, widely patent. IMA: Widely patent. Inflow: Mild bilateral common iliac artery atherosclerosis without evidence of stenosis. Bilateral external iliac arteries and bilateral common femoral arteries widely patent. Veins: Not evaluated. Review of the MIP images confirms the above findings. NON-VASCULAR Hepatobiliary: Liver upper normal in size to perhaps mildly enlarged. No focal hepatic parenchymal abnormality. Approximate 1.2 cm calcified cholesterol gallstone within the otherwise normal-appearing gallbladder. No biliary ductal dilation. Pancreas: Mild pancreatic atrophy. Approximate 6 mm cystic lesion in the body of the pancreas (series 6, image 158). No other focal pancreatic parenchymal abnormality. Spleen: Normal allowing for the early arterial phase of enhancement. Adrenals/Urinary Tract: Mild bilateral adrenal enlargement without nodularity. Normal-appearing kidneys allowing for the early arterial phase of enhancement. No hydronephrosis. No visible urinary tract calculi. Urinary bladder normal in appearance. Stomach/Bowel: Stomach normal in appearance for the degree of distention. Normal-appearing small bowel. Normal appearing colon with expected colonic stool burden. Normal appearing long appendix in the right mid and lower pelvis. Lymphatic: No pathologic lymphadenopathy. Reproductive: Surgically absent uterus.  No adnexal masses. Other: Small umbilical hernia containing fat. Musculoskeletal: Facet degenerative changes at L4-5 and L5-S1. Mild degenerative disc disease  at L4-5 and L5-S1. Review of the MIP images confirms the above findings. IMPRESSION: 1. No evidence of thoracic or abdominal aortic aneurysm or dissection. Mild aortic atherosclerosis (Aortic Atherosclerosis (ICD10-170.0)). 2.  Emphysema. (ICD10-J43.9) 3.  No acute cardiopulmonary disease. 4. No acute abnormalities involving the  abdomen or pelvis. 5. 6 mm cystic lesion involving the body of the pancreas. Recommend follow up pre and post contrast MRI/MRCP or pancreatic protocol CT in 2 years (and every 2 years thereafter for total of 10 years if stable). This recommendation follows ACR consensus guidelines: Management of Incidental Pancreatic Cysts: A White Paper of the ACR Incidental Findings Committee. [place] 7793;90:300-923. 6. Borderline to mild hepatomegaly. No focal hepatic parenchymal abnormality. 7. Cholelithiasis without evidence of acute cholecystitis. 8. Small umbilical hernia containing fat Electronically Signed   By: Evangeline Dakin M.D.   On: 11/24/2016 13:46    Results/Tests Pending at Time of Discharge:   Discharge Medications:  Allergies as of 12/01/2016      Reactions   Aspirin Other (See Comments)   Upset stomach      Medication List    STOP taking these medications   insulin glargine 100 UNIT/ML injection Commonly known as:  LANTUS   lisinopril-hydrochlorothiazide 20-25 MG tablet Commonly known as:  PRINZIDE,ZESTORETIC     TAKE these medications   acetaminophen 500 MG tablet Commonly known as:  TYLENOL Take 1,000 mg by mouth every 6 (six) hours as needed for mild pain or fever.   amLODipine 10 MG tablet Commonly known as:  NORVASC Take 1 tablet (10 mg total) by mouth daily. What changed:  medication strength  how much to take   aspirin 81 MG EC tablet Take 1 tablet (81 mg total) by mouth daily.   atorvastatin 20 MG tablet Commonly known as:  LIPITOR Take 1 tablet (20 mg total) by mouth daily at 6 PM.   carvedilol 12.5 MG tablet Commonly known as:  COREG Take 1 tablet (12.5 mg total) by mouth 2 (two) times daily with a meal.   cephALEXin 250 MG capsule Commonly known as:  KEFLEX Take 1 capsule (250 mg total) by mouth every 8 (eight) hours.   CONTOUR BLOOD GLUCOSE SYSTEM Devi Use to test blood sugars once in the morning before food and once in the evening.DX:  250.02   gabapentin 300 MG capsule Commonly known as:  NEURONTIN TAKE 1 CAPSULE(300 MG) BY MOUTH THREE TIMES DAILY   glucose blood test strip Commonly known as:  ONE TOUCH TEST STRIPS Please check blood sugar once daily in AM before eating and once daily in PM   insulin NPH-regular Human (70-30) 100 UNIT/ML injection Commonly known as:  NOVOLIN 70/30 Inject 20 Units into the skin 2 (two) times daily with a meal. What changed:  how much to take  how to take this  when to take this  additional instructions   Insulin Pen Needle 31G X 8 MM Misc Commonly known as:  B-D ULTRAFINE III SHORT PEN CHECK BLOOD SUGARS ONCE IN MORNING BEFORE FOOD AND ONCE IN THE EVENING.   metFORMIN 1000 MG tablet Commonly known as:  GLUCOPHAGE Take 1 tablet (1,000 mg total) by mouth 2 (two) times daily with a meal.   multivitamin with minerals Tabs tablet Take 1 tablet by mouth daily.   onetouch ultrasoft lancets Please check blood sugar once daily in AM before eating and once daily in PM   protein supplement shake Liqd Commonly known as:  PREMIER PROTEIN Take  59.1 mLs (2 oz total) by mouth 2 (two) times daily between meals.   valACYclovir 1000 MG tablet Commonly known as:  VALTREX Take 1 tablet (1,000 mg total) by mouth daily.       Discharge Instructions: Please refer to Patient Instructions section of EMR for full details.  Patient was counseled important signs and symptoms that should prompt return to medical care, changes in medications, dietary instructions, activity restrictions, and follow up appointments.   Follow-Up Appointments: Follow-up Information    Smiley Houseman, MD. Go on 12/03/2016.   Specialty:  Family Medicine Why:  9:10 am Contact information: [place] [address] [phone]           Sherene Sires, DO 12/01/2016, 8:37 PM PGY-1, [place]

## 2016-11-24 NOTE — ED Provider Notes (Signed)
[facility] Provider Note   CSN: [id number] Arrival date & time: 11/24/16  8144    History   Chief Complaint Chief Complaint  Patient presents with  . Chest Pain    HPI Sara Davis is a 66 y.o. female with history of  uncontrolled insulin-dependent DM, HTN, and tobacco abuse who presents to the ED with chest pain. Patient states she has had a cold for the past 2 weeks and start to experience intermittent chest pain 1 week ago. Pain is located on the L chest above her L breast. Is is constant in nature and acutely worsens at times. It radiates to the L shoulder and is associated with SOB, nausea, and diaphoresis. Pain worsens with exertion. Tried alka seltzer at home with no relief. Denies cardiac history. States it acutely worsens this AM which prompted her to come to the ED. She reports active chest pain when seen, though "not as bad earlier this morning." Endorses subjective fever, chills, and intermittent HA. Per chart, patient is on amlodipine 5 and lisinopril-HCTZ 20-25. However, states she has not been taking them "for a while" because she ran out them.   HPI  Past Medical History:  Diagnosis Date  . Cellulitis of scalp 02/2015  . Depression   . Diabetes mellitus without complication ([place])   . Hypertension     Patient Active Problem List   Diagnosis Date Noted  . Vision problem 03/06/2016  . Onychomycosis 07/30/2015  . Abscess 02/22/2015  . Hyperglycemia   . Essential hypertension   . Type 2 diabetes mellitus with foot ulcer ([place])   . Diabetic infection of left foot ([place])   . Headache 04/18/2014  . Pain of left great toe 01/19/2014  . Insomnia 07/31/2013  . Uncontrolled type 2 diabetes mellitus ([place]) 01/14/2012  . Tobacco abuse 01/14/2012    Past Surgical History:  Procedure Laterality Date  . ABDOMINAL HYSTERECTOMY      OB History    No data available       Home Medications    Prior to Admission medications     Medication Sig Start Date End Date Taking? Authorizing Provider  acetaminophen (TYLENOL) 500 MG tablet Take 1,000 mg by mouth every 6 (six) hours as needed for mild pain or fever.   Yes [provider]  Blood Glucose Monitoring Suppl (CONTOUR BLOOD GLUCOSE SYSTEM) DEVI Use to test blood sugars once in the morning before food and once in the evening.DX: 250.02 10/17/14  Yes Smiley Houseman, MD  glucose blood (ONE TOUCH TEST STRIPS) test strip Please check blood sugar once daily in AM before eating and once daily in PM 10/17/14  Yes Gunadasa, Almond Lint, MD  insulin NPH-regular Human (NOVOLIN 70/30) (70-30) 100 UNIT/ML injection Inject 20 units in the AM, inject 10 units in PM 12 hours later Patient taking differently: Inject 20 Units into the skin 2 (two) times daily.  07/25/15  Yes Leone Brand, MD  Insulin Pen Needle (B-D ULTRAFINE III SHORT PEN) 31G X 8 MM MISC CHECK BLOOD SUGARS ONCE IN MORNING BEFORE FOOD AND ONCE IN THE EVENING. 10/17/14  Yes Smiley Houseman, MD  Lancets [facility] ULTRASOFT) lancets Please check blood sugar once daily in AM before eating and once daily in PM 06/28/12  Yes Cletus Gash, MD  metFORMIN (GLUCOPHAGE) 1000 MG tablet Take 1 tablet (1,000 mg total) by mouth 2 (two) times daily with a meal. 07/25/15  Yes Leone Brand, MD  amLODipine (  NORVASC) 5 MG tablet Take 1 tablet (5 mg total) by mouth daily. Patient not taking: Reported on 11/24/2016 07/25/15   Leone Brand, MD  gabapentin (NEURONTIN) 300 MG capsule TAKE 1 CAPSULE(300 MG) BY MOUTH THREE TIMES DAILY Patient not taking: Reported on 11/24/2016 02/25/15   Leone Brand, MD  insulin glargine (LANTUS) 100 UNIT/ML injection Inject 0.4 mLs (40 Units total) into the skin daily. Patient not taking: Reported on 03/06/2016 02/24/15   Olam Idler, MD  lisinopril-hydrochlorothiazide (PRINZIDE,ZESTORETIC) 20-25 MG tablet Take 1 tablet by mouth daily. Patient not taking: Reported on 11/24/2016  02/18/15   Leone Brand, MD    Family History Family History  Problem Relation Age of Onset  . Diabetes Mother   . Cancer Mother   . Diabetes Sister   . Hypertension Sister   . Cancer Sister 43       Bone  . Diabetes Brother     Social History Social History  Substance Use Topics  . Smoking status: Current Every Day Smoker    Packs/day: 0.50    Years: 40.00    Types: Cigarettes  . Smokeless tobacco: Never Used     Comment: down from 1.5 ppd  . Alcohol use No     Allergies   Aspirin   Review of Systems Review of Systems  Constitutional: Positive for chills, fatigue and fever. Negative for activity change and appetite change.  Respiratory: Positive for cough. Negative for chest tightness and shortness of breath.   Cardiovascular: Positive for chest pain. Negative for palpitations and leg swelling.  Gastrointestinal: Negative for abdominal pain, anal bleeding, blood in stool, constipation, diarrhea, nausea and vomiting.  Genitourinary: Negative for difficulty urinating, dysuria, flank pain, frequency and urgency.  Neurological: Negative for dizziness.  All other systems reviewed and are negative.    Physical Exam Updated Vital Signs BP (!) 182/84   Pulse 81   Temp (!) 97.3 F (36.3 C) (Oral)   Resp 12   SpO2 97%   Physical Exam  Constitutional: She is oriented to person, place, and time. She appears well-developed and well-nourished. She appears distressed.  HENT:  Head: Normocephalic and atraumatic.  Nose: Nose normal.  Mouth/Throat: No oropharyngeal exudate.  Eyes: Pupils are equal, round, and reactive to light. Conjunctivae and EOM are normal.  Neck: Normal range of motion. Neck supple. No JVD present.  Cardiovascular: Normal rate, regular rhythm, normal heart sounds and intact distal pulses.  Exam reveals no gallop and no friction rub.   No murmur heard. Chest pain is not reproducible on palpation.   Pulmonary/Chest: Effort normal and breath sounds  normal. No respiratory distress. She has no wheezes. She has no rales. She exhibits no tenderness.  Abdominal: Soft. Bowel sounds are normal. She exhibits no distension. There is no tenderness. There is no rebound and no guarding.  Musculoskeletal: Normal range of motion. She exhibits no edema, tenderness or deformity.  Neurological: She is alert and oriented to person, place, and time.  Skin: Skin is warm and dry. Capillary refill takes less than 2 seconds. She is not diaphoretic.  Psychiatric: She has a normal mood and affect.  Nursing note and vitals reviewed.    ED Treatments / Results  Labs (all labs ordered are listed, but only abnormal results are displayed) Labs Reviewed  BASIC METABOLIC PANEL - Abnormal; Notable for the following:       Result Value   CO2 18 (*)    Glucose, Bld 355 (*)  BUN 26 (*)    Creatinine, Ser 1.51 (*)    GFR calc non Af Amer 35 (*)    GFR calc Af Amer 41 (*)    All other components within normal limits  CBC - Abnormal; Notable for the following:    RBC 3.81 (*)    Hemoglobin 10.3 (*)    HCT 31.5 (*)    All other components within normal limits  I-STAT TROPONIN, ED  I-STAT TROPONIN, ED    EKG  EKG Interpretation None       Radiology Dg Chest 2 View  Result Date: 11/24/2016 CLINICAL DATA:  Cough and chest pain EXAM: CHEST  2 VIEW COMPARISON:  None. FINDINGS: Lungs are clear. Heart size and pulmonary vascularity are normal. No adenopathy. There is degenerative change in the thoracic spine. There is aortic atherosclerosis. IMPRESSION: Aortic atherosclerosis.  No edema or consolidation. Aortic Atherosclerosis (ICD10-I70.0). Electronically Signed   By: Lowella Grip III M.D.   On: 11/24/2016 09:32   Ct Angio Chest/abd/pel For Dissection W And/or Wo Contrast  Result Date: 11/24/2016 CLINICAL DATA:  66 year old with four-day history of left-sided chest pain radiating into the upper back. Current history of type 2 diabetes and  hypertension. Current smoker. EXAM: CT ANGIOGRAPHY CHEST, ABDOMEN AND PELVIS TECHNIQUE: Multidetector CT imaging through the chest, abdomen and pelvis was performed using the standard protocol during bolus administration of intravenous contrast. Multiplanar reconstructed images and MIPs were obtained and reviewed to evaluate the vascular anatomy. CONTRAST:  80 mL Isovue 370 IV. COMPARISON:  No prior CT.  Chest x-ray performed earlier same day. FINDINGS: CTA CHEST FINDINGS Cardiovascular: No evidence of mural hematoma on the unenhanced images. Mild calcified plaque involving the descending thoracic and visualized upper abdominal aorta. No evidence of thoracic aortic aneurysm or dissection. Proximal great vessels widely patent with minimal atherosclerosis. Heart size normal with evidence of left ventricular hypertrophy. No visible coronary atherosclerosis. No pericardial effusion. Central pulmonary arteries widely patent. Mediastinum/Nodes: No pathologically enlarged mediastinal, hilar or axillary lymph nodes. No mediastinal masses. Normal-appearing esophagus. Thyroid gland normal in appearance. Lungs/Pleura: Mild emphysematous changes. Small bleb at the apex of the right lung. Mild right lower lobe bronchiectasis. Pulmonary parenchyma clear without localized airspace consolidation, interstitial disease, or parenchymal nodules or masses. Central airways patent without significant bronchial wall thickening. No pleural effusions. No pleural plaques or masses. Musculoskeletal: Degenerative changes involving the visualized lower cervical spine. Spondylosis involving mid and lower thoracic spine. Review of the MIP images confirms the above findings. CTA ABDOMEN AND PELVIS FINDINGS VASCULAR Aorta: Mild atherosclerosis. No evidence of abdominal aortic dissection or aneurysm. Celiac: Widely patent. SMA: Widely patent. Renals: Single renal arteries bilaterally, widely patent. IMA: Widely patent. Inflow: Mild bilateral common  iliac artery atherosclerosis without evidence of stenosis. Bilateral external iliac arteries and bilateral common femoral arteries widely patent. Veins: Not evaluated. Review of the MIP images confirms the above findings. NON-VASCULAR Hepatobiliary: Liver upper normal in size to perhaps mildly enlarged. No focal hepatic parenchymal abnormality. Approximate 1.2 cm calcified cholesterol gallstone within the otherwise normal-appearing gallbladder. No biliary ductal dilation. Pancreas: Mild pancreatic atrophy. Approximate 6 mm cystic lesion in the body of the pancreas (series 6, image 158). No other focal pancreatic parenchymal abnormality. Spleen: Normal allowing for the early arterial phase of enhancement. Adrenals/Urinary Tract: Mild bilateral adrenal enlargement without nodularity. Normal-appearing kidneys allowing for the early arterial phase of enhancement. No hydronephrosis. No visible urinary tract calculi. Urinary bladder normal in appearance. Stomach/Bowel: Stomach normal in appearance for  the degree of distention. Normal-appearing small bowel. Normal appearing colon with expected colonic stool burden. Normal appearing long appendix in the right mid and lower pelvis. Lymphatic: No pathologic lymphadenopathy. Reproductive: Surgically absent uterus.  No adnexal masses. Other: Small umbilical hernia containing fat. Musculoskeletal: Facet degenerative changes at L4-5 and L5-S1. Mild degenerative disc disease at L4-5 and L5-S1. Review of the MIP images confirms the above findings. IMPRESSION: 1. No evidence of thoracic or abdominal aortic aneurysm or dissection. Mild aortic atherosclerosis (Aortic Atherosclerosis (ICD10-170.0)). 2.  Emphysema. (ICD10-J43.9) 3.  No acute cardiopulmonary disease. 4. No acute abnormalities involving the abdomen or pelvis. 5. 6 mm cystic lesion involving the body of the pancreas. Recommend follow up pre and post contrast MRI/MRCP or pancreatic protocol CT in 2 years (and every 2 years  thereafter for total of 10 years if stable). This recommendation follows ACR consensus guidelines: Management of Incidental Pancreatic Cysts: A White Paper of the ACR Incidental Findings Committee. [place] 7517;[id number]. 6. Borderline to mild hepatomegaly. No focal hepatic parenchymal abnormality. 7. Cholelithiasis without evidence of acute cholecystitis. 8. Small umbilical hernia containing fat Electronically Signed   By: Evangeline Dakin M.D.   On: 11/24/2016 13:46    Procedures Procedures (including critical care time)  Medications Ordered in ED Medications  nitroGLYCERIN (NITROSTAT) SL tablet 0.4 mg (0.4 mg Sublingual Given 11/24/16 1131)  morphine 4 MG/ML injection 4 mg (4 mg Intravenous Given 11/24/16 1209)  iopamidol (ISOVUE-370) 76 % injection (100 mLs  Contrast Given 11/24/16 1301)     Initial Impression / Assessment and Plan / ED Course  I have reviewed the triage vital signs and the nursing notes.  Pertinent labs & imaging results that were available during my care of the patient were reviewed by me and considered in my medical decision making (see chart for details).    Sara Davis is a 66 y.o. female with history of uncontrolled, insulin-dependent DM (A1c 12.1 03/2016), HTN, and tobacco abuse who presents to the ED with 1-week history of chest pain that radiates to left shoulder and is associated with shortness of breath, nausea, diaphoresis. She is afebrile and hemodynamically stable, but hypertensive due to medication non-adherence. Her exam is benign, but she reports active chest pain when seen. Concern for ACS given the patient has a history of diabetes, hypertension, tobacco abuse and has ST depressions on leads II, III, aVF, V5 and V6 (no prior EKGs to review). Initial troponin negative. HEART score 7. Hypertensive emergency also possible given chest pain and new AKI with Cr 1.5 (baseline 1.1-1.2). Anemia is chronic in nature and at baseline. Low suspicion for PE,  PNA, PTX as CXR was negative. SL nitro ordered. Unassigned consult placed for chest pain rule out admission.  1130 sBP 220. Patient given SL nitro. Chest pain worsened and BP transiently improved. No changes in EKG. BP back to 200s. CTA chest/abd/pelvis to assess for aortic dissection.   1355 CTA chest/abd/pelvis negative for aortic dissection. Consult for admission placed to family medicine.   [place] to family medicine, will admit patient.   Case discussed with Dr. Thomasene Lot.   Final Clinical Impressions(s) / ED Diagnoses   Final diagnoses:  Chest pain, unspecified type    New Prescriptions New Prescriptions   No medications on file     Welford Roche, MD 11/24/16 1459    Macarthur Critchley, MD 11/28/16 [phone]

## 2016-11-24 NOTE — Progress Notes (Signed)
Pt arrived to 4E04 from ED. VSS. Denies SOB or chest pain. CHG bath completed. Tele box placed and verified x2. Pt and fiance updated on plan of care. Oriented to room and call bell. Will continue to monitor.  Jaymes Graff, RN

## 2016-11-24 NOTE — H&P (Signed)
[facility] Admission History and Physical Service Pager: [phone]  Patient name: Sara Davis Medical record number: [id number] Date of birth: 08-Nov-1950 Age: 66 y.o. Gender: female  Primary Care Provider: Smiley Houseman, MD Consultants: Cardiology  Code Status: Full   Chief Complaint: Chest pain   Assessment and Plan: Sara Davis is a 66 y.o. female presenting with Chest pain beginning yesterday . PMH is significant for uncontrolled T2DM, tobacco abuse, and HTN.   Chest pain, acute, worsening Patient reports left sided chest pain that radiates to left scapula beginning yesterday evening. Patient has PMHx significant for uncontrolled T2DM, HTN (noncomplaint of medications x 1 month), and tobacco abuse. HEART score on admission 7. Patient reports no family hx or personal hx of MI or stroke. CTA negative for AAA or dissection. CXR showing no edema or consolidation. EKG on admission showing ST depressions in leads II, III, aVF, V5, and V6 showing possible inferior/lateral origin. On admission BP was 226/118 with some vision changes and headache reported. Troponins negative x 2. Differential include MI, hypertensive emergency (given chest pain and new AKI), PE, pneumonia, and pneumothorax however pneumonia and pneumothorax unlikely given negative CXR. PE unlikely given only slight SOB during event but resolved and O2 saturation of 98 on RA and normal CTA. Also part of our differential would be GERD but no history of reflux reported by patient. MSK pain would also be part of our differential however pain is reproducible on palpation or with movement. Lastly, pain could be secondary to endocarditis in the setting of recent viral infection. Less likely given that patient has been taking Alka-Seltzer with no improvement in her pain. Cardiology consulted, will plan to see.  -Admit to FMTS, admitting attending Dr. Gwendlyn Deutscher -Admit to telemetry -F/u on am EKG -trend  troponins -am CBC and CMP -cardiology consulted, appreciate recommendations -Vitals per floor  -Nitroglycerin prn -Tylenol 650 mg q6 -Zofran 4 mg q6  HTN, uncontrolled Secondary to non-compliance. Patient reports not taking her BP medications x 1 month. BP elevated on admission. Noted to have BP with systolic in 601U and diastolic > 932 in ED. On my exam BP of 181/92 on cardiac monitor. Patient endorses symptoms of headache and vision changes (right eye blurry). Home medications of amlodipine and lisinopril-HCTZ. Patient was given hydralazine 10 mg in the ED. There was slight improvement noted after receiving nitroglycerin for chest pain on initial presentation. -continue Norvasc, lisinopril/HCTZ  -Repeat hydralazine when necessary if BP continues to remain elevated   AKI Cr of 1.51 on admission. Per chart review baseline appears to be around 1.0-1.1. Elevated creatinine likely secondary to poor intake in the past few days secondary to illness as well as an organ damage from a controlled  blood pressure. This also could be a component of diabetic nephropathy in the setting of uncontrolled T2DM. -NS @ 100 cc/hr -Follow-up on a.m. BMP  T2DM, uncontrolled Last A1c on record was 12.1 on 03/06/2016. Home meds of Novolin 70/30 20 units in am, 10 units in pm, metformin 1000mg  bid, lantus 40 units daily. Patient reports not taking her medications this morning  -discontinue metformin while inpatient given AKI -Restart lantus 20 units daily -Start patient on sensitive SSI -monitor CBGs before meals/daily at bedtime  Cough, acute Patient reports cough x 1 week. No sputum production noted on exam. CXR notes no edema or consolidation.  -continue to monitor  Anemia, chronic Hgb on admission of 10.3. Per chart review appears to be chronically anemic.  Patient is not reporting symptoms of dizziness or lightheadedness. Last anemia panel on 02/24/2015 was within normal limits.. -Follow-up on anemia  panel  Tobacco abuse  Patient reports 1/2 ppd smoking history. Recently decreased to 1/2 ppd and has been smoking since 66 y.o.  -nicotine patch -encourage smoking cessation   Constipation, subacute Patient reports 2 weeks constipation. States had one small soft stool this am.  -miralax daily prn   FEN/GI: heart healthy/carb modified diet  Prophylaxis: lovenox   Disposition: admit to telemetry, attending Dr. Gwendlyn Deutscher   History of Present Illness:  Sara Davis is a 66 y.o. female with a past history significant for poorly controlled diabetes and hypertension presenting with a few day history of radiating central chest pain. At onset, the pain started in her upper abdomen and traveled upward into her chest with radiation into her left arm. She notes shortness of breath, nausea, and excessive sweating occurred concomitantly with the pain.   Patient stated symptoms initially began with a cold last week. Cold came with associated subjective fever. Patient stated she then began to feel pain on the side of her stomach. This pain then radiated to her chest and then left shoulder blades yesterday. Patient was also SOB and diaphoretic yesterday evening.   Patient stated that she has stopped taking her blood pressure medications x 1 month. States she ran out of her medications and did not go to PCP for further refills. Home medications include amlodipine and lisinopril-HCTZ. Patient has associated symptoms of HA and vision changes (right eye gets blurry). Denies dizziness or ringing in her ears. Patient has also stopped taking her daily aspirin.   Denies any vomiting, weakness, or numbness. Endorses constipation x 2 weeks and decreased appetite. Patient also endorses a diminished urinary stream and increased frequency but denies burning, odor, or urgency.   Patient endorses 1/2 ppd tobacco use and has been smoking for 18 years.   In ED patient began ACS rule out given history of chest pain and  history of DM, HTN, and tobacco abuse. EKG on admission showed ST depressions on leads II, III, and avF, V5, and V6. Initial istat troponin negative. HEART score 7. Patient was also noted to have very elevated BP in ED, with systolics reaching up to 696. CTA was negative for aortic dissection.    Review Of Systems: Per HPI with the following additions:   Review of Systems  Constitutional: Positive for fever. Negative for chills.  HENT: Negative for tinnitus.   Eyes: Positive for blurred vision.  Respiratory: Positive for cough.   Cardiovascular: Positive for chest pain.  Gastrointestinal: Positive for constipation and nausea. Negative for diarrhea and vomiting.  Genitourinary: Positive for frequency. Negative for dysuria and urgency.  Neurological: Negative for dizziness and weakness.    Patient Active Problem List   Diagnosis Date Noted  . Chest pain   . Hypertensive urgency   . Vision problem 03/06/2016  . Onychomycosis 07/30/2015  . Abscess 02/22/2015  . Hyperglycemia   . Essential hypertension   . Type 2 diabetes mellitus with foot ulcer ([place])   . Diabetic infection of left foot ([place])   . Headache 04/18/2014  . Pain of left great toe 01/19/2014  . Insomnia 07/31/2013  . Uncontrolled type 2 diabetes mellitus ([place]) 01/14/2012  . Tobacco abuse 01/14/2012    Past Medical History: Past Medical History:  Diagnosis Date  . Cellulitis of scalp 02/2015  . Depression   . Diabetes mellitus without complication ([place])   .  Hypertension     Past Surgical History: Past Surgical History:  Procedure Laterality Date  . ABDOMINAL HYSTERECTOMY      Social History: Social History  Substance Use Topics  . Smoking status: Current Every Day Smoker    Packs/day: 0.50    Years: 40.00    Types: Cigarettes  . Smokeless tobacco: Never Used     Comment: down from 1.5 ppd  . Alcohol use No   Additional social history: 1/2 ppd smoker, denies alcohol use or illicit drug use. Lives with  fiance  Please also refer to relevant sections of EMR.  Family History: Family History  Problem Relation Age of Onset  . Diabetes Mother   . Cancer Mother   . Diabetes Sister   . Hypertension Sister   . Cancer Sister 42       Bone  . Diabetes Brother     Allergies and Medications: Allergies  Allergen Reactions  . Aspirin Other (See Comments)    Upset stomach   No current facility-administered medications on file prior to encounter.    Current Outpatient Prescriptions on File Prior to Encounter  Medication Sig Dispense Refill  . acetaminophen (TYLENOL) 500 MG tablet Take 1,000 mg by mouth every 6 (six) hours as needed for mild pain or fever.    . Blood Glucose Monitoring Suppl (CONTOUR BLOOD GLUCOSE SYSTEM) DEVI Use to test blood sugars once in the morning before food and once in the evening.DX: 250.02 1 Device 0  . glucose blood (ONE TOUCH TEST STRIPS) test strip Please check blood sugar once daily in AM before eating and once daily in PM 100 each 12  . insulin NPH-regular Human (NOVOLIN 70/30) (70-30) 100 UNIT/ML injection Inject 20 units in the AM, inject 10 units in PM 12 hours later (Patient taking differently: Inject 20 Units into the skin 2 (two) times daily. ) 1 vial 3  . Insulin Pen Needle (B-D ULTRAFINE III SHORT PEN) 31G X 8 MM MISC CHECK BLOOD SUGARS ONCE IN MORNING BEFORE FOOD AND ONCE IN THE EVENING. 100 each 0  . Lancets (ONETOUCH ULTRASOFT) lancets Please check blood sugar once daily in AM before eating and once daily in PM 100 each 12  . metFORMIN (GLUCOPHAGE) 1000 MG tablet Take 1 tablet (1,000 mg total) by mouth 2 (two) times daily with a meal. 60 tablet 2  . amLODipine (NORVASC) 5 MG tablet Take 1 tablet (5 mg total) by mouth daily. (Patient not taking: Reported on 11/24/2016) 90 tablet 0  . gabapentin (NEURONTIN) 300 MG capsule TAKE 1 CAPSULE(300 MG) BY MOUTH THREE TIMES DAILY (Patient not taking: Reported on 11/24/2016) 270 capsule 2  . insulin glargine (LANTUS)  100 UNIT/ML injection Inject 0.4 mLs (40 Units total) into the skin daily. (Patient not taking: Reported on 03/06/2016) 10 mL 12  . lisinopril-hydrochlorothiazide (PRINZIDE,ZESTORETIC) 20-25 MG tablet Take 1 tablet by mouth daily. (Patient not taking: Reported on 11/24/2016) 90 tablet 3    Objective: BP (!) 177/98   Pulse 79   Temp (!) 97.3 F (36.3 C) (Oral)   Resp 13   SpO2 99%  Exam: General: awake and alert, sitting in bed eating, NAD Eyes: PERRL ENTM: moist mucous membranes  Neck: supple, no LAD Cardiovascular: RRR, no MRG, 2+ pulses in radial, PT, and pedal pulses bilaterally  Respiratory: CTAB, no wheezes rales or rhonchi  Gastrointestinal: soft, non tender, non distended, bowel sounds normal  MSK: non tender, no edema Derm: no rashes noted, intact  Neuro: no  focal deficits, sensation intact, 5/5 muscle strength bilaterally, normal grip strength  Psych: normal affect   Labs and Imaging: CBC BMET   Recent Labs Lab 11/24/16 0906  WBC 9.0  HGB 10.3*  HCT 31.5*  PLT 354    Recent Labs Lab 11/24/16 0906  NA 138  K 3.7  CL 106  CO2 18*  BUN 26*  CREATININE 1.51*  GLUCOSE 355*  CALCIUM 9.6       Ref. Range 11/24/2016 09:17 11/24/2016 09:28 11/24/2016 09:37 11/24/2016 13:34 11/24/2016 14:47  Troponin i, poc Latest Ref Range: 0.00 - 0.08 ng/mL 0.03    0.01    Dg Chest 2 View  Result Date: 11/24/2016 CLINICAL DATA:  Cough and chest pain EXAM: CHEST  2 VIEW COMPARISON:  None. FINDINGS: Lungs are clear. Heart size and pulmonary vascularity are normal. No adenopathy. There is degenerative change in the thoracic spine. There is aortic atherosclerosis. IMPRESSION: Aortic atherosclerosis.  No edema or consolidation. Aortic Atherosclerosis (ICD10-I70.0). Electronically Signed   By: Lowella Grip III M.D.   On: 11/24/2016 09:32   Ct Angio Chest/abd/pel For Dissection W And/or Wo Contrast  Result Date: 11/24/2016 CLINICAL DATA:  66 year old with four-day history of  left-sided chest pain radiating into the upper back. Current history of type 2 diabetes and hypertension. Current smoker. EXAM: CT ANGIOGRAPHY CHEST, ABDOMEN AND PELVIS TECHNIQUE: Multidetector CT imaging through the chest, abdomen and pelvis was performed using the standard protocol during bolus administration of intravenous contrast. Multiplanar reconstructed images and MIPs were obtained and reviewed to evaluate the vascular anatomy. CONTRAST:  80 mL Isovue 370 IV. COMPARISON:  No prior CT.  Chest x-ray performed earlier same day. FINDINGS: CTA CHEST FINDINGS Cardiovascular: No evidence of mural hematoma on the unenhanced images. Mild calcified plaque involving the descending thoracic and visualized upper abdominal aorta. No evidence of thoracic aortic aneurysm or dissection. Proximal great vessels widely patent with minimal atherosclerosis. Heart size normal with evidence of left ventricular hypertrophy. No visible coronary atherosclerosis. No pericardial effusion. Central pulmonary arteries widely patent. Mediastinum/Nodes: No pathologically enlarged mediastinal, hilar or axillary lymph nodes. No mediastinal masses. Normal-appearing esophagus. Thyroid gland normal in appearance. Lungs/Pleura: Mild emphysematous changes. Small bleb at the apex of the right lung. Mild right lower lobe bronchiectasis. Pulmonary parenchyma clear without localized airspace consolidation, interstitial disease, or parenchymal nodules or masses. Central airways patent without significant bronchial wall thickening. No pleural effusions. No pleural plaques or masses. Musculoskeletal: Degenerative changes involving the visualized lower cervical spine. Spondylosis involving mid and lower thoracic spine. Review of the MIP images confirms the above findings. CTA ABDOMEN AND PELVIS FINDINGS VASCULAR Aorta: Mild atherosclerosis. No evidence of abdominal aortic dissection or aneurysm. Celiac: Widely patent. SMA: Widely patent. Renals: Single  renal arteries bilaterally, widely patent. IMA: Widely patent. Inflow: Mild bilateral common iliac artery atherosclerosis without evidence of stenosis. Bilateral external iliac arteries and bilateral common femoral arteries widely patent. Veins: Not evaluated. Review of the MIP images confirms the above findings. NON-VASCULAR Hepatobiliary: Liver upper normal in size to perhaps mildly enlarged. No focal hepatic parenchymal abnormality. Approximate 1.2 cm calcified cholesterol gallstone within the otherwise normal-appearing gallbladder. No biliary ductal dilation. Pancreas: Mild pancreatic atrophy. Approximate 6 mm cystic lesion in the body of the pancreas (series 6, image 158). No other focal pancreatic parenchymal abnormality. Spleen: Normal allowing for the early arterial phase of enhancement. Adrenals/Urinary Tract: Mild bilateral adrenal enlargement without nodularity. Normal-appearing kidneys allowing for the early arterial phase of enhancement. No hydronephrosis. No visible  urinary tract calculi. Urinary bladder normal in appearance. Stomach/Bowel: Stomach normal in appearance for the degree of distention. Normal-appearing small bowel. Normal appearing colon with expected colonic stool burden. Normal appearing long appendix in the right mid and lower pelvis. Lymphatic: No pathologic lymphadenopathy. Reproductive: Surgically absent uterus.  No adnexal masses. Other: Small umbilical hernia containing fat. Musculoskeletal: Facet degenerative changes at L4-5 and L5-S1. Mild degenerative disc disease at L4-5 and L5-S1. Review of the MIP images confirms the above findings. IMPRESSION: 1. No evidence of thoracic or abdominal aortic aneurysm or dissection. Mild aortic atherosclerosis (Aortic Atherosclerosis (ICD10-170.0)). 2.  Emphysema. (ICD10-J43.9) 3.  No acute cardiopulmonary disease. 4. No acute abnormalities involving the abdomen or pelvis. 5. 6 mm cystic lesion involving the body of the pancreas. Recommend  follow up pre and post contrast MRI/MRCP or pancreatic protocol CT in 2 years (and every 2 years thereafter for total of 10 years if stable). This recommendation follows ACR consensus guidelines: Management of Incidental Pancreatic Cysts: A White Paper of the ACR Incidental Findings Committee. [place] 6151;83:[id number]. 6. Borderline to mild hepatomegaly. No focal hepatic parenchymal abnormality. 7. Cholelithiasis without evidence of acute cholecystitis. 8. Small umbilical hernia containing fat Electronically Signed   By: Evangeline Dakin M.D.   On: 11/24/2016 13:46   I have seen and evaluated the patient with Dr. Tammi Klippel. I am in agreement with the note above in its revised form. My additions are in blue.  Marjie Skiff, MD Family Medicine, PGY-2  Caroline More, DO 11/24/2016, 5:22 PM PGY-1, [facility] pager: [phone], text pages welcome

## 2016-11-24 NOTE — Progress Notes (Addendum)
Patient seen today around 4:15pm and was discussed with the resident. I will cosign H&P once completed.

## 2016-11-24 NOTE — ED Notes (Signed)
EDP notified of elevated BP 

## 2016-11-25 ENCOUNTER — Observation Stay (HOSPITAL_BASED_OUTPATIENT_CLINIC_OR_DEPARTMENT_OTHER): Payer: Medicare HMO

## 2016-11-25 ENCOUNTER — Encounter (HOSPITAL_COMMUNITY): Payer: Self-pay | Admitting: *Deleted

## 2016-11-25 DIAGNOSIS — K5900 Constipation, unspecified: Secondary | ICD-10-CM | POA: Diagnosis present

## 2016-11-25 DIAGNOSIS — R079 Chest pain, unspecified: Secondary | ICD-10-CM | POA: Diagnosis present

## 2016-11-25 DIAGNOSIS — Z833 Family history of diabetes mellitus: Secondary | ICD-10-CM | POA: Diagnosis not present

## 2016-11-25 DIAGNOSIS — E1122 Type 2 diabetes mellitus with diabetic chronic kidney disease: Secondary | ICD-10-CM | POA: Diagnosis present

## 2016-11-25 DIAGNOSIS — R011 Cardiac murmur, unspecified: Secondary | ICD-10-CM | POA: Diagnosis present

## 2016-11-25 DIAGNOSIS — R9431 Abnormal electrocardiogram [ECG] [EKG]: Secondary | ICD-10-CM | POA: Diagnosis not present

## 2016-11-25 DIAGNOSIS — I10 Essential (primary) hypertension: Secondary | ICD-10-CM

## 2016-11-25 DIAGNOSIS — E43 Unspecified severe protein-calorie malnutrition: Secondary | ICD-10-CM | POA: Insufficient documentation

## 2016-11-25 DIAGNOSIS — I160 Hypertensive urgency: Secondary | ICD-10-CM | POA: Diagnosis present

## 2016-11-25 DIAGNOSIS — K862 Cyst of pancreas: Secondary | ICD-10-CM | POA: Diagnosis present

## 2016-11-25 DIAGNOSIS — I5030 Unspecified diastolic (congestive) heart failure: Secondary | ICD-10-CM | POA: Diagnosis not present

## 2016-11-25 DIAGNOSIS — Z79899 Other long term (current) drug therapy: Secondary | ICD-10-CM | POA: Diagnosis not present

## 2016-11-25 DIAGNOSIS — B029 Zoster without complications: Secondary | ICD-10-CM | POA: Diagnosis not present

## 2016-11-25 DIAGNOSIS — Z794 Long term (current) use of insulin: Secondary | ICD-10-CM | POA: Diagnosis not present

## 2016-11-25 DIAGNOSIS — N184 Chronic kidney disease, stage 4 (severe): Secondary | ICD-10-CM | POA: Diagnosis present

## 2016-11-25 DIAGNOSIS — E1165 Type 2 diabetes mellitus with hyperglycemia: Secondary | ICD-10-CM | POA: Diagnosis present

## 2016-11-25 DIAGNOSIS — E860 Dehydration: Secondary | ICD-10-CM | POA: Diagnosis present

## 2016-11-25 DIAGNOSIS — N390 Urinary tract infection, site not specified: Secondary | ICD-10-CM | POA: Diagnosis present

## 2016-11-25 DIAGNOSIS — Z808 Family history of malignant neoplasm of other organs or systems: Secondary | ICD-10-CM | POA: Diagnosis not present

## 2016-11-25 DIAGNOSIS — Z23 Encounter for immunization: Secondary | ICD-10-CM | POA: Diagnosis present

## 2016-11-25 DIAGNOSIS — N179 Acute kidney failure, unspecified: Secondary | ICD-10-CM | POA: Diagnosis present

## 2016-11-25 DIAGNOSIS — I129 Hypertensive chronic kidney disease with stage 1 through stage 4 chronic kidney disease, or unspecified chronic kidney disease: Secondary | ICD-10-CM | POA: Diagnosis present

## 2016-11-25 DIAGNOSIS — D638 Anemia in other chronic diseases classified elsewhere: Secondary | ICD-10-CM | POA: Diagnosis present

## 2016-11-25 DIAGNOSIS — R0789 Other chest pain: Secondary | ICD-10-CM | POA: Diagnosis not present

## 2016-11-25 DIAGNOSIS — Z9119 Patient's noncompliance with other medical treatment and regimen: Secondary | ICD-10-CM | POA: Diagnosis not present

## 2016-11-25 DIAGNOSIS — B951 Streptococcus, group B, as the cause of diseases classified elsewhere: Secondary | ICD-10-CM | POA: Diagnosis present

## 2016-11-25 DIAGNOSIS — F17210 Nicotine dependence, cigarettes, uncomplicated: Secondary | ICD-10-CM | POA: Diagnosis present

## 2016-11-25 DIAGNOSIS — Z886 Allergy status to analgesic agent status: Secondary | ICD-10-CM | POA: Diagnosis not present

## 2016-11-25 DIAGNOSIS — Z8249 Family history of ischemic heart disease and other diseases of the circulatory system: Secondary | ICD-10-CM | POA: Diagnosis not present

## 2016-11-25 DIAGNOSIS — E8889 Other specified metabolic disorders: Secondary | ICD-10-CM | POA: Diagnosis present

## 2016-11-25 LAB — CBC
HEMATOCRIT: 29.3 % — AB (ref 36.0–46.0)
Hemoglobin: 9.5 g/dL — ABNORMAL LOW (ref 12.0–15.0)
MCH: 27.1 pg (ref 26.0–34.0)
MCHC: 32.4 g/dL (ref 30.0–36.0)
MCV: 83.5 fL (ref 78.0–100.0)
PLATELETS: 326 10*3/uL (ref 150–400)
RBC: 3.51 MIL/uL — ABNORMAL LOW (ref 3.87–5.11)
RDW: 13.2 % (ref 11.5–15.5)
WBC: 9.1 10*3/uL (ref 4.0–10.5)

## 2016-11-25 LAB — GLUCOSE, CAPILLARY
GLUCOSE-CAPILLARY: 212 mg/dL — AB (ref 65–99)
Glucose-Capillary: 271 mg/dL — ABNORMAL HIGH (ref 65–99)
Glucose-Capillary: 270 mg/dL — ABNORMAL HIGH (ref 65–99)
Glucose-Capillary: 225 mg/dL — ABNORMAL HIGH (ref 65–99)

## 2016-11-25 LAB — TROPONIN I: Troponin I: <0.03 ng/mL (ref <0.03)

## 2016-11-25 LAB — BASIC METABOLIC PANEL
Anion gap: 10 (ref 5–15)
BUN: 31 mg/dL — ABNORMAL HIGH (ref 6–20)
CALCIUM: 9.4 mg/dL (ref 8.9–10.3)
CO2: 25 mmol/L (ref 22–32)
Chloride: 103 mmol/L (ref 101–111)
Creatinine, Ser: 1.9 mg/dL — ABNORMAL HIGH (ref 0.44–1.00)
GFR, EST AFRICAN AMERICAN: 31 mL/min — AB (ref >60)
GFR, EST NON AFRICAN AMERICAN: 27 mL/min — AB (ref >60)
GLUCOSE: 291 mg/dL — AB (ref 65–99)
Potassium: 3.5 mmol/L (ref 3.5–5.1)
Sodium: 138 mmol/L (ref 135–145)

## 2016-11-25 LAB — ECHOCARDIOGRAM COMPLETE
Height: 66 in
WEIGHTICAEL: 2387.2 [oz_av]

## 2016-11-25 LAB — MRSA PCR SCREENING: MRSA BY PCR: NEGATIVE

## 2016-11-25 LAB — HIV ANTIBODY (ROUTINE TESTING W REFLEX): HIV SCREEN 4TH GENERATION: NONREACTIVE

## 2016-11-25 LAB — TSH: TSH: 2.083 u[IU]/mL (ref 0.350–4.500)

## 2016-11-25 MED ORDER — PREMIER PROTEIN SHAKE
2.0000 [oz_av] | Freq: Two times a day (BID) | ORAL | Status: DC
  Administered 2016-11-25 – 2016-12-01 (×2): 2 [oz_av] via ORAL
  Filled 2016-11-25 (×18): qty 325.31

## 2016-11-25 MED ORDER — ENOXAPARIN SODIUM 30 MG/0.3ML ~~LOC~~ SOLN
30.0000 mg | SUBCUTANEOUS | Status: DC
  Administered 2016-11-25 – 2016-11-30 (×6): 30 mg via SUBCUTANEOUS
  Filled 2016-11-25 (×6): qty 0.3

## 2016-11-25 MED ORDER — ADULT MULTIVITAMIN W/MINERALS CH
1.0000 | ORAL_TABLET | Freq: Every day | ORAL | Status: DC
  Administered 2016-11-25 – 2016-12-01 (×7): 1 via ORAL
  Filled 2016-11-25 (×7): qty 1

## 2016-11-25 MED ORDER — AMLODIPINE BESYLATE 10 MG PO TABS
10.0000 mg | ORAL_TABLET | Freq: Every day | ORAL | Status: DC
  Administered 2016-11-26 – 2016-12-01 (×6): 10 mg via ORAL
  Filled 2016-11-25 (×6): qty 1

## 2016-11-25 MED ORDER — SODIUM CHLORIDE 0.9 % IV SOLN: INTRAVENOUS | Status: DC

## 2016-11-25 NOTE — Care Management Obs Status (Signed)
[place] NOTIFICATION   Patient Details  Name: KRYSTIE LEITER MRN: [id number] Date of Birth: 1950/08/20   Medicare Observation Status Notification Given:  Yes    Carles Collet, RN 11/25/2016, 2:32 PM

## 2016-11-25 NOTE — Progress Notes (Signed)
Initial Nutrition Assessment  DOCUMENTATION CODES:   Severe malnutrition in context of acute illness/injury  INTERVENTION:   Premier Protein BID, each supplement provides 160 kcal and 30 grams of protein.   MVI  Liberalize fat restriction from diet   NUTRITION DIAGNOSIS:   Malnutrition (severe) related to acute illness as evidenced by 10 percent weight loss in 2 months, moderate depletions of body fat in arms and chest, moderate depletions of muscle mass in clavicles and hands, severe depletions of muscle mass in BLE.  GOAL:   Patient will meet greater than or equal to 90% of their needs  MONITOR:   PO intake, Supplement acceptance, Labs, Weight trends  REASON FOR ASSESSMENT:   Malnutrition Screening Tool    ASSESSMENT:   66 y.o. female with history of uncontrolled, insulin-dependent DM (A1c 12.1 03/2016), HTN, and tobacco abuse who presents to the ED with 1-week history of chest pain that radiates to left shoulder and is associated with shortness of breath, nausea, diaphoresis.   Met with pt in room today. Pt reports poor appetite and oral intake for 2-3 weeks pta. Pt reports that she does not have a desire to eat. Pt reports that she weighed 165lbs about 2 months ago; pt has lost 16lbs(10%) in two months; this is severe. Pt is currently eating <25% of meals. Pt ate some peaches, two bites of grits, and two bites of pancake for breakfast this morning. Pt was offered Ensure but she did not drink it as she reports she does not like Ensure. RD discussed with pt the importance of adequate protein intake to preserve lean muscle. Pt is willing to try Premier Protein; RD will order. Pt also educated regarding a diabetic friendly diet today.    Medications reviewed and include: aspirin, lovenox, hydrochlorothiazide, insulin, NaCl '@100ml'$ /hr  Labs reviewed: BUN 31(H), creat 1.9(H) B12- 282- 10/23 Hgb 9.5(L), Hct 29.3(L) cbgs- 355, 291 x 24 hrs AIC 10.5(H)- 10/23  Nutrition-Focused  physical exam completed. Findings are moderate fat depletions in arms and chest, moderate muscle depletions in clavicles and hands, severe muscle depletions in BLE, and no edema.   Diet Order:  Diet heart healthy/carb modified Room service appropriate? Yes; Fluid consistency: Thin  Skin:  Reviewed, no issues  Last BM:  PTA  Height:   Ht Readings from Last 1 Encounters:  11/24/16 '5\' 6"'$  (1.676 m)    Weight:   Wt Readings from Last 1 Encounters:  11/24/16 149 lb 3.2 oz (67.7 kg)    Ideal Body Weight:  59 kg  BMI:  Body mass index is 24.08 kg/m.  Estimated Nutritional Needs:   Kcal:  1600-1800kcal/day   Protein:  75-88g/day  Fluid:  >1.6L/day   EDUCATION NEEDS:   Education needs addressed  Koleen Distance MS, RD, LDN Pager #[phone] After Hours Pager: [phone]

## 2016-11-25 NOTE — Consult Note (Signed)
Cardiology Consultation:   Patient ID: Sara Davis; [id number]; [birth year]   Admit date: 11/24/2016 Date of Consult: 11/25/2016  Primary Care Provider: Smiley Houseman, MD Primary Cardiologist: New- Dr Linley Moxley   Patient Profile:   Sara Davis is a 66 y.o. female with a hx of poorly controlled DM type 2, tobacco use and HTN who is being seen today for the evaluation of chest pain at the request of Dr. Criss Rosales.  History of Present Illness:   Ms. Isakson presented to the ED yesterday with complaints of left sided chest pain that radiated to the left scapula that began the previous evening.   Her BP was elevated at 226/118 with some vision changes, headache and labs indicating acute renal injury. CXR was negative except for aortic calcifications. CTA was negative for PE or dissection, showed proximal great vessels widely patent with minimal atherosclerosis, no evidence of LVH or pericardial effusion. Troponins have been negative X 5. She has some EKG changes- initially ST depression, today TWI. She has no previous history of cardiac illness or family history of heart attack or stroke.  Upon assessment the patient tells me that she has had a cold with coughing and difficulty lying flat at night for the last 2 weeks. On Friday she developed some left sided abdominal pain that she says moved up to her left chest and left back around the scapula by Monday. On Monday night the left sided chest pain was worse and she presented to the ED on Tuesday morning. The pain is intermittent and associated with shortness of breath. She tries to move her arm around to relieve the pain. The pain is not worse with movement, deep breathing or cough. She has point tenderness at the area beneath her left scapula like her pain, but no tenderness of the left anterior chest to represent that pain. Prior to this episode she was fairly active working around the house with no exertional chest discomfort, DOE,  palpitations, lightheadedness, syncope, othopnea, PND or edema.   She smokes 1/2 PPD since age 82. She denies alcohol use. She has been out of her BP meds for at least a month due to const and no medical coverage, but now has medicare as she has just turned 66. She states that she has been taking her insulin.   Of note lipid panel indicates LDL of 13, has been 0 in the past.    Past Medical History:  Diagnosis Date  . Cellulitis of scalp 02/2015  . Depression   . Diabetes mellitus without complication ([place])   . Hypertension     Past Surgical History:  Procedure Laterality Date  . ABDOMINAL HYSTERECTOMY       Home Medications:  Prior to Admission medications   Medication Sig Start Date End Date Taking? Authorizing Provider  acetaminophen (TYLENOL) 500 MG tablet Take 1,000 mg by mouth every 6 (six) hours as needed for mild pain or fever.   Yes [provider]  Blood Glucose Monitoring Suppl (CONTOUR BLOOD GLUCOSE SYSTEM) DEVI Use to test blood sugars once in the morning before food and once in the evening.DX: 250.02 10/17/14  Yes Smiley Houseman, MD  glucose blood (ONE TOUCH TEST STRIPS) test strip Please check blood sugar once daily in AM before eating and once daily in PM 10/17/14  Yes Smiley Houseman, MD  insulin NPH-regular Human (NOVOLIN 70/30) (70-30) 100 UNIT/ML injection Inject 20 units in the AM, inject 10 units in PM 12 hours later  Patient taking differently: Inject 20 Units into the skin 2 (two) times daily.  07/25/15  Yes Leone Brand, MD  Insulin Pen Needle (B-D ULTRAFINE III SHORT PEN) 31G X 8 MM MISC CHECK BLOOD SUGARS ONCE IN MORNING BEFORE FOOD AND ONCE IN THE EVENING. 10/17/14  Yes Smiley Houseman, MD  Lancets [facility] ULTRASOFT) lancets Please check blood sugar once daily in AM before eating and once daily in PM 06/28/12  Yes Cletus Gash, MD  metFORMIN (GLUCOPHAGE) 1000 MG tablet Take 1 tablet (1,000 mg total) by mouth 2 (two) times daily  with a meal. 07/25/15  Yes Leone Brand, MD  amLODipine (NORVASC) 5 MG tablet Take 1 tablet (5 mg total) by mouth daily. Patient not taking: Reported on 11/24/2016 07/25/15   Leone Brand, MD  gabapentin (NEURONTIN) 300 MG capsule TAKE 1 CAPSULE(300 MG) BY MOUTH THREE TIMES DAILY Patient not taking: Reported on 11/24/2016 02/25/15   Leone Brand, MD  insulin glargine (LANTUS) 100 UNIT/ML injection Inject 0.4 mLs (40 Units total) into the skin daily. Patient not taking: Reported on 03/06/2016 02/24/15   Olam Idler, MD  lisinopril-hydrochlorothiazide (PRINZIDE,ZESTORETIC) 20-25 MG tablet Take 1 tablet by mouth daily. Patient not taking: Reported on 11/24/2016 02/18/15   Leone Brand, MD    Inpatient Medications: Scheduled Meds: . amLODipine  5 mg Oral Daily  . aspirin EC  81 mg Oral Daily  . atorvastatin  20 mg Oral q1800  . enoxaparin (LOVENOX) injection  40 mg Subcutaneous Q24H  . hydrochlorothiazide  25 mg Oral Daily  . insulin aspart  0-9 Units Subcutaneous TID WC  . insulin glargine  20 Units Subcutaneous Daily  . lisinopril  20 mg Oral Daily  . metoprolol tartrate  12.5 mg Oral BID  . multivitamin with minerals  1 tablet Oral Daily  . protein supplement shake  2 oz Oral BID BM   Continuous Infusions: . sodium chloride     PRN Meds: acetaminophen **OR** acetaminophen, alum & mag hydroxide-simeth, nitroGLYCERIN, ondansetron **OR** ondansetron (ZOFRAN) IV, polyethylene glycol  Allergies:    Allergies  Allergen Reactions  . Aspirin Other (See Comments)    Upset stomach    Social History:   Social History   Social History  . Marital status: Single    Spouse name: N/A  . Number of children: N/A  . Years of education: N/A   Occupational History  . Not on file.   Social History Main Topics  . Smoking status: Current Every Day Smoker    Packs/day: 0.50    Years: 40.00    Types: Cigarettes  . Smokeless tobacco: Never Used     Comment: down from 1.5 ppd  .  Alcohol use No  . Drug use: No  . Sexual activity: Not on file   Other Topics Concern  . Not on file   Social History Narrative  . No narrative on file    Family History:    Family History  Problem Relation Age of Onset  . Diabetes Mother   . Cancer Mother   . Diabetes Sister   . Hypertension Sister   . Cancer Sister 58       Bone  . Diabetes Brother   . Diabetes Sister   . Diabetes Sister      ROS:  Please see the history of present illness.  ROS  All other ROS reviewed and negative.     Physical Exam/Data:   Vitals:   11/24/16  1900 11/24/16 2032 11/25/16 0338 11/25/16 0805  BP: (!) 179/92 (!) 168/92 (!) 157/86 (!) 166/93  Pulse:  91 71   Resp: 17 17 10    Temp:  97.6 F (36.4 C) 98.1 F (36.7 C)   TempSrc:  Oral Oral   SpO2:  99% 98%   Weight:  149 lb 3.2 oz (67.7 kg)    Height:  5\' 6"  (1.676 m)      Intake/Output Summary (Last 24 hours) at 11/25/16 1122 Last data filed at 11/25/16 0953  Gross per 24 hour  Intake              480 ml  Output                0 ml  Net              480 ml   Filed Weights   11/24/16 2032  Weight: 149 lb 3.2 oz (67.7 kg)   Body mass index is 24.08 kg/m.  General:  Thin, well developed, in no acute distress HEENT: normal Lymph: no adenopathy Neck: no JVD Endocrine:  No thryomegaly Vascular: No carotid bruits; FA pulses 2+ bilaterally without bruits  Cardiac:  normal S1, S2; RRR; no murmur  Lungs:  clear to auscultation bilaterally, no wheezing, rhonchi or rales, occ cough during exam Abd: soft, nontender, no hepatomegaly  Ext: no edema Musculoskeletal:  No deformities, BUE and BLE strength normal and equal Skin: warm and dry  Neuro:  CNs 2-12 intact, no focal abnormalities noted Psych:  Normal affect   EKG:  The EKG was personally reviewed and demonstrates:  initial EKG: NSR at 93 bpm with Biatrial enlargement and inferior ST depression.  Today EKG: nsr at 71 bpm with TWI inferior and laterally  Telemetry:   Telemetry was personally reviewed and demonstrates:  NSR in the 60's  Relevant CV Studies:  Echo pending  Laboratory Data:  Chemistry  Recent Labs Lab 11/24/16 0906 11/24/16 2016 11/25/16 0325  NA 138  --  138  K 3.7  --  3.5  CL 106  --  103  CO2 18*  --  25  GLUCOSE 355*  --  291*  BUN 26*  --  31*  CREATININE 1.51* 1.83* 1.90*  CALCIUM 9.6  --  9.4  GFRNONAA 35* 28* 27*  GFRAA 41* 32* 31*  ANIONGAP 14  --  10    No results for input(s): PROT, ALBUMIN, AST, ALT, ALKPHOS, BILITOT in the last 168 hours. Hematology  Recent Labs Lab 11/24/16 0906 11/24/16 2016 11/25/16 0325  WBC 9.0 8.7 9.1  RBC 3.81* 3.63*  3.65* 3.51*  HGB 10.3* 10.0* 9.5*  HCT 31.5* 30.2* 29.3*  MCV 82.7 83.2 83.5  MCH 27.0 27.5 27.1  MCHC 32.7 33.1 32.4  RDW 13.0 13.1 13.2  PLT 354 345 326   Cardiac Enzymes  Recent Labs Lab 11/24/16 2016 11/24/16 2334 11/25/16 0325  TROPONINI <0.03 <0.03 <0.03     Recent Labs Lab 11/24/16 0917 11/24/16 1447  TROPIPOC 0.03 0.01    BNPNo results for input(s): BNP, PROBNP in the last 168 hours.  DDimer No results for input(s): DDIMER in the last 168 hours.  Radiology/Studies:  Dg Chest 2 View  Result Date: 11/24/2016 CLINICAL DATA:  Cough and chest pain EXAM: CHEST  2 VIEW COMPARISON:  None. FINDINGS: Lungs are clear. Heart size and pulmonary vascularity are normal. No adenopathy. There is degenerative change in the thoracic spine. There is aortic atherosclerosis. IMPRESSION:  Aortic atherosclerosis.  No edema or consolidation. Aortic Atherosclerosis (ICD10-I70.0). Electronically Signed   By: Lowella Grip III M.D.   On: 11/24/2016 09:32   Ct Angio Chest/abd/pel For Dissection W And/or Wo Contrast  Result Date: 11/24/2016 CLINICAL DATA:  66 year old with four-day history of left-sided chest pain radiating into the upper back. Current history of type 2 diabetes and hypertension. Current smoker. EXAM: CT ANGIOGRAPHY CHEST, ABDOMEN AND PELVIS  TECHNIQUE: Multidetector CT imaging through the chest, abdomen and pelvis was performed using the standard protocol during bolus administration of intravenous contrast. Multiplanar reconstructed images and MIPs were obtained and reviewed to evaluate the vascular anatomy. CONTRAST:  80 mL Isovue 370 IV. COMPARISON:  No prior CT.  Chest x-ray performed earlier same day. FINDINGS: CTA CHEST FINDINGS Cardiovascular: No evidence of mural hematoma on the unenhanced images. Mild calcified plaque involving the descending thoracic and visualized upper abdominal aorta. No evidence of thoracic aortic aneurysm or dissection. Proximal great vessels widely patent with minimal atherosclerosis. Heart size normal with evidence of left ventricular hypertrophy. No visible coronary atherosclerosis. No pericardial effusion. Central pulmonary arteries widely patent. Mediastinum/Nodes: No pathologically enlarged mediastinal, hilar or axillary lymph nodes. No mediastinal masses. Normal-appearing esophagus. Thyroid gland normal in appearance. Lungs/Pleura: Mild emphysematous changes. Small bleb at the apex of the right lung. Mild right lower lobe bronchiectasis. Pulmonary parenchyma clear without localized airspace consolidation, interstitial disease, or parenchymal nodules or masses. Central airways patent without significant bronchial wall thickening. No pleural effusions. No pleural plaques or masses. Musculoskeletal: Degenerative changes involving the visualized lower cervical spine. Spondylosis involving mid and lower thoracic spine. Review of the MIP images confirms the above findings. CTA ABDOMEN AND PELVIS FINDINGS VASCULAR Aorta: Mild atherosclerosis. No evidence of abdominal aortic dissection or aneurysm. Celiac: Widely patent. SMA: Widely patent. Renals: Single renal arteries bilaterally, widely patent. IMA: Widely patent. Inflow: Mild bilateral common iliac artery atherosclerosis without evidence of stenosis. Bilateral external  iliac arteries and bilateral common femoral arteries widely patent. Veins: Not evaluated. Review of the MIP images confirms the above findings. NON-VASCULAR Hepatobiliary: Liver upper normal in size to perhaps mildly enlarged. No focal hepatic parenchymal abnormality. Approximate 1.2 cm calcified cholesterol gallstone within the otherwise normal-appearing gallbladder. No biliary ductal dilation. Pancreas: Mild pancreatic atrophy. Approximate 6 mm cystic lesion in the body of the pancreas (series 6, image 158). No other focal pancreatic parenchymal abnormality. Spleen: Normal allowing for the early arterial phase of enhancement. Adrenals/Urinary Tract: Mild bilateral adrenal enlargement without nodularity. Normal-appearing kidneys allowing for the early arterial phase of enhancement. No hydronephrosis. No visible urinary tract calculi. Urinary bladder normal in appearance. Stomach/Bowel: Stomach normal in appearance for the degree of distention. Normal-appearing small bowel. Normal appearing colon with expected colonic stool burden. Normal appearing long appendix in the right mid and lower pelvis. Lymphatic: No pathologic lymphadenopathy. Reproductive: Surgically absent uterus.  No adnexal masses. Other: Small umbilical hernia containing fat. Musculoskeletal: Facet degenerative changes at L4-5 and L5-S1. Mild degenerative disc disease at L4-5 and L5-S1. Review of the MIP images confirms the above findings. IMPRESSION: 1. No evidence of thoracic or abdominal aortic aneurysm or dissection. Mild aortic atherosclerosis (Aortic Atherosclerosis (ICD10-170.0)). 2.  Emphysema. (ICD10-J43.9) 3.  No acute cardiopulmonary disease. 4. No acute abnormalities involving the abdomen or pelvis. 5. 6 mm cystic lesion involving the body of the pancreas. Recommend follow up pre and post contrast MRI/MRCP or pancreatic protocol CT in 2 years (and every 2 years thereafter for total of 10 years if stable). This  recommendation follows ACR  consensus guidelines: Management of Incidental Pancreatic Cysts: A White Paper of the ACR Incidental Findings Committee. [place] 0160;10:[id number]. 6. Borderline to mild hepatomegaly. No focal hepatic parenchymal abnormality. 7. Cholelithiasis without evidence of acute cholecystitis. 8. Small umbilical hernia containing fat Electronically Signed   By: Evangeline Dakin M.D.   On: 11/24/2016 13:46    Assessment and Plan:   1. Chest pain: Pt with recent URI symptoms for the last 2 weeks, this week progressed to left chest pain to the left scapula area, intermittent and associated with shortness of breath. Prior to this episode no exertional symptoms. Troponins have been negative X5. No coronary calcium seen on CTA of chest which was negative for PE or dissection. EKG initially with slight inferior ST depression, now diffuse TWI noted. Picture is not ACS but pt could have underlying heart disease given her risk factors including long time smoker, age, uncontrolled DM, and HTN. Echo has been ordered, will look at LV function and wall motion. Pt should have ischemic testing. Will discuss timing with Dr. Sallyanne Kuster.   2. Hypertension: BP elevated on admission 208/116. Pt was not taking her meds at home due cost and lack of medical coverage. Now has turned 66 and has medicare. Home meds include amlodipine 5mg  and lisinopril/HCTZ 20/25. Currently ordered amlodipine 10 mg, Hydrochlorothiazide 25 mg, lisinopril 20 mg and metoprolol 12.5 mg. BP has improved but still elevated. Monitor response to this med regimen.   3. AKI: SCr up to 1.9. Likely related to recent uncontrolled hypertension and diabetes. Hopefully will improve with treatment of her chronic diseases. Continue ACE-I for renal protection.   3. Diabetes: Poorly controlled. Hgb A1c 10.5, has been as high as 13.0 in 02/2015. Pt states that she is taking insulin as ordered, but doubt good compliance. Diabetes coordinator has been consulted and  recommendations given. Management per IM.   4. Tobacco use: Pt smoke 1/2 PPD since age 61. Has never tried to quit. Discussed hazards of smoking related to vascular disease, heart attack and stroke especially in the setting of her other risk factors including HTN and diabetes. She verbalizes understanding and will consider cessation.    For questions or updates, please contact [place] Please consult [email or web address] for contact info under Cardiology/STEMI.   Signed, Daune Perch, NP  11/25/2016 11:22 AM   I have seen and examined the patient along with Daune Perch, NP.   I have reviewed the chart, notes and new data.  I agree with NP's note.  Key new complaints: chest pain is clearly not angina. On the other hand, she has very rich coronary risk factors and an abnormal ECG Key examination changes: wheezing. Normal CV exam. Key new findings / data: remarkable for poor glycemic control, renal dysfunction - acute (on chronic?), very low cholesterol. Troponin normal x 5. ECG shows NSR, high voltage and repol changes that could be due to either ischemia or LVH. Note absence of significant coronary calcification on chest CTA.  PLAN: She requires further evaluation for CAD due to the abnormal ECG, but this is not urgent. Currently not a good candidate for angiography (acute renal dysfunction) or nuclear perfusion imaging (acute respiratory problems). Recommend outpatient Lexiscan Myoview (plain ECG stress testing useless due to baseline ST changes) and follow up. Echo pending - will review - might need to accelerate her workup if the is frank LV regional dysfunction.  Sanda Klein, MD, [place] [phone] 11/25/2016, 12:22 PM

## 2016-11-25 NOTE — Progress Notes (Signed)
  Echocardiogram 2D Echocardiogram has been performed.  Merrie Roof F 11/25/2016, 2:11 PM

## 2016-11-25 NOTE — Progress Notes (Signed)
Nutrition Education Note   RD provided nutrition education regarding diabetes.   Lab Results  Component Value Date   HGBA1C 10.5 (H) 11/24/2016    RD provided "Nutrition and Type II Diabetes" handout from the Academy of Nutrition and Dietetics. Discussed different food groups and their effects on blood sugar, emphasizing carbohydrate-containing foods. Provided list of carbohydrates and recommended serving sizes of common foods.  Discussed importance of controlled and consistent carbohydrate intake throughout the day. Provided examples of ways to balance meals/snacks and encouraged intake of high-fiber, whole grain complex carbohydrates. Teach back method used.  Expect fair compliance.  Body mass index is 24.08 kg/m. Pt meets criteria for normal weight based on current BMI.  Current diet order is HH/CHO, patient is consuming approximately <25% of meals at this time.   RD following this pt  Sara Distance MS, RD, LDN Pager #[phone] After Hours Pager: [phone]

## 2016-11-25 NOTE — Care Management Note (Signed)
Case Management Note  Patient Details  Name: Sara Davis MRN: [id number] Date of Birth: November 28, 1950  Subjective/Objective:                 Patient in obs from home w spouse for CP and HTN. Cardiology consulted.    Action/Plan:  CM will continue to follow for DC planning.  Expected Discharge Date:  11/26/16               Expected Discharge Plan:  Home/Self Care  In-House Referral:     Discharge planning Services  CM Consult  Post Acute Care Choice:    Choice offered to:     DME Arranged:    DME Agency:     HH Arranged:    HH Agency:     Status of Service:  In process, will continue to follow  If discussed at Long Length of Stay Meetings, dates discussed:    Additional Comments:  Carles Collet, RN 11/25/2016, 2:05 PM

## 2016-11-25 NOTE — Progress Notes (Signed)
Inpatient Diabetes Program Recommendations  AACE/ADA: New Consensus Statement on Inpatient Glycemic Control (2015)  Target Ranges:  Prepandial:   less than 140 mg/dL      Peak postprandial:   less than 180 mg/dL (1-2 hours)      Critically ill patients:  140 - 180 mg/dL   Lab Results  Component Value Date   GLUCAP 271 (H) 11/25/2016   HGBA1C 10.5 (H) 11/24/2016    Review of Glycemic Control  Diabetes history: DM2 Outpatient Diabetes medications: Novolin 70/30 20 units bid Current orders for Inpatient glycemic control: Lantus 20 units QD, Novolog 0-9 units tidwc  Inpatient Diabetes Program Recommendations:    Increase Lantus to 25 units QD Novolog 4 units tidwc for meal coverage insulin  Will speak with pt about her HgbA1C of 10.5%.   Thank you. Lorenda Peck, RD, LDN, CDE Inpatient Diabetes Coordinator [phone]

## 2016-11-26 ENCOUNTER — Other Ambulatory Visit: Payer: Self-pay | Admitting: Cardiology

## 2016-11-26 ENCOUNTER — Inpatient Hospital Stay (HOSPITAL_COMMUNITY): Payer: Medicare HMO

## 2016-11-26 DIAGNOSIS — N179 Acute kidney failure, unspecified: Secondary | ICD-10-CM

## 2016-11-26 DIAGNOSIS — R079 Chest pain, unspecified: Secondary | ICD-10-CM

## 2016-11-26 LAB — BASIC METABOLIC PANEL
Anion gap: 8 (ref 5–15)
BUN: 33 mg/dL — ABNORMAL HIGH (ref 6–20)
CHLORIDE: 104 mmol/L (ref 101–111)
CO2: 24 mmol/L (ref 22–32)
Calcium: 9.4 mg/dL (ref 8.9–10.3)
Creatinine, Ser: 1.9 mg/dL — ABNORMAL HIGH (ref 0.44–1.00)
GFR calc non Af Amer: 27 mL/min — ABNORMAL LOW (ref >60)
GFR, EST AFRICAN AMERICAN: 31 mL/min — AB (ref >60)
Glucose, Bld: 250 mg/dL — ABNORMAL HIGH (ref 65–99)
POTASSIUM: 3.6 mmol/L (ref 3.5–5.1)
SODIUM: 136 mmol/L (ref 135–145)

## 2016-11-26 LAB — URINALYSIS, ROUTINE W REFLEX MICROSCOPIC
Bilirubin Urine: NEGATIVE
Hgb urine dipstick: NEGATIVE
Ketones, ur: NEGATIVE mg/dL
NITRITE: NEGATIVE
SPECIFIC GRAVITY, URINE: 1.014 (ref 1.005–1.030)
pH: 5 (ref 5.0–8.0)

## 2016-11-26 LAB — CBC
HCT: 28.5 % — ABNORMAL LOW (ref 36.0–46.0)
HEMOGLOBIN: 9.6 g/dL — AB (ref 12.0–15.0)
MCH: 28 pg (ref 26.0–34.0)
MCHC: 33.7 g/dL (ref 30.0–36.0)
MCV: 83.1 fL (ref 78.0–100.0)
Platelets: 333 10*3/uL (ref 150–400)
RBC: 3.43 MIL/uL — AB (ref 3.87–5.11)
RDW: 13.3 % (ref 11.5–15.5)
WBC: 13.4 10*3/uL — ABNORMAL HIGH (ref 4.0–10.5)

## 2016-11-26 LAB — GLUCOSE, CAPILLARY
GLUCOSE-CAPILLARY: 322 mg/dL — AB (ref 65–99)
GLUCOSE-CAPILLARY: 265 mg/dL — AB (ref 65–99)
Glucose-Capillary: 256 mg/dL — ABNORMAL HIGH (ref 65–99)

## 2016-11-26 LAB — CREATININE, URINE, RANDOM: Creatinine, Urine: 96.4 mg/dL

## 2016-11-26 MED ORDER — POLYETHYLENE GLYCOL 3350 17 G PO PACK
17.0000 g | PACK | Freq: Two times a day (BID) | ORAL | Status: AC
  Administered 2016-11-26 – 2016-11-27 (×2): 17 g via ORAL
  Filled 2016-11-26 (×2): qty 1

## 2016-11-26 MED ORDER — INSULIN GLARGINE 100 UNIT/ML ~~LOC~~ SOLN
25.0000 [IU] | Freq: Every day | SUBCUTANEOUS | Status: DC
  Administered 2016-11-27: 25 [IU] via SUBCUTANEOUS
  Filled 2016-11-26: qty 0.25

## 2016-11-26 NOTE — Progress Notes (Signed)
Family Medicine Teaching Service Daily Progress Note Intern Pager: [phone]  Patient name: Sara Davis Medical record number: [id number] Date of birth: Jun 14, 1950 Age: 66 y.o. Gender: female  Primary Care Provider: Smiley Houseman, MD Consultants: cardio Code Status: full  Pt Overview and Major Events to Date:  Sara Davis is a 66 y.o. female presenting with Chest pain beginning yesterday . PMH is significant for uncontrolled T2DM, tobacco abuse, and HTN.     Assessment and Plan: [name] is a 66 y.o. female presenting with Chest pain beginning yesterday . PMH is significant for uncontrolled T2DM, tobacco abuse, and HTN.   Chest pain, acute: resolved Obs for chest pain rule out.  HEART score on admission 7.  CTA negative for AAA or dissection. CXR showing no edema or consolidation. EKG on admission showing ST depressions in leads II, III, aVF, V5, and V6 showing possible inferior/lateral origin. On admission BP was 226/118 with some vision changes and headache reported. Troponins negative x 5. ECHO showed Normal LV systolic function; moderate LVH; mild diastolic dysfunction; mild LAE; mild TR. Cardiology consulted... -Admit to FMTS, admitting attending Dr. Gwendlyn Deutscher -Admit to telemetry -am CBC and CMP -cardiology consulted, appreciate recommendations -Vitals per floor  -Nitroglycerin prn -Tylenol 650 mg q6 -Zofran 4 mg q6 -metoprolol 12.5 BID -atorvastatin 20mg  -aspirin 81 -cardio recommended outpatient lexiscan myoview  HTN, uncontrolled 147/71 Secondary to non-compliance. Patient reports not taking her BP medications x 1 month. BP elevated on admission. Noted to have BP with systolic in 696E and diastolic > 952 in ED. On my exam BP of 181/92 on cardiac monitor. Patient endorses symptoms of headache and vision changes (right eye blurry). Home medications of amlodipine and lisinopril-HCTZ. Patient was given hydralazine 10 mg in the ED. There was slight improvement noted  after receiving nitroglycerin for chest pain on initial presentation. -continue Norvasc, lisinopril/HCTZ  -also started metoprolol 12.5bid -will balance HTN vs fluid needs for AKI  AKI -plateauing at 1.9 Cr of 1.51 on admission, 1.9 on 10/24. Per chart review baseline appears to be around 1.0-1.1. Elevated creatinine likely secondary to poor intake in the past few days secondary to illness as well as an organ damage from a uncontrolled  blood pressure. This also could be a component of diabetic nephropathy in the setting of uncontrolled T2DM. -NS @ 100 cc/hr -Follow-up on a.m. BMP  T2DM, uncontrolled Last A1c on record was 12.1 on 03/06/2016. Home meds of Novolin 70/30 20 units in am, 10 units in pm, metformin 1000mg  bid, lantus 40 units daily. Patient reports not taking her medications this morning  -discontinue metformin while inpatient given AKI -Restart lantus 20 units daily -Start patient on sensitive SSI -monitor CBGs before meals/daily at bedtime -A1c ordered  Cough, acute Patient reports cough x 1 week. No sputum production noted on exam. CXR notes no edema or consolidation.  -continue to monitor  Anemia, chronic 9.6 Hgb on admission of 10.3. Per chart review appears to be chronically anemic. Patient is not reporting symptoms of dizziness or lightheadedness. Last anemia panel on 02/24/2015 was within normal limits.. -Follow-up on anemia panel  Tobacco abuse  Patient reports 1/2 ppd smoking history. Recently decreased to 1/2 ppd and has been smoking since 66 y.o.  -nicotine patch -encourage smoking cessation   Constipation, subacute Patient reports 2 weeks constipation. States had one small soft stool this am.  -miralax daily prn   FEN/GI: heart healthy/carb modified diet  Prophylaxis: lovenox   Disposition: likely d/c to  home pending AKI improvement  Subjective:  Pain free and feeling well other than bed being uncomfortable.  She is concerned about cardio  workup and wants to get the outpatient test soon, would like to be home as soon as it is safe.  Objective: Temp:  [98.1 F (36.7 C)-98.9 F (37.2 C)] 98.9 F (37.2 C) (10/24 2008) Pulse Rate:  [71-76] 74 (10/24 2008) Resp:  [10-15] 15 (10/24 2008) BP: (150-166)/(63-93) 150/71 (10/24 2008) SpO2:  [97 %-98 %] 97 % (10/24 2008) Physical Exam: General: awake and alert, sitting in bed, NAD Cardiovascular: RRR, no MRG  Respiratory: CTAB, no wheezes rales or rhonchi , no IWB Gastrointestinal: soft, non tender, non distended, bowel sounds normal  MSK: non tender, no edema Derm: no rashes noted, intact  Neuro: no focal deficits, sensation intact Psych: normal affect   Laboratory:  Recent Labs Lab 11/24/16 0906 11/24/16 2016 11/25/16 0325  WBC 9.0 8.7 9.1  HGB 10.3* 10.0* 9.5*  HCT 31.5* 30.2* 29.3*  PLT 354 345 326    Recent Labs Lab 11/24/16 0906 11/24/16 2016 11/25/16 0325  NA 138  --  138  K 3.7  --  3.5  CL 106  --  103  CO2 18*  --  25  BUN 26*  --  31*  CREATININE 1.51* 1.83* 1.90*  CALCIUM 9.6  --  9.4  GLUCOSE 355*  --  291*    Troponin <0.03 x3  Imaging/Diagnostic Tests: Dg Chest 2 View  Result Date: 11/24/2016 CLINICAL DATA:  Cough and chest pain EXAM: CHEST  2 VIEW COMPARISON:  None. FINDINGS: Lungs are clear. Heart size and pulmonary vascularity are normal. No adenopathy. There is degenerative change in the thoracic spine. There is aortic atherosclerosis. IMPRESSION: Aortic atherosclerosis.  No edema or consolidation. Aortic Atherosclerosis (ICD10-I70.0). Electronically Signed   By: Lowella Grip III M.D.   On: 11/24/2016 09:32   Ct Angio Chest/abd/pel For Dissection W And/or Wo Contrast  Result Date: 11/24/2016 CLINICAL DATA:  66 year old with four-day history of left-sided chest pain radiating into the upper back. Current history of type 2 diabetes and hypertension. Current smoker. EXAM: CT ANGIOGRAPHY CHEST, ABDOMEN AND PELVIS TECHNIQUE:  Multidetector CT imaging through the chest, abdomen and pelvis was performed using the standard protocol during bolus administration of intravenous contrast. Multiplanar reconstructed images and MIPs were obtained and reviewed to evaluate the vascular anatomy. CONTRAST:  80 mL Isovue 370 IV. COMPARISON:  No prior CT.  Chest x-ray performed earlier same day. FINDINGS: CTA CHEST FINDINGS Cardiovascular: No evidence of mural hematoma on the unenhanced images. Mild calcified plaque involving the descending thoracic and visualized upper abdominal aorta. No evidence of thoracic aortic aneurysm or dissection. Proximal great vessels widely patent with minimal atherosclerosis. Heart size normal with evidence of left ventricular hypertrophy. No visible coronary atherosclerosis. No pericardial effusion. Central pulmonary arteries widely patent. Mediastinum/Nodes: No pathologically enlarged mediastinal, hilar or axillary lymph nodes. No mediastinal masses. Normal-appearing esophagus. Thyroid gland normal in appearance. Lungs/Pleura: Mild emphysematous changes. Small bleb at the apex of the right lung. Mild right lower lobe bronchiectasis. Pulmonary parenchyma clear without localized airspace consolidation, interstitial disease, or parenchymal nodules or masses. Central airways patent without significant bronchial wall thickening. No pleural effusions. No pleural plaques or masses. Musculoskeletal: Degenerative changes involving the visualized lower cervical spine. Spondylosis involving mid and lower thoracic spine. Review of the MIP images confirms the above findings. CTA ABDOMEN AND PELVIS FINDINGS VASCULAR Aorta: Mild atherosclerosis. No evidence of abdominal aortic dissection  or aneurysm. Celiac: Widely patent. SMA: Widely patent. Renals: Single renal arteries bilaterally, widely patent. IMA: Widely patent. Inflow: Mild bilateral common iliac artery atherosclerosis without evidence of stenosis. Bilateral external iliac  arteries and bilateral common femoral arteries widely patent. Veins: Not evaluated. Review of the MIP images confirms the above findings. NON-VASCULAR Hepatobiliary: Liver upper normal in size to perhaps mildly enlarged. No focal hepatic parenchymal abnormality. Approximate 1.2 cm calcified cholesterol gallstone within the otherwise normal-appearing gallbladder. No biliary ductal dilation. Pancreas: Mild pancreatic atrophy. Approximate 6 mm cystic lesion in the body of the pancreas (series 6, image 158). No other focal pancreatic parenchymal abnormality. Spleen: Normal allowing for the early arterial phase of enhancement. Adrenals/Urinary Tract: Mild bilateral adrenal enlargement without nodularity. Normal-appearing kidneys allowing for the early arterial phase of enhancement. No hydronephrosis. No visible urinary tract calculi. Urinary bladder normal in appearance. Stomach/Bowel: Stomach normal in appearance for the degree of distention. Normal-appearing small bowel. Normal appearing colon with expected colonic stool burden. Normal appearing long appendix in the right mid and lower pelvis. Lymphatic: No pathologic lymphadenopathy. Reproductive: Surgically absent uterus.  No adnexal masses. Other: Small umbilical hernia containing fat. Musculoskeletal: Facet degenerative changes at L4-5 and L5-S1. Mild degenerative disc disease at L4-5 and L5-S1. Review of the MIP images confirms the above findings. IMPRESSION: 1. No evidence of thoracic or abdominal aortic aneurysm or dissection. Mild aortic atherosclerosis (Aortic Atherosclerosis (ICD10-170.0)). 2.  Emphysema. (ICD10-J43.9) 3.  No acute cardiopulmonary disease. 4. No acute abnormalities involving the abdomen or pelvis. 5. 6 mm cystic lesion involving the body of the pancreas. Recommend follow up pre and post contrast MRI/MRCP or pancreatic protocol CT in 2 years (and every 2 years thereafter for total of 10 years if stable). This recommendation follows ACR  consensus guidelines: Management of Incidental Pancreatic Cysts: A White Paper of the ACR Incidental Findings Committee. [place] 1610;[id number]. 6. Borderline to mild hepatomegaly. No focal hepatic parenchymal abnormality. 7. Cholelithiasis without evidence of acute cholecystitis. 8. Small umbilical hernia containing fat Electronically Signed   By: Evangeline Dakin M.D.   On: 11/24/2016 13:46     Sherene Sires, DO 11/26/2016, 2:23 AM PGY-1, [place] Intern pager: [phone], text pages welcome

## 2016-11-26 NOTE — Progress Notes (Signed)
Progress Note  Patient Name: Sara Davis Date of Encounter: 11/26/2016  Primary Cardiologist: Dr. Sallyanne Kuster   Subjective   No new complaints. CP free.   Inpatient Medications    Scheduled Meds: . amLODipine  10 mg Oral Daily  . aspirin EC  81 mg Oral Daily  . atorvastatin  20 mg Oral q1800  . enoxaparin (LOVENOX) injection  30 mg Subcutaneous Q24H  . hydrochlorothiazide  25 mg Oral Daily  . insulin aspart  0-9 Units Subcutaneous TID WC  . [START ON 11/27/2016] insulin glargine  25 Units Subcutaneous Daily  . metoprolol tartrate  12.5 mg Oral BID  . multivitamin with minerals  1 tablet Oral Daily  . protein supplement shake  2 oz Oral BID BM   Continuous Infusions: . sodium chloride     PRN Meds: acetaminophen **OR** acetaminophen, nitroGLYCERIN, ondansetron **OR** ondansetron (ZOFRAN) IV, polyethylene glycol   Vital Signs    Vitals:   11/25/16 1501 11/25/16 2008 11/26/16 0327 11/26/16 0856  BP: (!) 161/63 (!) 150/71 (!) 147/71 139/89  Pulse: 76 74 71   Resp: 12 15 14 17   Temp: 98.8 F (37.1 C) 98.9 F (37.2 C) 98.4 F (36.9 C)   TempSrc: Oral Oral Oral   SpO2:  97% 97%   Weight:      Height:        Intake/Output Summary (Last 24 hours) at 11/26/16 1130 Last data filed at 11/26/16 3149  Gross per 24 hour  Intake              358 ml  Output                0 ml  Net              358 ml   Filed Weights   11/24/16 2032  Weight: 149 lb 3.2 oz (67.7 kg)    Telemetry    NSR - Personally Reviewed  ECG    NSR with repol abn - Personally Reviewed  Physical Exam   GEN: No acute distress.   Neck: No JVD Cardiac: RRR, no murmurs, rubs, or gallops.  Respiratory: Clear to auscultation bilaterally. GI: Soft, nontender, non-distended  MS: No edema; No deformity. Neuro:  Nonfocal  Psych: Normal affect   Labs    Chemistry Recent Labs Lab 11/24/16 0906 11/24/16 2016 11/25/16 0325 11/26/16 0243  NA 138  --  138 136  K 3.7  --  3.5 3.6  CL 106   --  103 104  CO2 18*  --  25 24  GLUCOSE 355*  --  291* 250*  BUN 26*  --  31* 33*  CREATININE 1.51* 1.83* 1.90* 1.90*  CALCIUM 9.6  --  9.4 9.4  GFRNONAA 35* 28* 27* 27*  GFRAA 41* 32* 31* 31*  ANIONGAP 14  --  10 8     Hematology Recent Labs Lab 11/24/16 2016 11/25/16 0325 11/26/16 0243  WBC 8.7 9.1 13.4*  RBC 3.63*  3.65* 3.51* 3.43*  HGB 10.0* 9.5* 9.6*  HCT 30.2* 29.3* 28.5*  MCV 83.2 83.5 83.1  MCH 27.5 27.1 28.0  MCHC 33.1 32.4 33.7  RDW 13.1 13.2 13.3  PLT 345 326 333    Cardiac Enzymes Recent Labs Lab 11/24/16 2016 11/24/16 2334 11/25/16 0325  TROPONINI <0.03 <0.03 <0.03    Recent Labs Lab 11/24/16 0917 11/24/16 1447  TROPIPOC 0.03 0.01     BNPNo results for input(s): BNP, PROBNP in the last 168  hours.   DDimer No results for input(s): DDIMER in the last 168 hours.   Radiology    Ct Angio Chest/abd/pel For Dissection W And/or Wo Contrast  Result Date: 11/24/2016 CLINICAL DATA:  66 year old with four-day history of left-sided chest pain radiating into the upper back. Current history of type 2 diabetes and hypertension. Current smoker. EXAM: CT ANGIOGRAPHY CHEST, ABDOMEN AND PELVIS TECHNIQUE: Multidetector CT imaging through the chest, abdomen and pelvis was performed using the standard protocol during bolus administration of intravenous contrast. Multiplanar reconstructed images and MIPs were obtained and reviewed to evaluate the vascular anatomy. CONTRAST:  80 mL Isovue 370 IV. COMPARISON:  No prior CT.  Chest x-ray performed earlier same day. FINDINGS: CTA CHEST FINDINGS Cardiovascular: No evidence of mural hematoma on the unenhanced images. Mild calcified plaque involving the descending thoracic and visualized upper abdominal aorta. No evidence of thoracic aortic aneurysm or dissection. Proximal great vessels widely patent with minimal atherosclerosis. Heart size normal with evidence of left ventricular hypertrophy. No visible coronary atherosclerosis.  No pericardial effusion. Central pulmonary arteries widely patent. Mediastinum/Nodes: No pathologically enlarged mediastinal, hilar or axillary lymph nodes. No mediastinal masses. Normal-appearing esophagus. Thyroid gland normal in appearance. Lungs/Pleura: Mild emphysematous changes. Small bleb at the apex of the right lung. Mild right lower lobe bronchiectasis. Pulmonary parenchyma clear without localized airspace consolidation, interstitial disease, or parenchymal nodules or masses. Central airways patent without significant bronchial wall thickening. No pleural effusions. No pleural plaques or masses. Musculoskeletal: Degenerative changes involving the visualized lower cervical spine. Spondylosis involving mid and lower thoracic spine. Review of the MIP images confirms the above findings. CTA ABDOMEN AND PELVIS FINDINGS VASCULAR Aorta: Mild atherosclerosis. No evidence of abdominal aortic dissection or aneurysm. Celiac: Widely patent. SMA: Widely patent. Renals: Single renal arteries bilaterally, widely patent. IMA: Widely patent. Inflow: Mild bilateral common iliac artery atherosclerosis without evidence of stenosis. Bilateral external iliac arteries and bilateral common femoral arteries widely patent. Veins: Not evaluated. Review of the MIP images confirms the above findings. NON-VASCULAR Hepatobiliary: Liver upper normal in size to perhaps mildly enlarged. No focal hepatic parenchymal abnormality. Approximate 1.2 cm calcified cholesterol gallstone within the otherwise normal-appearing gallbladder. No biliary ductal dilation. Pancreas: Mild pancreatic atrophy. Approximate 6 mm cystic lesion in the body of the pancreas (series 6, image 158). No other focal pancreatic parenchymal abnormality. Spleen: Normal allowing for the early arterial phase of enhancement. Adrenals/Urinary Tract: Mild bilateral adrenal enlargement without nodularity. Normal-appearing kidneys allowing for the early arterial phase of  enhancement. No hydronephrosis. No visible urinary tract calculi. Urinary bladder normal in appearance. Stomach/Bowel: Stomach normal in appearance for the degree of distention. Normal-appearing small bowel. Normal appearing colon with expected colonic stool burden. Normal appearing long appendix in the right mid and lower pelvis. Lymphatic: No pathologic lymphadenopathy. Reproductive: Surgically absent uterus.  No adnexal masses. Other: Small umbilical hernia containing fat. Musculoskeletal: Facet degenerative changes at L4-5 and L5-S1. Mild degenerative disc disease at L4-5 and L5-S1. Review of the MIP images confirms the above findings. IMPRESSION: 1. No evidence of thoracic or abdominal aortic aneurysm or dissection. Mild aortic atherosclerosis (Aortic Atherosclerosis (ICD10-170.0)). 2.  Emphysema. (ICD10-J43.9) 3.  No acute cardiopulmonary disease. 4. No acute abnormalities involving the abdomen or pelvis. 5. 6 mm cystic lesion involving the body of the pancreas. Recommend follow up pre and post contrast MRI/MRCP or pancreatic protocol CT in 2 years (and every 2 years thereafter for total of 10 years if stable). This recommendation follows ACR consensus guidelines: Management of  Incidental Pancreatic Cysts: A White Paper of the ACR Incidental Findings Committee. [place] 1610;[id number]. 6. Borderline to mild hepatomegaly. No focal hepatic parenchymal abnormality. 7. Cholelithiasis without evidence of acute cholecystitis. 8. Small umbilical hernia containing fat Electronically Signed   By: Evangeline Dakin M.D.   On: 11/24/2016 13:46    Cardiac Studies   2D Echo 11/25/16 Study Conclusions  - Left ventricle: The cavity size was normal. Wall thickness was   increased in a pattern of moderate LVH. Systolic function was   normal. The estimated ejection fraction was in the range of 55%   to 60%. Wall motion was normal; there were no regional wall   motion abnormalities. Doppler parameters are  consistent with   abnormal left ventricular relaxation (grade 1 diastolic   dysfunction). - Mitral valve: Calcified annulus. - Left atrium: The atrium was mildly dilated.  Impressions:  - Normal LV systolic function; moderate LVH; mild diastolic   dysfunction; mild LAE; mild TR.  Patient Profile     Sara Davis is a 66 y.o. female with a hx of poorly controlled DM type 2, tobacco use and HTN who is being seen for the evaluation of chest pain at the request of Dr. Criss Rosales.  Assessment & Plan    1. Chest Pain: cardiac enzymes negative x 5. Chest CT negative for PE and disection and no coronary calcium seen on CT. EKGs show repol changes that may be due to ischemia vs LVH. 2D echo yesterday showed moderate LVH, normal LVEF and normal wall motion. As mentioned before, she is currently not a good candidate for LHC given acute renal dysfunction and not a good candidate for Lexiscan NST at this time given acute respiratory problems. We will plan for outpatient ischemic testing, NST once she recovers in 2 weeks.   2. HTN: controlled on current regimen.   3. T2DM: management per IM.  4. Tobacco Abuse: smoking cessation advised.   5. AKI: baseline SCr ~1.0-1.1> SCr on admit was 1.5 and spiked to 1.9. Level is same today as yesterday at 1.9. Continue to monitor.   Cardiology will arrange f/u and outpatient stress test.     For questions or updates, please contact [place] Please consult [email or web address] for contact info under Cardiology/STEMI.      Signed, Lyda Jester, PA-C  11/26/2016, 11:30 AM    I have seen and examined the patient along with Lyda Jester, PA-C .  I have reviewed the chart, notes and new data.  I agree with PA's note.  Key new complaints: no chest pain, breathing has improved Key examination changes: no CV exam abnormalities Key new findings / data: echo without regional wall motion abnormalities, EF is normal. Creatinin remains elevated above  baseline  PLAN: Will schedule for an outpatient Lexiscan Myoview after full recovery from her respiratory illness. No other plans for CV workup during this admission. Continue daily low dose ASA. Smoking cessation discussed at length.  Sanda Klein, MD, [place] [phone] 11/26/2016, 11:52 AM

## 2016-11-26 NOTE — Progress Notes (Signed)
Order for outpatient NST placed. Our office will call w/ appt.  \Sara Davis

## 2016-11-26 NOTE — Progress Notes (Signed)
Inpatient Diabetes Program Recommendations  AACE/ADA: New Consensus Statement on Inpatient Glycemic Control (2015)  Target Ranges:  Prepandial:   less than 140 mg/dL      Peak postprandial:   less than 180 mg/dL (1-2 hours)      Critically ill patients:  140 - 180 mg/dL   Lab Results  Component Value Date   GLUCAP 256 (H) 11/26/2016   HGBA1C 10.5 (H) 11/24/2016    Review of Glycemic Control  Post-prandial blood sugars elevated. Needs meal coverage  Inpatient Diabetes Program Recommendations:   Increase Lantus to 25 units QD Novolog 4 units tidwc for meal coverage insulin.  Continue to follow.  Thank you. Lorenda Peck, RD, LDN, CDE Inpatient Diabetes Coordinator [phone]

## 2016-11-27 DIAGNOSIS — E43 Unspecified severe protein-calorie malnutrition: Secondary | ICD-10-CM

## 2016-11-27 LAB — CBC
HEMATOCRIT: 27.2 % — AB (ref 36.0–46.0)
HEMOGLOBIN: 9 g/dL — AB (ref 12.0–15.0)
MCH: 27.5 pg (ref 26.0–34.0)
MCHC: 33.1 g/dL (ref 30.0–36.0)
MCV: 83.2 fL (ref 78.0–100.0)
Platelets: 320 10*3/uL (ref 150–400)
RBC: 3.27 MIL/uL — ABNORMAL LOW (ref 3.87–5.11)
RDW: 12.9 % (ref 11.5–15.5)
WBC: 8.6 10*3/uL (ref 4.0–10.5)

## 2016-11-27 LAB — GLUCOSE, CAPILLARY
GLUCOSE-CAPILLARY: 282 mg/dL — AB (ref 65–99)
GLUCOSE-CAPILLARY: 339 mg/dL — AB (ref 65–99)
Glucose-Capillary: 285 mg/dL — ABNORMAL HIGH (ref 65–99)

## 2016-11-27 LAB — BASIC METABOLIC PANEL
ANION GAP: 9 (ref 5–15)
BUN: 35 mg/dL — AB (ref 6–20)
CHLORIDE: 99 mmol/L — AB (ref 101–111)
CO2: 26 mmol/L (ref 22–32)
CREATININE: 2.15 mg/dL — AB (ref 0.44–1.00)
Calcium: 9.3 mg/dL (ref 8.9–10.3)
GFR calc non Af Amer: 23 mL/min — ABNORMAL LOW (ref >60)
GFR, EST AFRICAN AMERICAN: 27 mL/min — AB (ref >60)
GLUCOSE: 328 mg/dL — AB (ref 65–99)
Potassium: 3.3 mmol/L — ABNORMAL LOW (ref 3.5–5.1)
SODIUM: 134 mmol/L — AB (ref 135–145)

## 2016-11-27 LAB — UREA NITROGEN, URINE: Urea Nitrogen, Ur: 520 mg/dL

## 2016-11-27 MED ORDER — CEPHALEXIN 250 MG PO CAPS
250.0000 mg | ORAL_CAPSULE | Freq: Three times a day (TID) | ORAL | Status: DC
  Administered 2016-11-27 – 2016-12-01 (×13): 250 mg via ORAL
  Filled 2016-11-27 (×15): qty 1

## 2016-11-27 MED ORDER — SODIUM CHLORIDE 0.9 % IV SOLN
INTRAVENOUS | Status: DC
  Administered 2016-11-27 – 2016-11-28 (×2): via INTRAVENOUS

## 2016-11-27 MED ORDER — SODIUM CHLORIDE 0.9 % IV SOLN: INTRAVENOUS | Status: DC

## 2016-11-27 MED ORDER — INSULIN ASPART PROT & ASPART (70-30 MIX) 100 UNIT/ML ~~LOC~~ SUSP
30.0000 [IU] | Freq: Two times a day (BID) | SUBCUTANEOUS | Status: DC
  Filled 2016-11-27: qty 10

## 2016-11-27 MED ORDER — POTASSIUM CHLORIDE CRYS ER 20 MEQ PO TBCR
30.0000 meq | EXTENDED_RELEASE_TABLET | Freq: Once | ORAL | Status: AC
  Administered 2016-11-27: 09:00:00 30 meq via ORAL
  Filled 2016-11-27: qty 1

## 2016-11-27 NOTE — Progress Notes (Signed)
Social Note:   I introduced myself to Ms. Hum as I have not met her yet in clinic. She is doing well and had no concerns this morning. Appreciate the great care provided by the inpatient FM service.   Smiley Houseman, MD PGY 3 Family Medicine

## 2016-11-27 NOTE — Evaluation (Signed)
Physical Therapy Evaluation Patient Details Name: Sara Davis MRN: [id number] DOB: 06-Feb-1950 Today's Date: 11/27/2016   History of Present Illness  66 Y/O F with PMX of DM2, HTN, Depression presenting with 1-week hx of intermittent left sided chest pain which got worse last night. Pain is sharp in nature radiating to her left shoulder. Pain is associated with diaphoresis, nausea without vomiting and SOB. Dx with HTN, AKI, uncontrolled DM2.  Clinical Impression  Pt admitted with above diagnosis. Pt currently with functional limitations due to the deficits listed below (see PT Problem List). PTA pt independent with all mobility, however reports she notices her legs get tired with increasd ambulation and her gait tends to drift. Pt is currently mod I with bed mobility and supervision for transfers and ambulation of 150 feet while pushing her IV pole. Pt will benefit from skilled PT to increase their independence and safety with mobility to allow discharge to the venue listed below.       Follow Up Recommendations No PT follow up    Equipment Recommendations  None recommended by PT    Recommendations for Other Services       Precautions / Restrictions Precautions Precautions: None Restrictions Weight Bearing Restrictions: No      Mobility  Bed Mobility Overal bed mobility: Modified Independent             General bed mobility comments: HoB elevated, pt utilized bedrails to come to upright  Transfers Overall transfer level: Needs assistance Equipment used: None Transfers: Sit to/from Stand Sit to Stand: Supervision         General transfer comment: supervision for safety, good power up and steadying in standing   Ambulation/Gait Ambulation/Gait assistance: Supervision Ambulation Distance (Feet): 150 Feet Assistive device:  (pt pushed IV pole) Gait Pattern/deviations: Step-through pattern;Drifts right/left Gait velocity: slowed Gait velocity interpretation: Below  normal speed for age/gender General Gait Details: supervision for safety, no LoB, no buckling, however as gait progressed pt began to drift left and right with gait         Balance Overall balance assessment: Needs assistance Sitting-balance support: Feet supported;No upper extremity supported Sitting balance-Leahy Scale: Normal     Standing balance support: No upper extremity supported;During functional activity Standing balance-Leahy Scale: Good Standing balance comment: steady with static standing  Single Leg Stance - Right Leg: 0 Single Leg Stance - Left Leg: 0     Rhomberg - Eyes Opened: 30 Rhomberg - Eyes Closed: 10                 Pertinent Vitals/Pain Pain Assessment: No/denies pain    Home Living Family/patient expects to be discharged to:: Private residence Living Arrangements: Spouse/significant other Available Help at Discharge: Family;Available 24 hours/day Type of Home: House Home Access: Stairs to enter Entrance Stairs-Rails: None Entrance Stairs-Number of Steps: 2 Home Layout: One level Home Equipment: None      Prior Function Level of Independence: Independent         Comments: community ambulator and driver, independent with ADLs and iADLs        Extremity/Trunk Assessment   Upper Extremity Assessment Upper Extremity Assessment: Overall WFL for tasks assessed    Lower Extremity Assessment Lower Extremity Assessment: Generalized weakness       Communication   Communication: No difficulties  Cognition Arousal/Alertness: Awake/alert Behavior During Therapy: WFL for tasks assessed/performed Overall Cognitive Status: Within Functional Limits for tasks assessed  General Comments General comments (skin integrity, edema, etc.): VSS throughout session         Assessment/Plan    PT Assessment Patient needs continued PT services  PT Problem List Decreased balance;Decreased  strength       PT Treatment Interventions Balance training;Therapeutic exercise;Therapeutic activities;Functional mobility training;Stair training;Gait training;Patient/family education    PT Goals (Current goals can be found in the Care Plan section)  Acute Rehab PT Goals Patient Stated Goal: go home PT Goal Formulation: With patient Time For Goal Achievement: 12/04/16 Potential to Achieve Goals: Good    Frequency Min 3X/week    AM-PAC PT "6 Clicks" Daily Activity  Outcome Measure Difficulty turning over in bed (including adjusting bedclothes, sheets and blankets)?: A Little Difficulty moving from lying on back to sitting on the side of the bed? : A Little Difficulty sitting down on and standing up from a chair with arms (e.g., wheelchair, bedside commode, etc,.)?: A Little Help needed moving to and from a bed to chair (including a wheelchair)?: None Help needed walking in hospital room?: None Help needed climbing 3-5 steps with a railing? : A Little 6 Click Score: 20    End of Session Equipment Utilized During Treatment: Gait belt Activity Tolerance: Patient tolerated treatment well Patient left: in bed;with call bell/phone within reach;with family/visitor present Nurse Communication: Mobility status;Other (comment) (occluded IV) PT Visit Diagnosis: Other abnormalities of gait and mobility (R26.89);Muscle weakness (generalized) (M62.81)    Time: 4562-5638 PT Time Calculation (min) (ACUTE ONLY): 26 min   Charges:   PT Evaluation $PT Eval Moderate Complexity: 1 Mod PT Treatments $Gait Training: 8-22 mins   PT G Codes:        Vaishali Baise B. Migdalia Dk PT, DPT Acute Rehabilitation  [phone] Pager [phone]    [place] 11/27/2016, 12:16 PM

## 2016-11-27 NOTE — Plan of Care (Signed)
Problem: Acute Rehab PT Goals(only PT should resolve) Goal: Pt Will Perform Standing Balance Or Pre-Gait Work on dynamic balance and higher level balance activities

## 2016-11-27 NOTE — Progress Notes (Signed)
Family Medicine Teaching Service Daily Progress Note Intern Pager: [phone]  Patient name: Sara Davis Medical record number: [id number] Date of birth: 07/03/1950 Age: 66 y.o. Gender: female  Primary Care Provider: Smiley Houseman, MD Consultants: cardio Code Status: full  Pt Overview and Major Events to Date:  Sara Davis is a 66 y.o. female presenting with Chest pain beginning yesterday . PMH is significant for uncontrolled T2DM, tobacco abuse, and HTN.     Assessment and Plan: Sara Davis is a 66 y.o. female presenting with Chest pain beginning yesterday . PMH is significant for uncontrolled T2DM, tobacco abuse, and HTN.   Chest pain Workup thus far, CTA negative for AAA or dissection. CXR showing no edema or consolidation. EKG on admission showing ST depressions in leads II, III, aVF, V5, and V6 showing possible inferior/lateral origin. troponins negative x5. Echo showed Normal LV systolic function; moderate LVH; mild diastolic dysfunction; mild LAE; mild TR. Cardiology was consulted and has been following. Signed off 10/26. Will get myoview as outpatient. Patient had chest pain for a few seconds this am. Will get ekg to ensure no changes. - cardiology signed off, myoview as outpatient - ekg this am - nitroglycerin prn - tylenol 650mg  q 6 hours - zofran 4mg  q 6 prn - metoprolol 12.5mg  BID - atorvastatin 20mg  - asa 81mg   Hypertension Patient with one month non-compliance. bp in 220s/100s. Home medications amlodipine, hctz/lisinopril, metoprolol. Received IV hydralazine in the ed. Patient with bp 98/79 10/26. Currently holding hctz and lisinopril due to aki. Will continue to follow. Can increase metoprolol if hypertensive. - continue 12.5mg  metoprolol bid - amlodipine 10mg  daily - Can increase metoprolol dose if hypertensive  AKI Baseline 1.0-1.1. Cr of 1.51 on admission, 1.9 on 10/24. 2.15mg  10/26. Most likely initial insult due to poor med compliance leading to  htn. Patient has received contrast with cta, and has been receiving lisinopril and hctz since admission. Will hold hctz and lisinopril today. Can control hypertension with amlodipine and metoprolol. Will also increase fluid rate to 179mL/hr. She was [facility] for unclear reasons this am. - normal saline at 158mL/hr - daily bmp - avoid nephrotoxic meds  T2DM, uncontrolled  A1C 10.5 on admission, improved from 12.1 03/06/2016. Home meds novolin 70/30, 20units in am, 10 units pm, metformin 1000mg  bid, lantus 40 units daily. Have attempted to get patient switched to lantus as outpatient but this does not appear to be financially viable for her. Will need to follow up as outpatient for this. Will likely dc on home regimen. Glucose 328 this am on bmp. Receiving 25 U lantus this am, continue sliding scale. Can restart on novolog 70/30 10/27. - lantus 25U daily - sSSI - monitor cbg before meals/daily at bedtime  Bacteria in Urine Patient with large Leukocyte esterase and bacteria on UA. Denies any symptoms. Given increase creatinine it is difficult to rule out a uti in this individual. Will start on kelfex due to renal issues. Keflex 250mg  q 8 hours for 10 days. - start kelflex 250mg  q 8 hours  Cough Patient reports cough x 1 week. No sputum production noted on exam. CXR notes no edema or consolidation. Resolved. -continue to monitor  Anemia, chronic 9.6 Hgb on admission of 10.3. Per chart review appears to be chronically anemic. Patient is not reporting symptoms of dizziness or lightheadedness. Iron panel w/n/l from 10/23.  Tobacco abuse  Patient reports 1/2 ppd smoking history. Recently decreased to 1/2 ppd and has been smoking since 66  y.o.  -nicotine patch -encourage smoking cessation   Constipation, subacute Patient reports 2 weeks constipation. States had one small soft stool this am.  -miralax daily prn   FEN/GI: heart healthy/carb modified diet  Prophylaxis: lovenox   Disposition:  likely d/c to home pending AKI improvement  Subjective: Patient complains of some short acting chest pain this morning on her left side. It is not changed from previous pains. Otherwise tolerating PO with no n/v. Urinating well.   Objective: Temp:  [98 F (36.7 C)-99 F (37.2 C)] 98 F (36.7 C) (10/26 0351) Pulse Rate:  [73-81] 73 (10/26 0351) Resp:  [19] 19 (10/26 0351) BP: (98-150)/(73-82) 98/79 (10/26 0351) SpO2:  [95 %-96 %] 95 % (10/26 0351) Physical Exam: General: awake and alert, sitting in bed, NAD Cardiovascular: RRR, no MRG. Palpable pt/dp bilaterally Respiratory: CTAB, no wheezes rales or rhonchi , no IWB Gastrointestinal: soft, non tender, non distended, bowel sounds normal  MSK: non tender, no edema Derm: no rashes noted, intact  Neuro: cn 2-12 intact, no focal neuro deficits Psych: normal affect   Laboratory:  Recent Labs Lab 11/25/16 0325 11/26/16 0243 11/27/16 0229  WBC 9.1 13.4* 8.6  HGB 9.5* 9.6* 9.0*  HCT 29.3* 28.5* 27.2*  PLT 326 333 320    Recent Labs Lab 11/25/16 0325 11/26/16 0243 11/27/16 0229  NA 138 136 134*  K 3.5 3.6 3.3*  CL 103 104 99*  CO2 25 24 26   BUN 31* 33* 35*  CREATININE 1.90* 1.90* 2.15*  CALCIUM 9.4 9.4 9.3  GLUCOSE 291* 250* 328*    Troponin <0.03 x3  Imaging/Diagnostic Tests: Dg Chest 2 View  Result Date: 11/24/2016 CLINICAL DATA:  Cough and chest pain EXAM: CHEST  2 VIEW COMPARISON:  None. FINDINGS: Lungs are clear. Heart size and pulmonary vascularity are normal. No adenopathy. There is degenerative change in the thoracic spine. There is aortic atherosclerosis. IMPRESSION: Aortic atherosclerosis.  No edema or consolidation. Aortic Atherosclerosis (ICD10-I70.0). Electronically Signed   By: Lowella Grip III M.D.   On: 11/24/2016 09:32   Ct Angio Chest/abd/pel For Dissection W And/or Wo Contrast  Result Date: 11/24/2016 CLINICAL DATA:  66 year old with four-day history of left-sided chest pain radiating into  the upper back. Current history of type 2 diabetes and hypertension. Current smoker. EXAM: CT ANGIOGRAPHY CHEST, ABDOMEN AND PELVIS TECHNIQUE: Multidetector CT imaging through the chest, abdomen and pelvis was performed using the standard protocol during bolus administration of intravenous contrast. Multiplanar reconstructed images and MIPs were obtained and reviewed to evaluate the vascular anatomy. CONTRAST:  80 mL Isovue 370 IV. COMPARISON:  No prior CT.  Chest x-ray performed earlier same day. FINDINGS: CTA CHEST FINDINGS Cardiovascular: No evidence of mural hematoma on the unenhanced images. Mild calcified plaque involving the descending thoracic and visualized upper abdominal aorta. No evidence of thoracic aortic aneurysm or dissection. Proximal great vessels widely patent with minimal atherosclerosis. Heart size normal with evidence of left ventricular hypertrophy. No visible coronary atherosclerosis. No pericardial effusion. Central pulmonary arteries widely patent. Mediastinum/Nodes: No pathologically enlarged mediastinal, hilar or axillary lymph nodes. No mediastinal masses. Normal-appearing esophagus. Thyroid gland normal in appearance. Lungs/Pleura: Mild emphysematous changes. Small bleb at the apex of the right lung. Mild right lower lobe bronchiectasis. Pulmonary parenchyma clear without localized airspace consolidation, interstitial disease, or parenchymal nodules or masses. Central airways patent without significant bronchial wall thickening. No pleural effusions. No pleural plaques or masses. Musculoskeletal: Degenerative changes involving the visualized lower cervical spine. Spondylosis involving mid and  lower thoracic spine. Review of the MIP images confirms the above findings. CTA ABDOMEN AND PELVIS FINDINGS VASCULAR Aorta: Mild atherosclerosis. No evidence of abdominal aortic dissection or aneurysm. Celiac: Widely patent. SMA: Widely patent. Renals: Single renal arteries bilaterally, widely  patent. IMA: Widely patent. Inflow: Mild bilateral common iliac artery atherosclerosis without evidence of stenosis. Bilateral external iliac arteries and bilateral common femoral arteries widely patent. Veins: Not evaluated. Review of the MIP images confirms the above findings. NON-VASCULAR Hepatobiliary: Liver upper normal in size to perhaps mildly enlarged. No focal hepatic parenchymal abnormality. Approximate 1.2 cm calcified cholesterol gallstone within the otherwise normal-appearing gallbladder. No biliary ductal dilation. Pancreas: Mild pancreatic atrophy. Approximate 6 mm cystic lesion in the body of the pancreas (series 6, image 158). No other focal pancreatic parenchymal abnormality. Spleen: Normal allowing for the early arterial phase of enhancement. Adrenals/Urinary Tract: Mild bilateral adrenal enlargement without nodularity. Normal-appearing kidneys allowing for the early arterial phase of enhancement. No hydronephrosis. No visible urinary tract calculi. Urinary bladder normal in appearance. Stomach/Bowel: Stomach normal in appearance for the degree of distention. Normal-appearing small bowel. Normal appearing colon with expected colonic stool burden. Normal appearing long appendix in the right mid and lower pelvis. Lymphatic: No pathologic lymphadenopathy. Reproductive: Surgically absent uterus.  No adnexal masses. Other: Small umbilical hernia containing fat. Musculoskeletal: Facet degenerative changes at L4-5 and L5-S1. Mild degenerative disc disease at L4-5 and L5-S1. Review of the MIP images confirms the above findings. IMPRESSION: 1. No evidence of thoracic or abdominal aortic aneurysm or dissection. Mild aortic atherosclerosis (Aortic Atherosclerosis (ICD10-170.0)). 2.  Emphysema. (ICD10-J43.9) 3.  No acute cardiopulmonary disease. 4. No acute abnormalities involving the abdomen or pelvis. 5. 6 mm cystic lesion involving the body of the pancreas. Recommend follow up pre and post contrast  MRI/MRCP or pancreatic protocol CT in 2 years (and every 2 years thereafter for total of 10 years if stable). This recommendation follows ACR consensus guidelines: Management of Incidental Pancreatic Cysts: A White Paper of the ACR Incidental Findings Committee. Banks [id number]. 6. Borderline to mild hepatomegaly. No focal hepatic parenchymal abnormality. 7. Cholelithiasis without evidence of acute cholecystitis. 8. Small umbilical hernia containing fat Electronically Signed   By: Evangeline Dakin M.D.   On: 11/24/2016 13:46     Guadalupe Dawn, MD 11/27/2016, 9:19 AM PGY-1, [place] Intern pager: [phone], text pages welcome

## 2016-11-28 ENCOUNTER — Other Ambulatory Visit: Payer: Self-pay

## 2016-11-28 DIAGNOSIS — B029 Zoster without complications: Secondary | ICD-10-CM

## 2016-11-28 LAB — URINE CULTURE

## 2016-11-28 LAB — BASIC METABOLIC PANEL
ANION GAP: 9 (ref 5–15)
BUN: 38 mg/dL — ABNORMAL HIGH (ref 6–20)
CALCIUM: 9.1 mg/dL (ref 8.9–10.3)
CHLORIDE: 103 mmol/L (ref 101–111)
CO2: 23 mmol/L (ref 22–32)
Creatinine, Ser: 2.21 mg/dL — ABNORMAL HIGH (ref 0.44–1.00)
GFR calc non Af Amer: 22 mL/min — ABNORMAL LOW (ref >60)
GFR, EST AFRICAN AMERICAN: 26 mL/min — AB (ref >60)
Glucose, Bld: 328 mg/dL — ABNORMAL HIGH (ref 65–99)
Potassium: 4.1 mmol/L (ref 3.5–5.1)
Sodium: 135 mmol/L (ref 135–145)

## 2016-11-28 LAB — GLUCOSE, CAPILLARY
GLUCOSE-CAPILLARY: 311 mg/dL — AB (ref 65–99)
GLUCOSE-CAPILLARY: 209 mg/dL — AB (ref 65–99)
Glucose-Capillary: 221 mg/dL — ABNORMAL HIGH (ref 65–99)
Glucose-Capillary: 155 mg/dL — ABNORMAL HIGH (ref 65–99)

## 2016-11-28 MED ORDER — SODIUM CHLORIDE 0.9 % IV SOLN
510.0000 mg | Freq: Once | INTRAVENOUS | Status: AC
  Administered 2016-11-28: 510 mg via INTRAVENOUS
  Filled 2016-11-28: qty 17

## 2016-11-28 MED ORDER — VALACYCLOVIR HCL 500 MG PO TABS
500.0000 mg | ORAL_TABLET | Freq: Every day | ORAL | Status: DC
  Administered 2016-11-29: 500 mg via ORAL
  Filled 2016-11-28: qty 1

## 2016-11-28 MED ORDER — VALACYCLOVIR HCL 500 MG PO TABS
1000.0000 mg | ORAL_TABLET | Freq: Every day | ORAL | Status: DC
  Administered 2016-11-28: 1000 mg via ORAL

## 2016-11-28 MED ORDER — CARVEDILOL 25 MG PO TABS
25.0000 mg | ORAL_TABLET | Freq: Two times a day (BID) | ORAL | Status: DC
  Administered 2016-11-28 – 2016-11-29 (×2): 25 mg via ORAL
  Filled 2016-11-28 (×2): qty 1

## 2016-11-28 MED ORDER — DIPHENHYDRAMINE HCL 50 MG/ML IJ SOLN
25.0000 mg | Freq: Once | INTRAMUSCULAR | Status: AC
  Administered 2016-11-28: 25 mg via INTRAVENOUS
  Filled 2016-11-28: qty 1

## 2016-11-28 MED ORDER — METOPROLOL TARTRATE 25 MG PO TABS: 25.0000 mg | ORAL_TABLET | Freq: Two times a day (BID) | ORAL | Status: DC

## 2016-11-28 MED ORDER — INSULIN ASPART PROT & ASPART (70-30 MIX) 100 UNIT/ML ~~LOC~~ SUSP
20.0000 [IU] | Freq: Two times a day (BID) | SUBCUTANEOUS | Status: DC
  Administered 2016-11-28 – 2016-12-01 (×7): 20 [IU] via SUBCUTANEOUS
  Filled 2016-11-28: qty 10

## 2016-11-28 MED ORDER — FUROSEMIDE 80 MG PO TABS
80.0000 mg | ORAL_TABLET | Freq: Two times a day (BID) | ORAL | Status: DC
  Administered 2016-11-28 – 2016-11-29 (×2): 80 mg via ORAL
  Filled 2016-11-28 (×2): qty 1

## 2016-11-28 NOTE — Consult Note (Signed)
Reason for Consult:CKD/AKI Referring Physician: Dr. Deanna Artis JABREA KALLSTROM is an 66 y.o. female.  HPI: 66 yr female with at least 10 yr of DM, longer hx HTN, admitted with atyical CP.  Cr 1.51 on admit and >'300mg'$  % protein but still give signif amt of dye with CT angio chest, abdm , aorta. Also accelerated HTN , out of control, had stopped meds.   Cr has steadily risen to 2.21 over 4 d.   Has UTI now.  Denies hx of UTIs, stones or FH or renal, hearing, eye or ms skel defects.  Takes Alleve several times/wk, was given Lisinopril on admit also.  Variable skin rash, not consistent and no arthritis . No hx laser surgery on eyes.  Ankes swell with sitting, and has DOE <22f.  No PND, orthop but Nocturia.  No hx hepatitis and no PPIs.  Smoker sinc age 89   Constitutional: see admitting sx, CP and weak Eyes: glasses, floaters Ears, nose, mouth, throat, and face: negative Respiratory: as above, no cough or sputum Cardiovascular: as above Gastrointestinal: constip,  Genitourinary:burning now Integument/breast: variable rashes , not consistent Musculoskeletal:negative Neurological: negative Endocrine: DM Allergic/Immunologic: ASA   Past Medical History:  Diagnosis Date  . Cellulitis of scalp 02/2015  . Depression   . Diabetes mellitus without complication ([facility]   . Hypertension     Past Surgical History:  Procedure Laterality Date  . ABDOMINAL HYSTERECTOMY      Family History  Problem Relation Age of Onset  . Diabetes Mother   . Cancer Mother   . Diabetes Sister   . Hypertension Sister   . Cancer Sister 568      Bone  . Diabetes Brother   . Diabetes Sister   . Diabetes Sister     Social History:  reports that she has been smoking Cigarettes.  She has a 20.00 pack-year smoking history. She has never used smokeless tobacco. She reports that she does not drink alcohol or use drugs.  Allergies:  Allergies  Allergen Reactions  . Aspirin Other (See Comments)    Upset stomach     Medications:  I have reviewed the patient's current medications. Prior to Admission:  Prescriptions Prior to Admission  Medication Sig Dispense Refill Last Dose  . acetaminophen (TYLENOL) 500 MG tablet Take 1,000 mg by mouth every 6 (six) hours as needed for mild pain or fever.   Past Week at Unknown time  . Blood Glucose Monitoring Suppl (CONTOUR BLOOD GLUCOSE SYSTEM) DEVI Use to test blood sugars once in the morning before food and once in the evening.DX: 250.02 1 Device 0 unk at unk  . glucose blood (ONE TOUCH TEST STRIPS) test strip Please check blood sugar once daily in AM before eating and once daily in PM 100 each 12 Past Week at Unknown time  . insulin NPH-regular Human (NOVOLIN 70/30) (70-30) 100 UNIT/ML injection Inject 20 units in the AM, inject 10 units in PM 12 hours later (Patient taking differently: Inject 20 Units into the skin 2 (two) times daily. ) 1 vial 3 Past Week at Unknown time  . Insulin Pen Needle (B-D ULTRAFINE III SHORT PEN) 31G X 8 MM MISC CHECK BLOOD SUGARS ONCE IN MORNING BEFORE FOOD AND ONCE IN THE EVENING. 100 each 0 Past Week at Unknown time  . Lancets (ONETOUCH ULTRASOFT) lancets Please check blood sugar once daily in AM before eating and once daily in PM 100 each 12 Past Week at Unknown time  .  metFORMIN (GLUCOPHAGE) 1000 MG tablet Take 1 tablet (1,000 mg total) by mouth 2 (two) times daily with a meal. 60 tablet 2 Past Week at Unknown time  . amLODipine (NORVASC) 5 MG tablet Take 1 tablet (5 mg total) by mouth daily. (Patient not taking: Reported on 11/24/2016) 90 tablet 0 Not Taking at Unknown time  . gabapentin (NEURONTIN) 300 MG capsule TAKE 1 CAPSULE(300 MG) BY MOUTH THREE TIMES DAILY (Patient not taking: Reported on 11/24/2016) 270 capsule 2 Not Taking at Unknown time  . insulin glargine (LANTUS) 100 UNIT/ML injection Inject 0.4 mLs (40 Units total) into the skin daily. (Patient not taking: Reported on 03/06/2016) 10 mL 12 Not Taking  .  lisinopril-hydrochlorothiazide (PRINZIDE,ZESTORETIC) 20-25 MG tablet Take 1 tablet by mouth daily. (Patient not taking: Reported on 11/24/2016) 90 tablet 3 Not Taking at Unknown time    Results for orders placed or performed during the hospital encounter of 11/24/16 (from the past 48 hour(s))  Urinalysis, Routine w reflex microscopic     Status: Abnormal   Collection Time: 11/26/16  5:24 PM  Result Value Ref Range   Color, Urine YELLOW YELLOW   APPearance TURBID (A) CLEAR   Specific Gravity, Urine 1.014 1.005 - 1.030   pH 5.0 5.0 - 8.0   Glucose, UA >=500 (A) NEGATIVE mg/dL   Hgb urine dipstick NEGATIVE NEGATIVE   Bilirubin Urine NEGATIVE NEGATIVE   Ketones, ur NEGATIVE NEGATIVE mg/dL   Protein, ur >=996 (A) NEGATIVE mg/dL   Nitrite NEGATIVE NEGATIVE   Leukocytes, UA LARGE (A) NEGATIVE   RBC / HPF 0-5 0 - 5 RBC/hpf   WBC, UA TOO NUMEROUS TO COUNT 0 - 5 WBC/hpf   Bacteria, UA MANY (A) NONE SEEN   Squamous Epithelial / LPF 0-5 (A) NONE SEEN   WBC Clumps PRESENT    Non Squamous Epithelial 0-5 (A) NONE SEEN  Urea nitrogen, urine     Status: None   Collection Time: 11/26/16  5:25 PM  Result Value Ref Range   Urea Nitrogen, Ur 520 Not Estab. mg/dL    Comment: (NOTE) Performed At: [facility] [address] [place], [place] [id number] Mila Homer MD [id number]   Creatinine, urine, random     Status: None   Collection Time: 11/26/16  5:25 PM  Result Value Ref Range   Creatinine, Urine 96.40 mg/dL  Glucose, capillary     Status: Abnormal   Collection Time: 11/26/16  9:20 PM  Result Value Ref Range   Glucose-Capillary 265 (H) 65 - 99 mg/dL  CBC     Status: Abnormal   Collection Time: 11/27/16  2:29 AM  Result Value Ref Range   WBC 8.6 4.0 - 10.5 K/uL   RBC 3.27 (L) 3.87 - 5.11 MIL/uL   Hemoglobin 9.0 (L) 12.0 - 15.0 g/dL   HCT 15.8 (L) 35.8 - 19.5 %   MCV 83.2 78.0 - 100.0 fL   MCH 27.5 26.0 - 34.0 pg   MCHC 33.1 30.0 - 36.0 g/dL   RDW 23.4 43.8 - 14.6 %    Platelets 320 150 - 400 K/uL  Basic metabolic panel     Status: Abnormal   Collection Time: 11/27/16  2:29 AM  Result Value Ref Range   Sodium 134 (L) 135 - 145 mmol/L   Potassium 3.3 (L) 3.5 - 5.1 mmol/L   Chloride 99 (L) 101 - 111 mmol/L   CO2 26 22 - 32 mmol/L   Glucose, Bld 328 (H) 65 - 99 mg/dL  BUN 35 (H) 6 - 20 mg/dL   Creatinine, Ser 2.15 (H) 0.44 - 1.00 mg/dL   Calcium 9.3 8.9 - 10.3 mg/dL   GFR calc non Af Amer 23 (L) >60 mL/min   GFR calc Af Amer 27 (L) >60 mL/min    Comment: (NOTE) The eGFR has been calculated using the CKD EPI equation. This calculation has not been validated in all clinical situations. eGFR's persistently <60 mL/min signify possible Chronic Kidney Disease.    Anion gap 9 5 - 15  Glucose, capillary     Status: Abnormal   Collection Time: 11/27/16 11:58 AM  Result Value Ref Range   Glucose-Capillary 285 (H) 65 - 99 mg/dL   Comment 1 Notify RN    Comment 2 Document in Chart   Culture, Urine     Status: Abnormal   Collection Time: 11/27/16 12:16 PM  Result Value Ref Range   Specimen Description URINE, CLEAN CATCH    Special Requests NONE    Culture (A)     >=100,000 COLONIES/mL GROUP B STREP(S.AGALACTIAE)ISOLATED TESTING AGAINST S. AGALACTIAE NOT ROUTINELY PERFORMED DUE TO PREDICTABILITY OF AMP/PEN/VAN SUSCEPTIBILITY.    Report Status 11/28/2016 FINAL   Glucose, capillary     Status: Abnormal   Collection Time: 11/27/16  5:30 PM  Result Value Ref Range   Glucose-Capillary 282 (H) 65 - 99 mg/dL  Glucose, capillary     Status: Abnormal   Collection Time: 11/27/16  9:15 PM  Result Value Ref Range   Glucose-Capillary 339 (H) 65 - 99 mg/dL  Basic metabolic panel     Status: Abnormal   Collection Time: 11/28/16  3:07 AM  Result Value Ref Range   Sodium 135 135 - 145 mmol/L   Potassium 4.1 3.5 - 5.1 mmol/L    Comment: DELTA CHECK NOTED   Chloride 103 101 - 111 mmol/L   CO2 23 22 - 32 mmol/L   Glucose, Bld 328 (H) 65 - 99 mg/dL   BUN 38 (H) 6  - 20 mg/dL   Creatinine, Ser 2.21 (H) 0.44 - 1.00 mg/dL   Calcium 9.1 8.9 - 10.3 mg/dL   GFR calc non Af Amer 22 (L) >60 mL/min   GFR calc Af Amer 26 (L) >60 mL/min    Comment: (NOTE) The eGFR has been calculated using the CKD EPI equation. This calculation has not been validated in all clinical situations. eGFR's persistently <60 mL/min signify possible Chronic Kidney Disease.    Anion gap 9 5 - 15  Glucose, capillary     Status: Abnormal   Collection Time: 11/28/16  6:03 AM  Result Value Ref Range   Glucose-Capillary 311 (H) 65 - 99 mg/dL  Glucose, capillary     Status: Abnormal   Collection Time: 11/28/16 11:34 AM  Result Value Ref Range   Glucose-Capillary 209 (H) 65 - 99 mg/dL   Comment 1 Notify RN    Comment 2 Document in Chart     US Renal  Result Date: 11/26/2016 CLINICAL DATA:  Acute renal insufficiency. EXAM: RENAL / URINARY TRACT ULTRASOUND COMPLETE COMPARISON:  None. FINDINGS: Right Kidney: Length: 10.5 cm. Echogenicity within normal limits. No mass or hydronephrosis visualized. Left Kidney: Length: 10.8 cm. Echogenicity within normal limits. No mass or hydronephrosis visualized. Bladder: Appears normal for degree of bladder distention. IMPRESSION: 1. No cause for acute renal insufficiency identified. 2. Cholelithiasis. Electronically Signed   By: Dorise Bullion III M.D   On: 11/26/2016 15:15    ROS Blood pressure Marland Kitchen)  194/86, pulse 73, temperature 98.4 F (36.9 C), temperature source Oral, resp. rate 17, height 5' 6" (1.676 m), weight 67.7 kg (149 lb 3.2 oz), SpO2 100 %. Physical Exam Physical Examination: General appearance - alert, well appearing, and in no distress and oriented to person, place, and time Mental status - alert, oriented to person, place, and time, normal mood, behavior, speech, dress, motor activity, and thought processes Eyes - hypertensive changes to fundi, catarract on L Mouth - edentulous Neck - adenopathy noted PCL Lymphatics - posterior  cervical nodes Chest - decreased air entry noted bilat Heart - S1 and S2 normal, systolic murmur DL8/3 at 2nd left intercostal space, carotid bruit noted on R Abdomen - soft, liver down 3 cm Musculoskeletal - no joint tenderness, deformity or swelling Extremities - peripheral pulses normal, no pedal edema, no clubbing or cyanosis Skin - old macular rash upper back  Assessment/Plan: 1 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  Suspect baseline GFR with good bp control is in 20s ie CKD4 2 AKI related to dye exposure, lowering bp both.  Should stabilze but may get worse first with better bp control  3 Hypertension: needs better control, change to carvedilol and add Lasix 4. Anemia Fe low and may needs esa 5. Metabolic Bone Disease: check PTH, limit phos in diet 6 DM needs control 7 Smoking needs to d/c 8 UTI doubt playing much of a role P Carvedilol, lasix, SPEP, U P/C, PTH, Fe,  Avoid dye, Start Low Na, low phos, DM diet  Geraldina Parrott L 11/28/2016, 2:21 PM

## 2016-11-28 NOTE — Progress Notes (Signed)
Started infusion of Ferumoxytol on patient at 41. Patient began itching, having chills and stated she was not feeling well. Stopped infusion at 1623. Notified provider .

## 2016-11-28 NOTE — Progress Notes (Signed)
Pt complained of chest pain. EKG was taken and 2 doses of nitro given. Bp decreased to 137/73 and pt stated that chest pain subsided. MD called and instructed to dc fluids and continue to monitor.

## 2016-11-28 NOTE — Progress Notes (Signed)
Family Medicine Teaching Service Daily Progress Note Intern Pager: [phone]  Patient name: Sara Davis Medical record number: [id number] Date of birth: July 24, 1950 Age: 66 y.o. Gender: female  Primary Care Provider: Smiley Houseman, MD Consultants: cardio Code Status: full  Pt Overview and Major Events to Date:  10/23-admitted with chest pain for ACS rule out 10/23-CT negative for AAA or dissection.  10/24-Echo basically normal.  Assessment and Plan: Sara Davis is a 66 y.o. female presenting with Chest pain beginning yesterday . PMH is significant for uncontrolled T2DM, tobacco abuse, and HTN.   Chest pain: description of pain this morning and newly developing rash concerning herpes zoster. Troponin negative 5. EKG with chronic ST depression. Per cardiology, not a good candidate for LHC due to acute renal dysfunction. Also not a good candidate for Lexiscan due to respiratory issue. However, patient seems to be doing well from resp stand point. Patient had chest pain overnight. EKG obtained and without significant change from prior.Talked to cardiology (Dr. Horace Porteous) again this morning. He suggested to stick with the outpatient Myoview.  - Nitroglycerin prn - Tylenol 650mg  q 6 hours - Zofran 4mg  q 6 prn - Metoprolol 12.5mg  BID - Atorvastatin 20mg  - Asa 81mg   Skin rash: Likely herpes zoster.  -Start valacyclovir 1 g every 24 hr based on her estimated renal clearance of 24 ml/min -May need to adjust dosing daily based on renal clearance  Hypertension: hypertensive with SBP to 172 and DBP to 102 overnight.  IV fluids discontinued. Normotensive this morning.  - Continue 12.5mg  metoprolol bid - Amlodipine 10mg  daily - Can increase metoprolol dose if hypertensive  AKI: Serum creatinine is slowly trending up from 1.51 on admission to 2.21 this morning. Baseline 1.0-1.1.  AKI is multifactorial (contrast, iatrogenic, ?dehydration). Lisinopril and HCTZ discontinued. FE urea 33%  suggestive for prerenal etiology. Renal ultrasound with normal size kidney for age and normal echogenicity. Received normal saline at 125 mL/h yesterday but reduced to 75 mL/h overnight due to elevated blood pressures - Increase normal saline to 150 mL/h - daily bmp - avoid nephrotoxic meds - Follow-up nephrology recommendations  T2DM, uncontrolled: A1c 10.5. Patient was unable to afford Lantus. She was also on metformin 1000 twice a day. Uses Novolin 70/30 20 units in a.m. and 10 units in p.m. she has been on Lantus 25 units and a sliding scale here. CBG consistently in 300s.  -Switch back to home Novolin 70/30 20 units twice a day - sSSI - monitor cbg  UTI: Patient with suprapubic tenderness on admission. UA with LE and bacteria.Marland Kitchen - start kelflex 250mg  q 8 hours 10/26> - Follow-up urine culture  Anemia of chronic disease: Hemoglobin at baseline, ~10. Iron panel w/n/l from 10/23. -Will monitor as needed   Tobacco abuse: patient reports 1/2 ppd smoking history. -nicotine patch -encourage smoking cessation   Constipation, subacute: Last bowel movement 10/23 after days of constipation -Scheduled MiraLAX 2, then when necessary  FEN/GI: heart healthy/carb modified diet   Prophylaxis: lovenox. Renally dosed  Disposition:  continue inpatient management pending improvement in AKI  Subjective:  Patient reports chest pain across her left chest and upper back. She described the pain as burning. She also reports new skin eruption over her back posteriorly. She denies similar skin rash in the past or history of shingles.   Objective: Temp:  [98.9 F (37.2 C)] 98.9 F (37.2 C) (10/26 1952) Pulse Rate:  [74-82] 81 (10/27 0448) Resp:  [13-17] 13 (10/27 0448) BP: (126-172)/(71-102) 137/73 (  10/27 0448) SpO2:  [97 %-100 %] 99 % (10/27 0448) Physical Exam: General: awake and alert, sitting in bed, NAD Cardiovascular: RRR. Respiratory: CTAB, no wheezes rales or rhonchi , no  IWB Gastrointestinal: soft, non tender, non distended, bowel sounds normal  MSK: non tender, no edema Derm: New spots of excoriated skin with surrounding skin erythema. No pus or blisters.  Neuro: cn 2-12 intact, no focal neuro deficits Psych: normal affect   Laboratory:  Recent Labs Lab 11/25/16 0325 11/26/16 0243 11/27/16 0229  WBC 9.1 13.4* 8.6  HGB 9.5* 9.6* 9.0*  HCT 29.3* 28.5* 27.2*  PLT 326 333 320    Recent Labs Lab 11/26/16 0243 11/27/16 0229 11/28/16 0307  NA 136 134* 135  K 3.6 3.3* 4.1  CL 104 99* 103  CO2 24 26 23   BUN 33* 35* 38*  CREATININE 1.90* 2.15* 2.21*  CALCIUM 9.4 9.3 9.1  GLUCOSE 250* 328* 328*    Troponin <0.03 x3  Imaging/Diagnostic Tests: Dg Chest 2 View  Result Date: 11/24/2016 CLINICAL DATA:  Cough and chest pain EXAM: CHEST  2 VIEW COMPARISON:  None. FINDINGS: Lungs are clear. Heart size and pulmonary vascularity are normal. No adenopathy. There is degenerative change in the thoracic spine. There is aortic atherosclerosis. IMPRESSION: Aortic atherosclerosis.  No edema or consolidation. Aortic Atherosclerosis (ICD10-I70.0). Electronically Signed   By: Lowella Grip III M.D.   On: 11/24/2016 09:32   Ct Angio Chest/abd/pel For Dissection W And/or Wo Contrast  Result Date: 11/24/2016 CLINICAL DATA:  66 year old with four-day history of left-sided chest pain radiating into the upper back. Current history of type 2 diabetes and hypertension. Current smoker. EXAM: CT ANGIOGRAPHY CHEST, ABDOMEN AND PELVIS TECHNIQUE: Multidetector CT imaging through the chest, abdomen and pelvis was performed using the standard protocol during bolus administration of intravenous contrast. Multiplanar reconstructed images and MIPs were obtained and reviewed to evaluate the vascular anatomy. CONTRAST:  80 mL Isovue 370 IV. COMPARISON:  No prior CT.  Chest x-ray performed earlier same day. FINDINGS: CTA CHEST FINDINGS Cardiovascular: No evidence of mural hematoma on  the unenhanced images. Mild calcified plaque involving the descending thoracic and visualized upper abdominal aorta. No evidence of thoracic aortic aneurysm or dissection. Proximal great vessels widely patent with minimal atherosclerosis. Heart size normal with evidence of left ventricular hypertrophy. No visible coronary atherosclerosis. No pericardial effusion. Central pulmonary arteries widely patent. Mediastinum/Nodes: No pathologically enlarged mediastinal, hilar or axillary lymph nodes. No mediastinal masses. Normal-appearing esophagus. Thyroid gland normal in appearance. Lungs/Pleura: Mild emphysematous changes. Small bleb at the apex of the right lung. Mild right lower lobe bronchiectasis. Pulmonary parenchyma clear without localized airspace consolidation, interstitial disease, or parenchymal nodules or masses. Central airways patent without significant bronchial wall thickening. No pleural effusions. No pleural plaques or masses. Musculoskeletal: Degenerative changes involving the visualized lower cervical spine. Spondylosis involving mid and lower thoracic spine. Review of the MIP images confirms the above findings. CTA ABDOMEN AND PELVIS FINDINGS VASCULAR Aorta: Mild atherosclerosis. No evidence of abdominal aortic dissection or aneurysm. Celiac: Widely patent. SMA: Widely patent. Renals: Single renal arteries bilaterally, widely patent. IMA: Widely patent. Inflow: Mild bilateral common iliac artery atherosclerosis without evidence of stenosis. Bilateral external iliac arteries and bilateral common femoral arteries widely patent. Veins: Not evaluated. Review of the MIP images confirms the above findings. NON-VASCULAR Hepatobiliary: Liver upper normal in size to perhaps mildly enlarged. No focal hepatic parenchymal abnormality. Approximate 1.2 cm calcified cholesterol gallstone within the otherwise normal-appearing gallbladder. No biliary ductal  dilation. Pancreas: Mild pancreatic atrophy. Approximate 6  mm cystic lesion in the body of the pancreas (series 6, image 158). No other focal pancreatic parenchymal abnormality. Spleen: Normal allowing for the early arterial phase of enhancement. Adrenals/Urinary Tract: Mild bilateral adrenal enlargement without nodularity. Normal-appearing kidneys allowing for the early arterial phase of enhancement. No hydronephrosis. No visible urinary tract calculi. Urinary bladder normal in appearance. Stomach/Bowel: Stomach normal in appearance for the degree of distention. Normal-appearing small bowel. Normal appearing colon with expected colonic stool burden. Normal appearing long appendix in the right mid and lower pelvis. Lymphatic: No pathologic lymphadenopathy. Reproductive: Surgically absent uterus.  No adnexal masses. Other: Small umbilical hernia containing fat. Musculoskeletal: Facet degenerative changes at L4-5 and L5-S1. Mild degenerative disc disease at L4-5 and L5-S1. Review of the MIP images confirms the above findings. IMPRESSION: 1. No evidence of thoracic or abdominal aortic aneurysm or dissection. Mild aortic atherosclerosis (Aortic Atherosclerosis (ICD10-170.0)). 2.  Emphysema. (ICD10-J43.9) 3.  No acute cardiopulmonary disease. 4. No acute abnormalities involving the abdomen or pelvis. 5. 6 mm cystic lesion involving the body of the pancreas. Recommend follow up pre and post contrast MRI/MRCP or pancreatic protocol CT in 2 years (and every 2 years thereafter for total of 10 years if stable). This recommendation follows ACR consensus guidelines: Management of Incidental Pancreatic Cysts: A White Paper of the ACR Incidental Findings Committee. [place] [id number];18:[id number]. 6. Borderline to mild hepatomegaly. No focal hepatic parenchymal abnormality. 7. Cholelithiasis without evidence of acute cholecystitis. 8. Small umbilical hernia containing fat Electronically Signed   By: Evangeline Dakin M.D.   On: 11/24/2016 13:46     Mercy Riding, MD 11/28/2016, 7:31  AM PGY-3, [place] Intern pager: [phone], text pages welcome

## 2016-11-28 NOTE — Progress Notes (Signed)
Notified provider of rash running along patient shoulder and that she is complaining of pain and itching along affected area.

## 2016-11-28 NOTE — Plan of Care (Signed)
Problem: Pain Managment: Goal: General experience of comfort will improve Outcome: Progressing Discussed with patient importance of managing pain. Tylenol was given and later heat packs were placed on pt shoulder.

## 2016-11-28 NOTE — Progress Notes (Signed)
Pt blood pressure increased to 172/79. On call provider paged and instructed to decreased fluids at this time. Will continue to monitor.

## 2016-11-29 ENCOUNTER — Other Ambulatory Visit: Payer: Self-pay

## 2016-11-29 LAB — RENAL FUNCTION PANEL
Albumin: 2.6 g/dL — ABNORMAL LOW (ref 3.5–5.0)
Anion gap: 11 (ref 5–15)
BUN: 27 mg/dL — ABNORMAL HIGH (ref 6–20)
CHLORIDE: 100 mmol/L — AB (ref 101–111)
CO2: 24 mmol/L (ref 22–32)
CREATININE: 1.76 mg/dL — AB (ref 0.44–1.00)
Calcium: 9.8 mg/dL (ref 8.9–10.3)
GFR calc non Af Amer: 29 mL/min — ABNORMAL LOW (ref >60)
GFR, EST AFRICAN AMERICAN: 34 mL/min — AB (ref >60)
Glucose, Bld: 215 mg/dL — ABNORMAL HIGH (ref 65–99)
Phosphorus: 4.2 mg/dL (ref 2.5–4.6)
Potassium: 3.5 mmol/L (ref 3.5–5.1)
Sodium: 135 mmol/L (ref 135–145)

## 2016-11-29 LAB — HEPATITIS B SURFACE ANTIBODY,QUALITATIVE: Hep B S Ab: NONREACTIVE

## 2016-11-29 LAB — CBC
HCT: 26.6 % — ABNORMAL LOW (ref 36.0–46.0)
HEMOGLOBIN: 8.9 g/dL — AB (ref 12.0–15.0)
MCH: 27.7 pg (ref 26.0–34.0)
MCHC: 33.5 g/dL (ref 30.0–36.0)
MCV: 82.9 fL (ref 78.0–100.0)
Platelets: 325 10*3/uL (ref 150–400)
RBC: 3.21 MIL/uL — ABNORMAL LOW (ref 3.87–5.11)
RDW: 13 % (ref 11.5–15.5)
WBC: 8.3 10*3/uL (ref 4.0–10.5)

## 2016-11-29 LAB — HEPATIC FUNCTION PANEL
ALBUMIN: 2.8 g/dL — AB (ref 3.5–5.0)
ALK PHOS: 69 U/L (ref 38–126)
ALT: 11 U/L — ABNORMAL LOW (ref 14–54)
AST: 16 U/L (ref 15–41)
Bilirubin, Direct: <0.1 mg/dL — ABNORMAL LOW (ref 0.1–0.5)
TOTAL PROTEIN: 6.1 g/dL — AB (ref 6.5–8.1)
Total Bilirubin: 0.4 mg/dL (ref 0.3–1.2)

## 2016-11-29 LAB — LIPASE, BLOOD: Lipase: 18 U/L (ref 11–51)

## 2016-11-29 LAB — HEPATITIS B SURFACE ANTIGEN: Hepatitis B Surface Ag: NEGATIVE

## 2016-11-29 LAB — GLUCOSE, CAPILLARY
GLUCOSE-CAPILLARY: 180 mg/dL — AB (ref 65–99)
GLUCOSE-CAPILLARY: 229 mg/dL — AB (ref 65–99)
GLUCOSE-CAPILLARY: 172 mg/dL — AB (ref 65–99)
Glucose-Capillary: 255 mg/dL — ABNORMAL HIGH (ref 65–99)
Glucose-Capillary: 242 mg/dL — ABNORMAL HIGH (ref 65–99)

## 2016-11-29 LAB — TROPONIN I: Troponin I: <0.03 ng/mL (ref <0.03)

## 2016-11-29 LAB — HEPATITIS C ANTIBODY (REFLEX): HCV Ab: <0.1 s/co ratio (ref 0.0–0.9)

## 2016-11-29 LAB — HCV COMMENT:

## 2016-11-29 LAB — PARATHYROID HORMONE, INTACT (NO CA): PTH: 21 pg/mL (ref 15–65)

## 2016-11-29 MED ORDER — VALACYCLOVIR HCL 500 MG PO TABS
1000.0000 mg | ORAL_TABLET | Freq: Every day | ORAL | Status: DC
  Administered 2016-11-30 – 2016-12-01 (×2): 1000 mg via ORAL
  Filled 2016-11-29 (×2): qty 2

## 2016-11-29 MED ORDER — CARVEDILOL 12.5 MG PO TABS
12.5000 mg | ORAL_TABLET | Freq: Two times a day (BID) | ORAL | Status: DC
  Administered 2016-11-29 – 2016-12-01 (×4): 12.5 mg via ORAL
  Filled 2016-11-29 (×4): qty 1

## 2016-11-29 MED ORDER — FUROSEMIDE 40 MG PO TABS
40.0000 mg | ORAL_TABLET | Freq: Two times a day (BID) | ORAL | Status: DC
  Administered 2016-11-29 – 2016-11-30 (×2): 40 mg via ORAL
  Filled 2016-11-29 (×2): qty 1

## 2016-11-29 NOTE — Progress Notes (Signed)
   11/29/16 0248  Vitals  Temp 99.4 F (37.4 C)  Temp Source Oral  BP 124/73  BP Location Left Arm  BP Method Automatic  Patient Position (if appropriate) Lying  Pulse Rate 73  Pulse Rate Source Monitor  ECG Heart Rate 76  Resp 20  Oxygen Therapy  SpO2 96 %  O2 Device Room Air  Pain Assessment  Pain Assessment No/denies pain    Pt. Transferred from [facility]. Pt is A&Ox4. Pt. was agitated from being transferred early in the morning. RN explained that we are trying to rule out shingles, hence the airborne precautions. Pt. And family agreeable and understanding. Pt and family oriented to the unit, room, call bell, heart monitor, airborne precautions, and fall precautions. CCMD applied and verified. No further needs assessed at this time. Will continue to monitor.

## 2016-11-29 NOTE — Progress Notes (Signed)
Subjective: Interval History: has complaints devel shinges.  Objective: Vital signs in last 24 hours: Temp:  [97.7 F (36.5 C)-99.4 F (37.4 C)] 99.4 F (37.4 C) (10/28 0248) Pulse Rate:  [73-86] 73 (10/28 0248) Resp:  [16-28] 20 (10/28 0248) BP: (124-194)/(73-158) 124/73 (10/28 0248) SpO2:  [96 %-100 %] 96 % (10/28 0248) Weight change:   Intake/Output from previous day: 10/27 0701 - 10/28 0700 In: 853.8 [P.O.:480; I.V.:373.8] Out: 1200 [Urine:1200] Intake/Output this shift: No intake/output data recorded.  General appearance: alert, cooperative, no distress and mildly obese Resp: diminished breath sounds bilaterally Cardio: S1, S2 normal and systolic murmur: systolic ejection 2/6, decrescendo at 2nd left intercostal space GI: soft, non-tender; bowel sounds normal; no masses,  no organomegaly Extremities: extremities normal, atraumatic, no cyanosis or edema Skin: papullar rash L t11-12  Lab Results:  Recent Labs  11/27/16 0229  WBC 8.6  HGB 9.0*  HCT 27.2*  PLT 320   BMET:  Recent Labs  11/28/16 0307 11/29/16 0355  NA 135 135  K 4.1 3.5  CL 103 100*  CO2 23 24  GLUCOSE 328* 215*  BUN 38* 27*  CREATININE 2.21* 1.76*  CALCIUM 9.1 9.8   No results for input(s): PTH in the last 72 hours. Iron Studies: No results for input(s): IRON, TIBC, TRANSFERRIN, FERRITIN in the last 72 hours.  Studies/Results: No results found.  I have reviewed the patient's current medications.  Assessment/Plan: 1 CKD 3-4 with AKI improving dye component.  U p/c pending, PTH pending 2 HTN improving with changes 3 DM controlled 4 CP 5 Shingles P await labs, cont meds    LOS: 4 days   Katelynd Blauvelt L 11/29/2016,11:17 AM

## 2016-11-29 NOTE — Plan of Care (Signed)
Problem: Education: Goal: Knowledge of [facility] Education information/materials will improve Outcome: Progressing Pt given verbal education regarding plan of care,    Problem: Safety: Goal: Ability to remain free from injury will improve Outcome: Progressing Pt compliant with use of call light to request assistance from staff.  Problem: Pain Managment: Goal: General experience of comfort will improve Outcome: Completed/Met Date Met: 11/29/16 Denies pain or discomfort  Problem: Bowel/Gastric: Goal: Will not experience complications related to bowel motility Outcome: Progressing Pt reported no BM since admission, prune juice given per pt request.

## 2016-11-29 NOTE — Progress Notes (Signed)
Family Medicine Teaching Service Daily Progress Note Intern Pager: [phone]  Patient name: Sara Davis Medical record number: [id number] Date of birth: February 16, 1950 Age: 66 y.o. Gender: female  Primary Care Provider: Smiley Houseman, MD Consultants: cardio Code Status: full  Pt Overview and Major Events to Date:  10/23-admitted with chest pain for ACS rule out 10/23-CT negative for AAA or dissection.  10/24-Echo basically normal.  10/27-New painful rash indicating shingles, patient moved to airborne precautions  Assessment and Plan: Sara Davis is a 66 y.o. female presenting with Chest pain beginning 10/22 . PMH is significant for uncontrolled T2DM, tobacco abuse, and HTN.   Chest pain: description of pain this morning and newly developing rash concerning for herpes zoster. Troponin negative 5. EKG with chronic ST depression. Per cardiology, not a good candidate for LHC due to acute renal dysfunction. Also not a good candidate for Lexiscan due to respiratory issue. However, patient seems to be doing well from resp stand point. Patient had chest pain overnight. EKG obtained and without significant change from prior.Talked to cardiology (Dr. Horace Porteous) again this morning. He suggested to stick with the outpatient Myoview.  - Nitroglycerin prn - Tylenol 650mg  q 6 hours - Zofran 4mg  q 6 prn - carvedilol 25 bid - Atorvastatin 20mg  - Asa 81mg   Skin rash: Likely herpes zoster.  -Start valacyclovir 1 g every 24 hr based on her estimated renal clearance of 24 ml/min -May need to adjust dosing daily based on renal clearance  Hypertension: hypertensive with SBP to 172 and DBP to 102 overnight.  IV fluids discontinued. Normotensive this morning.  - Continue 12.5mg  metoprolol bid - Amlodipine 10mg  daily - carvedilol  AKI: resovling: Serum creatinine to 1.76 from peak 2.21. Baseline 1.0-1.1.  AKI is multifactorial (contrast, iatrogenic, ?dehydration). Lisinopril and HCTZ discontinued.  FE urea 33% suggestive for prerenal etiology. Renal ultrasound with normal size kidney for age and normal echogenicity.  - daily bmp - avoid nephrotoxic meds -Lasix per nephro - Follow-up nephrology recommendations  T2DM, uncontrolled: A1c 10.5. Patient was unable to afford Lantus. She was also on metformin 1000 twice a day. Uses Novolin 70/30 20 units in a.m. and 10 units in p.m. she has been on Lantus 25 units and a sliding scale here. CBG consistently in 300s.  -Switched back to home Novolin 70/30 20 units twice a day - sSSI - monitor cbg  UTI: Patient with suprapubic tenderness on admission. UA with LE and bacteria.. Group b strep - start kelflex 250mg  q 8 hours 10/26> 8/1 (7 days) - Follow-up urine culture  Anemia of chronic disease: Hemoglobin at baseline, ~10. Iron panel w/n/l from 10/23. -Will monitor as needed   Tobacco abuse: patient reports 1/2 ppd smoking history. -nicotine patch -encourage smoking cessation   Constipation, subacute: Last bowel movement 10/23 after days of constipation -Scheduled MiraLAX 2, then when necessary  FEN/GI: heart healthy/carb modified diet   Prophylaxis: lovenox. Renally dosed  Disposition:  continue inpatient management pending improvement in AKI  Subjective:  No chest pain but back rash is still painful.   No dysuria and feels she is "peeing a lot".  Concerned about contact precautions but understood explanation.Would like to be home as soon as possible  Objective: Temp:  [97.7 F (36.5 C)-99.4 F (37.4 C)] 99.4 F (37.4 C) (10/28 0248) Pulse Rate:  [73-86] 73 (10/28 0248) Resp:  [16-28] 20 (10/28 0248) BP: (124-194)/(73-158) 124/73 (10/28 0248) SpO2:  [96 %-100 %] 96 % (10/28 0248) Physical Exam: General: awake  and alert, sitting in bed, NAD Cardiovascular: RRR. Respiratory: CTAB, no wheezes rales or rhonchi , no IWB Gastrointestinal: soft, non tender, non distended, bowel sounds normal  MSK: non tender, no  edema Derm: New spots of excoriated skin with surrounding skin erythema. No pus or blisters.  Neuro: cn 2-12 intact, no focal neuro deficits Psych: normal affect   Laboratory:  Recent Labs Lab 11/25/16 0325 11/26/16 0243 11/27/16 0229  WBC 9.1 13.4* 8.6  HGB 9.5* 9.6* 9.0*  HCT 29.3* 28.5* 27.2*  PLT 326 333 320    Recent Labs Lab 11/27/16 0229 11/28/16 0307 11/29/16 0355  NA 134* 135 135  K 3.3* 4.1 3.5  CL 99* 103 100*  CO2 26 23 24   BUN 35* 38* 27*  CREATININE 2.15* 2.21* 1.76*  CALCIUM 9.3 9.1 9.8  GLUCOSE 328* 328* 215*    Troponin <0.03 x3  Imaging/Diagnostic Tests: Dg Chest 2 View  Result Date: 11/24/2016 CLINICAL DATA:  Cough and chest pain EXAM: CHEST  2 VIEW COMPARISON:  None. FINDINGS: Lungs are clear. Heart size and pulmonary vascularity are normal. No adenopathy. There is degenerative change in the thoracic spine. There is aortic atherosclerosis. IMPRESSION: Aortic atherosclerosis.  No edema or consolidation. Aortic Atherosclerosis (ICD10-I70.0). Electronically Signed   By: Lowella Grip III M.D.   On: 11/24/2016 09:32   Ct Angio Chest/abd/pel For Dissection W And/or Wo Contrast  Result Date: 11/24/2016 CLINICAL DATA:  66 year old with four-day history of left-sided chest pain radiating into the upper back. Current history of type 2 diabetes and hypertension. Current smoker. EXAM: CT ANGIOGRAPHY CHEST, ABDOMEN AND PELVIS TECHNIQUE: Multidetector CT imaging through the chest, abdomen and pelvis was performed using the standard protocol during bolus administration of intravenous contrast. Multiplanar reconstructed images and MIPs were obtained and reviewed to evaluate the vascular anatomy. CONTRAST:  80 mL Isovue 370 IV. COMPARISON:  No prior CT.  Chest x-ray performed earlier same day. FINDINGS: CTA CHEST FINDINGS Cardiovascular: No evidence of mural hematoma on the unenhanced images. Mild calcified plaque involving the descending thoracic and visualized  upper abdominal aorta. No evidence of thoracic aortic aneurysm or dissection. Proximal great vessels widely patent with minimal atherosclerosis. Heart size normal with evidence of left ventricular hypertrophy. No visible coronary atherosclerosis. No pericardial effusion. Central pulmonary arteries widely patent. Mediastinum/Nodes: No pathologically enlarged mediastinal, hilar or axillary lymph nodes. No mediastinal masses. Normal-appearing esophagus. Thyroid gland normal in appearance. Lungs/Pleura: Mild emphysematous changes. Small bleb at the apex of the right lung. Mild right lower lobe bronchiectasis. Pulmonary parenchyma clear without localized airspace consolidation, interstitial disease, or parenchymal nodules or masses. Central airways patent without significant bronchial wall thickening. No pleural effusions. No pleural plaques or masses. Musculoskeletal: Degenerative changes involving the visualized lower cervical spine. Spondylosis involving mid and lower thoracic spine. Review of the MIP images confirms the above findings. CTA ABDOMEN AND PELVIS FINDINGS VASCULAR Aorta: Mild atherosclerosis. No evidence of abdominal aortic dissection or aneurysm. Celiac: Widely patent. SMA: Widely patent. Renals: Single renal arteries bilaterally, widely patent. IMA: Widely patent. Inflow: Mild bilateral common iliac artery atherosclerosis without evidence of stenosis. Bilateral external iliac arteries and bilateral common femoral arteries widely patent. Veins: Not evaluated. Review of the MIP images confirms the above findings. NON-VASCULAR Hepatobiliary: Liver upper normal in size to perhaps mildly enlarged. No focal hepatic parenchymal abnormality. Approximate 1.2 cm calcified cholesterol gallstone within the otherwise normal-appearing gallbladder. No biliary ductal dilation. Pancreas: Mild pancreatic atrophy. Approximate 6 mm cystic lesion in the body of the  pancreas (series 6, image 158). No other focal pancreatic  parenchymal abnormality. Spleen: Normal allowing for the early arterial phase of enhancement. Adrenals/Urinary Tract: Mild bilateral adrenal enlargement without nodularity. Normal-appearing kidneys allowing for the early arterial phase of enhancement. No hydronephrosis. No visible urinary tract calculi. Urinary bladder normal in appearance. Stomach/Bowel: Stomach normal in appearance for the degree of distention. Normal-appearing small bowel. Normal appearing colon with expected colonic stool burden. Normal appearing long appendix in the right mid and lower pelvis. Lymphatic: No pathologic lymphadenopathy. Reproductive: Surgically absent uterus.  No adnexal masses. Other: Small umbilical hernia containing fat. Musculoskeletal: Facet degenerative changes at L4-5 and L5-S1. Mild degenerative disc disease at L4-5 and L5-S1. Review of the MIP images confirms the above findings. IMPRESSION: 1. No evidence of thoracic or abdominal aortic aneurysm or dissection. Mild aortic atherosclerosis (Aortic Atherosclerosis (ICD10-170.0)). 2.  Emphysema. (ICD10-J43.9) 3.  No acute cardiopulmonary disease. 4. No acute abnormalities involving the abdomen or pelvis. 5. 6 mm cystic lesion involving the body of the pancreas. Recommend follow up pre and post contrast MRI/MRCP or pancreatic protocol CT in 2 years (and every 2 years thereafter for total of 10 years if stable). This recommendation follows ACR consensus guidelines: Management of Incidental Pancreatic Cysts: A White Paper of the ACR Incidental Findings Committee. Echo 1898;42:103-128. 6. Borderline to mild hepatomegaly. No focal hepatic parenchymal abnormality. 7. Cholelithiasis without evidence of acute cholecystitis. 8. Small umbilical hernia containing fat Electronically Signed   By: Evangeline Dakin M.D.   On: 11/24/2016 13:46     Sherene Sires, DO 11/29/2016, 6:43 AM PGY-3, [place] Intern pager: [phone], text pages welcome

## 2016-11-29 NOTE — Progress Notes (Signed)
PT Cancellation Note  Patient Details Name: HELON WISINSKI MRN: [id number] DOB: 1950-11-13   Cancelled Treatment:    Reason Eval/Treat Not Completed: Other (comment)   Politely declining PT today; Reports is tired;   Plan to follow up tomorrow;   Roney Marion, [place] Pager [phone] Office [phone]    [place]letta [place] 11/29/2016, 5:07 PM

## 2016-11-30 LAB — RENAL FUNCTION PANEL
ANION GAP: 8 (ref 5–15)
Albumin: 2.6 g/dL — ABNORMAL LOW (ref 3.5–5.0)
BUN: 34 mg/dL — ABNORMAL HIGH (ref 6–20)
CALCIUM: 9.4 mg/dL (ref 8.9–10.3)
CO2: 27 mmol/L (ref 22–32)
Chloride: 101 mmol/L (ref 101–111)
Creatinine, Ser: 2.57 mg/dL — ABNORMAL HIGH (ref 0.44–1.00)
GFR calc non Af Amer: 18 mL/min — ABNORMAL LOW (ref >60)
GFR, EST AFRICAN AMERICAN: 21 mL/min — AB (ref >60)
GLUCOSE: 145 mg/dL — AB (ref 65–99)
PHOSPHORUS: 4.9 mg/dL — AB (ref 2.5–4.6)
POTASSIUM: 3.8 mmol/L (ref 3.5–5.1)
SODIUM: 136 mmol/L (ref 135–145)

## 2016-11-30 LAB — PROTEIN ELECTROPHORESIS, SERUM
A/G RATIO SPE: 0.9 (ref 0.7–1.7)
ALBUMIN ELP: 2.6 g/dL — AB (ref 2.9–4.4)
ALPHA-2-GLOBULIN: 1.2 g/dL — AB (ref 0.4–1.0)
Alpha-1-Globulin: 0.3 g/dL (ref 0.0–0.4)
BETA GLOBULIN: 1 g/dL (ref 0.7–1.3)
GLOBULIN, TOTAL: 2.9 g/dL (ref 2.2–3.9)
Gamma Globulin: 0.4 g/dL (ref 0.4–1.8)
TOTAL PROTEIN ELP: 5.5 g/dL — AB (ref 6.0–8.5)

## 2016-11-30 LAB — PROTEIN / CREATININE RATIO, URINE
CREATININE, URINE: 130.48 mg/dL
Protein Creatinine Ratio: 2.01 mg/mg{Cre} — ABNORMAL HIGH (ref 0.00–0.15)
TOTAL PROTEIN, URINE: 262 mg/dL

## 2016-11-30 LAB — SODIUM, URINE, RANDOM: Sodium, Ur: 45 mmol/L

## 2016-11-30 LAB — GLUCOSE, CAPILLARY
GLUCOSE-CAPILLARY: 89 mg/dL (ref 65–99)
GLUCOSE-CAPILLARY: 73 mg/dL (ref 65–99)
Glucose-Capillary: 174 mg/dL — ABNORMAL HIGH (ref 65–99)
Glucose-Capillary: 207 mg/dL — ABNORMAL HIGH (ref 65–99)

## 2016-11-30 LAB — CREATININE, URINE, RANDOM: CREATININE, URINE: 130.5 mg/dL

## 2016-11-30 LAB — TROPONIN I: Troponin I: <0.03 ng/mL (ref <0.03)

## 2016-11-30 NOTE — Progress Notes (Signed)
  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  UP/C 2 g.  Likely due to longstanding DM and HTN.  Agree with holding Lasix 2 HTN improving with changes 3 DM controlled 4 CP 5 Shingles- renal dosing appropriate for Valtrex.   Subjective:    Getting treated for shingles.   Objective:   BP 121/66 (BP Location: Right Arm)   Pulse 78   Temp (!) 97.5 F (36.4 C) (Axillary)   Resp 16   Ht 5\' 6"  (1.676 m)   Wt 67.2 kg (148 lb 3.2 oz)   SpO2 100%   BMI 23.92 kg/m   Intake/Output Summary (Last 24 hours) at 11/30/16 1835 Last data filed at 11/30/16 1230  Gross per 24 hour  Intake              720 ml  Output                0 ml  Net              720 ml   Weight change:   Physical Exam: General appearance: alert, cooperative, NAD Resp: clear bilaterally Cardio: RRR GI: soft, non-tender; bowel sounds normal; no masses,  no organomegaly Extremities: extremities normal, atraumatic, no cyanosis or edema Skin: vesicular rash L t11-12  Imaging: No results found.  Labs: BMET  Recent Labs Lab 11/24/16 0906 11/24/16 2016 11/25/16 0325 11/26/16 0243 11/27/16 0229 11/28/16 0307 11/29/16 0355 11/30/16 0128  NA 138  --  138 136 134* 135 135 136  K 3.7  --  3.5 3.6 3.3* 4.1 3.5 3.8  CL 106  --  103 104 99* 103 100* 101  CO2 18*  --  25 24 26 23 24 27   GLUCOSE 355*  --  291* 250* 328* 328* 215* 145*  BUN 26*  --  31* 33* 35* 38* 27* 34*  CREATININE 1.51* 1.83* 1.90* 1.90* 2.15* 2.21* 1.76* 2.57*  CALCIUM 9.6  --  9.4 9.4 9.3 9.1 9.8 9.4  PHOS  --   --   --   --   --   --  4.2 4.9*   CBC  Recent Labs Lab 11/25/16 0325 11/26/16 0243 11/27/16 0229 11/29/16 1305  WBC 9.1 13.4* 8.6 8.3  HGB 9.5* 9.6* 9.0* 8.9*  HCT 29.3* 28.5* 27.2* 26.6*  MCV 83.5 83.1 83.2 82.9  PLT 326 333 320 325    Medications:    . amLODipine  10 mg Oral Daily  . aspirin EC  81 mg Oral Daily  . atorvastatin  20 mg Oral q1800   . carvedilol  12.5 mg Oral BID WC  . cephALEXin  250 mg Oral Q8H  . enoxaparin (LOVENOX) injection  30 mg Subcutaneous Q24H  . insulin aspart  0-9 Units Subcutaneous TID WC  . insulin aspart protamine- aspart  20 Units Subcutaneous BID WC  . multivitamin with minerals  1 tablet Oral Daily  . protein supplement shake  2 oz Oral BID BM  . valACYclovir  1,000 mg Oral Daily      Madelon Lips MD [facility] pgr [phone] 11/30/2016, 6:35 PM

## 2016-11-30 NOTE — Progress Notes (Signed)
Physical Therapy Treatment Patient Details Name: Sara Davis MRN: [id number] DOB: 05/06/1950 Today's Date: 11/30/2016    History of Present Illness 66 Y/O F with PMX of DM2, HTN, Depression presenting with 1-week hx of intermittent left sided chest pain which got worse last night. Pain is sharp in nature radiating to her left shoulder. Pain is associated with diaphoresis, nausea without vomiting and SOB. Dx with HTN, AKI, uncontrolled DM2.    PT Comments    Patient agreeable to LE therex and tolerated well. Pt does demonstrate decreased activity tolerance and strength and will continue to benefit from further skilled PT services in acute setting to maximize independence and safety with mobility.    Follow Up Recommendations  No PT follow up     Equipment Recommendations  None recommended by PT    Recommendations for Other Services       Precautions / Restrictions      Mobility  Bed Mobility Overal bed mobility: Independent                Transfers Overall transfer level: Independent Equipment used: None Transfers: Sit to/from Stand              Ambulation/Gait                 Stairs            Wheelchair Mobility    Modified Rankin (Stroke Patients Only)       Balance Overall balance assessment: Needs assistance Sitting-balance support: Feet supported;No upper extremity supported Sitting balance-Leahy Scale: Normal     Standing balance support: No upper extremity supported;During functional activity Standing balance-Leahy Scale: Good                              Cognition Arousal/Alertness: Awake/alert Behavior During Therapy: WFL for tasks assessed/performed Overall Cognitive Status: Within Functional Limits for tasks assessed                                        Exercises General Exercises - Lower Extremity Long Arc Quad: AROM;Both;20 reps Heel Slides: AROM;Both;20 reps Hip  ABduction/ADduction: AROM;Both;20 reps Straight Leg Raises: AROM;Both;10 reps Hip Flexion/Marching: AROM;Standing Mini-Sqauts: AROM;10 reps;Standing    General Comments        Pertinent Vitals/Pain Pain Assessment: No/denies pain    Home Living                      Prior Function            PT Goals (current goals can now be found in the care plan section) Acute Rehab PT Goals Patient Stated Goal: go home PT Goal Formulation: With patient Time For Goal Achievement: 12/04/16 Potential to Achieve Goals: Good Progress towards PT goals: Progressing toward goals    Frequency    Min 3X/week      PT Plan Current plan remains appropriate    Co-evaluation              AM-PAC PT "6 Clicks" Daily Activity  Outcome Measure  Difficulty turning over in bed (including adjusting bedclothes, sheets and blankets)?: None Difficulty moving from lying on back to sitting on the side of the bed? : None Difficulty sitting down on and standing up from a chair with arms (e.g., wheelchair, bedside commode, etc,.)?: None Help  needed moving to and from a bed to chair (including a wheelchair)?: None Help needed walking in hospital room?: None Help needed climbing 3-5 steps with a railing? : A Little 6 Click Score: 23    End of Session Equipment Utilized During Treatment: Gait belt Activity Tolerance: Patient tolerated treatment well Patient left: in bed;with call bell/phone within reach (pt sitting EOB) Nurse Communication: Mobility status PT Visit Diagnosis: Other abnormalities of gait and mobility (R26.89);Muscle weakness (generalized) (M62.81)     Time: 3818-2993 PT Time Calculation (min) (ACUTE ONLY): 22 min  Charges:  $Therapeutic Exercise: 8-22 mins                    G Codes:       Earney Navy, PTA Pager: [phone]     Darliss Cheney 11/30/2016, 4:46 PM

## 2016-11-30 NOTE — Progress Notes (Signed)
Family Medicine Teaching Service Daily Progress Note Intern Pager: [phone]  Patient name: Sara Davis Medical record number: [id number] Date of birth: 05-09-1950 Age: 66 y.o. Gender: female  Primary Care Provider: Smiley Houseman, MD Consultants: cardio Code Status: full  Pt Overview and Major Events to Date:  10/23-admitted with chest pain for ACS rule out 10/23-CT negative for AAA or dissection.  10/24-Echo basically normal.  10/27-New painful rash indicating shingles, patient moved to airborne precautions  Assessment and Plan: KYLEAH PENSABENE is a 66 y.o. female presenting with Chest pain beginning 10/22 . PMH is significant for uncontrolled T2DM, tobacco abuse, and HTN.   Chest pain: now described as distributed along newly developing rash concerning for herpes zoster. Troponin negative 5. EKG with chronic ST depression. Per cardiology, not a good candidate for LHC due to acute renal dysfunction. Also not a good candidate for Lexiscan due to respiratory issue. However, patient seems to be doing well from resp stand point. Patient had chest pain overnight. EKG obtained and without significant change from prior.Talked to cardiology (Dr. Horace Porteous) again this morning. He suggested to stick with the outpatient Myoview.  - Nitroglycerin prn - Tylenol 650mg  q 6 hours - Zofran 4mg  q 6 prn - carvedilol 12.5 bid - Atorvastatin 20mg  - Asa 81mg   Skin rash: Likely herpes zoster.   -valacyclovir (started 10/28>) (7 day course) 1 g every 24 hr based on her estimated renal clearance of 20 ml/min -May need to adjust dosing daily based on renal clearance  Hypertension:normotensive ON.  - Amlodipine 10mg  daily - carvedilol 12.5  AKI: worse today 2.57 Prior peak 2.21. Baseline 1.0-1.1.  AKI is multifactorial (contrast, iatrogenic, ?dehydration). Lisinopril and HCTZ discontinued. FE urea 33% suggestive for prerenal etiology. Renal ultrasound with normal size kidney for age and normal  echogenicity.  - daily bmp - avoid nephrotoxic meds -Lasix per nephro at 40mg  BID - Follow-up nephrology recommendations  T2DM, uncontrolled: A1c 10.5. Patient was unable to afford Lantus. She was also on metformin 1000 twice a day. Uses Novolin 70/30 20 units in a.m. and 10 units in p.m. she has been on Lantus 25 units and a sliding scale here. CBG consistently in 300s.  -Switched back to home Novolin 70/30 20 units twice a day - sSSI - monitor cbg  UTI: Patient with suprapubic tenderness on admission. UA with LE and bacteria.. Group b strep - start kelflex 250mg  q 8 hours 10/26> 8/1 (7 days) - Follow-up urine culture  Anemia of chronic disease: Hemoglobin at baseline, ~10. Iron panel w/n/l from 10/23. -Will monitor as needed   Tobacco abuse: patient reports 1/2 ppd smoking history. -nicotine patch -encourage smoking cessation   Constipation, subacute: Last bowel movement 10/23 after days of constipation -Scheduled MiraLAX 2, then when necessary  FEN/GI: heart healthy/carb modified diet   Prophylaxis: lovenox. Renally dosed  Disposition:  continue inpatient management pending improvement in AKI  Subjective:  Zoster pain is well controlled but tender, no anterior chest pain and no other complaints other than "can you get me out of here soon".  We discussed concern for kidney function and she was ameniable to staying.  Objective: Temp:  [98.7 F (37.1 C)-99.6 F (37.6 C)] 98.7 F (37.1 C) (10/29 0527) Pulse Rate:  [74-82] 80 (10/28 1946) Resp:  [16-17] 16 (10/29 0527) BP: (99-124)/(66-78) 121/66 (10/29 0527) SpO2:  [98 %] 98 % (10/29 0527) Weight:  [148 lb 3.2 oz (67.2 kg)] 148 lb 3.2 oz (67.2 kg) (10/29 0527) Physical Exam:  General: awake and alert, sitting in bed, NAD Cardiovascular: RRR. Respiratory: CTAB, no wheezes rales or rhonchi , no IWB Gastrointestinal: soft, non tender, non distended, bowel sounds normal  MSK: non tender, no edema Derm: New spots of  excoriated skin with surrounding skin erythema along dermoatome mid thoracic on Left side. No pus or blisters.  Neuro: cn 2-12 intact, no focal neuro deficits Psych: normal affect   Laboratory:  Recent Labs Lab 11/26/16 0243 11/27/16 0229 11/29/16 1305  WBC 13.4* 8.6 8.3  HGB 9.6* 9.0* 8.9*  HCT 28.5* 27.2* 26.6*  PLT 333 320 325    Recent Labs Lab 11/28/16 0307 11/29/16 0355 11/29/16 1305 11/30/16 0128  NA 135 135  --  136  K 4.1 3.5  --  3.8  CL 103 100*  --  101  CO2 23 24  --  27  BUN 38* 27*  --  34*  CREATININE 2.21* 1.76*  --  2.57*  CALCIUM 9.1 9.8  --  9.4  PROT  --   --  6.1*  --   BILITOT  --   --  0.4  --   ALKPHOS  --   --  69  --   ALT  --   --  11*  --   AST  --   --  16  --   GLUCOSE 328* 215*  --  145*    Troponin <0.03 x3  Imaging/Diagnostic Tests: Dg Chest 2 View  Result Date: 11/24/2016 CLINICAL DATA:  Cough and chest pain EXAM: CHEST  2 VIEW COMPARISON:  None. FINDINGS: Lungs are clear. Heart size and pulmonary vascularity are normal. No adenopathy. There is degenerative change in the thoracic spine. There is aortic atherosclerosis. IMPRESSION: Aortic atherosclerosis.  No edema or consolidation. Aortic Atherosclerosis (ICD10-I70.0). Electronically Signed   By: Lowella Grip III M.D.   On: 11/24/2016 09:32   Ct Angio Chest/abd/pel For Dissection W And/or Wo Contrast  Result Date: 11/24/2016 CLINICAL DATA:  66 year old with four-day history of left-sided chest pain radiating into the upper back. Current history of type 2 diabetes and hypertension. Current smoker. EXAM: CT ANGIOGRAPHY CHEST, ABDOMEN AND PELVIS TECHNIQUE: Multidetector CT imaging through the chest, abdomen and pelvis was performed using the standard protocol during bolus administration of intravenous contrast. Multiplanar reconstructed images and MIPs were obtained and reviewed to evaluate the vascular anatomy. CONTRAST:  80 mL Isovue 370 IV. COMPARISON:  No prior CT.  Chest x-ray  performed earlier same day. FINDINGS: CTA CHEST FINDINGS Cardiovascular: No evidence of mural hematoma on the unenhanced images. Mild calcified plaque involving the descending thoracic and visualized upper abdominal aorta. No evidence of thoracic aortic aneurysm or dissection. Proximal great vessels widely patent with minimal atherosclerosis. Heart size normal with evidence of left ventricular hypertrophy. No visible coronary atherosclerosis. No pericardial effusion. Central pulmonary arteries widely patent. Mediastinum/Nodes: No pathologically enlarged mediastinal, hilar or axillary lymph nodes. No mediastinal masses. Normal-appearing esophagus. Thyroid gland normal in appearance. Lungs/Pleura: Mild emphysematous changes. Small bleb at the apex of the right lung. Mild right lower lobe bronchiectasis. Pulmonary parenchyma clear without localized airspace consolidation, interstitial disease, or parenchymal nodules or masses. Central airways patent without significant bronchial wall thickening. No pleural effusions. No pleural plaques or masses. Musculoskeletal: Degenerative changes involving the visualized lower cervical spine. Spondylosis involving mid and lower thoracic spine. Review of the MIP images confirms the above findings. CTA ABDOMEN AND PELVIS FINDINGS VASCULAR Aorta: Mild atherosclerosis. No evidence of abdominal aortic dissection or aneurysm.  Celiac: Widely patent. SMA: Widely patent. Renals: Single renal arteries bilaterally, widely patent. IMA: Widely patent. Inflow: Mild bilateral common iliac artery atherosclerosis without evidence of stenosis. Bilateral external iliac arteries and bilateral common femoral arteries widely patent. Veins: Not evaluated. Review of the MIP images confirms the above findings. NON-VASCULAR Hepatobiliary: Liver upper normal in size to perhaps mildly enlarged. No focal hepatic parenchymal abnormality. Approximate 1.2 cm calcified cholesterol gallstone within the otherwise  normal-appearing gallbladder. No biliary ductal dilation. Pancreas: Mild pancreatic atrophy. Approximate 6 mm cystic lesion in the body of the pancreas (series 6, image 158). No other focal pancreatic parenchymal abnormality. Spleen: Normal allowing for the early arterial phase of enhancement. Adrenals/Urinary Tract: Mild bilateral adrenal enlargement without nodularity. Normal-appearing kidneys allowing for the early arterial phase of enhancement. No hydronephrosis. No visible urinary tract calculi. Urinary bladder normal in appearance. Stomach/Bowel: Stomach normal in appearance for the degree of distention. Normal-appearing small bowel. Normal appearing colon with expected colonic stool burden. Normal appearing long appendix in the right mid and lower pelvis. Lymphatic: No pathologic lymphadenopathy. Reproductive: Surgically absent uterus.  No adnexal masses. Other: Small umbilical hernia containing fat. Musculoskeletal: Facet degenerative changes at L4-5 and L5-S1. Mild degenerative disc disease at L4-5 and L5-S1. Review of the MIP images confirms the above findings. IMPRESSION: 1. No evidence of thoracic or abdominal aortic aneurysm or dissection. Mild aortic atherosclerosis (Aortic Atherosclerosis (ICD10-170.0)). 2.  Emphysema. (ICD10-J43.9) 3.  No acute cardiopulmonary disease. 4. No acute abnormalities involving the abdomen or pelvis. 5. 6 mm cystic lesion involving the body of the pancreas. Recommend follow up pre and post contrast MRI/MRCP or pancreatic protocol CT in 2 years (and every 2 years thereafter for total of 10 years if stable). This recommendation follows ACR consensus guidelines: Management of Incidental Pancreatic Cysts: A White Paper of the ACR Incidental Findings Committee. [place] [id number];[id number]. 6. Borderline to mild hepatomegaly. No focal hepatic parenchymal abnormality. 7. Cholelithiasis without evidence of acute cholecystitis. 8. Small umbilical hernia containing fat  Electronically Signed   By: Evangeline Dakin M.D.   On: 11/24/2016 13:46     Sherene Sires, DO 11/30/2016, 7:19 AM PGY-3, [place] Intern pager: [phone], text pages welcome

## 2016-11-30 NOTE — Plan of Care (Signed)
Problem: Safety: Goal: Ability to remain free from injury will improve Outcome: Progressing Compliant with use of call light to request assistance  Problem: Physical Regulation: Goal: Ability to maintain clinical measurements within normal limits will improve Outcome: Progressing Improved lab values Goal: Will remain free from infection Outcome: Progressing Pt receives IV ABX

## 2016-11-30 NOTE — Progress Notes (Signed)
No urinary OP this shift, patient stated very little OP last night, Creatinine elevated, MD notified, new orders acknowledged.  Edward Qualia RN

## 2016-12-01 DIAGNOSIS — Z23 Encounter for immunization: Secondary | ICD-10-CM | POA: Diagnosis not present

## 2016-12-01 LAB — RENAL FUNCTION PANEL
Albumin: 2.9 g/dL — ABNORMAL LOW (ref 3.5–5.0)
Anion gap: 12 (ref 5–15)
BUN: 37 mg/dL — AB (ref 6–20)
CHLORIDE: 104 mmol/L (ref 101–111)
CO2: 22 mmol/L (ref 22–32)
Calcium: 9.5 mg/dL (ref 8.9–10.3)
Creatinine, Ser: 2.48 mg/dL — ABNORMAL HIGH (ref 0.44–1.00)
GFR calc Af Amer: 22 mL/min — ABNORMAL LOW (ref >60)
GFR, EST NON AFRICAN AMERICAN: 19 mL/min — AB (ref >60)
GLUCOSE: 176 mg/dL — AB (ref 65–99)
POTASSIUM: 4.1 mmol/L (ref 3.5–5.1)
Phosphorus: 5.1 mg/dL — ABNORMAL HIGH (ref 2.5–4.6)
Sodium: 138 mmol/L (ref 135–145)

## 2016-12-01 LAB — BASIC METABOLIC PANEL
Anion gap: 12 (ref 5–15)
BUN: 37 mg/dL — ABNORMAL HIGH (ref 6–20)
CALCIUM: 9.4 mg/dL (ref 8.9–10.3)
CHLORIDE: 104 mmol/L (ref 101–111)
CO2: 21 mmol/L — AB (ref 22–32)
CREATININE: 2.5 mg/dL — AB (ref 0.44–1.00)
GFR calc Af Amer: 22 mL/min — ABNORMAL LOW (ref >60)
GFR, EST NON AFRICAN AMERICAN: 19 mL/min — AB (ref >60)
Glucose, Bld: 177 mg/dL — ABNORMAL HIGH (ref 65–99)
POTASSIUM: 4.1 mmol/L (ref 3.5–5.1)
SODIUM: 137 mmol/L (ref 135–145)

## 2016-12-01 LAB — GLUCOSE, CAPILLARY
GLUCOSE-CAPILLARY: 209 mg/dL — AB (ref 65–99)
Glucose-Capillary: 190 mg/dL — ABNORMAL HIGH (ref 65–99)

## 2016-12-01 MED ORDER — INSULIN NPH ISOPHANE & REGULAR (70-30) 100 UNIT/ML ~~LOC~~ SUSP: 20.0000 [IU] | Freq: Two times a day (BID) | SUBCUTANEOUS | 3 refills | Status: DC

## 2016-12-01 MED ORDER — VALACYCLOVIR HCL 1 G PO TABS: 1000.0000 mg | ORAL_TABLET | Freq: Every day | ORAL | 0 refills | Status: AC

## 2016-12-01 MED ORDER — ATORVASTATIN CALCIUM 20 MG PO TABS: 20.0000 mg | ORAL_TABLET | Freq: Every day | ORAL | 0 refills | Status: DC

## 2016-12-01 MED ORDER — ASPIRIN 81 MG PO TBEC: 81.0000 mg | DELAYED_RELEASE_TABLET | Freq: Every day | ORAL | 0 refills | Status: AC

## 2016-12-01 MED ORDER — CEPHALEXIN 250 MG PO CAPS: 250.0000 mg | ORAL_CAPSULE | Freq: Three times a day (TID) | ORAL | 0 refills | Status: AC

## 2016-12-01 MED ORDER — AMLODIPINE BESYLATE 10 MG PO TABS: 10.0000 mg | ORAL_TABLET | Freq: Every day | ORAL | 0 refills | Status: DC

## 2016-12-01 MED ORDER — PREMIER PROTEIN SHAKE: 2.0000 [oz_av] | Freq: Two times a day (BID) | ORAL | 0 refills | Status: AC

## 2016-12-01 MED ORDER — CARVEDILOL 12.5 MG PO TABS: 12.5000 mg | ORAL_TABLET | Freq: Two times a day (BID) | ORAL | 0 refills | Status: DC

## 2016-12-01 MED ORDER — ADULT MULTIVITAMIN W/MINERALS CH: 1.0000 | ORAL_TABLET | Freq: Every day | ORAL | 0 refills | Status: AC

## 2016-12-01 NOTE — Progress Notes (Signed)
Patient received discharge instructions with husband at bedside. Patient has been started on new medications and received education. Verbalized understanding. Patient and husband had no further questions. PIV removed. Patient aware of follow-up appointments with primary PCP and nephrologist. Escorted out by nurse tech via wheelchair.

## 2016-12-01 NOTE — Progress Notes (Signed)
  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  UP/C 2 g.  Likely due to longstanding DM and HTN.  Agree with holding Lasix 2 HTN improving with changes- coreg 12.5 mg BID, amlodipine 10 mg daily.  Holding diuretic for now 3 DM controlled 4 CP 5 Shingles- renal dosing appropriate for Valtrex. 6.  Dispo: I see she's going home today.  She has f/u with me 12/16/16 at 1:30 pm  Subjective:    For d/c today.   Objective:   BP 128/72 (BP Location: Right Arm)   Pulse 68   Temp 98 F (36.7 C) (Oral)   Resp 17   Ht 5\' 6"  (1.676 m)   Wt 67 kg (147 lb 9.6 oz)   SpO2 100%   BMI 23.82 kg/m   Intake/Output Summary (Last 24 hours) at 12/01/16 1609 Last data filed at 12/01/16 1000  Gross per 24 hour  Intake              120 ml  Output                1 ml  Net              119 ml   Weight change: -0.272 kg (-9.6 oz)  Physical Exam: General appearance: alert, cooperative, NAD Resp: clear bilaterally Cardio: RRR GI: soft, non-tender; bowel sounds normal; no masses,  no organomegaly Extremities: extremities normal, atraumatic, no cyanosis or edema Skin: vesicular rash L t11-12  Imaging: No results found.  Labs: BMET  Recent Labs Lab 11/25/16 0325 11/26/16 0243 11/27/16 0229 11/28/16 0307 11/29/16 0355 11/30/16 0128 12/01/16 0657  NA 138 136 134* 135 135 136 138  137  K 3.5 3.6 3.3* 4.1 3.5 3.8 4.1  4.1  CL 103 104 99* 103 100* 101 104  104  CO2 25 24 26 23 24 27 22   21*  GLUCOSE 291* 250* 328* 328* 215* 145* 176*  177*  BUN 31* 33* 35* 38* 27* 34* 37*  37*  CREATININE 1.90* 1.90* 2.15* 2.21* 1.76* 2.57* 2.48*  2.50*  CALCIUM 9.4 9.4 9.3 9.1 9.8 9.4 9.5  9.4  PHOS  --   --   --   --  4.2 4.9* 5.1*   CBC  Recent Labs Lab 11/25/16 0325 11/26/16 0243 11/27/16 0229 11/29/16 1305  WBC 9.1 13.4* 8.6 8.3  HGB 9.5* 9.6* 9.0* 8.9*  HCT 29.3* 28.5* 27.2* 26.6*  MCV 83.5 83.1 83.2 82.9  PLT 326  333 320 325    Medications:    . amLODipine  10 mg Oral Daily  . aspirin EC  81 mg Oral Daily  . atorvastatin  20 mg Oral q1800  . carvedilol  12.5 mg Oral BID WC  . cephALEXin  250 mg Oral Q8H  . enoxaparin (LOVENOX) injection  30 mg Subcutaneous Q24H  . insulin aspart  0-9 Units Subcutaneous TID WC  . insulin aspart protamine- aspart  20 Units Subcutaneous BID WC  . multivitamin with minerals  1 tablet Oral Daily  . protein supplement shake  2 oz Oral BID BM  . valACYclovir  1,000 mg Oral Daily      Madelon Lips MD [facility] pgr [phone] 12/01/2016, 4:09 PM

## 2016-12-01 NOTE — Progress Notes (Signed)
[facility] Phone: [phone]   Date of Visit: 12/03/2016   HPI:  Hospital Follow Up: Summary: - Chest Pain: patient admitted on 11/24/16 to 12/01/16 for chest pain.  Per admission note EKG on admission showed ST depressions in lead II, III, aVF, V5, and V6.  Additionally patient had uncontrolled blood pressures due to noncompliance with medications.  She had a normal CTA chest.  Troponin was negative x5.  Her EKG findings were thought to be chronic ST depressions.  Per cardiology she was not a good candidate for left heart catheterization due to acute renal dysfunction also not a good candidate for Lexiscan due to respiratory issue.  Symptoms are not thought to be cardiac related however due to her risk factors cardiology recommended outpatient Myoview..  She developed a skin rash on her chest and this was thought to contribute to her chest pain.  Clinically it was thought to be consistent with herpes zoster.  She was started on valacyclovir on 10/28 for 7-day course.  However her varicella-zoster PCR was negative. Additionally her chest x-ray was negative.  Echo with normal EF and grade 1 diastolic dysfunction -AKI on CKD 3-4: CKD likely due to long-standing diabetes and hypertension.  She was also found to have a AK I with creatinine of 1.51 admit; her baseline is around 1-1.1.  Her creatinine peaked at 2.57.  Her AK I thought to be multifactorial including decreased p.o. intake, contrast, iatrogenic.  On discharge creatinine was 2.48.  lisinopril and HCTZ were discontinued.  If the urea suggested prerenal etiology.  Renal ultrasound unremarkable.  Patient to follow-up with nephrology Dr. Hollie Salk; patient was to call nephrology clinic -UTI: She was also diagnosed with a UTI.  Urine cultures show group B strep.  She was started on Keflex 250 every 8 hours.  This was started 10/26 for 7 days.  -Uncontrolled type 2 diabetes: Her A1c during hospital stay was 10.5 from 12.1 in  February.  -She had an incidental finding of a pancreatic cyst on imaging.Recommend follow up pre and post contrast MRI/MRCP or pancreatic protocol CT in 2 years (and every 2 years thereafter for total of 10 years if stable).  -Anemia: Patient was found to have hemoglobin of 10.3 on admission baseline appears to be around this.  Hemoglobin on 10/28 was 8.9.  Today:  - Patient presents for follow-up today.  She reports she is feeling better since discharge.  However she reports she does not have any of her medications yet because she has not picked them up yet. -She reports that the pharmacy called her to let her know that her Novolin 70/30 would be over $100.  She asks if there is another type of insulin that would be less expensive for her.  For her diabetes she has only been taking metformin since she was discharged.  She has been checking her sugars twice a day.  Her morning fasting is about 160 she reports her evening before bedtime sugar is about 200.  No low sugars lowest CBG is between 130-140.  She does eat 3 meals a day.  -She still has some chest pain on her left chest wall which is mainly with movement.  Her rash is still present and it is itchy.  As mentioned above she has not started the remainder of her antiviral treatment for presumed herpes zoster because she has not picked up this medication yet.  She has not yet heard from the cardiologist regarding her outpatient stress  test.  Per chart review there is a note reporting that clinic will contact her regarding this. -Her symptoms of her UTI have resolved.  As noted above she has not picked up her last few days of Keflex from the pharmacy yet.  She feels like she is urinating fine.  She has set up an appointment with Dr. Hollie Salk later on this month. -She reports she has some general weakness since she has been discharged from the hospital.  But no lightheadedness, no blood in her stool, no blood in her urine.      ROS: See HPI.  [place]:    PMH: HTN DM2 Tobacco Use   PHYSICAL EXAM: BP 140/80   Pulse 89   Temp 98.1 F (36.7 C)   Wt 153 lb (69.4 kg)   SpO2 99%   BMI 24.69 kg/m  GEN: NAD, pleasant, well-appearing CV: RRR, no murmurs, rubs, or gallops CHEST: Vesicular rash following a dermatome under her left breast to her back.  She is tender over this area. PULM: CTAB, normal effort SKIN: No rash or cyanosis; warm and well-perfused EXTR: No lower extremity edema or calf tenderness PSYCH: Mood and affect euthymic, normal rate and volume of speech NEURO: Awake, alert, no focal deficits grossly, normal speech   ASSESSMENT/PLAN:  Chest Pain:  Chest pain likely due to herpes zoster: Although herpes zoster PCR obtained in the hospital was negative this clinically is consistent with herpes zoster.  Therefore I recommended that she complete her course of Valtrex.  He needs to follow-up with cardiology.  If she does not hear from them in about a week or so she is to call us back so we can figure out what is going on.  UTI: Symptoms have resolved but I recommended completing the course of Keflex that was provided to her on discharge.  Uncontrolled type 2 diabetes mellitus ([place]) Spoke with pharmacy who reported that Novolin 70-30 is about $24 at [facility].  We will send another prescription to [facility] for Novolin 70/30, 22 units in the morning and 22 units in the evening (dose increased from 20 units twice daily).  Patient to continue metformin.  Discussed the importance of checking sugars 3 times a day.  Follow-up in 2 weeks.  Anemia Will repeat CBC today.  Patient has not had a colonoscopy in the past.  Will refer her to GI for colonoscopy.  AKI (acute kidney injury) ([place]) Will repeat BMP today.  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  She has a follow-up appointment with nephrology later on this month.  Follow-up in 2 weeks  Smiley Houseman, MD PGY [place]

## 2016-12-01 NOTE — Progress Notes (Signed)
Family Medicine Teaching Service Daily Progress Note Intern Pager: [phone]  Patient name: Sara Davis Medical record number: [id number] Date of birth: March 07, 1950 Age: 66 y.o. Gender: female  Primary Care Provider: Smiley Houseman, MD Consultants: cardio Code Status: full  Pt Overview and Major Events to Date:  10/23-admitted with chest pain for ACS rule out 10/23-CT negative for AAA or dissection.  10/24-Echo basically normal.  10/27-New painful rash indicating shingles, patient moved to airborne precautions  Assessment and Plan: Sara Davis is a 66 y.o. female presenting with Chest pain beginning 10/22 . PMH is significant for uncontrolled T2DM, tobacco abuse, and HTN.   Chest pain: now described as distributed along newly developing rash concerning for herpes zoster. Troponin negative 5. EKG with chronic ST depression. Per cardiology, not a good candidate for LHC due to acute renal dysfunction. Also not a good candidate for Lexiscan due to respiratory issue. However, patient seems to be doing well from resp stand point. Patient had chest pain overnight. EKG obtained and without significant change from prior.Talked to cardiology (Dr. Horace Porteous) again this morning. He suggested to stick with the outpatient Myoview.  - Nitroglycerin prn - Tylenol 650mg  q 6 hours - Zofran 4mg  q 6 prn - carvedilol 12.5 bid - Atorvastatin 20mg  - Asa 81mg   Skin rash: Likely herpes zoster.   -valacyclovir (started 10/28>) (7 day course) 1 g every 24 hr based on her estimated renal clearance of 20 ml/min -May need to adjust dosing daily based on renal clearance  Hypertension:normotensive ON.  - Amlodipine 10mg  daily - carvedilol 12.5  AKI: morning labs pending Prior peak 2.21. Baseline 1.0-1.1.  AKI is multifactorial (contrast, iatrogenic, ?dehydration). Lisinopril and HCTZ discontinued. FE urea 33% suggestive for prerenal etiology. Renal ultrasound with normal size kidney for age and normal  echogenicity.  - daily bmp - avoid nephrotoxic meds -Lasix per nephro at 40mg  BID - Follow-up nephrology recommendations  T2DM, uncontrolled: A1c 10.5. Patient was unable to afford Lantus. She was also on metformin 1000 twice a day. Uses Novolin 70/30 20 units in a.m. and 10 units in p.m. she has been on Lantus 25 units and a sliding scale here. CBG consistently in 300s.  -Switched back to home Novolin 70/30 20 units twice a day - sSSI - monitor cbg  UTI: Patient with suprapubic tenderness on admission. UA with LE and bacteria.. Group b strep - start kelflex 250mg  q 8 hours 10/26> 8/1 (7 days) - Follow-up urine culture  Anemia of chronic disease: Hemoglobin at baseline, ~10. Iron panel w/n/l from 10/23. -Will monitor as needed   Tobacco abuse: patient reports 1/2 ppd smoking history. -nicotine patch -encourage smoking cessation   Constipation, subacute: Last bowel movement 10/23 after days of constipation -Scheduled MiraLAX 2, then when necessary  FEN/GI: heart healthy/carb modified diet   Prophylaxis: lovenox. Renally dosed  Disposition:  d/c to home with close followup in clinic  Subjective:  Patient feeling well and would like to go home.   Pain from shingles is tolerable.   No chest pain currently.   Understands to return to ED for any concerning chest pain.  Will see both [facility] clinic and nephrology in followup  Objective: Temp:  [97.5 F (36.4 C)-98.2 F (36.8 C)] 98.2 F (36.8 C) (10/29 2114) Pulse Rate:  [78-79] 79 (10/29 2114) Resp:  [16-18] 18 (10/29 2114) BP: (131)/(72) 131/72 (10/29 2114) SpO2:  [100 %] 100 % (10/29 2114) Physical Exam: General: awake and alert, sitting in bed, NAD Cardiovascular:  RRR. Respiratory: CTAB, no wheezes rales or rhonchi , no IWB Gastrointestinal: soft, non tender, non distended, bowel sounds normal  MSK: non tender, no edema Derm: spots of excoriated skin with surrounding skin erythema along dermoatome mid thoracic on Left  side. No pus or blisters.  Neuro: cn 2-12 intact, no focal neuro deficits Psych: normal affect   Laboratory:  Recent Labs Lab 11/26/16 0243 11/27/16 0229 11/29/16 1305  WBC 13.4* 8.6 8.3  HGB 9.6* 9.0* 8.9*  HCT 28.5* 27.2* 26.6*  PLT 333 320 325    Recent Labs Lab 11/28/16 0307 11/29/16 0355 11/29/16 1305 11/30/16 0128  NA 135 135  --  136  K 4.1 3.5  --  3.8  CL 103 100*  --  101  CO2 23 24  --  27  BUN 38* 27*  --  34*  CREATININE 2.21* 1.76*  --  2.57*  CALCIUM 9.1 9.8  --  9.4  PROT  --   --  6.1*  --   BILITOT  --   --  0.4  --   ALKPHOS  --   --  69  --   ALT  --   --  11*  --   AST  --   --  16  --   GLUCOSE 328* 215*  --  145*    Troponin <0.03 x3  Imaging/Diagnostic Tests: Dg Chest 2 View  Result Date: 11/24/2016 CLINICAL DATA:  Cough and chest pain EXAM: CHEST  2 VIEW COMPARISON:  None. FINDINGS: Lungs are clear. Heart size and pulmonary vascularity are normal. No adenopathy. There is degenerative change in the thoracic spine. There is aortic atherosclerosis. IMPRESSION: Aortic atherosclerosis.  No edema or consolidation. Aortic Atherosclerosis (ICD10-I70.0). Electronically Signed   By: Lowella Grip III M.D.   On: 11/24/2016 09:32   Ct Angio Chest/abd/pel For Dissection W And/or Wo Contrast  Result Date: 11/24/2016 CLINICAL DATA:  66 year old with four-day history of left-sided chest pain radiating into the upper back. Current history of type 2 diabetes and hypertension. Current smoker. EXAM: CT ANGIOGRAPHY CHEST, ABDOMEN AND PELVIS TECHNIQUE: Multidetector CT imaging through the chest, abdomen and pelvis was performed using the standard protocol during bolus administration of intravenous contrast. Multiplanar reconstructed images and MIPs were obtained and reviewed to evaluate the vascular anatomy. CONTRAST:  80 mL Isovue 370 IV. COMPARISON:  No prior CT.  Chest x-ray performed earlier same day. FINDINGS: CTA CHEST FINDINGS Cardiovascular: No evidence  of mural hematoma on the unenhanced images. Mild calcified plaque involving the descending thoracic and visualized upper abdominal aorta. No evidence of thoracic aortic aneurysm or dissection. Proximal great vessels widely patent with minimal atherosclerosis. Heart size normal with evidence of left ventricular hypertrophy. No visible coronary atherosclerosis. No pericardial effusion. Central pulmonary arteries widely patent. Mediastinum/Nodes: No pathologically enlarged mediastinal, hilar or axillary lymph nodes. No mediastinal masses. Normal-appearing esophagus. Thyroid gland normal in appearance. Lungs/Pleura: Mild emphysematous changes. Small bleb at the apex of the right lung. Mild right lower lobe bronchiectasis. Pulmonary parenchyma clear without localized airspace consolidation, interstitial disease, or parenchymal nodules or masses. Central airways patent without significant bronchial wall thickening. No pleural effusions. No pleural plaques or masses. Musculoskeletal: Degenerative changes involving the visualized lower cervical spine. Spondylosis involving mid and lower thoracic spine. Review of the MIP images confirms the above findings. CTA ABDOMEN AND PELVIS FINDINGS VASCULAR Aorta: Mild atherosclerosis. No evidence of abdominal aortic dissection or aneurysm. Celiac: Widely patent. SMA: Widely patent. Renals: Single renal arteries  bilaterally, widely patent. IMA: Widely patent. Inflow: Mild bilateral common iliac artery atherosclerosis without evidence of stenosis. Bilateral external iliac arteries and bilateral common femoral arteries widely patent. Veins: Not evaluated. Review of the MIP images confirms the above findings. NON-VASCULAR Hepatobiliary: Liver upper normal in size to perhaps mildly enlarged. No focal hepatic parenchymal abnormality. Approximate 1.2 cm calcified cholesterol gallstone within the otherwise normal-appearing gallbladder. No biliary ductal dilation. Pancreas: Mild pancreatic  atrophy. Approximate 6 mm cystic lesion in the body of the pancreas (series 6, image 158). No other focal pancreatic parenchymal abnormality. Spleen: Normal allowing for the early arterial phase of enhancement. Adrenals/Urinary Tract: Mild bilateral adrenal enlargement without nodularity. Normal-appearing kidneys allowing for the early arterial phase of enhancement. No hydronephrosis. No visible urinary tract calculi. Urinary bladder normal in appearance. Stomach/Bowel: Stomach normal in appearance for the degree of distention. Normal-appearing small bowel. Normal appearing colon with expected colonic stool burden. Normal appearing long appendix in the right mid and lower pelvis. Lymphatic: No pathologic lymphadenopathy. Reproductive: Surgically absent uterus.  No adnexal masses. Other: Small umbilical hernia containing fat. Musculoskeletal: Facet degenerative changes at L4-5 and L5-S1. Mild degenerative disc disease at L4-5 and L5-S1. Review of the MIP images confirms the above findings. IMPRESSION: 1. No evidence of thoracic or abdominal aortic aneurysm or dissection. Mild aortic atherosclerosis (Aortic Atherosclerosis (ICD10-170.0)). 2.  Emphysema. (ICD10-J43.9) 3.  No acute cardiopulmonary disease. 4. No acute abnormalities involving the abdomen or pelvis. 5. 6 mm cystic lesion involving the body of the pancreas. Recommend follow up pre and post contrast MRI/MRCP or pancreatic protocol CT in 2 years (and every 2 years thereafter for total of 10 years if stable). This recommendation follows ACR consensus guidelines: Management of Incidental Pancreatic Cysts: A White Paper of the ACR Incidental Findings Committee. [place] [id number];06:301-601. 6. Borderline to mild hepatomegaly. No focal hepatic parenchymal abnormality. 7. Cholelithiasis without evidence of acute cholecystitis. 8. Small umbilical hernia containing fat Electronically Signed   By: Evangeline Dakin M.D.   On: 11/24/2016 13:46     Sherene Sires, DO 12/01/2016, 6:58 AM PGY-3, [place] Intern pager: [phone], text pages welcome

## 2016-12-02 LAB — VARICELLA-ZOSTER BY PCR: VARICELLA-ZOSTER, PCR: NEGATIVE

## 2016-12-03 ENCOUNTER — Ambulatory Visit (INDEPENDENT_AMBULATORY_CARE_PROVIDER_SITE_OTHER): Payer: Medicare HMO | Admitting: Internal Medicine

## 2016-12-03 ENCOUNTER — Telehealth: Payer: Self-pay | Admitting: Family Medicine

## 2016-12-03 VITALS — BP 140/80 | HR 89 | Temp 98.1°F | Wt 153.0 lb

## 2016-12-03 DIAGNOSIS — N179 Acute kidney failure, unspecified: Secondary | ICD-10-CM

## 2016-12-03 DIAGNOSIS — Z794 Long term (current) use of insulin: Secondary | ICD-10-CM

## 2016-12-03 DIAGNOSIS — K862 Cyst of pancreas: Secondary | ICD-10-CM | POA: Diagnosis not present

## 2016-12-03 DIAGNOSIS — Z1211 Encounter for screening for malignant neoplasm of colon: Secondary | ICD-10-CM | POA: Diagnosis not present

## 2016-12-03 DIAGNOSIS — N183 Chronic kidney disease, stage 3 unspecified: Secondary | ICD-10-CM

## 2016-12-03 DIAGNOSIS — D649 Anemia, unspecified: Secondary | ICD-10-CM | POA: Insufficient documentation

## 2016-12-03 DIAGNOSIS — E118 Type 2 diabetes mellitus with unspecified complications: Secondary | ICD-10-CM

## 2016-12-03 DIAGNOSIS — E1165 Type 2 diabetes mellitus with hyperglycemia: Secondary | ICD-10-CM | POA: Diagnosis not present

## 2016-12-03 DIAGNOSIS — B029 Zoster without complications: Secondary | ICD-10-CM | POA: Diagnosis not present

## 2016-12-03 MED ORDER — INSULIN NPH ISOPHANE & REGULAR (70-30) 100 UNIT/ML ~~LOC~~ SUSP: 22.0000 [IU] | Freq: Two times a day (BID) | SUBCUTANEOUS | 3 refills | Status: DC

## 2016-12-03 MED ORDER — INSULIN NPH ISOPHANE & REGULAR (70-30) 100 UNIT/ML ~~LOC~~ SUSP: 20.0000 [IU] | Freq: Two times a day (BID) | SUBCUTANEOUS | 3 refills | Status: DC

## 2016-12-03 NOTE — Assessment & Plan Note (Signed)
Spoke with pharmacy who reported that Novolin 70-30 is about $24 at [facility].  We will send another prescription to [facility] for Novolin 70/30, 22 units in the morning and 22 units in the evening (dose increased from 20 units twice daily).  Patient to continue metformin.  Discussed the importance of checking sugars 3 times a day.  Follow-up in 2 weeks.

## 2016-12-03 NOTE — Telephone Encounter (Signed)
Encounter started in error.

## 2016-12-03 NOTE — Assessment & Plan Note (Signed)
Will repeat CBC today.  Patient has not had a colonoscopy in the past.  Will refer her to GI for colonoscopy.

## 2016-12-03 NOTE — Assessment & Plan Note (Addendum)
Will repeat BMP today.  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  She has a follow-up appointment with nephrology later on this month.

## 2016-12-03 NOTE — Patient Instructions (Addendum)
  Diet Recommendations for Diabetes   Starchy (carb) foods include: Bread, rice, pasta, potatoes, corn, crackers, bagels, muffins, all baked goods.  (Fruits, milk, and yogurt also have carbohydrate, but most of these foods will not spike your blood sugar as the starchy foods will.)  A few fruits do cause high blood sugars; use small portions of bananas (limit to 1/2 at a time), grapes, and most tropical fruits.    Protein foods include: Meat, fish, poultry, eggs, dairy foods, and beans such as pinto and kidney beans (beans also provide carbohydrate).   1. Eat at least 3 meals and 1-2 snacks per day. Never go more than 4-5 hours while awake without eating.  2. Limit starchy foods to TWO per meal and ONE per snack. ONE portion of a starchy  food is equal to the following:   - ONE slice of bread (or its equivalent, such as half of a hamburger bun).   - 1/2 cup of a "scoopable" starchy food such as potatoes or rice.   - 15 grams of carbohydrate as shown on food label.  3. Both lunch and dinner should include a protein food, a carb food, and vegetables.   - Obtain twice as many veg's as protein or carbohydrate foods for both lunch and dinner.   - Fresh or frozen veg's are best.   - Try to keep frozen veg's on hand for a quick vegetable serving.    4. Breakfast should always include protein.     Let's increase your 70/30 to 22 units in the AM and 22 units in the evening. I sent the prescription to [facility]  I made a referral to the GI doctor for a colonoscopy. They will call you to set up an appointment. Follow-up in 2 weeks for diabetes Get labs today and I will call you with the results. Please call the heart doctor about  Your stress test: [phone] ([facility])

## 2016-12-04 ENCOUNTER — Other Ambulatory Visit: Payer: Self-pay | Admitting: Family Medicine

## 2016-12-04 ENCOUNTER — Telehealth: Payer: Self-pay | Admitting: Family Medicine

## 2016-12-04 DIAGNOSIS — R7989 Other specified abnormal findings of blood chemistry: Secondary | ICD-10-CM

## 2016-12-04 DIAGNOSIS — E875 Hyperkalemia: Secondary | ICD-10-CM

## 2016-12-04 LAB — BASIC METABOLIC PANEL
BUN/Creatinine Ratio: 15 (ref 12–28)
BUN: 29 mg/dL — AB (ref 8–27)
CALCIUM: 9.8 mg/dL (ref 8.7–10.3)
CO2: 23 mmol/L (ref 20–29)
CREATININE: 1.96 mg/dL — AB (ref 0.57–1.00)
Chloride: 103 mmol/L (ref 96–106)
GFR, EST AFRICAN AMERICAN: 30 mL/min/{1.73_m2} — AB (ref >59)
GFR, EST NON AFRICAN AMERICAN: 26 mL/min/{1.73_m2} — AB (ref >59)
Glucose: 284 mg/dL — ABNORMAL HIGH (ref 65–99)
Potassium: 5.7 mmol/L — ABNORMAL HIGH (ref 3.5–5.2)
Sodium: 140 mmol/L (ref 134–144)

## 2016-12-04 LAB — CBC
HEMATOCRIT: 29.5 % — AB (ref 34.0–46.6)
HEMOGLOBIN: 9.1 g/dL — AB (ref 11.1–15.9)
MCH: 27.5 pg (ref 26.6–33.0)
MCHC: 30.8 g/dL — ABNORMAL LOW (ref 31.5–35.7)
MCV: 89 fL (ref 79–97)
Platelets: 477 10*3/uL — ABNORMAL HIGH (ref 150–379)
RBC: 3.31 x10E6/uL — AB (ref 3.77–5.28)
RDW: 14.9 % (ref 12.3–15.4)
WBC: 9.6 10*3/uL (ref 3.4–10.8)

## 2016-12-04 NOTE — Telephone Encounter (Signed)
Covering inbox for Dr. Dallas Schimke  Received results from patient's blood work when she saw Dr. Dallas Schimke. Called patient to discuss these results.   -Her creatinine has trended down which is great. She is recovering from AKI. Has appt with nephrology later this month  -She is still anemic with a hgb of 9.1 but that the anemia is not worsening. She has an appt with GI coming up for a colonoscopy  -Platelets are high at 477  -Glucose is high at 284 (known diabetes) -K is high at 5.7 (question if this is from hemolysis. Do not see anything on her med list that can cause hyperkalemia)  Would like for patient to get a repeat CBC for high platelets and repeat BMP for hyperkalemia. Orders placed for this. She will come in on Monday for the repeat labs. I will call her for any abnormalities and she will follow up in clinic in 2 weeks.   Smitty Cords, MD [place], PGY-3

## 2016-12-08 ENCOUNTER — Other Ambulatory Visit: Payer: Medicare HMO

## 2016-12-08 DIAGNOSIS — R7989 Other specified abnormal findings of blood chemistry: Secondary | ICD-10-CM

## 2016-12-08 DIAGNOSIS — E875 Hyperkalemia: Secondary | ICD-10-CM

## 2016-12-09 LAB — BASIC METABOLIC PANEL
BUN / CREAT RATIO: 14 (ref 12–28)
BUN: 21 mg/dL (ref 8–27)
CALCIUM: 9.5 mg/dL (ref 8.7–10.3)
CHLORIDE: 107 mmol/L — AB (ref 96–106)
CO2: 20 mmol/L (ref 20–29)
Creatinine, Ser: 1.45 mg/dL — ABNORMAL HIGH (ref 0.57–1.00)
GFR, EST AFRICAN AMERICAN: 44 mL/min/{1.73_m2} — AB (ref >59)
GFR, EST NON AFRICAN AMERICAN: 38 mL/min/{1.73_m2} — AB (ref >59)
Glucose: 190 mg/dL — ABNORMAL HIGH (ref 65–99)
POTASSIUM: 4.8 mmol/L (ref 3.5–5.2)
SODIUM: 140 mmol/L (ref 134–144)

## 2016-12-09 LAB — CBC WITH DIFFERENTIAL/PLATELET
BASOS ABS: 0 10*3/uL (ref 0.0–0.2)
BASOS: 0 %
EOS (ABSOLUTE): 0.1 10*3/uL (ref 0.0–0.4)
Eos: 1 %
Hematocrit: 24.8 % — ABNORMAL LOW (ref 34.0–46.6)
Hemoglobin: 8.2 g/dL — ABNORMAL LOW (ref 11.1–15.9)
IMMATURE GRANS (ABS): 0 10*3/uL (ref 0.0–0.1)
Immature Granulocytes: 0 %
Lymphocytes Absolute: 2.7 10*3/uL (ref 0.7–3.1)
Lymphs: 29 %
MCH: 28.2 pg (ref 26.6–33.0)
MCHC: 33.1 g/dL (ref 31.5–35.7)
MCV: 85 fL (ref 79–97)
Monocytes Absolute: 0.4 10*3/uL (ref 0.1–0.9)
Monocytes: 4 %
NEUTROS ABS: 5.9 10*3/uL (ref 1.4–7.0)
Neutrophils: 66 %
PLATELETS: 413 10*3/uL — AB (ref 150–379)
RBC: 2.91 x10E6/uL — ABNORMAL LOW (ref 3.77–5.28)
RDW: 15.9 % — AB (ref 12.3–15.4)
WBC: 9 10*3/uL (ref 3.4–10.8)

## 2016-12-16 DIAGNOSIS — I129 Hypertensive chronic kidney disease with stage 1 through stage 4 chronic kidney disease, or unspecified chronic kidney disease: Secondary | ICD-10-CM | POA: Diagnosis not present

## 2016-12-16 DIAGNOSIS — E1122 Type 2 diabetes mellitus with diabetic chronic kidney disease: Secondary | ICD-10-CM | POA: Diagnosis not present

## 2016-12-16 DIAGNOSIS — D631 Anemia in chronic kidney disease: Secondary | ICD-10-CM | POA: Diagnosis not present

## 2016-12-16 DIAGNOSIS — Z720 Tobacco use: Secondary | ICD-10-CM | POA: Diagnosis not present

## 2016-12-16 DIAGNOSIS — N183 Chronic kidney disease, stage 3 (moderate): Secondary | ICD-10-CM | POA: Diagnosis not present

## 2016-12-17 ENCOUNTER — Ambulatory Visit: Payer: Medicare HMO | Admitting: Internal Medicine

## 2016-12-20 NOTE — Progress Notes (Deleted)
   [facility] Phone: [phone]   Date of Visit: 12/21/2016   HPI:  ***  ROS: See HPI.  [place]:  PMH: HTN DM2 Tobacco Use  PHYSICAL EXAM: There were no vitals taken for this visit. Gen: *** HEENT: *** Heart: *** Lungs: *** Neuro: *** Ext: ***  ASSESSMENT/PLAN:  Health maintenance:  -***  No problem-specific Assessment & Plan notes found for this encounter.  FOLLOW UP: Follow up in *** for ***  Smiley Houseman, MD PGY [place]

## 2016-12-21 ENCOUNTER — Ambulatory Visit: Payer: Medicare HMO | Admitting: Internal Medicine

## 2017-01-06 ENCOUNTER — Encounter: Payer: Self-pay | Admitting: Internal Medicine

## 2017-01-06 ENCOUNTER — Ambulatory Visit (INDEPENDENT_AMBULATORY_CARE_PROVIDER_SITE_OTHER): Payer: Medicare HMO | Admitting: Internal Medicine

## 2017-01-06 ENCOUNTER — Other Ambulatory Visit: Payer: Self-pay

## 2017-01-06 VITALS — BP 128/60 | HR 78 | Temp 98.0°F | Resp 18 | Ht 66.0 in | Wt 157.0 lb

## 2017-01-06 DIAGNOSIS — E1165 Type 2 diabetes mellitus with hyperglycemia: Secondary | ICD-10-CM | POA: Diagnosis not present

## 2017-01-06 DIAGNOSIS — I10 Essential (primary) hypertension: Secondary | ICD-10-CM

## 2017-01-06 DIAGNOSIS — H1032 Unspecified acute conjunctivitis, left eye: Secondary | ICD-10-CM | POA: Diagnosis not present

## 2017-01-06 MED ORDER — GABAPENTIN 300 MG PO CAPS: ORAL_CAPSULE | ORAL | 2 refills | Status: DC

## 2017-01-06 MED ORDER — INSULIN NPH ISOPHANE & REGULAR (70-30) 100 UNIT/ML ~~LOC~~ SUSP: 14.0000 [IU] | Freq: Three times a day (TID) | SUBCUTANEOUS | 3 refills | Status: DC

## 2017-01-06 MED ORDER — ASPIRIN 81 MG PO TABS: 81.0000 mg | ORAL_TABLET | Freq: Every day | ORAL | 3 refills | Status: DC

## 2017-01-06 MED ORDER — ATORVASTATIN CALCIUM 20 MG PO TABS: 20.0000 mg | ORAL_TABLET | Freq: Every day | ORAL | 0 refills | Status: DC

## 2017-01-06 MED ORDER — METFORMIN HCL 1000 MG PO TABS: 1000.0000 mg | ORAL_TABLET | Freq: Two times a day (BID) | ORAL | 2 refills | Status: DC

## 2017-01-06 MED ORDER — ERYTHROMYCIN 5 MG/GM OP OINT: 1.0000 "application " | TOPICAL_OINTMENT | Freq: Three times a day (TID) | OPHTHALMIC | 0 refills | Status: AC

## 2017-01-06 NOTE — Progress Notes (Signed)
   [facility] Phone: [phone]   Date of Visit: 01/06/2017   HPI:  Left eye redness: -Port of eft eye redness and swelling of the border of the left upper eyelid for the past week. -Reports that there was some drainage last week that looked like pus now is starting to be clear -No blurred vision, no eye pain, no headaches -Has tried warm compresses -Feels like her symptoms are improving  Type 2 diabetes: -Medication: 70/30 22 units twice daily, metformin 1000 mg twice daily -Reports that her morning fasting sugars are between 125-130.  She checks her sugar right before dinner and reports that on average is around 260.  No lows -Denies any polyuria or polydipsia -Reports that sometimes she is does not eat breakfast because she is not hungry.  We discussed the importance of eating regularly throughout the day especially since she is on insulin.  Hypertension: -Medications: Coreg 12.5 mg twice daily.  She had run out of Norvasc 10 mg daily for the past week -Denies any chest pain, headaches, blurred vision, lower extremity swelling -Does not check blood pressures at home  ROS: See HPI.  [place]:   PHYSICAL EXAM: BP 128/60   Pulse 78   Temp 98 F (36.7 C) (Oral)   Resp 18   Ht 5\' 6"  (1.676 m)   Wt 157 lb (71.2 kg)   BMI 25.34 kg/m  GEN: NAD HEENT: Atraumatic, normocephalic, neck supple, EOMI, pupils equal and reactive to light.  Left eye: Upper eyelid swelling near her eyelashes consistent with blepharitis.  Additionally she has mild conjunctival erythema.  No drainage is noted on exam. CV: RRR, no murmurs, rubs, or gallops PULM: CTAB, normal effort ABD: Soft, nontender, nondistended, NABS, no organomegaly SKIN: No rash or cyanosis; warm and well-perfused EXTR: No lower extremity edema or calf tenderness PSYCH: Mood and affect euthymic, normal rate and volume of speech NEURO: Awake, alert, no focal deficits grossly, normal  speech   ASSESSMENT/PLAN:   Essential hypertension Her blood pressure is 128/60 today while only on carvedilol 12.5 mg twice daily.  She ran out of Norvasc about 1 week ago.  Because of her stable blood pressure I will hold off on restarting Norvasc.  I asked the patient to monitor her blood pressure by going to the pharmacy in a few days.  Want her to come back in 1-2 weeks to discuss hypertension and diabetes.  Uncontrolled type 2 diabetes mellitus ([place]) Her morning CBG seemed to be at goal however she has elevated CBGs before supper.  No lows.  Currently on 22 units twice daily of 70/30.  Will try 70/30 14 units 3 times daily with meals to see if we can bring down the evening sugar.  She is to call me if she has lows or persistently elevated CBGs.  Follow-up in 1-2 weeks.   Acute conjunctivitis of the left eye: Symptoms consistent with conjunctivitis.  There is no sign or symptoms of preorbital or orbital cellulitis.  Not consistent with acute angle closure glaucoma.  Most likely viral conjunctivitis but will treat with erythromycin ointment 3 times daily for 5 days.  Discussed return precautions for this.  Smiley Houseman, MD PGY [place]

## 2017-01-06 NOTE — Assessment & Plan Note (Signed)
Her morning CBG seemed to be at goal however she has elevated CBGs before supper.  No lows.  Currently on 22 units twice daily of 70/30.  Will try 70/30 14 units 3 times daily with meals to see if we can bring down the evening sugar.  She is to call me if she has lows or persistently elevated CBGs.  Follow-up in 1-2 weeks.

## 2017-01-06 NOTE — Patient Instructions (Addendum)
  Diet Recommendations for Diabetes   Starchy (carb) foods include: Bread, rice, pasta, potatoes, corn, crackers, bagels, muffins, all baked goods.  (Fruits, milk, and yogurt also have carbohydrate, but most of these foods will not spike your blood sugar as the starchy foods will.)  A few fruits do cause high blood sugars; use small portions of bananas (limit to 1/2 at a time), grapes, and most tropical fruits.    Protein foods include: Meat, fish, poultry, eggs, dairy foods, and beans such as pinto and kidney beans (beans also provide carbohydrate).   1. Eat at least 3 meals and 1-2 snacks per day. Never go more than 4-5 hours while awake without eating.  2. Limit starchy foods to TWO per meal and ONE per snack. ONE portion of a starchy  food is equal to the following:   - ONE slice of bread (or its equivalent, such as half of a hamburger bun).   - 1/2 cup of a "scoopable" starchy food such as potatoes or rice.   - 15 grams of carbohydrate as shown on food label.  3. Both lunch and dinner should include a protein food, a carb food, and vegetables.   - Obtain twice as many veg's as protein or carbohydrate foods for both lunch and dinner.   - Fresh or frozen veg's are best.   - Try to keep frozen veg's on hand for a quick vegetable serving.    4. Breakfast should always include protein.    Your sugar should be 130-160.  If your sugars are below 70.  Please check your sugars three times a day and write it down and bring to clinic.  We will change your 70/30 to 14 units three times a day with meals.  Let's wait to restart the Norvasc (amlodipine) because your blood pressure looks good. Go to the parmacy to check blood pressure. Continue to take Carvedilol. If your blood pressure is > 140/90, let me know  For your eye, please use the erythromycin ointment three times a day for 5 days.   Please follow up with me in 1-2 weeks.

## 2017-01-06 NOTE — Assessment & Plan Note (Signed)
Her blood pressure is 128/60 today while only on carvedilol 12.5 mg twice daily.  She ran out of Norvasc about 1 week ago.  Because of her stable blood pressure I will hold off on restarting Norvasc.  I asked the patient to monitor her blood pressure by going to the pharmacy in a few days.  Want her to come back in 1-2 weeks to discuss hypertension and diabetes.

## 2017-01-17 NOTE — Progress Notes (Deleted)
   [facility] Phone: [phone]   Date of Visit: 01/19/2017   HPI:  ***  ROS: See HPI.  [place]:  PMH: HTN Pancreatic Cyst  DM2 Tobacco Use   PHYSICAL EXAM: There were no vitals taken for this visit. Gen: *** HEENT: *** Heart: *** Lungs: *** Neuro: *** Ext: ***  ASSESSMENT/PLAN:  Health maintenance:  -***  No problem-specific Assessment & Plan notes found for this encounter.  FOLLOW UP: Follow up in *** for ***  Smiley Houseman, MD PGY [place]

## 2017-01-19 ENCOUNTER — Ambulatory Visit: Payer: Medicare HMO | Admitting: Internal Medicine

## 2017-02-04 ENCOUNTER — Encounter: Payer: Self-pay | Admitting: Internal Medicine

## 2017-03-01 DIAGNOSIS — H40033 Anatomical narrow angle, bilateral: Secondary | ICD-10-CM | POA: Diagnosis not present

## 2017-03-01 DIAGNOSIS — H2513 Age-related nuclear cataract, bilateral: Secondary | ICD-10-CM | POA: Diagnosis not present

## 2017-03-03 ENCOUNTER — Telehealth: Payer: Self-pay | Admitting: Internal Medicine

## 2017-03-03 NOTE — Telephone Encounter (Signed)
Will forward to MD. Jazmin Hartsell,CMA  

## 2017-03-03 NOTE — Telephone Encounter (Signed)
Patient would like to find out/make sure they are healthy enough for her eye surgery on 03/12/17 and needs something in writing stating this.  She is seeing Dr. Manuella Ghazi at [facility] (phone # [phone]).  Please give her a call and let her know if she needs to do anything beforehand.  You may call her at [phone]

## 2017-03-04 NOTE — Telephone Encounter (Signed)
Attempted to call patient but went to voicemail. Will try again later.

## 2017-03-04 NOTE — Telephone Encounter (Signed)
Letter faxed.  Nobuo Nunziata,CMA  

## 2017-03-04 NOTE — Telephone Encounter (Signed)
Spoke with patient regarding her surgery. She is getting cataract surgery. She is not sure if she will be getting general or local anesthesia. She denies any chest pain or DOE. She reports she is able to climb flight of stairs without difficulty. She does have uncontrolled diabetes which is improving.     [employer] [facility], I have written a letter for her to proceed with surgery. Please print and fax to Dr. Manuella Ghazi at [facility].

## 2017-03-12 DIAGNOSIS — H4313 Vitreous hemorrhage, bilateral: Secondary | ICD-10-CM | POA: Diagnosis not present

## 2017-03-16 DIAGNOSIS — H4313 Vitreous hemorrhage, bilateral: Secondary | ICD-10-CM | POA: Diagnosis not present

## 2017-03-16 DIAGNOSIS — H4312 Vitreous hemorrhage, left eye: Secondary | ICD-10-CM | POA: Diagnosis not present

## 2017-03-16 DIAGNOSIS — H4311 Vitreous hemorrhage, right eye: Secondary | ICD-10-CM | POA: Diagnosis not present

## 2017-03-18 DIAGNOSIS — H3342 Traction detachment of retina, left eye: Secondary | ICD-10-CM | POA: Diagnosis not present

## 2017-03-18 DIAGNOSIS — H4312 Vitreous hemorrhage, left eye: Secondary | ICD-10-CM | POA: Diagnosis not present

## 2017-03-30 DIAGNOSIS — H4311 Vitreous hemorrhage, right eye: Secondary | ICD-10-CM | POA: Diagnosis not present

## 2017-04-09 DIAGNOSIS — H4311 Vitreous hemorrhage, right eye: Secondary | ICD-10-CM | POA: Diagnosis not present

## 2017-04-13 DIAGNOSIS — H4311 Vitreous hemorrhage, right eye: Secondary | ICD-10-CM | POA: Diagnosis not present

## 2017-05-03 DIAGNOSIS — H4311 Vitreous hemorrhage, right eye: Secondary | ICD-10-CM | POA: Diagnosis not present

## 2017-05-05 NOTE — Progress Notes (Addendum)
   [facility] Phone: [phone]   Date of Visit: 05/06/2017   HPI: Have not seen patient since October.  DM2:  - Medications: Metformin 1092m BID, 70/30 14 units TID with meals.  - patient reports of missing insulin 3 times a week on average. When she is out of the house she does not take insulin with her so sometimes she does not take insulin the entire day.  - she did not bring her cbg log today. She reports her AM sugars are in the 200s (230s this AM), before lunch 160s, and before dinner "sometimes 300s but not always" - she reports she does monitor her carbohydrate intake   HTN:  - Medications: she is supposed to be on Coreg currently but reports she did not know she was on BP medications - she denies chest pain, HA, blurred vision, or DOE  - reports she does not add salt to her diet.   HLD:  - she is not taking Lipitor as prescribed. Was not aware she needed to be on this.   Anemia:  - denies blood in stool or dark tarry stool - per chart review, last hgb was 8.2 in 12/2016. Baseline is around 9.    ROS: See HPI.  [place]  PMH: HTN DM2 with foot ulder Pancreatic Cyst Tobacco Use  PHYSICAL EXAM: BP (!) 154/62   Pulse 77   Temp 98.6 F (37 C) (Oral)   Ht _0  (1.676 m)   Wt 151 lb 6.4 oz (68.7 kg)   SpO2 97%   BMI 24.44 kg/m  GEN: NAD HEENT: EOMI, sclera clear  CV: RRR, no rubs, or gallops; 3/6 systolic murmur best heard at the right upper sternal border  PULM: CTAB, normal effort ABD: Soft, nontender, nondistended, NABS, no organomegaly SKIN: No rash or cyanosis; warm and well-perfused EXTR: No lower extremity edema or calf tenderness PSYCH: Mood and affect euthymic, normal rate and volume of speech NEURO: Awake, alert, no focal deficits grossly, normal speech  Diabetic Foot Exam - Simple   Simple Foot Form Diabetic Foot exam was performed with the following findings:  Yes 05/06/2017  4:21 PM  Visual Inspection No deformities,  no ulcerations, no other skin breakdown bilaterally:  Yes Sensation Testing Intact to touch and monofilament testing bilaterally:  Yes Pulse Check Posterior Tibialis and Dorsalis pulse intact bilaterally:  Yes Comments     ASSESSMENT/PLAN:  Essential hypertension Initial SBP to 160 with repeat to 154. Last time we met in clinic, it was recommended to continue Coreg. Patient reports she is not aware she was on this medication. Coreg 12.5 mg BID sent to pharmacy.    Uncontrolled type 2 diabetes mellitus ([place] Discussed the importance of keeping a cbg log and not missing doses of insulin. Due to missed doses, hesitant to increase her insulin. Continue 70/30 14 units TID with meals. I made patient an appointment with Dr. KValentina Lucksnext Thursday in pharmacy clinic for continued titration of insulin. Foot exam done. Urine microalbumin/cr ratio today. CMP today. Restart Lipitor and ASA 857m  Anemia: Repeat CBC, obtain iron panel.  Will need screening colonoscopy. Will discuss over the phone.   KaSmiley HousemanMD PGY 3 [place]

## 2017-05-06 ENCOUNTER — Encounter: Payer: Self-pay | Admitting: Internal Medicine

## 2017-05-06 ENCOUNTER — Other Ambulatory Visit: Payer: Self-pay | Admitting: Internal Medicine

## 2017-05-06 ENCOUNTER — Ambulatory Visit (INDEPENDENT_AMBULATORY_CARE_PROVIDER_SITE_OTHER): Payer: Medicare HMO | Admitting: Internal Medicine

## 2017-05-06 ENCOUNTER — Other Ambulatory Visit: Payer: Self-pay

## 2017-05-06 VITALS — BP 154/62 | HR 77 | Temp 98.6°F | Ht 66.0 in | Wt 151.4 lb

## 2017-05-06 DIAGNOSIS — I10 Essential (primary) hypertension: Secondary | ICD-10-CM | POA: Diagnosis not present

## 2017-05-06 DIAGNOSIS — Z1159 Encounter for screening for other viral diseases: Secondary | ICD-10-CM

## 2017-05-06 DIAGNOSIS — E1165 Type 2 diabetes mellitus with hyperglycemia: Secondary | ICD-10-CM | POA: Diagnosis not present

## 2017-05-06 DIAGNOSIS — D649 Anemia, unspecified: Secondary | ICD-10-CM

## 2017-05-06 LAB — POCT GLYCOSYLATED HEMOGLOBIN (HGB A1C): HEMOGLOBIN A1C: 9.9

## 2017-05-06 MED ORDER — ATORVASTATIN CALCIUM 20 MG PO TABS: 20.0000 mg | ORAL_TABLET | Freq: Every day | ORAL | 1 refills | Status: DC

## 2017-05-06 MED ORDER — INSULIN NPH ISOPHANE & REGULAR (70-30) 100 UNIT/ML ~~LOC~~ SUSP: 14.0000 [IU] | Freq: Three times a day (TID) | SUBCUTANEOUS | 3 refills | Status: DC

## 2017-05-06 MED ORDER — METFORMIN HCL 1000 MG PO TABS: 1000.0000 mg | ORAL_TABLET | Freq: Two times a day (BID) | ORAL | 2 refills | Status: DC

## 2017-05-06 MED ORDER — CARVEDILOL 12.5 MG PO TABS: 12.5000 mg | ORAL_TABLET | Freq: Two times a day (BID) | ORAL | 1 refills | Status: DC

## 2017-05-06 MED ORDER — ASPIRIN 81 MG PO TABS: 81.0000 mg | ORAL_TABLET | Freq: Every day | ORAL | 3 refills | Status: DC

## 2017-05-06 NOTE — Assessment & Plan Note (Addendum)
Initial SBP to 160 with repeat to 154. Last time we met in clinic, it was recommended to continue Coreg. Patient reports she is not aware she was on this medication. Coreg 12.5 mg BID sent to pharmacy.

## 2017-05-06 NOTE — Patient Instructions (Addendum)
Appointment with Dr. Valentina Lucks in pharmacy clinic on May 13, 2017 at [facility]   We will get lab work today.    Diet Recommendations for Diabetes   Starchy (carb) foods include: Bread, rice, pasta, potatoes, corn, crackers, bagels, muffins, all baked goods.  (Fruits, milk, and yogurt also have carbohydrate, but most of these foods will not spike your blood sugar as the starchy foods will.)  A few fruits do cause high blood sugars; use small portions of bananas (limit to 1/2 at a time), grapes, and most tropical fruits.    Protein foods include: Meat, fish, poultry, eggs, dairy foods, and beans such as pinto and kidney beans (beans also provide carbohydrate).   1. Eat at least 3 meals and 1-2 snacks per day. Never go more than 4-5 hours while awake without eating.  2. Limit starchy foods to TWO per meal and ONE per snack. ONE portion of a starchy  food is equal to the following:   - ONE slice of bread (or its equivalent, such as half of a hamburger bun).   - 1/2 cup of a "scoopable" starchy food such as potatoes or rice.   - 15 grams of carbohydrate as shown on food label.  3. Both lunch and dinner should include a protein food, a carb food, and vegetables.   - Obtain twice as many veg's as protein or carbohydrate foods for both lunch and dinner.   - Fresh or frozen veg's are best.   - Try to keep frozen veg's on hand for a quick vegetable serving.    4. Breakfast should always include protein.     DASH Eating Plan DASH stands for "Dietary Approaches to Stop Hypertension." The DASH eating plan is a healthy eating plan that has been shown to reduce high blood pressure (hypertension). It may also reduce your risk for type 2 diabetes, heart disease, and stroke. The DASH eating plan may also help with weight loss. What are tips for following this plan? General guidelines  Avoid eating more than 2,300 mg (milligrams) of salt (sodium) a day. If you have hypertension, you may need to reduce your sodium  intake to 1,500 mg a day.  Limit alcohol intake to no more than 1 drink a day for nonpregnant women and 2 drinks a day for men. One drink equals 12 oz of beer, 5 oz of wine, or 1 oz of hard liquor.  Work with your health care provider to maintain a healthy body weight or to lose weight. Ask what an ideal weight is for you.  Get at least 30 minutes of exercise that causes your heart to beat faster (aerobic exercise) most days of the week. Activities may include walking, swimming, or biking.  Work with your health care provider or diet and nutrition specialist (dietitian) to adjust your eating plan to your individual calorie needs. Reading food labels  Check food labels for the amount of sodium per serving. Choose foods with less than 5 percent of the Daily Value of sodium. Generally, foods with less than 300 mg of sodium per serving fit into this eating plan.  To find whole grains, look for the word "whole" as the first word in the ingredient list. Shopping  Buy products labeled as "low-sodium" or "no salt added."  Buy fresh foods. Avoid canned foods and premade or frozen meals. Cooking  Avoid adding salt when cooking. Use salt-free seasonings or herbs instead of table salt or sea salt. Check with your health care provider  or pharmacist before using salt substitutes.  Do not fry foods. Cook foods using healthy methods such as baking, boiling, grilling, and broiling instead.  Cook with heart-healthy oils, such as olive, canola, soybean, or sunflower oil. Meal planning   Eat a balanced diet that includes: ? 5 or more servings of fruits and vegetables each day. At each meal, try to fill half of your plate with fruits and vegetables. ? Up to 6-8 servings of whole grains each day. ? Less than 6 oz of lean meat, poultry, or fish each day. A 3-oz serving of meat is about the same size as a deck of cards. One egg equals 1 oz. ? 2 servings of low-fat dairy each day. ? A serving of nuts,  seeds, or beans 5 times each week. ? Heart-healthy fats. Healthy fats called Omega-3 fatty acids are found in foods such as flaxseeds and coldwater fish, like sardines, salmon, and mackerel.  Limit how much you eat of the following: ? Canned or prepackaged foods. ? Food that is high in trans fat, such as fried foods. ? Food that is high in saturated fat, such as fatty meat. ? Sweets, desserts, sugary drinks, and other foods with added sugar. ? Full-fat dairy products.  Do not salt foods before eating.  Try to eat at least 2 vegetarian meals each week.  Eat more home-cooked food and less restaurant, buffet, and fast food.  When eating at a restaurant, ask that your food be prepared with less salt or no salt, if possible. What foods are recommended? The items listed may not be a complete list. Talk with your dietitian about what dietary choices are best for you. Grains Whole-grain or whole-wheat bread. Whole-grain or whole-wheat pasta. Brown rice. [place]row. Bulgur. Whole-grain and low-sodium cereals. Pita bread. Low-fat, low-sodium crackers. Whole-wheat flour tortillas. Vegetables Fresh or frozen vegetables (raw, steamed, roasted, or grilled). Low-sodium or reduced-sodium tomato and vegetable juice. Low-sodium or reduced-sodium tomato sauce and tomato paste. Low-sodium or reduced-sodium canned vegetables. Fruits All fresh, dried, or frozen fruit. Canned fruit in natural juice (without added sugar). Meat and other protein foods Skinless chicken or [place]. Ground chicken or [place]. Pork with fat trimmed off. Fish and seafood. Egg whites. Dried beans, peas, or lentils. Unsalted nuts, nut butters, and seeds. Unsalted canned beans. Lean cuts of beef with fat trimmed off. Low-sodium, lean deli meat. Dairy Low-fat (1%) or fat-free (skim) milk. Fat-free, low-fat, or reduced-fat cheeses. Nonfat, low-sodium ricotta or cottage cheese. Low-fat or nonfat yogurt. Low-fat, low-sodium cheese. Fats  and oils Soft margarine without trans fats. Vegetable oil. Low-fat, reduced-fat, or light mayonnaise and salad dressings (reduced-sodium). Canola, safflower, olive, soybean, and sunflower oils. Avocado. Seasoning and other foods Herbs. Spices. Seasoning mixes without salt. Unsalted popcorn and pretzels. Fat-free sweets. What foods are not recommended? The items listed may not be a complete list. Talk with your dietitian about what dietary choices are best for you. Grains Baked goods made with fat, such as croissants, muffins, or some breads. Dry pasta or rice meal packs. Vegetables Creamed or fried vegetables. Vegetables in a cheese sauce. Regular canned vegetables (not low-sodium or reduced-sodium). Regular canned tomato sauce and paste (not low-sodium or reduced-sodium). Regular tomato and vegetable juice (not low-sodium or reduced-sodium). Angie Fava. Olives. Fruits Canned fruit in a light or heavy syrup. Fried fruit. Fruit in cream or butter sauce. Meat and other protein foods Fatty cuts of meat. Ribs. Fried meat. Berniece Salines. Sausage. Bologna and other processed lunch meats. Salami. Fatback. Hotdogs.  Bratwurst. Salted nuts and seeds. Canned beans with added salt. Canned or smoked fish. Whole eggs or egg yolks. Chicken or [place] with skin. Dairy Whole or 2% milk, cream, and half-and-half. Whole or full-fat cream cheese. Whole-fat or sweetened yogurt. Full-fat cheese. Nondairy creamers. Whipped toppings. Processed cheese and cheese spreads. Fats and oils Butter. Stick margarine. Lard. Shortening. Ghee. Bacon fat. Tropical oils, such as coconut, palm kernel, or palm oil. Seasoning and other foods Salted popcorn and pretzels. Onion salt, garlic salt, seasoned salt, table salt, and sea salt. Worcestershire sauce. Tartar sauce. Barbecue sauce. Teriyaki sauce. Soy sauce, including reduced-sodium. Steak sauce. Canned and packaged gravies. Fish sauce. Oyster sauce. Cocktail sauce. Horseradish that you find on  the shelf. Ketchup. Mustard. Meat flavorings and tenderizers. Bouillon cubes. Hot sauce and Tabasco sauce. Premade or packaged marinades. Premade or packaged taco seasonings. Relishes. Regular salad dressings. Where to find more information:  National Heart, Lung, and [facility] [place]: https://wilson-eaton.com/  American Heart Association: www.heart.org Summary  The DASH eating plan is a healthy eating plan that has been shown to reduce high blood pressure (hypertension). It may also reduce your risk for type 2 diabetes, heart disease, and stroke.  With the DASH eating plan, you should limit salt (sodium) intake to 2,300 mg a day. If you have hypertension, you may need to reduce your sodium intake to 1,500 mg a day.  When on the DASH eating plan, aim to eat more fresh fruits and vegetables, whole grains, lean proteins, low-fat dairy, and heart-healthy fats.  Work with your health care provider or diet and nutrition specialist (dietitian) to adjust your eating plan to your individual calorie needs. This information is not intended to replace advice given to you by your health care provider. Make sure you discuss any questions you have with your health care provider. Document Released: 01/08/2011 Document Revised: 01/13/2016 Document Reviewed: 01/13/2016 Elsevier Interactive Patient Education  [employer].

## 2017-05-06 NOTE — Assessment & Plan Note (Addendum)
Discussed the importance of keeping a cbg log and not missing doses of insulin. Due to missed doses, hesitant to increase her insulin. Continue 70/30 14 units TID with meals. I made patient an appointment with Dr. Valentina Lucks next Thursday in pharmacy clinic for continued titration of insulin. Foot exam done. Urine microalbumin/cr ratio today. CMP today. Restart Lipitor and ASA 81mg .

## 2017-05-07 ENCOUNTER — Telehealth: Payer: Self-pay | Admitting: Internal Medicine

## 2017-05-07 LAB — CMP14+EGFR
A/G RATIO: 2 (ref 1.2–2.2)
ALK PHOS: 94 IU/L (ref 39–117)
ALT: 8 IU/L (ref 0–32)
AST: 12 IU/L (ref 0–40)
Albumin: 3.8 g/dL (ref 3.6–4.8)
BUN/Creatinine Ratio: 12 (ref 12–28)
BUN: 22 mg/dL (ref 8–27)
Bilirubin Total: <0.2 mg/dL (ref 0.0–1.2)
CHLORIDE: 108 mmol/L — AB (ref 96–106)
CO2: 20 mmol/L (ref 20–29)
Calcium: 10 mg/dL (ref 8.7–10.3)
Creatinine, Ser: 1.89 mg/dL — ABNORMAL HIGH (ref 0.57–1.00)
GFR calc Af Amer: 32 mL/min/{1.73_m2} — ABNORMAL LOW (ref >59)
GFR calc non Af Amer: 27 mL/min/{1.73_m2} — ABNORMAL LOW (ref >59)
GLOBULIN, TOTAL: 1.9 g/dL (ref 1.5–4.5)
Glucose: 236 mg/dL — ABNORMAL HIGH (ref 65–99)
POTASSIUM: 4.5 mmol/L (ref 3.5–5.2)
SODIUM: 143 mmol/L (ref 134–144)
Total Protein: 5.7 g/dL — ABNORMAL LOW (ref 6.0–8.5)

## 2017-05-07 LAB — CBC
HEMATOCRIT: 32.2 % — AB (ref 34.0–46.6)
Hemoglobin: 10.3 g/dL — ABNORMAL LOW (ref 11.1–15.9)
MCH: 26.4 pg — ABNORMAL LOW (ref 26.6–33.0)
MCHC: 32 g/dL (ref 31.5–35.7)
MCV: 83 fL (ref 79–97)
PLATELETS: 352 10*3/uL (ref 150–379)
RBC: 3.9 x10E6/uL (ref 3.77–5.28)
RDW: 14.6 % (ref 12.3–15.4)
WBC: 7.3 10*3/uL (ref 3.4–10.8)

## 2017-05-07 LAB — HEPATITIS C ANTIBODY: Hep C Virus Ab: <0.1 s/co ratio (ref 0.0–0.9)

## 2017-05-07 LAB — IRON AND TIBC
Iron Saturation: 24 % (ref 15–55)
Iron: 74 ug/dL (ref 27–139)
TIBC: 304 ug/dL (ref 250–450)
UIBC: 230 ug/dL (ref 118–369)

## 2017-05-07 LAB — MICROALBUMIN / CREATININE URINE RATIO
Creatinine, Urine: 136.3 mg/dL
MICROALB/CREAT RATIO: 4246.1 mg/g{creat} — AB (ref 0.0–30.0)
Microalbumin, Urine: 5787.4 ug/mL

## 2017-05-07 LAB — FERRITIN: Ferritin: 20 ng/mL (ref 15–150)

## 2017-05-07 NOTE — Telephone Encounter (Signed)
Attempted to call patient about lab results. But went to voicemail. Left message to call back

## 2017-05-10 ENCOUNTER — Telehealth: Payer: Self-pay | Admitting: Internal Medicine

## 2017-05-10 NOTE — Telephone Encounter (Signed)
Attempted to call to discuss results but went to voicemail. Left message to call back.

## 2017-05-11 DIAGNOSIS — E113511 Type 2 diabetes mellitus with proliferative diabetic retinopathy with macular edema, right eye: Secondary | ICD-10-CM | POA: Diagnosis not present

## 2017-05-11 DIAGNOSIS — E113513 Type 2 diabetes mellitus with proliferative diabetic retinopathy with macular edema, bilateral: Secondary | ICD-10-CM | POA: Diagnosis not present

## 2017-05-11 DIAGNOSIS — E113512 Type 2 diabetes mellitus with proliferative diabetic retinopathy with macular edema, left eye: Secondary | ICD-10-CM | POA: Diagnosis not present

## 2017-05-11 DIAGNOSIS — H2512 Age-related nuclear cataract, left eye: Secondary | ICD-10-CM | POA: Diagnosis not present

## 2017-05-13 ENCOUNTER — Ambulatory Visit: Payer: Medicare HMO | Admitting: Pharmacist

## 2017-05-20 ENCOUNTER — Ambulatory Visit: Payer: Medicare HMO | Admitting: Pharmacist

## 2017-05-24 ENCOUNTER — Telehealth: Payer: Self-pay | Admitting: Internal Medicine

## 2017-05-24 MED ORDER — FERROUS SULFATE 325 (65 FE) MG PO TABS: 325.0000 mg | ORAL_TABLET | Freq: Every day | ORAL | 1 refills | Status: DC

## 2017-05-24 NOTE — Telephone Encounter (Signed)
Got in touch with patient:  A1c is elevated. She reports her sugars have been "gunning good" but did not have her log in hand with her to review. She was supposed to follow up with Dr. Valentina Lucks earlier this month but reports that she had an emergency that day. I made her another appointment next Monday at [place]   Her microalbumin/creatinine ratio is significantly elevated and is in the nephrotic range. She reports that she established care with [facility] in 12/2016 but does not recall if her urine was tested. Reviewed note, but did not see urinalysis or Urine pr/cr ratio. I asked her to make an appointment with them. I will send Dr. Hollie Salk a message through Mobile as well.   Her anemia has improved from last check. Her iron studies are not significantly low but since her iron sat is < 30% and ferritin is in low normal range, will start her on daily Ferrous sulfate.

## 2017-05-31 ENCOUNTER — Ambulatory Visit: Payer: Medicare HMO | Admitting: Pharmacist

## 2017-06-10 DIAGNOSIS — H2513 Age-related nuclear cataract, bilateral: Secondary | ICD-10-CM | POA: Diagnosis not present

## 2017-06-10 DIAGNOSIS — Z961 Presence of intraocular lens: Secondary | ICD-10-CM | POA: Diagnosis not present

## 2017-06-10 DIAGNOSIS — H2512 Age-related nuclear cataract, left eye: Secondary | ICD-10-CM | POA: Diagnosis not present

## 2017-06-10 DIAGNOSIS — H25812 Combined forms of age-related cataract, left eye: Secondary | ICD-10-CM | POA: Diagnosis not present

## 2017-07-15 DIAGNOSIS — H2513 Age-related nuclear cataract, bilateral: Secondary | ICD-10-CM | POA: Diagnosis not present

## 2017-07-15 DIAGNOSIS — Z961 Presence of intraocular lens: Secondary | ICD-10-CM | POA: Diagnosis not present

## 2017-07-16 DIAGNOSIS — H25811 Combined forms of age-related cataract, right eye: Secondary | ICD-10-CM | POA: Diagnosis not present

## 2017-07-16 DIAGNOSIS — H2511 Age-related nuclear cataract, right eye: Secondary | ICD-10-CM | POA: Diagnosis not present

## 2017-07-23 DIAGNOSIS — E113513 Type 2 diabetes mellitus with proliferative diabetic retinopathy with macular edema, bilateral: Secondary | ICD-10-CM | POA: Diagnosis not present

## 2017-09-17 DIAGNOSIS — E113512 Type 2 diabetes mellitus with proliferative diabetic retinopathy with macular edema, left eye: Secondary | ICD-10-CM | POA: Diagnosis not present

## 2017-09-17 DIAGNOSIS — E113511 Type 2 diabetes mellitus with proliferative diabetic retinopathy with macular edema, right eye: Secondary | ICD-10-CM | POA: Diagnosis not present

## 2017-09-17 DIAGNOSIS — E113513 Type 2 diabetes mellitus with proliferative diabetic retinopathy with macular edema, bilateral: Secondary | ICD-10-CM | POA: Diagnosis not present

## 2017-10-20 DIAGNOSIS — E113511 Type 2 diabetes mellitus with proliferative diabetic retinopathy with macular edema, right eye: Secondary | ICD-10-CM | POA: Diagnosis not present

## 2017-10-20 DIAGNOSIS — E113513 Type 2 diabetes mellitus with proliferative diabetic retinopathy with macular edema, bilateral: Secondary | ICD-10-CM | POA: Diagnosis not present

## 2017-10-20 DIAGNOSIS — E113512 Type 2 diabetes mellitus with proliferative diabetic retinopathy with macular edema, left eye: Secondary | ICD-10-CM | POA: Diagnosis not present

## 2017-12-22 DIAGNOSIS — E113512 Type 2 diabetes mellitus with proliferative diabetic retinopathy with macular edema, left eye: Secondary | ICD-10-CM | POA: Diagnosis not present

## 2017-12-22 DIAGNOSIS — E113511 Type 2 diabetes mellitus with proliferative diabetic retinopathy with macular edema, right eye: Secondary | ICD-10-CM | POA: Diagnosis not present

## 2017-12-22 DIAGNOSIS — E113513 Type 2 diabetes mellitus with proliferative diabetic retinopathy with macular edema, bilateral: Secondary | ICD-10-CM | POA: Diagnosis not present

## 2018-01-05 ENCOUNTER — Encounter: Payer: Self-pay | Admitting: Family Medicine

## 2018-01-05 ENCOUNTER — Telehealth: Payer: Self-pay

## 2018-01-05 ENCOUNTER — Other Ambulatory Visit: Payer: Self-pay

## 2018-01-05 ENCOUNTER — Ambulatory Visit (HOSPITAL_COMMUNITY)
Admission: RE | Admit: 2018-01-05 | Discharge: 2018-01-05 | Disposition: A | Discharge status: Home / Self Care | Payer: Medicare HMO | Source: Ambulatory Visit | Attending: Family Medicine | Admitting: Family Medicine

## 2018-01-05 ENCOUNTER — Ambulatory Visit (INDEPENDENT_AMBULATORY_CARE_PROVIDER_SITE_OTHER): Payer: Medicare HMO | Admitting: Family Medicine

## 2018-01-05 VITALS — BP 162/63 | HR 83 | Temp 97.9°F | Wt 149.0 lb

## 2018-01-05 DIAGNOSIS — E785 Hyperlipidemia, unspecified: Secondary | ICD-10-CM | POA: Diagnosis present

## 2018-01-05 DIAGNOSIS — E1165 Type 2 diabetes mellitus with hyperglycemia: Secondary | ICD-10-CM

## 2018-01-05 DIAGNOSIS — Z7982 Long term (current) use of aspirin: Secondary | ICD-10-CM | POA: Diagnosis not present

## 2018-01-05 DIAGNOSIS — N390 Urinary tract infection, site not specified: Secondary | ICD-10-CM | POA: Diagnosis not present

## 2018-01-05 DIAGNOSIS — I361 Nonrheumatic tricuspid (valve) insufficiency: Secondary | ICD-10-CM | POA: Diagnosis not present

## 2018-01-05 DIAGNOSIS — K5909 Other constipation: Secondary | ICD-10-CM | POA: Diagnosis present

## 2018-01-05 DIAGNOSIS — D125 Benign neoplasm of sigmoid colon: Secondary | ICD-10-CM | POA: Diagnosis not present

## 2018-01-05 DIAGNOSIS — I493 Ventricular premature depolarization: Secondary | ICD-10-CM | POA: Insufficient documentation

## 2018-01-05 DIAGNOSIS — F17210 Nicotine dependence, cigarettes, uncomplicated: Secondary | ICD-10-CM | POA: Diagnosis present

## 2018-01-05 DIAGNOSIS — D649 Anemia, unspecified: Secondary | ICD-10-CM

## 2018-01-05 DIAGNOSIS — Z90710 Acquired absence of both cervix and uterus: Secondary | ICD-10-CM | POA: Diagnosis not present

## 2018-01-05 DIAGNOSIS — D123 Benign neoplasm of transverse colon: Secondary | ICD-10-CM | POA: Diagnosis not present

## 2018-01-05 DIAGNOSIS — Z720 Tobacco use: Secondary | ICD-10-CM | POA: Diagnosis not present

## 2018-01-05 DIAGNOSIS — J439 Emphysema, unspecified: Secondary | ICD-10-CM | POA: Diagnosis present

## 2018-01-05 DIAGNOSIS — R008 Other abnormalities of heart beat: Secondary | ICD-10-CM | POA: Insufficient documentation

## 2018-01-05 DIAGNOSIS — R0602 Shortness of breath: Secondary | ICD-10-CM | POA: Insufficient documentation

## 2018-01-05 DIAGNOSIS — B9620 Unspecified Escherichia coli [E. coli] as the cause of diseases classified elsewhere: Secondary | ICD-10-CM | POA: Diagnosis present

## 2018-01-05 DIAGNOSIS — M7989 Other specified soft tissue disorders: Secondary | ICD-10-CM | POA: Diagnosis not present

## 2018-01-05 DIAGNOSIS — K253 Acute gastric ulcer without hemorrhage or perforation: Secondary | ICD-10-CM | POA: Diagnosis not present

## 2018-01-05 DIAGNOSIS — Z809 Family history of malignant neoplasm, unspecified: Secondary | ICD-10-CM | POA: Diagnosis not present

## 2018-01-05 DIAGNOSIS — E119 Type 2 diabetes mellitus without complications: Secondary | ICD-10-CM | POA: Diagnosis not present

## 2018-01-05 DIAGNOSIS — I5083 High output heart failure: Secondary | ICD-10-CM | POA: Diagnosis present

## 2018-01-05 DIAGNOSIS — N179 Acute kidney failure, unspecified: Secondary | ICD-10-CM | POA: Diagnosis not present

## 2018-01-05 DIAGNOSIS — I499 Cardiac arrhythmia, unspecified: Secondary | ICD-10-CM | POA: Diagnosis not present

## 2018-01-05 DIAGNOSIS — R42 Dizziness and giddiness: Secondary | ICD-10-CM | POA: Diagnosis not present

## 2018-01-05 DIAGNOSIS — Z8249 Family history of ischemic heart disease and other diseases of the circulatory system: Secondary | ICD-10-CM | POA: Diagnosis not present

## 2018-01-05 DIAGNOSIS — K31819 Angiodysplasia of stomach and duodenum without bleeding: Secondary | ICD-10-CM | POA: Diagnosis not present

## 2018-01-05 DIAGNOSIS — G4709 Other insomnia: Secondary | ICD-10-CM

## 2018-01-05 DIAGNOSIS — I272 Pulmonary hypertension, unspecified: Secondary | ICD-10-CM | POA: Diagnosis present

## 2018-01-05 DIAGNOSIS — R600 Localized edema: Secondary | ICD-10-CM | POA: Diagnosis not present

## 2018-01-05 DIAGNOSIS — I10 Essential (primary) hypertension: Secondary | ICD-10-CM

## 2018-01-05 DIAGNOSIS — D509 Iron deficiency anemia, unspecified: Secondary | ICD-10-CM | POA: Diagnosis present

## 2018-01-05 DIAGNOSIS — I130 Hypertensive heart and chronic kidney disease with heart failure and stage 1 through stage 4 chronic kidney disease, or unspecified chronic kidney disease: Secondary | ICD-10-CM | POA: Diagnosis present

## 2018-01-05 DIAGNOSIS — R160 Hepatomegaly, not elsewhere classified: Secondary | ICD-10-CM | POA: Diagnosis present

## 2018-01-05 DIAGNOSIS — K259 Gastric ulcer, unspecified as acute or chronic, without hemorrhage or perforation: Secondary | ICD-10-CM | POA: Diagnosis not present

## 2018-01-05 DIAGNOSIS — R195 Other fecal abnormalities: Secondary | ICD-10-CM | POA: Diagnosis present

## 2018-01-05 DIAGNOSIS — E1122 Type 2 diabetes mellitus with diabetic chronic kidney disease: Secondary | ICD-10-CM | POA: Diagnosis present

## 2018-01-05 DIAGNOSIS — I110 Hypertensive heart disease with heart failure: Secondary | ICD-10-CM | POA: Diagnosis not present

## 2018-01-05 DIAGNOSIS — D500 Iron deficiency anemia secondary to blood loss (chronic): Secondary | ICD-10-CM | POA: Diagnosis not present

## 2018-01-05 DIAGNOSIS — I5030 Unspecified diastolic (congestive) heart failure: Secondary | ICD-10-CM | POA: Diagnosis not present

## 2018-01-05 DIAGNOSIS — N183 Chronic kidney disease, stage 3 (moderate): Secondary | ICD-10-CM | POA: Diagnosis not present

## 2018-01-05 DIAGNOSIS — K254 Chronic or unspecified gastric ulcer with hemorrhage: Secondary | ICD-10-CM | POA: Diagnosis not present

## 2018-01-05 DIAGNOSIS — K862 Cyst of pancreas: Secondary | ICD-10-CM | POA: Diagnosis not present

## 2018-01-05 DIAGNOSIS — Z833 Family history of diabetes mellitus: Secondary | ICD-10-CM | POA: Diagnosis not present

## 2018-01-05 DIAGNOSIS — Z794 Long term (current) use of insulin: Secondary | ICD-10-CM | POA: Diagnosis not present

## 2018-01-05 LAB — POCT URINALYSIS DIP (MANUAL ENTRY)
Bilirubin, UA: NEGATIVE
Glucose, UA: 100 mg/dL — AB
Ketones, POC UA: NEGATIVE mg/dL
Nitrite, UA: POSITIVE — AB
Protein Ur, POC: >=300 mg/dL — AB
Spec Grav, UA: 1.025 (ref 1.010–1.025)
Urobilinogen, UA: 0.2 E.U./dL
pH, UA: 7.5 (ref 5.0–8.0)

## 2018-01-05 MED ORDER — FUROSEMIDE 20 MG PO TABS: 20.0000 mg | ORAL_TABLET | Freq: Every day | ORAL | 3 refills | Status: DC

## 2018-01-05 NOTE — Progress Notes (Signed)
Subjective:    Patient ID: Sara Davis, female    DOB: 11/19/1950, 67 y.o.   MRN: [id number]   CC: diabetes f/u, leg swelling  HPI:  Leg Swelling: Patient reports that she has had worsening leg swelling and SOB for the past 6 months. She endorses using 3 pillows at night to be able to sleep, which is new. Patient reports that's he has never been told she has heart failure.  She does have a large amount of protein in her urine which has not been followed up by her kidney doctor, as patient reports that she does not have time to go to the doctors.  She is taking care of her 11 yo mother.   Last ECHO: 11/25/16 Study Conclusions - Left ventricle: The cavity size was normal. Wall thickness was   increased in a pattern of moderate LVH. Systolic function was   normal. The estimated ejection fraction was in the range of 55%   to 60%. Wall motion was normal; there were no regional wall   motion abnormalities. Doppler parameters are consistent with   abnormal left ventricular relaxation (grade 1 diastolic   dysfunction). - Mitral valve: Calcified annulus. - Left atrium: The atrium was mildly dilated.  Diabetes:  Last A1c 9.9 on 05/06/17 Taking medications: metformin 1000mg  BID, NPH 20 U in m and 20U in pm Not on Aspirin (hurts stomach), and on statin Last eye exam: recently, gets injections for cataracts Last foot exam: up to date ROS: denies diaphoresis, LOC, polyuria, polydipsia  Hypertension: - Medications: coreg 12.5 BID, patient previously on lisinopril but was stopped due to AKI in hospital and not restarted. Patient does not follow up appopriately - Compliance: reports taking coreg - Checking BP at home: no - Denies any CP, vision changes, medication SEs, or symptoms of hypotension; reports SOB and leg swelling (see above)  Insomnia: Patient concerned with not sleeping. Has tried teas and habit changes. Reports she cant fall asleep. Not tried any OTC meds.   Smoking status  reviewed- smokes 1/2 ppd down from 2ppd previously- interested in quitting but does not want any medications or other interventions at this time  ROS: 10 point ROS is otherwise negative, except as mentioned in HPI  Patient Active Problem List   Diagnosis Date Noted  . CHF exacerbation ([place]) 01/06/2018  . Bilateral leg edema 01/06/2018  . Pancreatic cyst 12/03/2016  . Anemia 12/03/2016  . AKI (acute kidney injury) ([place])   . Protein-calorie malnutrition, severe 11/25/2016  . Chest pain   . Hypertensive urgency   . Onychomycosis 07/30/2015  . Essential hypertension   . Headache 04/18/2014  . Insomnia 07/31/2013  . Uncontrolled type 2 diabetes mellitus ([place]) 01/14/2012  . Tobacco abuse 01/14/2012     Objective:  BP (!) 162/63   Pulse 83   Temp 97.9 F (36.6 C) (Oral)   Wt 149 lb (67.6 kg)   SpO2 93%   BMI 24.05 kg/m  Vitals and nursing note reviewed  General: NAD, pleasant, frail, walking around room Head: Atraumatic, normocephalic Neck: Supple, no JVD noted Cardiac: RRR, normal heart sounds, 3/6 systolic murmur, >4OEV cap refill Respiratory: CTAB, normal effort, no crackles on exam, good air entry BL Abdomen: soft, nontender, nondistended Extremities: 3+ pitting edema to knees BL with L>R, no cyanosis. WWP. Skin: warm and dry, no rashes noted Neuro: alert and oriented, no focal deficits Psych: normal affect  Assessment & Plan:    Bilateral leg edema Likely patient has new onset  heart failure given orthopnea, BLLE pitting edema and new murmur heard on exam. Patient encouraged to allow [place] to direct admit, but reports that she cannot go into the hospital today. Patient walking around room and does not appear in any distress to go to the ED and has stable vitals. Discussed strict return precautions with patient and attending evaluated patient as well. See Dr. Macario Golds note.   Will obtain ECHO on 12/5.  EKG in office reviewed with no signs of STEMI and patient currently  without any chest pain or SOB.  Will obtain CMP, TSH, BNP Started patient on 20mg  lasix for swelling as she is lasix naive. Patient to return in 1-2 days for BP check and to evaluate leg swelling after starting Lasix.   Anemia Patient with worsening SOB and hx of anemia. Reports she has not been taking her iron supplementation. Will obtain CBC.   Insomnia Patient taking care of her 53 yo mom and having trouble sleeping. She has tried no medications. Will trial 5mg  melatonin.   Uncontrolled type 2 diabetes mellitus ([place]) Unable to obtain POC A1c after multiple attempts in office, could be due to low hemoglobin, also obtaining CBC. Will add A1c to venous draw. Patient does not check CBG's. Will continue current medications until A1c returns.  Will repeat UA and microalbumin/creatinine. Patient urged to schedule follow up with nephrology for proteinuria.    Essential hypertension Patient only on coreg 12.5 BID. Previously on ACEi that had been stopped during hospitalization in 11/2016 due to AKI. Patient compliant with coreg, but today initial BP 170/ 60. Patient appears to be having new onset CHF exacerbation, will return in 1-2 days for BP check as she would not like to go to the hospital today.   [name] Makenah Karas, DO Family Medicine Resident PGY-2

## 2018-01-05 NOTE — Telephone Encounter (Signed)
ECHO scheduled for 12/5 @ 8am. The next available is 12/8. PCP does not want her to wait that long.  LVM for pt to return my call.

## 2018-01-05 NOTE — Patient Instructions (Addendum)
Thank you for coming to see me today. It was a pleasure! Today we talked about:   Please schedule an appointment with your kidney doctor.   Please take lasix 20mg  daily. This will make you pee a lot to help with your fluid.   If you experience worsening shortening of breath or chest pain, please go to the Emergency Room.   We will call you with your results.   Please follow-up with me in 1 week or sooner.  If you have any questions or concerns, please do not hesitate to call the office at [phone].  Take Care,   [name] Anairis Knick, DO

## 2018-01-05 NOTE — Progress Notes (Signed)
Patient ID: Sara Davis, female   DOB: July 04, 1950, 67 y.o.   MRN: [id number]  I evaluated this patient with Dr. Enid Derry today. She stated she came in for glucose check today. However, she endorsed SOB over the past six months, which has not worsened. She uses three pillows at baseline. She gets fatigued with SOB when walking a long distance, but she is stable at rest.  She denies chest pain. There is associated leg swelling, which has worsened for the last few weeks.  Exam: Gen: Not in any form of distress. HEENT: no obvious thyromegaly. PERRLA, EOMI. Cards: S1 S2 normal, no murmur, occasional skipped beats. 2/6 systolic murmur. Resp: Air entry equal and CTA B/L. Abd: Soft, NT/ND, BS+, and normal. Ext: +++ pedal swelling of her right LL from the ankle up to few inches below her knee. + pedal swelling of her left LL.  A/P: DM2: A1C pending. Will defer management plan and medication adjustment to her PCP pending A1C result.  HTN: BP elevated despite compliance with her home regimen. Add Lasix 20 mg QD to meds.  Pedal swelling and SOB: ?? New-onset CHF without acute exacerbation. Symptoms remain the same over six months. Direct admission for evaluation and management recommended today, but she declined. The following plan discussed with Dr. Enid Derry Obtain EKG today. Schedule an urgent ECHO and urgent cardiology referral today. Obtain risk stratification labs: A1C, FLP, TSH, Bmet today. Give patient ED precautions. Start Lasix 20 mg QD and return in 1-3 days for BP check and pedal swelling reassessment.  Other chronic problems per her PCP.

## 2018-01-05 NOTE — Telephone Encounter (Signed)
Attempted to contact pt again- second VM left.

## 2018-01-06 ENCOUNTER — Other Ambulatory Visit: Payer: Self-pay

## 2018-01-06 ENCOUNTER — Ambulatory Visit (HOSPITAL_COMMUNITY): Admission: RE | Admit: 2018-01-06 | Payer: Medicare HMO | Source: Ambulatory Visit

## 2018-01-06 ENCOUNTER — Inpatient Hospital Stay (HOSPITAL_COMMUNITY): Payer: Medicare HMO

## 2018-01-06 ENCOUNTER — Emergency Department (HOSPITAL_COMMUNITY): Payer: Medicare HMO

## 2018-01-06 ENCOUNTER — Inpatient Hospital Stay (HOSPITAL_COMMUNITY)
Admission: EM | Admit: 2018-01-06 | Discharge: 2018-01-09 | DRG: 291 | Disposition: A | Discharge status: Home / Self Care | Payer: Medicare HMO | Attending: Family Medicine | Admitting: Family Medicine

## 2018-01-06 ENCOUNTER — Encounter (HOSPITAL_COMMUNITY): Payer: Self-pay | Admitting: Emergency Medicine

## 2018-01-06 DIAGNOSIS — D125 Benign neoplasm of sigmoid colon: Secondary | ICD-10-CM | POA: Diagnosis not present

## 2018-01-06 DIAGNOSIS — N183 Chronic kidney disease, stage 3 (moderate): Secondary | ICD-10-CM | POA: Diagnosis present

## 2018-01-06 DIAGNOSIS — N390 Urinary tract infection, site not specified: Secondary | ICD-10-CM | POA: Diagnosis present

## 2018-01-06 DIAGNOSIS — E119 Type 2 diabetes mellitus without complications: Secondary | ICD-10-CM

## 2018-01-06 DIAGNOSIS — R600 Localized edema: Secondary | ICD-10-CM | POA: Insufficient documentation

## 2018-01-06 DIAGNOSIS — K31819 Angiodysplasia of stomach and duodenum without bleeding: Secondary | ICD-10-CM | POA: Diagnosis not present

## 2018-01-06 DIAGNOSIS — K254 Chronic or unspecified gastric ulcer with hemorrhage: Secondary | ICD-10-CM | POA: Diagnosis present

## 2018-01-06 DIAGNOSIS — D500 Iron deficiency anemia secondary to blood loss (chronic): Secondary | ICD-10-CM | POA: Diagnosis not present

## 2018-01-06 DIAGNOSIS — Z90710 Acquired absence of both cervix and uterus: Secondary | ICD-10-CM | POA: Diagnosis not present

## 2018-01-06 DIAGNOSIS — K5909 Other constipation: Secondary | ICD-10-CM | POA: Diagnosis present

## 2018-01-06 DIAGNOSIS — Z809 Family history of malignant neoplasm, unspecified: Secondary | ICD-10-CM

## 2018-01-06 DIAGNOSIS — M7989 Other specified soft tissue disorders: Secondary | ICD-10-CM | POA: Diagnosis not present

## 2018-01-06 DIAGNOSIS — N179 Acute kidney failure, unspecified: Secondary | ICD-10-CM | POA: Diagnosis present

## 2018-01-06 DIAGNOSIS — E785 Hyperlipidemia, unspecified: Secondary | ICD-10-CM | POA: Diagnosis present

## 2018-01-06 DIAGNOSIS — D649 Anemia, unspecified: Secondary | ICD-10-CM | POA: Diagnosis not present

## 2018-01-06 DIAGNOSIS — E1122 Type 2 diabetes mellitus with diabetic chronic kidney disease: Secondary | ICD-10-CM | POA: Diagnosis present

## 2018-01-06 DIAGNOSIS — G4700 Insomnia, unspecified: Secondary | ICD-10-CM | POA: Diagnosis present

## 2018-01-06 DIAGNOSIS — R195 Other fecal abnormalities: Secondary | ICD-10-CM | POA: Diagnosis present

## 2018-01-06 DIAGNOSIS — K862 Cyst of pancreas: Secondary | ICD-10-CM | POA: Diagnosis present

## 2018-01-06 DIAGNOSIS — B9620 Unspecified Escherichia coli [E. coli] as the cause of diseases classified elsewhere: Secondary | ICD-10-CM | POA: Diagnosis present

## 2018-01-06 DIAGNOSIS — D509 Iron deficiency anemia, unspecified: Secondary | ICD-10-CM | POA: Diagnosis present

## 2018-01-06 DIAGNOSIS — Z8249 Family history of ischemic heart disease and other diseases of the circulatory system: Secondary | ICD-10-CM

## 2018-01-06 DIAGNOSIS — I130 Hypertensive heart and chronic kidney disease with heart failure and stage 1 through stage 4 chronic kidney disease, or unspecified chronic kidney disease: Secondary | ICD-10-CM | POA: Diagnosis present

## 2018-01-06 DIAGNOSIS — Z7982 Long term (current) use of aspirin: Secondary | ICD-10-CM | POA: Diagnosis not present

## 2018-01-06 DIAGNOSIS — Z833 Family history of diabetes mellitus: Secondary | ICD-10-CM | POA: Diagnosis not present

## 2018-01-06 DIAGNOSIS — I5083 High output heart failure: Secondary | ICD-10-CM | POA: Diagnosis present

## 2018-01-06 DIAGNOSIS — R160 Hepatomegaly, not elsewhere classified: Secondary | ICD-10-CM | POA: Diagnosis present

## 2018-01-06 DIAGNOSIS — K253 Acute gastric ulcer without hemorrhage or perforation: Secondary | ICD-10-CM | POA: Diagnosis not present

## 2018-01-06 DIAGNOSIS — I5030 Unspecified diastolic (congestive) heart failure: Secondary | ICD-10-CM | POA: Diagnosis present

## 2018-01-06 DIAGNOSIS — F17210 Nicotine dependence, cigarettes, uncomplicated: Secondary | ICD-10-CM | POA: Diagnosis present

## 2018-01-06 DIAGNOSIS — Z794 Long term (current) use of insulin: Secondary | ICD-10-CM

## 2018-01-06 DIAGNOSIS — B9681 Helicobacter pylori [H. pylori] as the cause of diseases classified elsewhere: Secondary | ICD-10-CM | POA: Diagnosis present

## 2018-01-06 DIAGNOSIS — I272 Pulmonary hypertension, unspecified: Secondary | ICD-10-CM | POA: Diagnosis present

## 2018-01-06 DIAGNOSIS — J439 Emphysema, unspecified: Secondary | ICD-10-CM | POA: Diagnosis present

## 2018-01-06 DIAGNOSIS — K8020 Calculus of gallbladder without cholecystitis without obstruction: Secondary | ICD-10-CM | POA: Diagnosis present

## 2018-01-06 DIAGNOSIS — D123 Benign neoplasm of transverse colon: Secondary | ICD-10-CM | POA: Diagnosis not present

## 2018-01-06 DIAGNOSIS — I361 Nonrheumatic tricuspid (valve) insufficiency: Secondary | ICD-10-CM

## 2018-01-06 DIAGNOSIS — I509 Heart failure, unspecified: Secondary | ICD-10-CM

## 2018-01-06 LAB — CBC WITH DIFFERENTIAL/PLATELET
Basophils Absolute: 0 10*3/uL (ref 0.0–0.2)
Basos: 0 %
EOS (ABSOLUTE): 0.1 10*3/uL (ref 0.0–0.4)
Eos: 1 %
Hematocrit: 16 % — CL (ref 34.0–46.6)
Hemoglobin: 4.6 g/dL — CL (ref 11.1–15.9)
Immature Grans (Abs): 0 10*3/uL (ref 0.0–0.1)
Immature Granulocytes: 0 %
Lymphocytes Absolute: 2 10*3/uL (ref 0.7–3.1)
Lymphs: 21 %
MCH: 23.8 pg — ABNORMAL LOW (ref 26.6–33.0)
MCHC: 28.8 g/dL — ABNORMAL LOW (ref 31.5–35.7)
MCV: 83 fL (ref 79–97)
Monocytes Absolute: 0.5 10*3/uL (ref 0.1–0.9)
Monocytes: 6 %
Neutrophils Absolute: 6.7 10*3/uL (ref 1.4–7.0)
Neutrophils: 72 %
Platelets: 407 10*3/uL (ref 150–450)
RBC: 1.93 x10E6/uL — CL (ref 3.77–5.28)
RDW: 15.3 % (ref 12.3–15.4)
WBC: 9.3 10*3/uL (ref 3.4–10.8)

## 2018-01-06 LAB — IRON AND TIBC
Iron: 8 ug/dL — ABNORMAL LOW (ref 28–170)
Saturation Ratios: 2 % — ABNORMAL LOW (ref 10.4–31.8)
TIBC: 365 ug/dL (ref 250–450)
UIBC: 357 ug/dL

## 2018-01-06 LAB — COMPREHENSIVE METABOLIC PANEL
A/G RATIO: 1.8 (ref 1.2–2.2)
ALT: 9 IU/L (ref 0–32)
AST: 10 IU/L (ref 0–40)
Albumin: 3.6 g/dL (ref 3.6–4.8)
Alkaline Phosphatase: 87 IU/L (ref 39–117)
BUN/Creatinine Ratio: 10 — ABNORMAL LOW (ref 12–28)
BUN: 23 mg/dL (ref 8–27)
Bilirubin Total: <0.2 mg/dL (ref 0.0–1.2)
CALCIUM: 9.6 mg/dL (ref 8.7–10.3)
CO2: 18 mmol/L — ABNORMAL LOW (ref 20–29)
CREATININE: 2.26 mg/dL — AB (ref 0.57–1.00)
Chloride: 111 mmol/L — ABNORMAL HIGH (ref 96–106)
GFR calc Af Amer: 25 mL/min/{1.73_m2} — ABNORMAL LOW (ref >59)
GFR, EST NON AFRICAN AMERICAN: 22 mL/min/{1.73_m2} — AB (ref >59)
GLUCOSE: 173 mg/dL — AB (ref 65–99)
Globulin, Total: 2 g/dL (ref 1.5–4.5)
POTASSIUM: 4.4 mmol/L (ref 3.5–5.2)
Sodium: 142 mmol/L (ref 134–144)
Total Protein: 5.6 g/dL — ABNORMAL LOW (ref 6.0–8.5)

## 2018-01-06 LAB — URINALYSIS, ROUTINE W REFLEX MICROSCOPIC
Bilirubin Urine: NEGATIVE
GLUCOSE, UA: 50 mg/dL — AB
Ketones, ur: NEGATIVE mg/dL
Nitrite: NEGATIVE
Protein, ur: 100 mg/dL — AB
Specific Gravity, Urine: 1.013 (ref 1.005–1.030)
WBC, UA: >50 WBC/hpf — ABNORMAL HIGH (ref 0–5)
pH: 5 (ref 5.0–8.0)

## 2018-01-06 LAB — MICROALBUMIN / CREATININE URINE RATIO
Creatinine, Urine: 95.5 mg/dL
Microalb/Creat Ratio: 3348.2 mg/g creat — ABNORMAL HIGH (ref 0.0–30.0)
Microalbumin, Urine: 3197.5 ug/mL

## 2018-01-06 LAB — TSH: TSH: 0.982 u[IU]/mL (ref 0.450–4.500)

## 2018-01-06 LAB — CBC
HCT: 16.5 % — ABNORMAL LOW (ref 36.0–46.0)
Hemoglobin: 4.5 g/dL — CL (ref 12.0–15.0)
MCH: 22.8 pg — ABNORMAL LOW (ref 26.0–34.0)
MCHC: 27.3 g/dL — ABNORMAL LOW (ref 30.0–36.0)
MCV: 83.8 fL (ref 80.0–100.0)
Platelets: 362 10*3/uL (ref 150–400)
RBC: 1.97 MIL/uL — AB (ref 3.87–5.11)
RDW: 16 % — ABNORMAL HIGH (ref 11.5–15.5)
WBC: 8.3 10*3/uL (ref 4.0–10.5)
nRBC: 0 % (ref 0.0–0.2)

## 2018-01-06 LAB — TROPONIN I
TROPONIN I: 0.03 ng/mL — AB (ref <0.03)
Troponin I: 0.03 ng/mL (ref <0.03)

## 2018-01-06 LAB — FOLATE: Folate: 8 ng/mL (ref >5.9)

## 2018-01-06 LAB — PREPARE RBC (CROSSMATCH)

## 2018-01-06 LAB — HEMOGLOBIN AND HEMATOCRIT, BLOOD
HCT: 25.4 % — ABNORMAL LOW (ref 36.0–46.0)
Hemoglobin: 7.6 g/dL — ABNORMAL LOW (ref 12.0–15.0)

## 2018-01-06 LAB — BASIC METABOLIC PANEL
ANION GAP: 10 (ref 5–15)
BUN: 26 mg/dL — ABNORMAL HIGH (ref 8–23)
CO2: 21 mmol/L — ABNORMAL LOW (ref 22–32)
Calcium: 9.3 mg/dL (ref 8.9–10.3)
Chloride: 111 mmol/L (ref 98–111)
Creatinine, Ser: 2.36 mg/dL — ABNORMAL HIGH (ref 0.44–1.00)
GFR calc Af Amer: 24 mL/min — ABNORMAL LOW (ref >60)
GFR calc non Af Amer: 21 mL/min — ABNORMAL LOW (ref >60)
Glucose, Bld: 195 mg/dL — ABNORMAL HIGH (ref 70–99)
POTASSIUM: 3.7 mmol/L (ref 3.5–5.1)
Sodium: 142 mmol/L (ref 135–145)

## 2018-01-06 LAB — HEMOGLOBIN A1C
ESTIMATED AVERAGE GLUCOSE: 143 mg/dL
Hgb A1c MFr Bld: 6.6 % — ABNORMAL HIGH (ref 4.8–5.6)

## 2018-01-06 LAB — ABO/RH: ABO/RH(D): O POS

## 2018-01-06 LAB — RETICULOCYTES
Immature Retic Fract: 18.7 % — ABNORMAL HIGH (ref 2.3–15.9)
RBC.: 1.87 MIL/uL — ABNORMAL LOW (ref 3.87–5.11)
Retic Count, Absolute: 58 10*3/uL (ref 19.0–186.0)
Retic Ct Pct: 3.1 % (ref 0.4–3.1)

## 2018-01-06 LAB — ECHOCARDIOGRAM COMPLETE

## 2018-01-06 LAB — VITAMIN B12: Vitamin B-12: 223 pg/mL (ref 180–914)

## 2018-01-06 LAB — GLUCOSE, CAPILLARY
GLUCOSE-CAPILLARY: 68 mg/dL — AB (ref 70–99)
Glucose-Capillary: 77 mg/dL (ref 70–99)
Glucose-Capillary: 115 mg/dL — ABNORMAL HIGH (ref 70–99)

## 2018-01-06 LAB — POC OCCULT BLOOD, ED: Fecal Occult Bld: NEGATIVE

## 2018-01-06 LAB — CBG MONITORING, ED: Glucose-Capillary: 207 mg/dL — ABNORMAL HIGH (ref 70–99)

## 2018-01-06 LAB — BRAIN NATRIURETIC PEPTIDE
B Natriuretic Peptide: 1097.5 pg/mL — ABNORMAL HIGH (ref 0.0–100.0)
BNP: 913 pg/mL — ABNORMAL HIGH (ref 0.0–100.0)

## 2018-01-06 LAB — MRSA PCR SCREENING: MRSA by PCR: NEGATIVE

## 2018-01-06 LAB — FERRITIN: Ferritin: 5 ng/mL — ABNORMAL LOW (ref 11–307)

## 2018-01-06 MED ORDER — BISACODYL 5 MG PO TBEC
20.0000 mg | DELAYED_RELEASE_TABLET | Freq: Once | ORAL | Status: AC
  Administered 2018-01-06: 20 mg via ORAL
  Filled 2018-01-06: qty 4

## 2018-01-06 MED ORDER — SODIUM CHLORIDE 0.9 % IV SOLN
1.0000 g | Freq: Once | INTRAVENOUS | Status: AC
  Administered 2018-01-06: 1 g via INTRAVENOUS
  Filled 2018-01-06: qty 10

## 2018-01-06 MED ORDER — METOCLOPRAMIDE HCL 5 MG/ML IJ SOLN
10.0000 mg | Freq: Once | INTRAMUSCULAR | Status: AC
  Administered 2018-01-06: 10 mg via INTRAVENOUS
  Filled 2018-01-06: qty 2

## 2018-01-06 MED ORDER — PEG-KCL-NACL-NASULF-NA ASC-C 100 G PO SOLR: 1.0000 | Freq: Once | ORAL | Status: DC

## 2018-01-06 MED ORDER — NICOTINE 14 MG/24HR TD PT24
14.0000 mg | MEDICATED_PATCH | Freq: Every day | TRANSDERMAL | Status: DC
  Administered 2018-01-07 – 2018-01-09 (×3): 14 mg via TRANSDERMAL
  Filled 2018-01-06 (×4): qty 1

## 2018-01-06 MED ORDER — ACETAMINOPHEN 650 MG RE SUPP: 650.0000 mg | Freq: Four times a day (QID) | RECTAL | Status: DC | PRN

## 2018-01-06 MED ORDER — FUROSEMIDE 10 MG/ML IJ SOLN
20.0000 mg | Freq: Once | INTRAMUSCULAR | Status: AC
  Administered 2018-01-06: 20 mg via INTRAVENOUS
  Filled 2018-01-06: qty 2

## 2018-01-06 MED ORDER — PEG-KCL-NACL-NASULF-NA ASC-C 100 G PO SOLR
0.5000 | Freq: Once | ORAL | Status: AC
  Administered 2018-01-07: 100 g via ORAL
  Filled 2018-01-06: qty 1

## 2018-01-06 MED ORDER — POLYETHYLENE GLYCOL 3350 17 G PO PACK: 17.0000 g | PACK | Freq: Every day | ORAL | Status: DC | PRN

## 2018-01-06 MED ORDER — CARVEDILOL 12.5 MG PO TABS
12.5000 mg | ORAL_TABLET | Freq: Two times a day (BID) | ORAL | Status: DC
  Administered 2018-01-06 (×2): 12.5 mg via ORAL
  Filled 2018-01-06 (×2): qty 1

## 2018-01-06 MED ORDER — SODIUM CHLORIDE 0.9% IV SOLUTION
Freq: Once | INTRAVENOUS | Status: AC
  Administered 2018-01-06: 06:00:00 via INTRAVENOUS

## 2018-01-06 MED ORDER — ATORVASTATIN CALCIUM 10 MG PO TABS
20.0000 mg | ORAL_TABLET | Freq: Every day | ORAL | Status: DC
  Administered 2018-01-06 – 2018-01-07 (×2): 20 mg via ORAL
  Filled 2018-01-06 (×3): qty 2
  Filled 2018-01-06: qty 1

## 2018-01-06 MED ORDER — METOCLOPRAMIDE HCL 5 MG/ML IJ SOLN
10.0000 mg | Freq: Once | INTRAMUSCULAR | Status: AC
  Administered 2018-01-07: 10 mg via INTRAVENOUS
  Filled 2018-01-06: qty 2

## 2018-01-06 MED ORDER — INSULIN ASPART 100 UNIT/ML ~~LOC~~ SOLN
0.0000 [IU] | Freq: Three times a day (TID) | SUBCUTANEOUS | Status: DC
  Administered 2018-01-06: 3 [IU] via SUBCUTANEOUS
  Administered 2018-01-08: 5 [IU] via SUBCUTANEOUS
  Administered 2018-01-08: 3 [IU] via SUBCUTANEOUS
  Administered 2018-01-08: 1 [IU] via SUBCUTANEOUS
  Administered 2018-01-09: 3 [IU] via SUBCUTANEOUS
  Administered 2018-01-09: 2 [IU] via SUBCUTANEOUS

## 2018-01-06 MED ORDER — PEG-KCL-NACL-NASULF-NA ASC-C 100 G PO SOLR
0.5000 | Freq: Once | ORAL | Status: AC
  Administered 2018-01-06: 100 g via ORAL
  Filled 2018-01-06 (×2): qty 1

## 2018-01-06 MED ORDER — ACETAMINOPHEN 325 MG PO TABS: 650.0000 mg | ORAL_TABLET | Freq: Four times a day (QID) | ORAL | Status: DC | PRN

## 2018-01-06 NOTE — Telephone Encounter (Signed)
Patient currently admitted- will close note.

## 2018-01-06 NOTE — ED Provider Notes (Signed)
[facility] EMERGENCY DEPARTMENT Provider Note   CSN: [id number] Arrival date & time: 01/06/18  0408     History   Chief Complaint Chief Complaint  Patient presents with  . Dizziness    HPI Sara Davis is a 67 y.o. female.  HPI 67 year old female with past medical history of diabetes and hypertension here with generalized weakness and dizziness.  Patient states that over the last several weeks, she has noticed that every time she stands up and tries to walk, she feels very dizzy and off balance.  She had associated generalized fatigue, loss of appetite, and generalized weakness.  She saw her PCP recently and is scheduled for an echocardiogram today.  She states that over the last several days, she is also noticed bilateral lower extremity edema.  She was reportedly told by her PCP that she had a new murmur as well.  Denies any pain.  She does note intermittent dark stools.  Denies any heavy NSAID or alcohol use.  Does not take aspirin daily.  She currently feels fine when resting, but becomes what she describes as dizzy when walking.  On further discussion, this sounds more like lightheadedness and dizziness that resolves when sitting down.  No actual ataxia or focal deficits.  No difficulty swallowing or changes in speech.  Past Medical History:  Diagnosis Date  . Cellulitis of scalp 02/2015  . Depression   . Diabetes mellitus without complication ([place])   . Hypertension     Patient Active Problem List   Diagnosis Date Noted  . Pancreatic cyst 12/03/2016  . Anemia 12/03/2016  . AKI (acute kidney injury) ([place])   . Protein-calorie malnutrition, severe 11/25/2016  . Chest pain   . Hypertensive urgency   . Onychomycosis 07/30/2015  . Essential hypertension   . Headache 04/18/2014  . Pain of left great toe 01/19/2014  . Insomnia 07/31/2013  . Uncontrolled type 2 diabetes mellitus ([place]) 01/14/2012  . Tobacco abuse 01/14/2012    Past Surgical History:    Procedure Laterality Date  . ABDOMINAL HYSTERECTOMY       OB History   None      Home Medications    Prior to Admission medications   Medication Sig Start Date End Date Taking? Authorizing Provider  acetaminophen (TYLENOL) 500 MG tablet Take 1,000 mg by mouth every 6 (six) hours as needed for mild pain or fever.   Yes [provider]  aspirin 81 MG tablet Take 1 tablet (81 mg total) by mouth daily. 05/06/17  Yes Smiley Houseman, MD  atorvastatin (LIPITOR) 20 MG tablet TAKE 1 TABLET(20 MG) BY MOUTH DAILY AT 6 PM Patient taking differently: Take 20 mg by mouth daily.  05/07/17  Yes Smiley Houseman, MD  Blood Glucose Monitoring Suppl (CONTOUR BLOOD GLUCOSE SYSTEM) DEVI Use to test blood sugars once in the morning before food and once in the evening.DX: 250.02 10/17/14  Yes Smiley Houseman, MD  carvedilol (COREG) 12.5 MG tablet TAKE 1 TABLET(12.5 MG) BY MOUTH TWICE DAILY WITH A MEAL Patient taking differently: Take 12.5 mg by mouth 2 (two) times daily.  05/07/17  Yes Smiley Houseman, MD  glucose blood (ONE TOUCH TEST STRIPS) test strip Please check blood sugar once daily in AM before eating and once daily in PM 10/17/14  Yes Smiley Houseman, MD  insulin NPH-regular Human (NOVOLIN 70/30) (70-30) 100 UNIT/ML injection Inject 14 Units into the skin 3 (three) times daily with meals. Patient taking differently:  Inject 20 Units into the skin 2 (two) times daily.  05/06/17  Yes Smiley Houseman, MD  Insulin Pen Needle (B-D ULTRAFINE III SHORT PEN) 31G X 8 MM MISC CHECK BLOOD SUGARS ONCE IN MORNING BEFORE FOOD AND ONCE IN THE EVENING. 10/17/14  Yes Smiley Houseman, MD  Lancets [facility] ULTRASOFT) lancets Please check blood sugar once daily in AM before eating and once daily in PM 06/28/12  Yes Cletus Gash, MD  metFORMIN (GLUCOPHAGE) 1000 MG tablet Take 1 tablet (1,000 mg total) by mouth 2 (two) times daily with a meal. 05/06/17  Yes Smiley Houseman, MD   ferrous sulfate 325 (65 FE) MG tablet Take 1 tablet (325 mg total) by mouth daily with breakfast. Patient not taking: Reported on 01/06/2018 05/24/17   Smiley Houseman, MD  furosemide (LASIX) 20 MG tablet Take 1 tablet (20 mg total) by mouth daily. Patient not taking: Reported on 01/06/2018 01/05/18   [name], [name], DO    Family History Family History  Problem Relation Age of Onset  . Diabetes Mother   . Cancer Mother   . Diabetes Sister   . Hypertension Sister   . Cancer Sister 78       Bone  . Diabetes Brother   . Diabetes Sister   . Diabetes Sister     Social History Social History   Tobacco Use  . Smoking status: Current Every Day Smoker    Packs/day: 0.50    Years: 40.00    Pack years: 20.00    Types: Cigarettes  . Smokeless tobacco: Never Used  . Tobacco comment: down from 1.5 ppd  Substance Use Topics  . Alcohol use: No    Alcohol/week: 0.0 standard drinks  . Drug use: No     Allergies   Patient has no known allergies.   Review of Systems Review of Systems  Constitutional: Positive for fatigue. Negative for chills and fever.  HENT: Negative for congestion and rhinorrhea.   Eyes: Negative for visual disturbance.  Respiratory: Positive for shortness of breath. Negative for cough and wheezing.   Cardiovascular: Positive for leg swelling. Negative for chest pain.  Gastrointestinal: Negative for abdominal pain, diarrhea, nausea and vomiting.  Genitourinary: Negative for dysuria and flank pain.  Musculoskeletal: Negative for neck pain and neck stiffness.  Skin: Negative for rash and wound.  Allergic/Immunologic: Negative for immunocompromised state.  Neurological: Positive for light-headedness. Negative for syncope, weakness and headaches.  All other systems reviewed and are negative.    Physical Exam Updated Vital Signs BP (!) 149/85   Pulse 84   Temp 97.7 F (36.5 C) (Oral)   Resp 19   SpO2 99%   Physical Exam  Constitutional: She is  oriented to person, place, and time. She appears well-developed and well-nourished. No distress.  HENT:  Head: Normocephalic and atraumatic.  Eyes: Conjunctivae are normal.  Neck: Neck supple. JVD present.  Cardiovascular: Normal rate, regular rhythm and normal heart sounds. Exam reveals no friction rub.  No murmur heard. Pulmonary/Chest: Effort normal and breath sounds normal. No respiratory distress. She has no wheezes. She has no rales.  Abdominal: Soft. Bowel sounds are normal. She exhibits no distension.  Genitourinary:  Genitourinary Comments: Brown stool in rectal vault.  No overt bleeding.  Musculoskeletal: She exhibits no edema.  Neurological: She is alert and oriented to person, place, and time. She exhibits normal muscle tone.  Skin: Skin is warm. Capillary refill takes less than 2 seconds. There is pallor.  Psychiatric:  She has a normal mood and affect.  Nursing note and vitals reviewed.   Neurological Exam:  Mental Status: Alert and oriented to person, place, and time. Attention and concentration normal. Speech clear. Recent memory is intact. Cranial Nerves: Visual fields grossly intact. EOMI and PERRLA. No nystagmus noted. Facial sensation intact at forehead, maxillary cheek, and chin/mandible bilaterally. No facial asymmetry or weakness. Hearing grossly normal. Uvula is midline, and palate elevates symmetrically. Normal SCM and trapezius strength. Tongue midline without fasciculations. Motor: Muscle strength 5/5 in proximal and distal UE and LE bilaterally. No pronator drift. Muscle tone normal. Reflexes: 2+ and symmetrical in all four extremities.  Sensation: Intact to light touch in upper and lower extremities distally bilaterally.  Gait: Normal without ataxia. Coordination: Normal FTN bilaterally.    ED Treatments / Results  Labs (all labs ordered are listed, but only abnormal results are displayed) Labs Reviewed  BASIC METABOLIC PANEL - Abnormal; Notable for the  following components:      Result Value   CO2 21 (*)    Glucose, Bld 195 (*)    BUN 26 (*)    Creatinine, Ser 2.36 (*)    GFR calc non Af Amer 21 (*)    GFR calc Af Amer 24 (*)    All other components within normal limits  CBC - Abnormal; Notable for the following components:   RBC 1.97 (*)    Hemoglobin 4.5 (*)    HCT 16.5 (*)    MCH 22.8 (*)    MCHC 27.3 (*)    RDW 16.0 (*)    All other components within normal limits  BRAIN NATRIURETIC PEPTIDE - Abnormal; Notable for the following components:   B Natriuretic Peptide 1,097.5 (*)    All other components within normal limits  TROPONIN I - Abnormal; Notable for the following components:   Troponin I 0.03 (*)    All other components within normal limits  URINALYSIS, ROUTINE W REFLEX MICROSCOPIC  VITAMIN B12  FOLATE  IRON AND TIBC  FERRITIN  RETICULOCYTES  POC OCCULT BLOOD, ED  PREPARE RBC (CROSSMATCH)  TYPE AND SCREEN  ABO/RH    EKG EKG Interpretation  Date/Time:  Thursday January 06 2018 04:38:45 EST Ventricular Rate:  82 PR Interval:    QRS Duration: 105 QT Interval:  425 QTC Calculation: 497 R Axis:   31 Text Interpretation:  Sinus rhythm Borderline repolarization abnormality Borderline prolonged QT interval No significant change since last tracing Confirmed by Duffy Bruce [phone]) on 01/06/2018 6:38:31 AM   Radiology Dg Chest 2 View  Result Date: 01/06/2018 CLINICAL DATA:  67 year old female with dizziness and shortness of breath. EXAM: CHEST - 2 VIEW COMPARISON:  Chest CT dated 11/24/2016 FINDINGS: There is slight hyperexpansion of the lungs and flattening of the diaphragms likely related to underlying air trapping and emphysema. No focal consolidation, pleural effusion, or pneumothorax. The cardiac silhouette is within normal limits. No acute osseous pathology. IMPRESSION: No active cardiopulmonary disease. Emphysema. Electronically Signed   By: Anner Crete M.D.   On: 01/06/2018 05:11   Ct Head Wo  Contrast  Result Date: 01/06/2018 CLINICAL DATA:  67 year old female with dizziness and generalized weakness. EXAM: CT HEAD WITHOUT CONTRAST TECHNIQUE: Contiguous axial images were obtained from the base of the skull through the vertex without intravenous contrast. COMPARISON:  Head CT dated 02/22/2015 FINDINGS: Brain: The ventricles and sulci appropriate size for patient's age. The gray-white matter discrimination is preserved. There is no acute intracranial hemorrhage. No mass effect or  midline shift. No extra-axial fluid collection. Vascular: No hyperdense vessel or unexpected calcification. Skull: Normal. Negative for fracture or focal lesion. Sinuses/Orbits: No acute finding. Other: None IMPRESSION: No acute intracranial pathology. Electronically Signed   By: Anner Crete M.D.   On: 01/06/2018 05:30    Procedures .Critical Care Performed by: Duffy Bruce, MD Authorized by: Duffy Bruce, MD   Critical care provider statement:    Critical care time (minutes):  35   Critical care time was exclusive of:  Separately billable procedures and treating other patients and teaching time   Critical care was necessary to treat or prevent imminent or life-threatening deterioration of the following conditions:  Cardiac failure and circulatory failure   Critical care was time spent personally by me on the following activities:  Development of treatment plan with patient or surrogate, discussions with consultants, evaluation of patient's response to treatment, examination of patient, obtaining history from patient or surrogate, ordering and performing treatments and interventions, ordering and review of laboratory studies, ordering and review of radiographic studies, pulse oximetry, re-evaluation of patient's condition and review of old charts   I assumed direction of critical care for this patient from another provider in my specialty: no     (including critical care time)  Medications Ordered in  ED Medications  0.9 %  sodium chloride infusion (Manually program via Guardrails IV Fluids) ( Intravenous New Bag/Given 01/06/18 0553)  furosemide (LASIX) injection 20 mg (20 mg Intravenous Given 01/06/18 0600)     Initial Impression / Assessment and Plan / ED Course  I have reviewed the triage vital signs and the nursing notes.  Pertinent labs & imaging results that were available during my care of the patient were reviewed by me and considered in my medical decision making (see chart for details).     67 year old female here with dizziness with exertion.  On exam, patient has bounding carotid pulse with JVD and 2+ pitting edema.  Lab work is concerning for new onset, high-output cardiac failure secondary to profound anemia.  MCV is 84 but RDW elevated and clinically, concern for iron deficiency anemia with intermittent melena.  Hemoccult pending.  Will admit. Transfuse 3u PRBC. May need lasix between doses.  Final Clinical Impressions(s) / ED Diagnoses   Final diagnoses:  Symptomatic anemia  High output heart failure [facility])    ED Discharge Orders    None       Duffy Bruce, MD 01/06/18 [phone]

## 2018-01-06 NOTE — ED Notes (Signed)
Patient arrived eating a food tray.

## 2018-01-06 NOTE — Consult Note (Addendum)
[facility] Consult: 10:10 AM 01/06/2018  LOS: 0 days    Referring Provider: Dr Ardelia Mems of [facility] teaching service  Primary Care Physician:  [name], [name], DO Primary Gastroenterologist:  unassigned   Reason for Consultation:  Iron def anemia, occasional dark stools, currently FOBT negative.     HPI: Sara Davis is a 67 y.o. female.  PMH HTN.  Poorly controlled DM 2, insulin requiring. Emphysema. Stage 3 CKD.  Mildly enlarged liver, GB stone, pancreatic atrophy, 6 mm pancreatic cyst, small umbilical hernia on CT 74/0814.  The cyst was not followed up with MRI or GI referral.  Remote hysterectomy for uterine fibroids and anemia requiring po iron but not transfusion.   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  Generally avoids MDs when possible.  Has never followed up on suggestion for screening colonoscopy.   Several months of LE edema, SOB, 3 pillow orthopnea, LE edema, fatigue, dizziness, loss of appetite, wt down 10 to 20 # from previous years.  Has constipation for many years, occasional formed, black stools not obviously bloody.  No abd pain. No N/V.  3 weeks of cough, nasal congestion.  Not using NSAIDS.  No ASA except that contained in [employer], only 2 doses of this in last 3 weeks.  No ETOH.    Presented to ED with SOB etc.   Hgb is 4.5, baseline in 05/2017: 10.3.  MCV 83.    Ferritin 5.  Low Iron and iron saturation.  B12 and Folate ok.  BNP 1097.  Troponin 0.03 + AKI.   + hypertension.  No fever Emphysema, no pulm edema on CXR 3 Units PRBCs ordered 1 completed.   Smokes 1/2 PPD, down from 2 PPD.     Past Medical History:  Diagnosis Date  . Cellulitis of scalp 02/2015  . Depression   . Diabetes mellitus without complication ([place])   . Hypertension     Past Surgical  History:  Procedure Laterality Date  . ABDOMINAL HYSTERECTOMY      Prior to Admission medications   Medication Sig Start Date End Date Taking? Authorizing Provider  acetaminophen (TYLENOL) 500 MG tablet Take 1,000 mg by mouth every 6 (six) hours as needed for mild pain or fever.   Yes [provider]  aspirin 81 MG tablet Take 1 tablet (81 mg total) by mouth daily. 05/06/17  Yes Smiley Houseman, MD  atorvastatin (LIPITOR) 20 MG tablet TAKE 1 TABLET(20 MG) BY MOUTH DAILY AT 6 PM Patient taking differently: Take 20 mg by mouth daily.  05/07/17  Yes Smiley Houseman, MD  Blood Glucose Monitoring Suppl (CONTOUR BLOOD GLUCOSE SYSTEM) DEVI Use to test blood sugars once in the morning before food and once in the evening.DX: 250.02 10/17/14  Yes Smiley Houseman, MD  carvedilol (COREG) 12.5 MG tablet TAKE 1 TABLET(12.5 MG) BY MOUTH TWICE DAILY WITH A MEAL Patient taking differently: Take 12.5 mg by mouth 2 (two) times daily.  05/07/17  Yes Smiley Houseman, MD  glucose blood (ONE  TOUCH TEST STRIPS) test strip Please check blood sugar once daily in AM before eating and once daily in PM 10/17/14  Yes Gunadasa, Almond Lint, MD  insulin NPH-regular Human (NOVOLIN 70/30) (70-30) 100 UNIT/ML injection Inject 14 Units into the skin 3 (three) times daily with meals. Patient taking differently: Inject 20 Units into the skin 2 (two) times daily.  05/06/17  Yes Smiley Houseman, MD  Insulin Pen Needle (B-D ULTRAFINE III SHORT PEN) 31G X 8 MM MISC CHECK BLOOD SUGARS ONCE IN MORNING BEFORE FOOD AND ONCE IN THE EVENING. 10/17/14  Yes Smiley Houseman, MD  Lancets [facility] ULTRASOFT) lancets Please check blood sugar once daily in AM before eating and once daily in PM 06/28/12  Yes Cletus Gash, MD  metFORMIN (GLUCOPHAGE) 1000 MG tablet Take 1 tablet (1,000 mg total) by mouth 2 (two) times daily with a meal. 05/06/17  Yes Smiley Houseman, MD  ferrous sulfate 325 (65 FE) MG tablet Take 1  tablet (325 mg total) by mouth daily with breakfast. Patient not taking: Reported on 01/06/2018 05/24/17   Smiley Houseman, MD  furosemide (LASIX) 20 MG tablet Take 1 tablet (20 mg total) by mouth daily. Patient not taking: Reported on 01/06/2018 01/05/18   [name], [name], DO    Scheduled Meds: . nicotine  14 mg Transdermal Daily   Infusions: . cefTRIAXone (ROCEPHIN)  IV     PRN Meds:    Allergies as of 01/06/2018  . (No Known Allergies)    Family History  Problem Relation Age of Onset  . Diabetes Mother   . Cancer Mother   . Diabetes Sister   . Hypertension Sister   . Cancer Sister 70       Bone  . Diabetes Brother   . Diabetes Sister   . Diabetes Sister     Social History   Socioeconomic History  . Marital status: Single    Spouse name: Not on file  . Number of children: Not on file  . Years of education: Not on file  . Highest education level: Not on file  Occupational History  . Not on file  Social Needs  . Financial resource strain: Not on file  . Food insecurity:    Worry: Not on file    Inability: Not on file  . Transportation needs:    Medical: Not on file    Non-medical: Not on file  Tobacco Use  . Smoking status: Current Every Day Smoker    Packs/day: 0.50    Years: 40.00    Pack years: 20.00    Types: Cigarettes  . Smokeless tobacco: Never Used  . Tobacco comment: down from 1.5 ppd  Substance and Sexual Activity  . Alcohol use: No    Alcohol/week: 0.0 standard drinks  . Drug use: No  . Sexual activity: Not on file  Lifestyle  . Physical activity:    Days per week: Not on file    Minutes per session: Not on file  . Stress: Not on file  Relationships  . Social connections:    Talks on phone: Not on file    Gets together: Not on file    Attends religious service: Not on file    Active member of club or organization: Not on file    Attends meetings of clubs or organizations: Not on file    Relationship status: Not on file  .  Intimate partner violence:    Fear of current or ex partner: Not  on file    Emotionally abused: Not on file    Physically abused: Not on file    Forced sexual activity: Not on file  Other Topics Concern  . Not on file  Social History Narrative  . Not on file    REVIEW OF SYSTEMS: Constitutional:  Weakness fatigue.   ENT:  No nose bleeds Pulm:  Per HPI CV:  No palpitations, no LE edema.  GU:  No hematuria, no frequency GI:  Per HPI Heme:  No unusual bleeding or bruising   Transfusions:  None ever until today.   Neuro:  No headaches.  Some tingling and cold feelling in feet Psych: insomnia.   Derm:  Had shingles on her back several months ago, no neuralgia in the area.    Endocrine: sugars as high as 300s, low as 60s at home.  Not taking SSI.  No sweats or chills.  No polyuria or dysuria Immunization:  Not queried.  No record of flu shot for this year Travel:  None beyond local counties in last few months.    PHYSICAL EXAM: Vital signs in last 24 hours: Vitals:   01/06/18 0845 01/06/18 1002  BP: (!) 167/85 (!) 196/77  Pulse: 81 86  Resp: 15 13  Temp:  98 F (36.7 C)  SpO2: 100% 95%   Wt Readings from Last 3 Encounters:  01/05/18 67.6 kg  05/06/17 68.7 kg  01/06/17 71.2 kg    General: Chronically ill appearing.  Looks older than stated age.  Looks tired. Head: Facial asymmetry or swelling.  No signs of head trauma. Eyes: Scleral icterus.  Conjunctiva.  EOMI. Ears: Not hard of hearing. Nose: No discharge or congestion. Mouth: Oral mucosa and tongue somewhat dry.  Tongue midline.  Three quarters of her teeth missing, only 8 or 10 front lower teeth pain.  Her dentures are at home. Neck: No JVD, no masses, no thyromegaly. Lungs: Reduced breath sounds.  No cough.  No labored breathing. Heart: RRR.  No MRG.  S1, S2 present. Abdomen: Soft.  Not tender.  No organomegaly, bruits, masses, hernias.  Active bowel sounds.  Bulging flanks or obvious intra-abdominal fluid.     Rectal: Performed by ED staff, stool brown, FOBT negative. Musc/Skeltl: No joint redness or swelling. Extremities: Bilateral lower extremity edema more prominent in the right lower extremity where there is pitting. Neurologic: Oriented x3.  Appropriate.  Moves all 4 limbs, strength.  Tremors. Skin: Multiple healed, flat, hyperpigmented lesions, scars on her back from her bout of shingles.  Multiple healed scars on her legs Tattoos: None Nodes: No cervical. Psych: Pleasant, cooperative, laconic  Intake/Output from previous day: No intake/output data recorded. Intake/Output this shift: Total I/O In: 330 [Blood:330] Out: -   LAB RESULTS: Recent Labs    01/05/18 1206 01/06/18 0427  WBC WILL FOLLOW 8.3  HGB WILL FOLLOW 4.5*  HCT WILL FOLLOW 16.5*  PLT WILL FOLLOW 362   BMET Lab Results  Component Value Date   NA 142 01/06/2018   NA 142 01/05/2018   NA 143 05/06/2017   K 3.7 01/06/2018   K 4.4 01/05/2018   K 4.5 05/06/2017   CL 111 01/06/2018   CL 111 (H) 01/05/2018   CL 108 (H) 05/06/2017   CO2 21 (L) 01/06/2018   CO2 18 (L) 01/05/2018   CO2 20 05/06/2017   GLUCOSE 195 (H) 01/06/2018   GLUCOSE 173 (H) 01/05/2018   GLUCOSE 236 (H) 05/06/2017   BUN 26 (H) 01/06/2018  BUN 23 01/05/2018   BUN 22 05/06/2017   CREATININE 2.36 (H) 01/06/2018   CREATININE 2.26 (H) 01/05/2018   CREATININE 1.89 (H) 05/06/2017   CALCIUM 9.3 01/06/2018   CALCIUM 9.6 01/05/2018   CALCIUM 10.0 05/06/2017   LFT Recent Labs    01/05/18 1206  PROT 5.6*  ALBUMIN 3.6  AST 10  ALT 9  ALKPHOS 87  BILITOT <0.2   PT/INR No results found for: INR, PROTIME Hepatitis Panel No results for input(s): HEPBSAG, HCVAB, HEPAIGM, HEPBIGM in the last 72 hours. C-Diff No components found for: CDIFF Lipase     Component Value Date/Time   LIPASE 18 11/29/2016 1305    Drugs of Abuse  No results found for: LABOPIA, COCAINSCRNUR, LABBENZ, AMPHETMU, THCU, LABBARB   RADIOLOGY STUDIES: Dg Chest 2  View  Result Date: 01/06/2018 CLINICAL DATA:  67 year old female with dizziness and shortness of breath. EXAM: CHEST - 2 VIEW COMPARISON:  Chest CT dated 11/24/2016 FINDINGS: There is slight hyperexpansion of the lungs and flattening of the diaphragms likely related to underlying air trapping and emphysema. No focal consolidation, pleural effusion, or pneumothorax. The cardiac silhouette is within normal limits. No acute osseous pathology. IMPRESSION: No active cardiopulmonary disease. Emphysema. Electronically Signed   By: Anner Crete M.D.   On: 01/06/2018 05:11   Ct Head Wo Contrast  Result Date: 01/06/2018 CLINICAL DATA:  67 year old female with dizziness and generalized weakness. EXAM: CT HEAD WITHOUT CONTRAST TECHNIQUE: Contiguous axial images were obtained from the base of the skull through the vertex without intravenous contrast. COMPARISON:  Head CT dated 02/22/2015 FINDINGS: Brain: The ventricles and sulci appropriate size for patient's age. The gray-white matter discrimination is preserved. There is no acute intracranial hemorrhage. No mass effect or midline shift. No extra-axial fluid collection. Vascular: No hyperdense vessel or unexpected calcification. Skull: Normal. Negative for fracture or focal lesion. Sinuses/Orbits: No acute finding. Other: None IMPRESSION: No acute intracranial pathology. Electronically Signed   By: Anner Crete M.D.   On: 01/06/2018 05:30    IMPRESSION:   *   Deficiency anemia.  FOBT positive today but gives history of intermittent formed, dark stools.  Normally chronically constipated.  *    No prior upper endoscopy or colonoscopy.  *   Findings of diminutive pancreatic cyst, mildly enlarged liver cholesterol gallstone on 10/18 CT scan  *    Symptomatic anemia.    *     Elevated BNP.  No CHF per CXR.  Preliminary echo from this morning consistent with no significant LV dysfunction and mild diastolic dysfunction, same reading as 02/2016.  Awaiting  official reading  *   Emphysema.  Still smokes.    *    Poorly controlled type 2 IDDM.  *   AKI, baselilne CKD 3.  This certainly contributes to her anemia.  *  Bacteruria, pyuria per U/A    PLAN:     *    Complete transfusion ordered PRBCs x 3.    *    Colonoscopy and EGD 0930 tomorrow.  Risks of bleeding, respiratory troubles, bowel perforation discussed with patient and her daughter.  They are both eager to proceed with studies. Orders for clears, NPO tomorrow, dulcolax and split dose bowel prep written  *Follow-up  CT vs MRI/MRCP for eval of pancreatic cyst Recommended by radiology around October 2020.  We will leave this to the discretion of her primary care provider given her overall clinical state at that time.  *   Obtain a  PT/INR.     Azucena Freed  01/06/2018, 10:10 AM Phone [phone]  GI ATTENDING  History, laboratories, x-rays, echocardiogram reviewed.  Patient seen and examined.  Agree with comprehensive consultation note as outlined above.  The patient presents with profound iron deficiency anemia.  However, no acute or subacute GI bleeding as the stools are Hemoccult negative.  Elevated BNP work-up underway.  Chest.  Echo okay except for pulmonary hypertension.  Plan colonoscopy and upper endoscopy tomorrow to evaluate iron deficiency anemia.  Agree with transfusions.  Patient is HIGH RISK given her multiple comorbidities, diabetes requiring insulin, and pulmonary hypertension. The nature of the procedure, as well as the risks, benefits, and alternatives were carefully and thoroughly reviewed with the patient. Ample time for discussion and questions allowed. The patient understood, was satisfied, and agreed to proceed. She will need monitored anesthesia care for her deep sedation.  Docia Chuck. Geri Seminole., M.D. [facility] of Gastroenterology

## 2018-01-06 NOTE — ED Notes (Signed)
PAGED ADMITTING PER RN  

## 2018-01-06 NOTE — ED Triage Notes (Signed)
C/o intermittent dizziness and generalized weakness x 2 weeks.    No arm drift.

## 2018-01-06 NOTE — Assessment & Plan Note (Addendum)
Unable to obtain POC A1c after multiple attempts in office, could be due to low hemoglobin, also obtaining CBC. Will add A1c to venous draw. Patient does not check CBG's. Will continue current medications until A1c returns.  Will repeat UA and microalbumin/creatinine. Patient urged to schedule follow up with nephrology for proteinuria.

## 2018-01-06 NOTE — Assessment & Plan Note (Addendum)
Likely patient has new onset heart failure given orthopnea, BLLE pitting edema and new murmur heard on exam. Patient encouraged to allow [place] to direct admit, but reports that she cannot go into the hospital today. Patient walking around room and does not appear in any distress to go to the ED and has stable vitals. Discussed strict return precautions with patient and attending evaluated patient as well. See Dr. Macario Golds note.   Will obtain ECHO on 12/5.  EKG in office reviewed with no signs of STEMI and patient currently without any chest pain or SOB.  Will obtain CMP, TSH, BNP Started patient on 20mg  lasix for swelling as she is lasix naive. Patient to return in 1-2 days for BP check and to evaluate leg swelling after starting Lasix.

## 2018-01-06 NOTE — Assessment & Plan Note (Signed)
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 She has tried no medications. Will trial 5mg  melatonin.

## 2018-01-06 NOTE — Assessment & Plan Note (Signed)
Patient only on coreg 12.5 BID. Previously on ACEi that had been stopped during hospitalization in 11/2016 due to AKI. Patient compliant with coreg, but today initial BP 170/ 60. Patient appears to be having new onset CHF exacerbation, will return in 1-2 days for BP check as she would not like to go to the hospital today.

## 2018-01-06 NOTE — Progress Notes (Signed)
  Echocardiogram 2D Echocardiogram has been performed.  Sara Davis 01/06/2018, 10:29 AM

## 2018-01-06 NOTE — Plan of Care (Signed)
  Problem: Education: Goal: Knowledge of General Education information will improve Description Including pain rating scale, medication(s)/side effects and non-pharmacologic comfort measures Outcome: Progressing   Problem: Health Behavior/Discharge Planning: Goal: Ability to manage health-related needs will improve Outcome: Progressing   

## 2018-01-06 NOTE — H&P (View-Only) (Signed)
[facility] Consult: 10:10 AM 01/06/2018  LOS: 0 days    Referring Provider: Dr Ardelia Mems of [place] teaching service  Primary Care Physician:  [name], [name], DO Primary Gastroenterologist:  unassigned   Reason for Consultation:  Iron def anemia, occasional dark stools, currently FOBT negative.     HPI: Sara Davis is a 67 y.o. female.  PMH HTN.  Poorly controlled DM 2, insulin requiring. Emphysema. Stage 3 CKD.  Mildly enlarged liver, GB stone, pancreatic atrophy, 6 mm pancreatic cyst, small umbilical hernia on CT 11/9321.  The cyst was not followed up with MRI or GI referral.  Remote hysterectomy for uterine fibroids and anemia requiring po iron but not transfusion.   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  Generally avoids MDs when possible.  Has never followed up on suggestion for screening colonoscopy.   Several months of LE edema, SOB, 3 pillow orthopnea, LE edema, fatigue, dizziness, loss of appetite, wt down 10 to 20 # from previous years.  Has constipation for many years, occasional formed, black stools not obviously bloody.  No abd pain. No N/V.  3 weeks of cough, nasal congestion.  Not using NSAIDS.  No ASA except that contained in [employer], only 2 doses of this in last 3 weeks.  No ETOH.    Presented to ED with SOB etc.   Hgb is 4.5, baseline in 05/2017: 10.3.  MCV 83.    Ferritin 5.  Low Iron and iron saturation.  B12 and Folate ok.  BNP 1097.  Troponin 0.03 + AKI.   + hypertension.  No fever Emphysema, no pulm edema on CXR 3 Units PRBCs ordered 1 completed.   Smokes 1/2 PPD, down from 2 PPD.     Past Medical History:  Diagnosis Date  . Cellulitis of scalp 02/2015  . Depression   . Diabetes mellitus without complication ([place])   . Hypertension     Past Surgical  History:  Procedure Laterality Date  . ABDOMINAL HYSTERECTOMY      Prior to Admission medications   Medication Sig Start Date End Date Taking? Authorizing Provider  acetaminophen (TYLENOL) 500 MG tablet Take 1,000 mg by mouth every 6 (six) hours as needed for mild pain or fever.   Yes [provider]  aspirin 81 MG tablet Take 1 tablet (81 mg total) by mouth daily. 05/06/17  Yes Smiley Houseman, MD  atorvastatin (LIPITOR) 20 MG tablet TAKE 1 TABLET(20 MG) BY MOUTH DAILY AT 6 PM Patient taking differently: Take 20 mg by mouth daily.  05/07/17  Yes Smiley Houseman, MD  Blood Glucose Monitoring Suppl (CONTOUR BLOOD GLUCOSE SYSTEM) DEVI Use to test blood sugars once in the morning before food and once in the evening.DX: 250.02 10/17/14  Yes Smiley Houseman, MD  carvedilol (COREG) 12.5 MG tablet TAKE 1 TABLET(12.5 MG) BY MOUTH TWICE DAILY WITH A MEAL Patient taking differently: Take 12.5 mg by mouth 2 (two) times daily.  05/07/17  Yes Smiley Houseman, MD  glucose blood (ONE  TOUCH TEST STRIPS) test strip Please check blood sugar once daily in AM before eating and once daily in PM 10/17/14  Yes Gunadasa, Almond Lint, MD  insulin NPH-regular Human (NOVOLIN 70/30) (70-30) 100 UNIT/ML injection Inject 14 Units into the skin 3 (three) times daily with meals. Patient taking differently: Inject 20 Units into the skin 2 (two) times daily.  05/06/17  Yes Smiley Houseman, MD  Insulin Pen Needle (B-D ULTRAFINE III SHORT PEN) 31G X 8 MM MISC CHECK BLOOD SUGARS ONCE IN MORNING BEFORE FOOD AND ONCE IN THE EVENING. 10/17/14  Yes Smiley Houseman, MD  Lancets [facility] ULTRASOFT) lancets Please check blood sugar once daily in AM before eating and once daily in PM 06/28/12  Yes Cletus Gash, MD  metFORMIN (GLUCOPHAGE) 1000 MG tablet Take 1 tablet (1,000 mg total) by mouth 2 (two) times daily with a meal. 05/06/17  Yes Smiley Houseman, MD  ferrous sulfate 325 (65 FE) MG tablet Take 1  tablet (325 mg total) by mouth daily with breakfast. Patient not taking: Reported on 01/06/2018 05/24/17   Smiley Houseman, MD  furosemide (LASIX) 20 MG tablet Take 1 tablet (20 mg total) by mouth daily. Patient not taking: Reported on 01/06/2018 01/05/18   [name], [name], DO    Scheduled Meds: . nicotine  14 mg Transdermal Daily   Infusions: . cefTRIAXone (ROCEPHIN)  IV     PRN Meds:    Allergies as of 01/06/2018  . (No Known Allergies)    Family History  Problem Relation Age of Onset  . Diabetes Mother   . Cancer Mother   . Diabetes Sister   . Hypertension Sister   . Cancer Sister 30       Bone  . Diabetes Brother   . Diabetes Sister   . Diabetes Sister     Social History   Socioeconomic History  . Marital status: Single    Spouse name: Not on file  . Number of children: Not on file  . Years of education: Not on file  . Highest education level: Not on file  Occupational History  . Not on file  Social Needs  . Financial resource strain: Not on file  . Food insecurity:    Worry: Not on file    Inability: Not on file  . Transportation needs:    Medical: Not on file    Non-medical: Not on file  Tobacco Use  . Smoking status: Current Every Day Smoker    Packs/day: 0.50    Years: 40.00    Pack years: 20.00    Types: Cigarettes  . Smokeless tobacco: Never Used  . Tobacco comment: down from 1.5 ppd  Substance and Sexual Activity  . Alcohol use: No    Alcohol/week: 0.0 standard drinks  . Drug use: No  . Sexual activity: Not on file  Lifestyle  . Physical activity:    Days per week: Not on file    Minutes per session: Not on file  . Stress: Not on file  Relationships  . Social connections:    Talks on phone: Not on file    Gets together: Not on file    Attends religious service: Not on file    Active member of club or organization: Not on file    Attends meetings of clubs or organizations: Not on file    Relationship status: Not on file  .  Intimate partner violence:    Fear of current or ex partner: Not  on file    Emotionally abused: Not on file    Physically abused: Not on file    Forced sexual activity: Not on file  Other Topics Concern  . Not on file  Social History Narrative  . Not on file    REVIEW OF SYSTEMS: Constitutional:  Weakness fatigue.   ENT:  No nose bleeds Pulm:  Per HPI CV:  No palpitations, no LE edema.  GU:  No hematuria, no frequency GI:  Per HPI Heme:  No unusual bleeding or bruising   Transfusions:  None ever until today.   Neuro:  No headaches.  Some tingling and cold feelling in feet Psych: insomnia.   Derm:  Had shingles on her back several months ago, no neuralgia in the area.    Endocrine: sugars as high as 300s, low as 60s at home.  Not taking SSI.  No sweats or chills.  No polyuria or dysuria Immunization:  Not queried.  No record of flu shot for this year Travel:  None beyond local counties in last few months.    PHYSICAL EXAM: Vital signs in last 24 hours: Vitals:   01/06/18 0845 01/06/18 1002  BP: (!) 167/85 (!) 196/77  Pulse: 81 86  Resp: 15 13  Temp:  98 F (36.7 C)  SpO2: 100% 95%   Wt Readings from Last 3 Encounters:  01/05/18 67.6 kg  05/06/17 68.7 kg  01/06/17 71.2 kg    General: Chronically ill appearing.  Looks older than stated age.  Looks tired. Head: Facial asymmetry or swelling.  No signs of head trauma. Eyes: Scleral icterus.  Conjunctiva.  EOMI. Ears: Not hard of hearing. Nose: No discharge or congestion. Mouth: Oral mucosa and tongue somewhat dry.  Tongue midline.  Three quarters of her teeth missing, only 8 or 10 front lower teeth pain.  Her dentures are at home. Neck: No JVD, no masses, no thyromegaly. Lungs: Reduced breath sounds.  No cough.  No labored breathing. Heart: RRR.  No MRG.  S1, S2 present. Abdomen: Soft.  Not tender.  No organomegaly, bruits, masses, hernias.  Active bowel sounds.  Bulging flanks or obvious intra-abdominal fluid.     Rectal: Performed by ED staff, stool brown, FOBT negative. Musc/Skeltl: No joint redness or swelling. Extremities: Bilateral lower extremity edema more prominent in the right lower extremity where there is pitting. Neurologic: Oriented x3.  Appropriate.  Moves all 4 limbs, strength.  Tremors. Skin: Multiple healed, flat, hyperpigmented lesions, scars on her back from her bout of shingles.  Multiple healed scars on her legs Tattoos: None Nodes: No cervical. Psych: Pleasant, cooperative, laconic  Intake/Output from previous day: No intake/output data recorded. Intake/Output this shift: Total I/O In: 330 [Blood:330] Out: -   LAB RESULTS: Recent Labs    01/05/18 1206 01/06/18 0427  WBC WILL FOLLOW 8.3  HGB WILL FOLLOW 4.5*  HCT WILL FOLLOW 16.5*  PLT WILL FOLLOW 362   BMET Lab Results  Component Value Date   NA 142 01/06/2018   NA 142 01/05/2018   NA 143 05/06/2017   K 3.7 01/06/2018   K 4.4 01/05/2018   K 4.5 05/06/2017   CL 111 01/06/2018   CL 111 (H) 01/05/2018   CL 108 (H) 05/06/2017   CO2 21 (L) 01/06/2018   CO2 18 (L) 01/05/2018   CO2 20 05/06/2017   GLUCOSE 195 (H) 01/06/2018   GLUCOSE 173 (H) 01/05/2018   GLUCOSE 236 (H) 05/06/2017   BUN 26 (H) 01/06/2018  BUN 23 01/05/2018   BUN 22 05/06/2017   CREATININE 2.36 (H) 01/06/2018   CREATININE 2.26 (H) 01/05/2018   CREATININE 1.89 (H) 05/06/2017   CALCIUM 9.3 01/06/2018   CALCIUM 9.6 01/05/2018   CALCIUM 10.0 05/06/2017   LFT Recent Labs    01/05/18 1206  PROT 5.6*  ALBUMIN 3.6  AST 10  ALT 9  ALKPHOS 87  BILITOT <0.2   PT/INR No results found for: INR, PROTIME Hepatitis Panel No results for input(s): HEPBSAG, HCVAB, HEPAIGM, HEPBIGM in the last 72 hours. C-Diff No components found for: CDIFF Lipase     Component Value Date/Time   LIPASE 18 11/29/2016 1305    Drugs of Abuse  No results found for: LABOPIA, COCAINSCRNUR, LABBENZ, AMPHETMU, THCU, LABBARB   RADIOLOGY STUDIES: Dg Chest 2  View  Result Date: 01/06/2018 CLINICAL DATA:  67 year old female with dizziness and shortness of breath. EXAM: CHEST - 2 VIEW COMPARISON:  Chest CT dated 11/24/2016 FINDINGS: There is slight hyperexpansion of the lungs and flattening of the diaphragms likely related to underlying air trapping and emphysema. No focal consolidation, pleural effusion, or pneumothorax. The cardiac silhouette is within normal limits. No acute osseous pathology. IMPRESSION: No active cardiopulmonary disease. Emphysema. Electronically Signed   By: Anner Crete M.D.   On: 01/06/2018 05:11   Ct Head Wo Contrast  Result Date: 01/06/2018 CLINICAL DATA:  67 year old female with dizziness and generalized weakness. EXAM: CT HEAD WITHOUT CONTRAST TECHNIQUE: Contiguous axial images were obtained from the base of the skull through the vertex without intravenous contrast. COMPARISON:  Head CT dated 02/22/2015 FINDINGS: Brain: The ventricles and sulci appropriate size for patient's age. The gray-white matter discrimination is preserved. There is no acute intracranial hemorrhage. No mass effect or midline shift. No extra-axial fluid collection. Vascular: No hyperdense vessel or unexpected calcification. Skull: Normal. Negative for fracture or focal lesion. Sinuses/Orbits: No acute finding. Other: None IMPRESSION: No acute intracranial pathology. Electronically Signed   By: Anner Crete M.D.   On: 01/06/2018 05:30    IMPRESSION:   *   Deficiency anemia.  FOBT positive today but gives history of intermittent formed, dark stools.  Normally chronically constipated.  *    No prior upper endoscopy or colonoscopy.  *   Findings of diminutive pancreatic cyst, mildly enlarged liver cholesterol gallstone on 10/18 CT scan  *    Symptomatic anemia.    *     Elevated BNP.  No CHF per CXR.  Preliminary echo from this morning consistent with no significant LV dysfunction and mild diastolic dysfunction, same reading as 02/2016.  Awaiting  official reading  *   Emphysema.  Still smokes.    *    Poorly controlled type 2 IDDM.  *   AKI, baselilne CKD 3.  This certainly contributes to her anemia.  *  Bacteruria, pyuria per U/A    PLAN:     *    Complete transfusion ordered PRBCs x 3.    *    Colonoscopy and EGD 0930 tomorrow.  Risks of bleeding, respiratory troubles, bowel perforation discussed with patient and her daughter.  They are both eager to proceed with studies. Orders for clears, NPO tomorrow, dulcolax and split dose bowel prep written  *Follow-up  CT vs MRI/MRCP for eval of pancreatic cyst Recommended by radiology around October 2020.  We will leave this to the discretion of her primary care provider given her overall clinical state at that time.  *   Obtain a  PT/INR.     Azucena Freed  01/06/2018, 10:10 AM Phone [phone]  GI ATTENDING  History, laboratories, x-rays, echocardiogram reviewed.  Patient seen and examined.  Agree with comprehensive consultation note as outlined above.  The patient presents with profound iron deficiency anemia.  However, no acute or subacute GI bleeding as the stools are Hemoccult negative.  Elevated BNP work-up underway.  Chest.  Echo okay except for pulmonary hypertension.  Plan colonoscopy and upper endoscopy tomorrow to evaluate iron deficiency anemia.  Agree with transfusions.  Patient is HIGH RISK given her multiple comorbidities, diabetes requiring insulin, and pulmonary hypertension. The nature of the procedure, as well as the risks, benefits, and alternatives were carefully and thoroughly reviewed with the patient. Ample time for discussion and questions allowed. The patient understood, was satisfied, and agreed to proceed. She will need monitored anesthesia care for her deep sedation.  Docia Chuck. Geri Seminole., M.D. [facility] Division of Gastroenterology

## 2018-01-06 NOTE — Assessment & Plan Note (Signed)
Patient with worsening SOB and hx of anemia. Reports she has not been taking her iron supplementation. Will obtain CBC.

## 2018-01-06 NOTE — H&P (Signed)
[facility] Admission History and Physical Service Pager: [phone]  Patient name: NEMESIS RAINWATER Medical record number: [id number] Date of birth: [birth year] Age: 67 y.o. Gender: female  Primary Care Provider: [name], [name], DO Consultants: GI  Code Status: FULL   Chief Complaint: dizziness, shortness of breath  Assessment and Plan: DEZYRAE KENSINGER is a 67 y.o. female presenting with symptomatic anemia. PMH is significant for CHF, anemia, T2DM, HTN and tobacco use.   Shortness of breath and dizziness 2/2 anemia  Patient presenting with SOB x1 month duration.  On arrival, vitals stable with HR 80, RR 18 and BP 148/78.  Seen at PCP office visit yesterday, labs ordered and scheduled for outpatient echocardiogram today.  Negative head CT in ED and CXR without evidence of active cardiopulmonary disease.  Afebrile without white count.  Labs notable for Hgb 4.5 and she is receiving 3 units in the ED. BNP elevated to 1,097 and initial troponin 0.03. Recent TSH is within normal range.   No respiratory distress on exam, she is stable on RA satting 100% and conversing comfortably. Lung exam clear and 1+ pitting edema bilaterally.  Patient reports intermittent dark stools, however no obvious active bleeding on my exam and FOBT negative in ED.  Vitamin B12 and folate wnl with low ferritin, iron panel indicative of iron deficiency anemia and concerning for blood loss given history of dark stools and low Hgb.   Most likely etiology of SOB and leg swelling is her significant anemia.  No report of NSAID use however patient does take aspirin 81 mg daily.  Ferrous sulfate listed on home med list however pt states she is not taking.  Differential also includes worsening CHF given exam and elevated BNP, will obtain repeat echocardiogram.   GI consulted this morning and will keep NPO pending eval.  Admitted to tele for close monitoring and management.  -admit to telemetry, attending Dr.  Ardelia Mems   -home aspirin held, SCDs only for prophylaxis  -currently receiving 3 units pRBCs, f/u post transfusion HH   -GI consulted this AM, appreciate recs  -given 20 mg IV lasix in ED; monitor fluid status  -monitor Hgb  -Echocardiogram  -trend trops -vitals per unit routine  CHF Most recent echo 55 to 60% in October 2018 per chart review.  Patient takes Coreg 12.5 mg BID, seen at PCP office yesterday and started on Lasix 20 mg daily at home.  Reports good medication compliance.  PCP ordered outpatient echo scheduled for today however presented to ED earlier this morning for symptoms.  Patient reports worsening leg swelling and shortness of breath with exertion likely indicative of worsening congestive heart failure.   Physical exam notable for 1+ pitting edema.  She received 20 mg IV Lasix in ED with improvement in breathing and leg edema.  -f/u repeat echo  -monitor fluid status, diurese as necessary -NPO pending GI eval   UTI  UA on admission with moderate leuks, negative nitrate.  Given IV CTX x 1 dose in ED.  On admission patient denies symptoms of dysuria, frequency and urgency.  Will hold off on continuing antibiotics as she is asymptomatic.   -follow urine cx   AKI SCr 2.36, baseline appears to be 1.4-1.8 per chart review.  No report of recent NSAID use.  Monitor on daily BMET.  Avoid nephrotoxic agents.  T2 diabetes mellitus Last A1c 9.9 in 05/2017.  At home on Novolin 70/30 14 units TID and metformin 1000 mg BID.   -  Start sensitive SSI - A1c pending  HTN On arrival with BP 148/78.  At home on Coreg 12.5 mg BID.   -monitor blood pressures   HLD At home on Lipitor 20 mg daily.  -continue home lipitor   Tobacco use 1 PPD since 67 years old, interested in nicotine patch while admitted.  -transdermal nicotine patch, 14 mg    FEN/GI: SL IV, NPO, 3 units pRBCs  Prophylaxis: held in setting of possible bleed, SCDs only    Disposition: admit to telemetry, attending  Dr. Ardelia Mems   History of Present Illness:  AMORIA MCLEES is a 67 y.o. female presenting with shortness of breath, worse with exertion which began about 1 month ago when she had a cold.  Has had a productive cough from recent cold, no fevers or chills.  Reports worsening leg swelling over the past several weeks associated with dizziness and fatigue.  Reports 3 pillow orthopnea, has not required oxygen at home or on arrival.  She denies CP, palpitations.  Saw her PCP yesterday and was scheduled for echocardiogram today however she presented to ED earlier due to her worsening symptoms.   Review Of Systems: Per HPI with the following additions:  Review of Systems  Constitutional: Negative for chills and fever.  Respiratory: Positive for shortness of breath.   Cardiovascular: Positive for orthopnea and leg swelling. Negative for chest pain and palpitations.  Gastrointestinal: Negative for abdominal pain, constipation, diarrhea, nausea and vomiting.  Genitourinary: Negative for dysuria, frequency and urgency.   Patient Active Problem List   Diagnosis Date Noted  . CHF exacerbation ([place]) 01/06/2018  . Bilateral leg edema 01/06/2018  . Pancreatic cyst 12/03/2016  . Anemia 12/03/2016  . AKI (acute kidney injury) ([place])   . Protein-calorie malnutrition, severe 11/25/2016  . Chest pain   . Hypertensive urgency   . Onychomycosis 07/30/2015  . Essential hypertension   . Headache 04/18/2014  . Insomnia 07/31/2013  . Uncontrolled type 2 diabetes mellitus ([place]) 01/14/2012  . Tobacco abuse 01/14/2012   Past Medical History: Past Medical History:  Diagnosis Date  . Cellulitis of scalp 02/2015  . Depression   . Diabetes mellitus without complication ([place])   . Hypertension    Past Surgical History: Past Surgical History:  Procedure Laterality Date  . ABDOMINAL HYSTERECTOMY     Social History: Social History   Tobacco Use  . Smoking status: Current Every Day Smoker    Packs/day: 0.50     Years: 40.00    Pack years: 20.00    Types: Cigarettes  . Smokeless tobacco: Never Used  . Tobacco comment: down from 1.5 ppd  Substance Use Topics  . Alcohol use: No    Alcohol/week: 0.0 standard drinks  . Drug use: No   Additional social history: Tobacco use since age 7, no alcohol or illicit drug use.  Please also refer to relevant sections of EMR.  Family History: Family History  Problem Relation Age of Onset  . Diabetes Mother   . Cancer Mother   . Diabetes Sister   . Hypertension Sister   . Cancer Sister 64       Bone  . Diabetes Brother   . Diabetes Sister   . Diabetes Sister    Allergies and Medications: No Known Allergies No current facility-administered medications on file prior to encounter.    Current Outpatient Medications on File Prior to Encounter  Medication Sig Dispense Refill  . acetaminophen (TYLENOL) 500 MG tablet Take 1,000 mg by mouth  every 6 (six) hours as needed for mild pain or fever.    Marland Kitchen aspirin 81 MG tablet Take 1 tablet (81 mg total) by mouth daily. 30 tablet 3  . atorvastatin (LIPITOR) 20 MG tablet TAKE 1 TABLET(20 MG) BY MOUTH DAILY AT 6 PM (Patient taking differently: Take 20 mg by mouth daily. ) 90 tablet 1  . Blood Glucose Monitoring Suppl (CONTOUR BLOOD GLUCOSE SYSTEM) DEVI Use to test blood sugars once in the morning before food and once in the evening.DX: 250.02 1 Device 0  . carvedilol (COREG) 12.5 MG tablet TAKE 1 TABLET(12.5 MG) BY MOUTH TWICE DAILY WITH A MEAL (Patient taking differently: Take 12.5 mg by mouth 2 (two) times daily. ) 180 tablet 1  . glucose blood (ONE TOUCH TEST STRIPS) test strip Please check blood sugar once daily in AM before eating and once daily in PM 100 each 12  . insulin NPH-regular Human (NOVOLIN 70/30) (70-30) 100 UNIT/ML injection Inject 14 Units into the skin 3 (three) times daily with meals. (Patient taking differently: Inject 20 Units into the skin 2 (two) times daily. ) 1 vial 3  . Insulin Pen Needle (B-D  ULTRAFINE III SHORT PEN) 31G X 8 MM MISC CHECK BLOOD SUGARS ONCE IN MORNING BEFORE FOOD AND ONCE IN THE EVENING. 100 each 0  . Lancets (ONETOUCH ULTRASOFT) lancets Please check blood sugar once daily in AM before eating and once daily in PM 100 each 12  . metFORMIN (GLUCOPHAGE) 1000 MG tablet Take 1 tablet (1,000 mg total) by mouth 2 (two) times daily with a meal. 60 tablet 2  . ferrous sulfate 325 (65 FE) MG tablet Take 1 tablet (325 mg total) by mouth daily with breakfast. (Patient not taking: Reported on 01/06/2018) 30 tablet 1  . furosemide (LASIX) 20 MG tablet Take 1 tablet (20 mg total) by mouth daily. (Patient not taking: Reported on 01/06/2018) 30 tablet 3   Objective: BP (!) 167/85   Pulse 81   Temp 97.9 F (36.6 C) (Oral)   Resp 15   SpO2 100%    Exam: General: 67 year old female, no acute distress Eyes: PERRLA, conjunctival pallor  ENTM: Dry mucous membranes, oropharynx clear Neck: Supple, +JVD Cardiovascular: RRR no MRG, 2+ pedal pulses Respiratory: Normal effort, wheeze or rales Gastrointestinal: Soft, nontender Extremities: 1+ pitting edema bilaterally in lower extremities Derm: Warm, dry Neuro: Alert, oriented x3, no focal deficits Psych: Normal mood and affect  Labs and Imaging: CBC BMET  Recent Labs  Lab 01/06/18 0427  WBC 8.3  HGB 4.5*  HCT 16.5*  PLT 362   Recent Labs  Lab 01/06/18 0427  NA 142  K 3.7  CL 111  CO2 21*  BUN 26*  CREATININE 2.36*  GLUCOSE 195*  CALCIUM 9.3     Dg Chest 2 View  Result Date: 01/06/2018 CLINICAL DATA:  67 year old female with dizziness and shortness of breath. EXAM: CHEST - 2 VIEW COMPARISON:  Chest CT dated 11/24/2016 FINDINGS: There is slight hyperexpansion of the lungs and flattening of the diaphragms likely related to underlying air trapping and emphysema. No focal consolidation, pleural effusion, or pneumothorax. The cardiac silhouette is within normal limits. No acute osseous pathology. IMPRESSION: No active  cardiopulmonary disease. Emphysema. Electronically Signed   By: Anner Crete M.D.   On: 01/06/2018 05:11   Ct Head Wo Contrast  Result Date: 01/06/2018 CLINICAL DATA:  67 year old female with dizziness and generalized weakness. EXAM: CT HEAD WITHOUT CONTRAST TECHNIQUE: Contiguous axial images were obtained  from the base of the skull through the vertex without intravenous contrast. COMPARISON:  Head CT dated 02/22/2015 FINDINGS: Brain: The ventricles and sulci appropriate size for patient's age. The gray-white matter discrimination is preserved. There is no acute intracranial hemorrhage. No mass effect or midline shift. No extra-axial fluid collection. Vascular: No hyperdense vessel or unexpected calcification. Skull: Normal. Negative for fracture or focal lesion. Sinuses/Orbits: No acute finding. Other: None IMPRESSION: No acute intracranial pathology. Electronically Signed   By: Anner Crete M.D.   On: 01/06/2018 05:30    Lovenia Kim, MD 01/06/2018, 9:19 AM PGY-3, [place] Intern pager: [phone], text pages welcome

## 2018-01-07 ENCOUNTER — Encounter (HOSPITAL_COMMUNITY)
Admission: EM | Disposition: A | Discharge status: Home / Self Care | Payer: Self-pay | Source: Home / Self Care | Attending: Family Medicine

## 2018-01-07 ENCOUNTER — Inpatient Hospital Stay (HOSPITAL_COMMUNITY): Payer: Medicare HMO | Admitting: Certified Registered Nurse Anesthetist

## 2018-01-07 ENCOUNTER — Encounter (HOSPITAL_COMMUNITY): Payer: Self-pay | Admitting: Internal Medicine

## 2018-01-07 ENCOUNTER — Other Ambulatory Visit: Payer: Self-pay

## 2018-01-07 DIAGNOSIS — K31819 Angiodysplasia of stomach and duodenum without bleeding: Secondary | ICD-10-CM

## 2018-01-07 DIAGNOSIS — K253 Acute gastric ulcer without hemorrhage or perforation: Secondary | ICD-10-CM

## 2018-01-07 DIAGNOSIS — D123 Benign neoplasm of transverse colon: Secondary | ICD-10-CM

## 2018-01-07 DIAGNOSIS — D125 Benign neoplasm of sigmoid colon: Secondary | ICD-10-CM

## 2018-01-07 HISTORY — PX: POLYPECTOMY: SHX5525

## 2018-01-07 HISTORY — PX: COLONOSCOPY WITH PROPOFOL: SHX5780

## 2018-01-07 HISTORY — PX: ESOPHAGOGASTRODUODENOSCOPY (EGD) WITH PROPOFOL: SHX5813

## 2018-01-07 HISTORY — PX: BIOPSY: SHX5522

## 2018-01-07 LAB — CBC
HCT: 29.8 % — ABNORMAL LOW (ref 36.0–46.0)
HCT: 31.9 % — ABNORMAL LOW (ref 36.0–46.0)
Hemoglobin: 8.8 g/dL — ABNORMAL LOW (ref 12.0–15.0)
Hemoglobin: 9.7 g/dL — ABNORMAL LOW (ref 12.0–15.0)
MCH: 24.8 pg — ABNORMAL LOW (ref 26.0–34.0)
MCH: 25.3 pg — ABNORMAL LOW (ref 26.0–34.0)
MCHC: 29.5 g/dL — ABNORMAL LOW (ref 30.0–36.0)
MCHC: 30.4 g/dL (ref 30.0–36.0)
MCV: 83.9 fL (ref 80.0–100.0)
MCV: 83.3 fL (ref 80.0–100.0)
NRBC: 0.2 % (ref 0.0–0.2)
Platelets: 364 10*3/uL (ref 150–400)
Platelets: 392 10*3/uL (ref 150–400)
RBC: 3.55 MIL/uL — AB (ref 3.87–5.11)
RBC: 3.83 MIL/uL — AB (ref 3.87–5.11)
RDW: 14.6 % (ref 11.5–15.5)
RDW: 14.8 % (ref 11.5–15.5)
WBC: 9.9 10*3/uL (ref 4.0–10.5)
WBC: 9.6 10*3/uL (ref 4.0–10.5)
nRBC: 0 % (ref 0.0–0.2)

## 2018-01-07 LAB — BPAM RBC
Blood Product Expiration Date: 201912292359
Blood Product Expiration Date: 201912292359
Blood Product Expiration Date: 201912292359
ISSUE DATE / TIME: 201912051049
ISSUE DATE / TIME: 201912050714
ISSUE DATE / TIME: 201912051939
Unit Type and Rh: 5100
Unit Type and Rh: 5100
Unit Type and Rh: 5100

## 2018-01-07 LAB — TYPE AND SCREEN
ABO/RH(D): O POS
Antibody Screen: NEGATIVE
Unit division: 0
Unit division: 0
Unit division: 0

## 2018-01-07 LAB — BASIC METABOLIC PANEL
Anion gap: 11 (ref 5–15)
BUN: 19 mg/dL (ref 8–23)
CO2: 16 mmol/L — AB (ref 22–32)
Calcium: 9.3 mg/dL (ref 8.9–10.3)
Chloride: 115 mmol/L — ABNORMAL HIGH (ref 98–111)
Creatinine, Ser: 2.03 mg/dL — ABNORMAL HIGH (ref 0.44–1.00)
GFR calc Af Amer: 29 mL/min — ABNORMAL LOW (ref >60)
GFR calc non Af Amer: 25 mL/min — ABNORMAL LOW (ref >60)
Glucose, Bld: 93 mg/dL (ref 70–99)
Potassium: 3.3 mmol/L — ABNORMAL LOW (ref 3.5–5.1)
Sodium: 142 mmol/L (ref 135–145)

## 2018-01-07 LAB — GLUCOSE, CAPILLARY
GLUCOSE-CAPILLARY: 152 mg/dL — AB (ref 70–99)
Glucose-Capillary: 96 mg/dL (ref 70–99)
Glucose-Capillary: 98 mg/dL (ref 70–99)
Glucose-Capillary: 191 mg/dL — ABNORMAL HIGH (ref 70–99)

## 2018-01-07 LAB — PROTIME-INR
INR: 1.1
Prothrombin Time: 14.1 seconds (ref 11.4–15.2)

## 2018-01-07 LAB — TROPONIN I
Troponin I: 0.03 ng/mL (ref <0.03)
Troponin I: 0.03 ng/mL (ref <0.03)

## 2018-01-07 LAB — HIV ANTIBODY (ROUTINE TESTING W REFLEX): HIV Screen 4th Generation wRfx: NONREACTIVE

## 2018-01-07 SURGERY — COLONOSCOPY WITH PROPOFOL
Anesthesia: Monitor Anesthesia Care

## 2018-01-07 MED ORDER — PROPOFOL 500 MG/50ML IV EMUL
INTRAVENOUS | Status: DC | PRN
  Administered 2018-01-07: 100 ug/kg/min via INTRAVENOUS

## 2018-01-07 MED ORDER — HYDRALAZINE HCL 20 MG/ML IJ SOLN
5.0000 mg | INTRAMUSCULAR | Status: DC | PRN
  Administered 2018-01-08: 5 mg via INTRAVENOUS
  Filled 2018-01-07 (×2): qty 1

## 2018-01-07 MED ORDER — SODIUM CHLORIDE 0.9 % IV SOLN
INTRAVENOUS | Status: DC | PRN
  Administered 2018-01-07: 12:00:00 via INTRAVENOUS

## 2018-01-07 MED ORDER — LIDOCAINE 2% (20 MG/ML) 5 ML SYRINGE
INTRAMUSCULAR | Status: DC | PRN
  Administered 2018-01-07: 60 mg via INTRAVENOUS

## 2018-01-07 MED ORDER — PROPOFOL 10 MG/ML IV BOLUS
INTRAVENOUS | Status: DC | PRN
  Administered 2018-01-07: 40 mg via INTRAVENOUS
  Administered 2018-01-07: 30 mg via INTRAVENOUS
  Administered 2018-01-07: 40 mg via INTRAVENOUS
  Administered 2018-01-07: 20 mg via INTRAVENOUS

## 2018-01-07 MED ORDER — CARVEDILOL 25 MG PO TABS
25.0000 mg | ORAL_TABLET | Freq: Two times a day (BID) | ORAL | Status: DC
  Administered 2018-01-07 – 2018-01-09 (×4): 25 mg via ORAL
  Filled 2018-01-07 (×4): qty 1

## 2018-01-07 MED ORDER — FERROUS SULFATE 325 (65 FE) MG PO TABS
325.0000 mg | ORAL_TABLET | Freq: Two times a day (BID) | ORAL | Status: DC
  Administered 2018-01-08 – 2018-01-09 (×3): 325 mg via ORAL
  Filled 2018-01-07 (×3): qty 1

## 2018-01-07 MED ORDER — PANTOPRAZOLE SODIUM 40 MG PO TBEC
40.0000 mg | DELAYED_RELEASE_TABLET | Freq: Every day | ORAL | Status: DC
  Administered 2018-01-08 – 2018-01-09 (×2): 40 mg via ORAL
  Filled 2018-01-07 (×2): qty 1

## 2018-01-07 MED ORDER — FUROSEMIDE 40 MG PO TABS
40.0000 mg | ORAL_TABLET | Freq: Every day | ORAL | Status: DC
  Administered 2018-01-07 – 2018-01-09 (×3): 40 mg via ORAL
  Filled 2018-01-07 (×3): qty 1

## 2018-01-07 SURGICAL SUPPLY — 25 items

## 2018-01-07 NOTE — Anesthesia Preprocedure Evaluation (Addendum)
Anesthesia Evaluation  Patient identified by MRN, date of birth, ID band Patient awake    Reviewed: Allergy & Precautions, NPO status , Patient's Chart, lab work & pertinent test results  Airway Mallampati: I       Dental  (+) Edentulous Upper, Edentulous Lower   Pulmonary Current Smoker,    Pulmonary exam normal breath sounds clear to auscultation       Cardiovascular hypertension, Pt. on medications and Pt. on home beta blockers Normal cardiovascular exam Rhythm:Regular Rate:Normal     Neuro/Psych    GI/Hepatic   Endo/Other  diabetes, Type 2, Oral Hypoglycemic Agents, Insulin Dependent  Renal/GU   negative genitourinary   Musculoskeletal   Abdominal Normal abdominal exam  (+)   Peds  Hematology  (+) Blood dyscrasia, anemia ,   Anesthesia Other Findings   Reproductive/Obstetrics                            Anesthesia Physical Anesthesia Plan  ASA: III  Anesthesia Plan: MAC   Post-op Pain Management:    Induction:   PONV Risk Score and Plan: 1 and Propofol infusion  Airway Management Planned: Natural Airway, Nasal Cannula and Mask  Additional Equipment:   Intra-op Plan:   Post-operative Plan:   Informed Consent: I have reviewed the patients History and Physical, chart, labs and discussed the procedure including the risks, benefits and alternatives for the proposed anesthesia with the patient or authorized representative who has indicated his/her understanding and acceptance.     Plan Discussed with: CRNA  Anesthesia Plan Comments:         Anesthesia Quick Evaluation

## 2018-01-07 NOTE — Progress Notes (Signed)
Pt in ENDO lab CCMD on stand by

## 2018-01-07 NOTE — Op Note (Signed)
[facility] Patient Name: Sara Davis Procedure Date : 01/07/2018 MRN: [id number] Attending MD: Docia Chuck. Henrene Pastor , MD Date of Birth: Jul 01, 1950 CSN: [id number] Age: 67 Admit Type: Inpatient Procedure:                Upper GI endoscopy with biopsy Indications:              Iron deficiency anemia Providers:                Docia Chuck. Henrene Pastor, MD, Jobe Igo, RN, Cherylynn Ridges, Technician Referring MD:             [facility] hospitalist Medicines:                Monitored Anesthesia Care Complications:            No immediate complications. Estimated Blood Loss:     Estimated blood loss: none. Procedure:                Pre-Anesthesia Assessment:                           - Prior to the procedure, a History and Physical                            was performed, and patient medications and                            allergies were reviewed. The patient's tolerance of                            previous anesthesia was also reviewed. The risks                            and benefits of the procedure and the sedation                            options and risks were discussed with the patient.                            All questions were answered, and informed consent                            was obtained. Prior Anticoagulants: The patient has                            taken no previous anticoagulant or antiplatelet                            agents. ASA Grade Assessment: III - A patient with                            severe systemic disease. After reviewing the risks  and benefits, the patient was deemed in                            satisfactory condition to undergo the procedure.                           After obtaining informed consent, the endoscope was                            passed under direct vision. Throughout the                            procedure, the patient's blood pressure, pulse, and      oxygen saturations were monitored continuously. The                            [id number]-H190 ([id number]) Olympus Adult EGD was introduced                            through the mouth, and advanced to the second part                            of duodenum. The upper GI endoscopy was                            accomplished without difficulty. The patient                            tolerated the procedure well. Scope In: Scope Out: Findings:      Mild esophagitis was found at the gastroesophageal junction.      One non-bleeding cratered gastric ulcer with no stigmata of bleeding was       found in the gastric antrum. The lesion was 6 mm in largest dimension.       Biopsies were taken with a cold forceps for Helicobacter pylori testing       using CLOtest.      The stomach was otherwise normal.      A few small angiodysplastic lesions without bleeding were found in the       second portion of the duodenum.      The examined duodenum was otherwise normal.      The cardia and gastric fundus were normal on retroflexion. Impression:               - Reflux esophagitis.                           - Non-bleeding gastric ulcer with no stigmata of                            bleeding. Biopsied. This finding may contribute to                            iron deficiency anemia.                           -  Normal stomach otherwise.                           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 This finding may contribute to iron                            deficiency anemia.                           - Normal examined duodenum otherwise. Recommendation:           1. Pantoprazole 40 mg daily indefinitely                           2. Follow-up CLO biopsy and treat if positive                           3. Avoid unnecessary NSAIDs                           4. Iron sulfate 325 mg twice daily INDEFINITELY                           5. Patient's PCP to monitor  blood counts and iron                            levels as outpatient                           6. Resume previous diet                           7. No specific GI follow-up required. Will sign                            off. Thank you                           . Procedure Code(s):        --- Professional ---                           [id number], Esophagogastroduodenoscopy, flexible,                            transoral; with biopsy, single or multiple Diagnosis Code(s):        --- Professional ---                           K21.0, Gastro-esophageal reflux disease with                            esophagitis  K25.9, Gastric ulcer, unspecified as acute or                            chronic, without hemorrhage or perforation                           K31.819, Angiodysplasia of stomach and duodenum                            without bleeding                           D50.9, Iron deficiency anemia, unspecified CPT copyright 2018 [employer]. All rights reserved. The codes documented in this report are preliminary and upon coder review may  be revised to meet current compliance requirements. Docia Chuck. Henrene Pastor, MD 01/07/2018 1:08:41 PM This report has been signed electronically. Number of Addenda: 0

## 2018-01-07 NOTE — Consult Note (Addendum)
Reason for Consult: Pulmonary hypertension Referring Physician: Family Medicine teaching service  Sara Davis is an 66 y.o. female.  HPI:   67 y/o [place] American female with uncontrolled hypertension, type 2 DM, depression, admitted with worsening dyspnea, dizziness, and leg swelling. Workup showed severe iron deficiency anemia requiring PRBC transfusion and pending GI workup. Echocardiogram showed new finding of pulmonary hypertension with PASP 65 mmHg, mod LVH with grade II DD, preserved LVEF. Cardiology thus consulted.   Patient has been having worsening dyspnea and leg edema symptoms for last few months. She denies any chest pain. She has had dizziness, but denies any syncopal episodes. She has not noticed any blood in stool. She has 50 PY smoking history. She has never had evaluation for COPD. 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   Past Medical History:  Diagnosis Date  . Cellulitis of scalp 02/2015  . Depression   . Diabetes mellitus without complication ([place])   . Hypertension     Past Surgical History:  Procedure Laterality Date  . ABDOMINAL HYSTERECTOMY      Family History  Problem Relation Age of Onset  . Diabetes Mother   . Cancer Mother   . Diabetes Sister   . Hypertension Sister   . Cancer Sister 83       Bone  . Diabetes Brother   . Diabetes Sister   . Diabetes Sister     Social History:  reports that she has been smoking cigarettes. She has a 20.00 pack-year smoking history. She has never used smokeless tobacco. She reports that she does not drink alcohol or use drugs.  Allergies: No Known Allergies  Medications: I have reviewed the patient's current medications.  Results for orders placed or performed during the hospital encounter of 01/06/18 (from the past 48 hour(s))  Basic metabolic panel     Status: Abnormal   Collection Time: 01/06/18  4:27 AM  Result Value Ref Range   Sodium 142 135 - 145 mmol/L   Potassium  3.7 3.5 - 5.1 mmol/L   Chloride 111 98 - 111 mmol/L   CO2 21 (L) 22 - 32 mmol/L   Glucose, Bld 195 (H) 70 - 99 mg/dL   BUN 26 (H) 8 - 23 mg/dL   Creatinine, Ser 2.36 (H) 0.44 - 1.00 mg/dL   Calcium 9.3 8.9 - 10.3 mg/dL   GFR calc non Af Amer 21 (L) >60 mL/min   GFR calc Af Amer 24 (L) >60 mL/min   Anion gap 10 5 - 15    Comment: Performed at [facility], [address], [place] [address]  CBC     Status: Abnormal   Collection Time: 01/06/18  4:27 AM  Result Value Ref Range   WBC 8.3 4.0 - 10.5 K/uL   RBC 1.97 (L) 3.87 - 5.11 MIL/uL   Hemoglobin 4.5 (LL) 12.0 - 15.0 g/dL    Comment: REPEATED TO VERIFY THIS CRITICAL RESULT HAS VERIFIED AND BEEN CALLED TO J. FERRAINOLO,RN BY TERRAN TYSOR ON 12 05 2019 AT 0503, AND HAS BEEN READ BACK.     HCT 16.5 (L) 36.0 - 46.0 %   MCV 83.8 80.0 - 100.0 fL   MCH 22.8 (L) 26.0 - 34.0 pg   MCHC 27.3 (L) 30.0 - 36.0 g/dL   RDW 16.0 (H) 11.5 - 15.5 %   Platelets 362 150 - 400 K/uL   nRBC 0.0 0.0 - 0.2 %  Comment: Performed at [facility], [place] [address] [place], [place] [address]  Brain natriuretic peptide     Status: Abnormal   Collection Time: 01/06/18  4:41 AM  Result Value Ref Range   B Natriuretic Peptide 1,097.5 (H) 0.0 - 100.0 pg/mL    Comment: Performed at [facility] [address] [place], [place] [address]  Troponin I - ONCE - STAT     Status: Abnormal   Collection Time: 01/06/18  4:41 AM  Result Value Ref Range   Troponin I 0.03 (HH) <0.03 ng/mL    Comment: CRITICAL RESULT CALLED TO, READ BACK BY AND VERIFIED WITH: J.FERRINELLO,RN 0545 01/06/18 M.CAMPBELL Performed at [facility], [place] [address], [place], [place] [address]   Prepare RBC     Status: None   Collection Time: 01/06/18  5:15 AM  Result Value Ref Range   Order Confirmation      ORDER PROCESSED BY BLOOD BANK Performed at [facility], [place] [address] [place], [place] [address]   Type and screen [place]      Status: None   Collection Time: 01/06/18  5:27 AM  Result Value Ref Range   ABO/RH(D) O POS    Antibody Screen NEG    Sample Expiration 01/09/2018    Unit Number [id number]    Blood Component Type RBC LR PHER1    Unit division 00    Status of Unit ISSUED,FINAL    Transfusion Status OK TO TRANSFUSE    Crossmatch Result Compatible    Unit Number [id number]    Blood Component Type RED CELLS,LR    Unit division 00    Status of Unit ISSUED,FINAL    Transfusion Status OK TO TRANSFUSE    Crossmatch Result      Compatible Performed at [facility], [place] [address], [place], [place] [address]    Unit Number [id number]    Blood Component Type RED CELLS,LR    Unit division 00    Status of Unit ISSUED,FINAL    Transfusion Status OK TO TRANSFUSE    Crossmatch Result Compatible   ABO/Rh     Status: None   Collection Time: 01/06/18  5:27 AM  Result Value Ref Range   ABO/RH(D)      O POS Performed at [facility] [address] [place], [place] [address]   POC occult blood, ED RN will collect     Status: None   Collection Time: 01/06/18  5:51 AM  Result Value Ref Range   Fecal Occult Bld NEGATIVE NEGATIVE  Vitamin B12     Status: None   Collection Time: 01/06/18  6:01 AM  Result Value Ref Range   Vitamin B-12 223 180 - 914 pg/mL    Comment: (NOTE) This assay is not validated for testing neonatal or myeloproliferative syndrome specimens for Vitamin B12 levels. Performed at [facility], [place] [address] [place],  [address]   Folate     Status: None   Collection Time: 01/06/18  6:01 AM  Result Value Ref Range   Folate 8.0 >5.9 ng/mL    Comment: Performed at [facility] [address] [place], [place] [address]  Iron and TIBC     Status: Abnormal   Collection Time: 01/06/18  6:01 AM  Result Value Ref Range   Iron 8 (L) 28 - 170 ug/dL   TIBC 365 250 - 450 ug/dL  Saturation Ratios 2 (L) 10.4 - 31.8 %   UIBC 357 ug/dL    Comment:  Performed at [facility], [place] [address], [place], [place] [address]  Ferritin     Status: Abnormal   Collection Time: 01/06/18  6:01 AM  Result Value Ref Range   Ferritin 5 (L) 11 - 307 ng/mL    Comment: Performed at [facility], [place] [address] [place], [place] [address]  Reticulocytes     Status: Abnormal   Collection Time: 01/06/18  6:01 AM  Result Value Ref Range   Retic Ct Pct 3.1 0.4 - 3.1 %   RBC. 1.87 (L) 3.87 - 5.11 MIL/uL   Retic Count, Absolute 58.0 19.0 - 186.0 K/uL   Immature Retic Fract 18.7 (H) 2.3 - 15.9 %    Comment: Performed at [facility] [address] [place], [place] [address]  Urinalysis, Routine w reflex microscopic     Status: Abnormal   Collection Time: 01/06/18  6:45 AM  Result Value Ref Range   Color, Urine YELLOW YELLOW   APPearance CLOUDY (A) CLEAR   Specific Gravity, Urine 1.013 1.005 - 1.030   pH 5.0 5.0 - 8.0   Glucose, UA 50 (A) NEGATIVE mg/dL   Hgb urine dipstick SMALL (A) NEGATIVE   Bilirubin Urine NEGATIVE NEGATIVE   Ketones, ur NEGATIVE NEGATIVE mg/dL   Protein, ur 100 (A) NEGATIVE mg/dL   Nitrite NEGATIVE NEGATIVE   Leukocytes, UA MODERATE (A) NEGATIVE   RBC / HPF 0-5 0 - 5 RBC/hpf   WBC, UA >50 (H) 0 - 5 WBC/hpf   Bacteria, UA MANY (A) NONE SEEN   Squamous Epithelial / LPF 0-5 0 - 5   WBC Clumps PRESENT     Comment: Performed at [facility], [place] [address], [place], [place] [address]  CBG monitoring, ED     Status: Abnormal   Collection Time: 01/06/18  1:25 PM  Result Value Ref Range   Glucose-Capillary 207 (H) 70 - 99 mg/dL  MRSA PCR Screening     Status: None   Collection Time: 01/06/18  4:53 PM  Result Value Ref Range   MRSA by PCR NEGATIVE NEGATIVE    Comment:        The GeneXpert MRSA Assay (FDA approved for NASAL specimens only), is one component of a comprehensive MRSA colonization surveillance program. It is not intended to diagnose MRSA infection nor to guide or monitor treatment  for MRSA infections. Performed at [facility], [place] [address], [place], [place] [address]   Glucose, capillary     Status: Abnormal   Collection Time: 01/06/18  5:33 PM  Result Value Ref Range   Glucose-Capillary 68 (L) 70 - 99 mg/dL  Glucose, capillary     Status: None   Collection Time: 01/06/18  6:05 PM  Result Value Ref Range   Glucose-Capillary 77 70 - 99 mg/dL  HIV antibody (Routine Testing)     Status: None   Collection Time: 01/06/18  6:16 PM  Result Value Ref Range   HIV Screen 4th Generation wRfx Non Reactive Non Reactive    Comment: (NOTE) Performed At: [facility] [place], [place] [id number] Rush Farmer MD [id number]   Troponin I - Now Then Q6H     Status: Abnormal   Collection Time: 01/06/18  6:16 PM  Result Value Ref Range   Troponin I 0.03 (HH) <0.03 ng/mL    Comment: CRITICAL  VALUE NOTED.  VALUE IS CONSISTENT WITH PREVIOUSLY REPORTED AND CALLED VALUE. Performed at [facility], [place] [address], [place], [place] [address]   Hemoglobin and hematocrit, blood     Status: Abnormal   Collection Time: 01/06/18  6:16 PM  Result Value Ref Range   Hemoglobin 7.6 (L) 12.0 - 15.0 g/dL    Comment: REPEATED TO VERIFY POST TRANSFUSION SPECIMEN    HCT 25.4 (L) 36.0 - 46.0 %    Comment: Performed at [facility] [address], [place], [place] [address]  Glucose, capillary     Status: Abnormal   Collection Time: 01/06/18  9:10 PM  Result Value Ref Range   Glucose-Capillary 115 (H) 70 - 99 mg/dL  Troponin I - Now Then Q6H     Status: Abnormal   Collection Time: 01/07/18 12:27 AM  Result Value Ref Range   Troponin I 0.03 (HH) <0.03 ng/mL    Comment: CRITICAL VALUE NOTED.  VALUE IS CONSISTENT WITH PREVIOUSLY REPORTED AND CALLED VALUE. Performed at [facility], [place] [address], [place], [place] [address]   Basic metabolic panel     Status: Abnormal   Collection Time: 01/07/18  6:39 AM  Result Value Ref Range    Sodium 142 135 - 145 mmol/L   Potassium 3.3 (L) 3.5 - 5.1 mmol/L   Chloride 115 (H) 98 - 111 mmol/L   CO2 16 (L) 22 - 32 mmol/L   Glucose, Bld 93 70 - 99 mg/dL   BUN 19 8 - 23 mg/dL   Creatinine, Ser 2.03 (H) 0.44 - 1.00 mg/dL   Calcium 9.3 8.9 - 10.3 mg/dL   GFR calc non Af Amer 25 (L) >60 mL/min   GFR calc Af Amer 29 (L) >60 mL/min   Anion gap 11 5 - 15    Comment: Performed at [facility] [address] [place], [place] [address]  CBC     Status: Abnormal   Collection Time: 01/07/18  6:39 AM  Result Value Ref Range   WBC 9.9 4.0 - 10.5 K/uL   RBC 3.55 (L) 3.87 - 5.11 MIL/uL   Hemoglobin 8.8 (L) 12.0 - 15.0 g/dL   HCT 29.8 (L) 36.0 - 46.0 %   MCV 83.9 80.0 - 100.0 fL   MCH 24.8 (L) 26.0 - 34.0 pg   MCHC 29.5 (L) 30.0 - 36.0 g/dL   RDW 14.6 11.5 - 15.5 %   Platelets 364 150 - 400 K/uL   nRBC 0.0 0.0 - 0.2 %    Comment: Performed at [facility], [place] [address] [place], [place] [address]  Protime-INR     Status: None   Collection Time: 01/07/18  6:39 AM  Result Value Ref Range   Prothrombin Time 14.1 11.4 - 15.2 seconds   INR 1.10     Comment: Performed at [facility] [address] [place], [place] [address]    Dg Chest 2 View  Result Date: 01/06/2018 CLINICAL DATA:  67 year old female with dizziness and shortness of breath. EXAM: CHEST - 2 VIEW COMPARISON:  Chest CT dated 11/24/2016 FINDINGS: There is slight hyperexpansion of the lungs and flattening of the diaphragms likely related to underlying air trapping and emphysema. No focal consolidation, pleural effusion, or pneumothorax. The cardiac silhouette is within normal limits. No acute osseous pathology. IMPRESSION: No active cardiopulmonary disease. Emphysema. Electronically Signed   By: Anner Crete M.D.   On: 01/06/2018 05:11  Ct Head Wo Contrast  Result Date: 01/06/2018 CLINICAL DATA:  67 year old female with dizziness and generalized weakness. EXAM: CT HEAD WITHOUT CONTRAST TECHNIQUE:  Contiguous axial images were obtained from the base of the skull through the vertex without intravenous contrast. COMPARISON:  Head CT dated 02/22/2015 FINDINGS: Brain: The ventricles and sulci appropriate size for patient's age. The gray-white matter discrimination is preserved. There is no acute intracranial hemorrhage. No mass effect or midline shift. No extra-axial fluid collection. Vascular: No hyperdense vessel or unexpected calcification. Skull: Normal. Negative for fracture or focal lesion. Sinuses/Orbits: No acute finding. Other: None IMPRESSION: No acute intracranial pathology. Electronically Signed   By: Anner Crete M.D.   On: 01/06/2018 05:30   Cardiac studies: EKG 01/07/2018: Sinus rhythm. LAW, PVC.  Hospital echocardiogram 01/06/2017: - Left ventricle: The cavity size was normal. There was mild   concentric hypertrophy. Systolic function was normal. The   estimated ejection fraction was in the range of 60% to 65%. Wall   motion was normal; there were no regional wall motion   abnormalities. Features are consistent with a pseudonormal left   ventricular filling pattern, with concomitant abnormal relaxation   and increased filling pressure (grade 2 diastolic dysfunction).   Doppler parameters are consistent with elevated ventricular   end-diastolic filling pressure. - Aortic valve: Trileaflet; mildly thickened, mildly calcified   leaflets. Transvalvular velocity was within the normal range.   There was no stenosis. There was mild regurgitation. Valve area   (VTI): 2.45 cm^2. Valve area (Vmax): 2.25 cm^2. Valve area   (Vmean): 2.24 cm^2. - Mitral valve: There was trivial regurgitation. - Left atrium: The atrium was mildly to moderately dilated. - Right ventricle: Systolic function was normal. - Right atrium: The atrium was moderately dilated. - Tricuspid valve: Transvalvular velocity was within the normal   range. There was moderate regurgitation. - Pulmonary arteries: The  main pulmonary artery was normal-sized.   Systolic pressure was severely increased. PA peak pressure: 75 mm   Hg (S).  Impressions:  - LVEF 55-60%. Grade 2 diastolic dysfunction with elevated filling   pressures. Severe pulmonary hypertension with RVSP 75 mmHg   (previously normal on the study from 11/25/2016).  Hospital echocardiogram 11/25/2016:  Study Conclusions  - Left ventricle: The cavity size was normal. Wall thickness was   increased in a pattern of moderate LVH. Systolic function was   normal. The estimated ejection fraction was in the range of 55%   to 60%. Wall motion was normal; there were no regional wall   motion abnormalities. Doppler parameters are consistent with   abnormal left ventricular relaxation (grade 1 diastolic   dysfunction). - Mitral valve: Calcified annulus. - Left atrium: The atrium was mildly dilated.  Impressions:  - Normal LV systolic function; moderate LVH; mild diastolic   dysfunction; mild LAE; mild TR.   Review of Systems  Constitutional: Positive for malaise/fatigue.  HENT: Negative.   Eyes: Negative.   Respiratory: Positive for shortness of breath.   Cardiovascular: Positive for leg swelling. Negative for chest pain, palpitations, orthopnea and PND.  Gastrointestinal: Positive for constipation. Negative for blood in stool (Although FOBT positive).  Genitourinary: Negative.   Musculoskeletal: Negative.   Skin: Negative.   Neurological: Positive for dizziness. Negative for loss of consciousness.  Endo/Heme/Allergies: Does not bruise/bleed easily.  All other systems reviewed and are negative.  Blood pressure (!) 193/77, pulse 73, temperature 97.9 F (36.6 C), temperature source Oral, resp. rate 18, height '5\' 6"'$  (1.676 m), weight 66.7  kg, SpO2 98 %. Physical Exam  Nursing note and vitals reviewed. Constitutional: She is oriented to person, place, and time. She appears well-developed and well-nourished. No distress.  HENT:   Head: Normocephalic and atraumatic.  Eyes: Pupils are equal, round, and reactive to light. Conjunctivae are normal.  Neck: JVD present.  Cardiovascular: Normal rate and regular rhythm.  Murmur (Ejection systolic RUSB II/VI) heard. Respiratory: Effort normal and breath sounds normal. No respiratory distress. She has no wheezes. She has no rales.  GI: Soft. Bowel sounds are normal. There is no tenderness. There is no rebound.  Musculoskeletal: She exhibits edema (2+ Rt, 1+ left).  Lymphadenopathy:    She has no cervical adenopathy.  Neurological: She is alert and oriented to person, place, and time.  Skin: Skin is warm and dry.  Psychiatric: She has a normal mood and affect.    Assessment/Recommendations:  67 y/o [place] American female with uncontrolled hypertension, type 2 DM, depression, tobacco abuse, CKD 3/4, now with severe iron deficiency anemia, pulmonary hypertension.  Pulmonary hypertension: New diagnosis. Significant change in echocardiogram from 11/2016. Elevated BNP and mildly elevated stable trop. Acute precipitating factor could be severe anemia leading to high output state. Recommend workup and management of anemia, as you are doing. Patient also has uncontrolled hypertension, which could be contributing. After appropriate treatment of the above two, would repeat echocardiogram in 3 months. If reversible cause is severe anemia and high output state, I would expect to see improvement in her pulmonary pressures. If pulmonary hypertension persists, may need further workup with PFT, V/Q scan, HRCT, right heart catheterization. Consider ESR, CRP, ANA, HIV.  Hypertension: Currently on coreg 12.5 mg bid. Recommend adding diuretic to help manage hypertension in possible high output state. Recommend lasix 40 mg PO daily. Monitor renal function.  Severe anemia: As per primary team and GI recommendations.  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 Management as per the primary  team.  Reynold Bowen Lanita Stammen 01/07/2018, 7:43 AM   Nigel Mormon, MD [facility] Pager: [phone] Office: [phone] If no answer Cell [phone]

## 2018-01-07 NOTE — Anesthesia Procedure Notes (Signed)
Procedure Name: General with mask airway Date/Time: 01/07/2018 12:18 PM Performed by: Renato Shin, CRNA Pre-anesthesia Checklist: Patient identified, Emergency Drugs available, Suction available and Patient being monitored Patient Re-evaluated:Patient Re-evaluated prior to induction Oxygen Delivery Method: Nasal cannula Preoxygenation: Pre-oxygenation with 100% oxygen Induction Type: IV induction Placement Confirmation: positive ETCO2 and breath sounds checked- equal and bilateral Dental Injury: Teeth and Oropharynx as per pre-operative assessment

## 2018-01-07 NOTE — Interval H&P Note (Signed)
History and Physical Interval Note:  01/07/2018 11:02 AM  Sara Davis  has presented today for surgery, with the diagnosis of Iron deficiency anemia.  Loss of appetite, 10 to 20 pound weight loss in > 1 year  The various methods of treatment have been discussed with the patient and family. After consideration of risks, benefits and other options for treatment, the patient has consented to  Procedure(s): COLONOSCOPY WITH PROPOFOL (N/A) ESOPHAGOGASTRODUODENOSCOPY (EGD) WITH PROPOFOL (N/A) as a surgical intervention .  The patient's history has been reviewed, patient examined, no change in status, stable for surgery.  I have reviewed the patient's chart and labs.  Questions were answered to the patient's satisfaction.     Scarlette Shorts

## 2018-01-07 NOTE — Progress Notes (Signed)
Family Medicine Teaching Service Daily Progress Note Intern Pager: [phone]  Patient name: Sara Davis Medical record number: [id number] Date of birth: [birth year] Age: 67 y.o. Gender: female  Primary Care Provider: [name], [name], DO Consultants: cardiology, gastroenterology Code Status: Full code  Pt Overview and Major Events to Date:  Hospital Day 1 Admitted: 01/06/2018   Assessment and Plan: Sara Davis is a 67 y.o. female who presented with weakness, SOB concerning for anemia (hgb 4.6). PMH is significant for CHF, anemia, T2DM, HTN and tobacco use.   Severe anemia - SOB symptoms for one month. Hgb was 4.5 in ED and she received 3 U pRBC. Current hgb8.8 . Patient arrived with VSS and no respiratory distress on exam. Significant labs include low ferritin and BNP 1097.  Patient reporting intermittent black stools, not taking NSAIDs at home..  trops 0.03> 0.03. Negative FOBT. EKG showed ST depression. This morning she feels much better.  Patient reports having black stools. She will be getting a upper and lower endoscopy today.  - GI consulted, appreciate recs.  - f/u egd and colonoscopy - AM CBCs - echo - SCDs for prophylaxis - pm H&H  CHF - most recent ECHo in 11/2016 showed 55-60% EF. Yesterdays echo showed EF 60-65%. No wall abnormalities, grade 2 diastolic dysfunction, severepulmonary hypertension (RVSP 51mmHg/previously normal). .Home meds are cored 12.5 BID, 20mg  lasix. Patient reporting worsening leg swelling and SOB. On exam she has no pitting edema and no inspiratory crackles.  Patient is on room air, HR ~100. Cardiology has been consulted.  - f/u cardiology recs  - ECHO - monitor fluid status.   UTI - on admission has mod. LE, neg nitrite, many bact, > 50 wbc. . Given CTX x 1 IV in ED. Pt asymptomatic.  holdilng abx. Also significant for 100 protein.  - follow urine Cx  AKI - Cr 2.36 up from baseline 1.4-1.8.   - daily BMP - avoid nephrotoxic agents.   DM-II   05/2017 a1c is 9.9.  Today is 6.6%. Home meds metformin 1000 BID, and novolin 70/30 14 U TID.  BG 68> 77> 115.   - sSSI  HTN - BP 148/78 on arrival. Currently is 175/69.  At home on coreg 12.5 mg BID - adding amlodipine 10mg    HLD - at home lipitor 20mg  daily - continue home lipitor  Tobacco use - 12 ppd since 67 yo.  CXR notable for possible emphysema. No diagnosis of COPD, may need PFT outpatient.  - transdermal nictoine patch 14mg .   Fluids: no Electrolytes: replete PRN  Nutrition: carb controlled diet GI ppx: none DVT px: none  Disposition: home   Medications: Scheduled Meds: . atorvastatin  20 mg Oral Daily  . carvedilol  12.5 mg Oral BID  . insulin aspart  0-9 Units Subcutaneous TID WC  . nicotine  14 mg Transdermal Daily   Continuous Infusions: PRN Meds: acetaminophen **OR** acetaminophen, polyethylene glycol  ================================================= ================================================= Subjective:  Patient has no complaints this morning.  She feels much better than the day before.  She reports having intermittent black stools previously. She understands she needs to get a colonoscopy to check for source of bleeding.  She has not had any vaginal bleeding.     Objective: Temp:  [97.6 F (36.4 C)-98.6 F (37 C)] 97.9 F (36.6 C) (12/06 0356) Pulse Rate:  [67-92] 71 (12/06 0356) Resp:  [13-30] 18 (12/06 0356) BP: (139-215)/(51-158) 175/69 (12/06 0356) SpO2:  [95 %-100 %] 97 % (12/06 0356) Weight:  [  66.7 kg-66.8 kg] 66.7 kg (12/06 0356) Intake/Output 12/05 0701 - 12/06 0700 In: 2745 [P.O.:2000; Blood:645; IV Piggyback:100] Out: -  Physical Exam:  Gen: NAD, alert, non-toxic, well-nourished, well-appearing, sitting comfortably  HEENT: Normocephaic, atraumatic. Clear conjuctiva, no scleral icterus and injection.  CV: Regular rate and rhythm.  Normal S1-S2.   Normal capillary refill bilaterally.  Radial pulses 2+ bilaterally. No bilateral lower  extremity edema. Resp: Clear to auscultation bilaterally.  No wheezing, rales, abnormal lung sounds.  No increased work of breathing appreciated. Abd: Nontender and nondistended on palpation to all 4 quadrants.  Positive bowel sounds. Psych: Cooperative with exam. Pleasant. Makes eye contact. Extremities: Full ROM. Right leg slightly larger than left, though neither appear overtly swollen.    Laboratory: Recent Labs  Lab 01/05/18 1206 01/06/18 0427 01/06/18 1816  WBC 9.3 8.3  --   HGB 4.6* 4.5* 7.6*  HCT 16.0* 16.5* 25.4*  PLT 407 362  --    Recent Labs  Lab 01/05/18 1206 01/06/18 0427  NA 142 142  K 4.4 3.7  CL 111* 111  CO2 18* 21*  BUN 23 26*  CREATININE 2.26* 2.36*  CALCIUM 9.6 9.3  PROT 5.6*  --   BILITOT <0.2  --   ALKPHOS 87  --   ALT 9  --   AST 10  --   GLUCOSE 173* 195*    Imaging/Diagnostic Tests: Dg Chest 2 View  Result Date: 01/06/2018 CLINICAL DATA:  67 year old female with dizziness and shortness of breath. EXAM: CHEST - 2 VIEW COMPARISON:  Chest CT dated 11/24/2016 FINDINGS: There is slight hyperexpansion of the lungs and flattening of the diaphragms likely related to underlying air trapping and emphysema. No focal consolidation, pleural effusion, or pneumothorax. The cardiac silhouette is within normal limits. No acute osseous pathology. IMPRESSION: No active cardiopulmonary disease. Emphysema. Electronically Signed   By: Anner Crete M.D.   On: 01/06/2018 05:11   Ct Head Wo Contrast  Result Date: 01/06/2018 CLINICAL DATA:  67 year old female with dizziness and generalized weakness. EXAM: CT HEAD WITHOUT CONTRAST TECHNIQUE: Contiguous axial images were obtained from the base of the skull through the vertex without intravenous contrast. COMPARISON:  Head CT dated 02/22/2015 FINDINGS: Brain: The ventricles and sulci appropriate size for patient's age. The gray-white matter discrimination is preserved. There is no acute intracranial hemorrhage. No mass  effect or midline shift. No extra-axial fluid collection. Vascular: No hyperdense vessel or unexpected calcification. Skull: Normal. Negative for fracture or focal lesion. Sinuses/Orbits: No acute finding. Other: None IMPRESSION: No acute intracranial pathology. Electronically Signed   By: Anner Crete M.D.   On: 01/06/2018 05:30      Benay Pike, MD 01/07/2018, 6:10 AM PGY-1, [place] Intern pager: [phone], text pages welcome

## 2018-01-07 NOTE — Op Note (Signed)
[facility] Patient Name: Sara Davis Procedure Date : 01/07/2018 MRN: [id number] Attending MD: Docia Chuck. Henrene Pastor , MD Date of Birth: Nov 02, 1950 CSN: [id number] Age: 67 Admit Type: Inpatient Procedure:                Colonoscopy snare polypectomy x 3 Indications:              Iron deficiency anemia Providers:                Docia Chuck. Henrene Pastor, MD, Jobe Igo, RN, Cherylynn Ridges, Technician Referring MD:             [facility] hospitalist Medicines:                Monitored Anesthesia Care Complications:            No immediate complications. Estimated blood loss:                            None. Estimated Blood Loss:     Estimated blood loss: none. Procedure:                Pre-Anesthesia Assessment:                           - Prior to the procedure, a History and Physical                            was performed, and patient medications and                            allergies were reviewed. The patient's tolerance of                            previous anesthesia was also reviewed. The risks                            and benefits of the procedure and the sedation                            options and risks were discussed with the patient.                            All questions were answered, and informed consent                            was obtained. Prior Anticoagulants: The patient has                            taken no previous anticoagulant or antiplatelet                            agents. ASA Grade Assessment: III - A patient with  severe systemic disease. After reviewing the risks                            and benefits, the patient was deemed in                            satisfactory condition to undergo the procedure.                           After obtaining informed consent, the colonoscope                            was passed under direct vision. Throughout the                            procedure, the  patient's blood pressure, pulse, and                            oxygen saturations were monitored continuously. The                            Colonoscope was introduced through the anus and                            advanced to the the cecum, identified by                            appendiceal orifice and ileocecal valve. The                            ileocecal valve, appendiceal orifice, and rectum                            were photographed. The quality of the bowel                            preparation was good. The colonoscopy was performed                            without difficulty. The patient tolerated the                            procedure well. The bowel preparation used was                            MoviPrep. Scope In: 96:22:29 PM Scope Out: 12:48:10 PM Scope Withdrawal Time: 0 hours 21 minutes 21 seconds  Total Procedure Duration: 0 hours 31 minutes 59 seconds  Findings:      Three polyps were found in the sigmoid colon and transverse colon. The       polyps were 4 to 8 mm in size. These polyps were removed with a cold       snare. Resection and retrieval were complete.      The exam was otherwise without abnormality on direct and retroflexion  views. Impression:               - Three 4 to 8 mm polyps in the sigmoid colon and                            in the transverse colon, removed with a cold snare.                            Resected and retrieved.                           - The examination was otherwise normal on direct                            and retroflexion views. Recommendation:           - Repeat colonoscopy in 5-10 years for                            surveillance, based on final pathology.                           - Patient has a contact number available for                            emergencies. The signs and symptoms of potential                            delayed complications were discussed with the                            patient.  Return to normal activities tomorrow.                            Written discharge instructions were provided to the                            patient.                           - Resume previous diet.                           - Continue present medications.                           - Await pathology results.                           - EGD today. Please see report for findings and                            final recommendations Procedure Code(s):        --- Professional ---                           [phone], Colonoscopy, flexible;  with removal of                            tumor(s), polyp(s), or other lesion(s) by snare                            technique Diagnosis Code(s):        --- Professional ---                           D12.5, Benign neoplasm of sigmoid colon                           D12.3, Benign neoplasm of transverse colon (hepatic                            flexure or splenic flexure)                           D50.9, Iron deficiency anemia, unspecified CPT copyright 2018 American Medical Association. All rights reserved. The codes documented in this report are preliminary and upon coder review may  be revised to meet current compliance requirements. Docia Chuck. Henrene Pastor, MD 01/07/2018 12:50:27 PM This report has been signed electronically. Number of Addenda: 0

## 2018-01-07 NOTE — Transfer of Care (Signed)
Immediate Anesthesia Transfer of Care Note  Patient: Sara Davis  Procedure(s) Performed: COLONOSCOPY WITH PROPOFOL (N/A ) ESOPHAGOGASTRODUODENOSCOPY (EGD) WITH PROPOFOL (N/A ) POLYPECTOMY  Patient Location: PACU and Endoscopy Unit  Anesthesia Type:MAC  Level of Consciousness: awake, drowsy and patient cooperative  Airway & Oxygen Therapy: Patient Spontanous Breathing and Patient connected to nasal cannula oxygen  Post-op Assessment: Report given to RN and Post -op Vital signs reviewed and stable  Post vital signs: Reviewed and stable  Last Vitals:  Vitals Value Taken Time  BP 155/80 01/07/2018  1:11 PM  Temp 36.7 C 01/07/2018  1:11 PM  Pulse 72 01/07/2018  1:12 PM  Resp 16 01/07/2018  1:12 PM  SpO2 93 % 01/07/2018  1:12 PM  Vitals shown include unvalidated device data.  Last Pain:  Vitals:   01/07/18 1311  TempSrc: Oral  PainSc: 0-No pain         Complications: No apparent anesthesia complications

## 2018-01-08 ENCOUNTER — Inpatient Hospital Stay (HOSPITAL_COMMUNITY): Payer: Medicare HMO

## 2018-01-08 DIAGNOSIS — M7989 Other specified soft tissue disorders: Secondary | ICD-10-CM

## 2018-01-08 LAB — CBC WITH DIFFERENTIAL/PLATELET
Abs Immature Granulocytes: 0.05 10*3/uL (ref 0.00–0.07)
Basophils Absolute: 0 10*3/uL (ref 0.0–0.1)
Basophils Relative: 0 %
Eosinophils Absolute: 0.2 10*3/uL (ref 0.0–0.5)
Eosinophils Relative: 2 %
HCT: 29.9 % — ABNORMAL LOW (ref 36.0–46.0)
Hemoglobin: 9.1 g/dL — ABNORMAL LOW (ref 12.0–15.0)
Immature Granulocytes: 1 %
Lymphocytes Relative: 21 %
Lymphs Abs: 2 10*3/uL (ref 0.7–4.0)
MCH: 25.6 pg — ABNORMAL LOW (ref 26.0–34.0)
MCHC: 30.4 g/dL (ref 30.0–36.0)
MCV: 84.2 fL (ref 80.0–100.0)
Monocytes Absolute: 0.7 10*3/uL (ref 0.1–1.0)
Monocytes Relative: 7 %
NEUTROS PCT: 69 %
Neutro Abs: 6.5 10*3/uL (ref 1.7–7.7)
Platelets: 374 10*3/uL (ref 150–400)
RBC: 3.55 MIL/uL — ABNORMAL LOW (ref 3.87–5.11)
RDW: 15.3 % (ref 11.5–15.5)
WBC: 9.5 10*3/uL (ref 4.0–10.5)
nRBC: 0 % (ref 0.0–0.2)

## 2018-01-08 LAB — GLUCOSE, CAPILLARY
Glucose-Capillary: 233 mg/dL — ABNORMAL HIGH (ref 70–99)
Glucose-Capillary: 124 mg/dL — ABNORMAL HIGH (ref 70–99)
Glucose-Capillary: 252 mg/dL — ABNORMAL HIGH (ref 70–99)
Glucose-Capillary: 139 mg/dL — ABNORMAL HIGH (ref 70–99)

## 2018-01-08 LAB — BASIC METABOLIC PANEL
Anion gap: 8 (ref 5–15)
BUN: 22 mg/dL (ref 8–23)
CO2: 22 mmol/L (ref 22–32)
Calcium: 8.8 mg/dL — ABNORMAL LOW (ref 8.9–10.3)
Chloride: 114 mmol/L — ABNORMAL HIGH (ref 98–111)
Creatinine, Ser: 2.5 mg/dL — ABNORMAL HIGH (ref 0.44–1.00)
GFR calc Af Amer: 22 mL/min — ABNORMAL LOW (ref >60)
GFR calc non Af Amer: 19 mL/min — ABNORMAL LOW (ref >60)
Glucose, Bld: 237 mg/dL — ABNORMAL HIGH (ref 70–99)
Potassium: 3.4 mmol/L — ABNORMAL LOW (ref 3.5–5.1)
SODIUM: 144 mmol/L (ref 135–145)

## 2018-01-08 LAB — URINE CULTURE: Culture: >=100000 — AB

## 2018-01-08 MED ORDER — AMLODIPINE BESYLATE 2.5 MG PO TABS
5.0000 mg | ORAL_TABLET | Freq: Every day | ORAL | Status: DC
  Administered 2018-01-08 – 2018-01-09 (×2): 5 mg via ORAL
  Filled 2018-01-08 (×2): qty 2

## 2018-01-08 MED ORDER — SENNA 8.6 MG PO TABS
1.0000 | ORAL_TABLET | Freq: Every day | ORAL | Status: DC
  Administered 2018-01-08 – 2018-01-09 (×2): 8.6 mg via ORAL
  Filled 2018-01-08 (×2): qty 1

## 2018-01-08 MED ORDER — ATORVASTATIN CALCIUM 40 MG PO TABS
40.0000 mg | ORAL_TABLET | Freq: Every day | ORAL | Status: DC
  Administered 2018-01-08: 40 mg via ORAL
  Filled 2018-01-08: qty 1

## 2018-01-08 MED ORDER — CEPHALEXIN 250 MG PO CAPS
250.0000 mg | ORAL_CAPSULE | Freq: Two times a day (BID) | ORAL | Status: DC
  Administered 2018-01-08 – 2018-01-09 (×2): 250 mg via ORAL
  Filled 2018-01-08 (×2): qty 1

## 2018-01-08 MED ORDER — CEPHALEXIN 250 MG PO CAPS: 500.0000 mg | ORAL_CAPSULE | Freq: Two times a day (BID) | ORAL | Status: DC

## 2018-01-08 MED ORDER — POTASSIUM CHLORIDE CRYS ER 20 MEQ PO TBCR
40.0000 meq | EXTENDED_RELEASE_TABLET | Freq: Two times a day (BID) | ORAL | Status: AC
  Administered 2018-01-08 (×2): 40 meq via ORAL
  Filled 2018-01-08 (×2): qty 2

## 2018-01-08 NOTE — Progress Notes (Signed)
No acute issues from cardiac standpoint. Unless difficulty in BP management or change in symptoms, please call us. We will set up outpatient visit with [place] in 4 weeks and f/u pulmonary hypertension due to high output HF.    Adrian Prows, MD 01/08/2018, 10:03 AM [facility]. [place] Pager: [phone] Office: [phone] If no answer: Cell:  [phone]

## 2018-01-08 NOTE — Progress Notes (Signed)
RLE venous duplex       has been completed. Preliminary results can be found under CV proc through chart review. Landry Mellow, RDMS, RVT

## 2018-01-08 NOTE — Progress Notes (Addendum)
Family Medicine Teaching Service Daily Progress Note Intern Pager: [phone]  Patient name: Sara Davis Medical record number: [id number] Date of birth: [birth year] Age: 67 y.o. Gender: female  Primary Care Provider: [name], [name], DO Consultants: Cardiology, Gastroenterology Code Status: Full  Pt Overview and Major Events to Date:  Admitted 01/06/2018 Upper Endoscopy 01/07/2018 Colonoscopy 01/07/2018   Assessment and Plan: JOHAN ANTONACCI is a 67 yo female who presented with weakness, shortness of breath and found to be profoundly anemic. PMH is significant for CHF, anemia, T2DM, HTN and tobacco use.  #Anemia, severe, improving Patient is s/p 3 units of pRBCs has significantly improved with stable blood count. 4.5>>7.6>>8.8>>9.7>>9.1. 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 Biopsy sample for H. Pylori testing. Findings most consistent with reflux esophagitis. Colonoscopy revealed 3 polyps (4-8 mm) that were resected in the sigmoid and transverse colon. Findings consistent with iron deficiency anemia noted on panel with low ferritin and iron saturation. Shortness of breath has significantly improved [place] oxygen discontinued and patient satting 100% on RA. --Appreciate GI recs --Start patient on pantoprazole 40 mg bid indefinitely --Start ferrous sulfate 325 mg bid indefinitely  --Follow up on pathology results for EGD and colonoscopy samples --Outpatient monitor of iron level and hemoglobin --Bowel regimen given initiation of iron supplementation --Avoid NSAIDs   #HFpEF, stable  Recent echo showed EF 55-60% with G2DD, moderate LVH and without wall motion abnormalities, however PA pressure of 75 mm Hg consistent with moderate to severe pulmonary HTN. Patient diuretic regimen has been modified by cardiology (see below). Weight is down 4 lbs since admission with UOP of net 2L in  the past 24 hours.   Assymetry in lower extremity swelling concerning for possible DVTs. --Appreciate Cardiology recs --Repeat Echo in 3 months, if no improvement patient will need further cardiac and pulmonary work up. --Start patient on Furosemide 40 mg po daily  --Continue Carvedilol 25 mg bid --Follow up BMP will be needed given furosemide initiation  --F/u on Lower extremity doppler  #Hypertension, uncontrolled, improving Initial BP elevated with SBP>200. BP has trended down throughout hospitalization. Patient started on furosemide in addition to current regimen. Today BP 172/67, expected BP will continue to improve if patient is adherent. --Continue Carvedilol 25 mg bid --Continue Furosemide 40 mg po daily --Consider ACE-I/ARBs for further control given T2DM if GFR continue to improve.   #Acute on chronic kidney disease, improving  Baseline creatinine between 1.4-1.8 but has been as high 2.5 (11/2016). Suspected uncontrolled T2DM, HTN and profound anemia worsening CHF and volume overload have contributed to worsening kidney function. Expect improvement in function with BP and CHF optimization. Creatinine 2.5 this am likely secondary to furosemide.  --Patient will need outpatient follow up with nephrology for close monitoring  --Avoid nephrotoxic agent --Follow up BMP  #UTI, treated UA on admission with mod Leuks, no nitrites and many bact with . 50 WBC. S/p 1 dose ceftriaxone. Urine culture showed > 100K colonies E. Coli. Likely sensitive to beta lactam however will follow up on sensitivities.  #T2DM A1c this admission 6.6 much improved from 9.9 back in April 2019. BG while inpatient have been well fairly well controlled on sensitive sliding scale. CBG this morning 237.  --Could consider starting small dose long acting insulin --Continue sensitive SSI --Hold Metformin 1000 mg bid --Hold Novolin 70/30 14u tid --Patient will definitely benefit from long acting insulin instead of  70/30  regimen. Would also consider GLP1/SGLT2 given cardiovascular disease. Discuss with PCP. Patient is part of [facility] and could be referred to their pharmacist to help to medication cost.  #Hyperlipidemia Current home regimen Atorvastatin 20 mg daily. ASCVD 24%. Patient need to be on higher dose given multiple risk factor, HTN, T2DM, smoker.  --Consider increasing dose to 40 mg daily with further titration as needed in the outpatient setting.  #Tobacco use - 12 ppd since 67 yo.  CXR notable for possible emphysema. No diagnosis of COPD, may need PFT outpatient.  - transdermal nicotine patch 14mg .   FEN/GI: Carb modified PPx: SCDs  Disposition: Home pending clinical improvement  Subjective:  Patient is feeling better this morning, fatigue has improved and she denies any shortness of breath. She is ambulating and taking good po.  Objective: Temp:  [97.7 F (36.5 C)-99 F (37.2 C)] 97.7 F (36.5 C) (12/07 0023) Pulse Rate:  [71-81] 71 (12/07 0023) Resp:  [16-30] 16 (12/07 0023) BP: (137-225)/(63-87) 137/66 (12/07 0023) SpO2:  [94 %-100 %] 100 % (12/07 0023) Weight:  [66.7 kg] 66.7 kg (12/06 0356)   Physical Exam: ,General: NAD, pleasant, able to participate in exam Cardiac: RRR, normal heart sounds, no murmurs. 2+ radial and PT pulses bilaterally Respiratory: CTAB, normal effort, No wheezes, rales or rhonchi Abdomen: soft, nontender, nondistended, no hepatic or splenomegaly, +BS Extremities: no edema or cyanosis. WWP. Skin: warm and dry, no rashes noted Neuro: alert and oriented x4, no focal deficits Psych: Normal affect and mood  Laboratory: Recent Labs  Lab 01/06/18 0427 01/06/18 1816 01/07/18 0639 01/07/18 1541  WBC 8.3  --  9.9 9.6  HGB 4.5* 7.6* 8.8* 9.7*  HCT 16.5* 25.4* 29.8* 31.9*  PLT 362  --  364 392   Recent Labs  Lab 01/05/18 1206 01/06/18 0427 01/07/18 0639  NA 142 142 142  K 4.4 3.7 3.3*  CL 111* 111 115*  CO2 18* 21* 16*  BUN 23 26* 19  CREATININE  2.26* 2.36* 2.03*  CALCIUM 9.6 9.3 9.3  PROT 5.6*  --   --   BILITOT <0.2  --   --   ALKPHOS 87  --   --   ALT 9  --   --   AST 10  --   --   GLUCOSE 173* 195* 93     Imaging/Diagnostic Tests: No results found.   Marjie Skiff, MD 01/08/2018, 12:34 AM PGY-3, [place] Intern pager: [phone], text pages welcome

## 2018-01-09 ENCOUNTER — Other Ambulatory Visit: Payer: Self-pay | Admitting: Student in an Organized Health Care Education/Training Program

## 2018-01-09 LAB — GLUCOSE, CAPILLARY
GLUCOSE-CAPILLARY: 203 mg/dL — AB (ref 70–99)
Glucose-Capillary: 200 mg/dL — ABNORMAL HIGH (ref 70–99)

## 2018-01-09 LAB — BASIC METABOLIC PANEL
Anion gap: 10 (ref 5–15)
BUN: 18 mg/dL (ref 8–23)
CO2: 20 mmol/L — ABNORMAL LOW (ref 22–32)
Calcium: 9 mg/dL (ref 8.9–10.3)
Chloride: 112 mmol/L — ABNORMAL HIGH (ref 98–111)
Creatinine, Ser: 2.18 mg/dL — ABNORMAL HIGH (ref 0.44–1.00)
GFR calc Af Amer: 26 mL/min — ABNORMAL LOW (ref >60)
GFR calc non Af Amer: 23 mL/min — ABNORMAL LOW (ref >60)
Glucose, Bld: 228 mg/dL — ABNORMAL HIGH (ref 70–99)
Potassium: 3.9 mmol/L (ref 3.5–5.1)
Sodium: 142 mmol/L (ref 135–145)

## 2018-01-09 LAB — CBC
HCT: 28.3 % — ABNORMAL LOW (ref 36.0–46.0)
Hemoglobin: 8.6 g/dL — ABNORMAL LOW (ref 12.0–15.0)
MCH: 25.7 pg — ABNORMAL LOW (ref 26.0–34.0)
MCHC: 30.4 g/dL (ref 30.0–36.0)
MCV: 84.5 fL (ref 80.0–100.0)
Platelets: 329 10*3/uL (ref 150–400)
RBC: 3.35 MIL/uL — ABNORMAL LOW (ref 3.87–5.11)
RDW: 15.6 % — ABNORMAL HIGH (ref 11.5–15.5)
WBC: 9.4 10*3/uL (ref 4.0–10.5)
nRBC: 0 % (ref 0.0–0.2)

## 2018-01-09 MED ORDER — FUROSEMIDE 40 MG PO TABS: 40.0000 mg | ORAL_TABLET | Freq: Every day | ORAL | 0 refills | Status: DC

## 2018-01-09 MED ORDER — CARVEDILOL 25 MG PO TABS: 25.0000 mg | ORAL_TABLET | Freq: Two times a day (BID) | ORAL | 0 refills | Status: DC

## 2018-01-09 MED ORDER — CEPHALEXIN 250 MG PO CAPS: 250.0000 mg | ORAL_CAPSULE | Freq: Two times a day (BID) | ORAL | 0 refills | Status: AC

## 2018-01-09 MED ORDER — PANTOPRAZOLE SODIUM 40 MG PO TBEC: 40.0000 mg | DELAYED_RELEASE_TABLET | Freq: Every day | ORAL | 0 refills | Status: DC

## 2018-01-09 MED ORDER — ATORVASTATIN CALCIUM 40 MG PO TABS: 40.0000 mg | ORAL_TABLET | Freq: Every day | ORAL | 0 refills | Status: DC

## 2018-01-09 MED ORDER — AMLODIPINE BESYLATE 5 MG PO TABS: 5.0000 mg | ORAL_TABLET | Freq: Every day | ORAL | 0 refills | Status: DC

## 2018-01-09 MED ORDER — FERROUS SULFATE 325 (65 FE) MG PO TABS: 325.0000 mg | ORAL_TABLET | Freq: Two times a day (BID) | ORAL | 0 refills | Status: DC

## 2018-01-09 NOTE — Discharge Instructions (Signed)
You were treated for anemia while you are in the hospital.  Your blood level has improved, but it is still low, so it is very important that you follow-up in our clinic on 12/11 at 11 AM.  Because we saw some sources of blood loss in your GI tract, we have changed some of your medications.  We will need to take your iron pill twice a day and a new medicine called Protonix once daily to protect your stomach from acid secretion.  Please do not take any NSAIDs, which include Aleve, ibuprofen, Motrin, Advil, and naproxen.  We have also made some other changes to your medications while you have been here.  We are adding a blood pressure medicine called amlodipine.  Please take this once a day.  Please also take carvedilol 2 times per day and furosemide 40 mg once daily for your blood pressure.    For your UTI, please continue to take Keflex 2 times per day, with your last day of antibiotics on 12/12.

## 2018-01-09 NOTE — Progress Notes (Addendum)
Family Medicine Teaching Service Daily Progress Note Intern Pager: [phone]  Patient name: Sara Davis Medical record number: [id number] Date of birth: Jun 02, 1950 Age: 67 y.o. Gender: female  Primary Care Provider: [name], [name], DO Consultants: Cardiology, Gastroenterology Code Status: Full  Pt Overview and Major Events to Date:  Admitted 01/06/2018, received 3 units pRBCs Upper Endoscopy 01/07/2018 Colonoscopy 01/07/2018   Assessment and Plan: Sara Davis is a 67 yo female who presented with weakness, shortness of breath and found to be profoundly anemic. PMH is significant for CHF, anemia, T2DM, HTN and tobacco use.  #Anemia, severe, improved Iron deficiency anemia in the setting of multiple causes for GI bleeding as seen on endoscopy.  Hemoglobin on presentation 4.5, improved to 9.1 after transfusion of 3 units pRBCs.  Hemoglobin 12/8 is stable at 8.6.  GI recommendations say that she does not need further follow-up with them but should follow-up on pathology report from colonoscopy. --Continue patient on pantoprazole 40 mg bid indefinitely --Continue ferrous sulfate 325 mg bid indefinitely  --Follow up on pathology results for EGD and colonoscopy samples --Outpatient monitor of iron level and hemoglobin, has follow-up appointment scheduled --Bowel regimen given initiation of iron supplementation --Avoid NSAIDs   #HFpEF, stable  Recent echo showed EF 55-60% with G2DD, moderate LVH and without wall motion abnormalities, however PA pressure of 75 mm Hg consistent with moderate to severe pulmonary HTN. Patient diuretic regimen has been modified by cardiology (see below).  Asymmetric lower extremities concerning for DVT, however lower extremity Dopplers were negative.  Cardiology does not think she is experiencing any acute cardiac issues. --Repeat Echo in 3 months, if no improvement patient will need further cardiac and pulmonary work up. --Continue patient on Furosemide 40 mg po  daily  --Continue Carvedilol 25 mg bid  #Hypertension, uncontrolled, improving Initial BP elevated with SBP>200. BP has trended down throughout hospitalization. Patient started on furosemide in addition to current regimen. Today BP 166/70, expected BP will continue to improve if patient is adherent. --Continue amlodipine 5 mg daily --Continue Carvedilol 25 mg bid --Continue Furosemide 40 mg po daily --Consider ACE-I/ARBs for further control given T2DM if GFR continue to improve, likely outpatient.   #Acute on chronic kidney disease, improving  Baseline creatinine between 1.4-1.8 but has been as high 2.5 (11/2016). Suspected uncontrolled T2DM, HTN and profound anemia worsening CHF and volume overload have contributed to worsening kidney function. Expect improvement in function with BP and CHF optimization. Creatinine improved to 2.18 on 12/8. --Patient will need outpatient follow up with nephrology for close monitoring  --Avoid nephrotoxic agents  #UTI UA on admission with mod Leuks, no nitrites and many bact with . 50 WBC. S/p 1 dose ceftriaxone. Urine culture showed > 100K colonies pansensitive E. Coli. --continue Keflex 250 mg BID through 12/12  #T2DM A1c this admission 6.6 much improved from 9.9 back in April 2019. BG while inpatient have been well fairly well controlled on sensitive sliding scale. CBG this morning 200.  --Continue sensitive SSI --Hold Metformin 1000 mg bid --Hold Novolin 70/30 14u tid --Patient will definitely benefit from long acting insulin instead of 70/30 regimen. Would also consider GLP1/SGLT2 given cardiovascular disease. Discuss with PCP. Patient is part of [facility] and could be referred to their pharmacist to help to medication cost.  #Hyperlipidemia Current home regimen Atorvastatin 20 mg daily. ASCVD 24%. Patient need to be on higher dose given multiple risk factor, HTN, T2DM, smoker.  --Consider increasing dose to 40 mg daily with further titration as  needed in  the outpatient setting.  #Tobacco use - 12 ppd since 67 yo.  CXR notable for possible emphysema. No diagnosis of COPD, may need PFT outpatient.  - transdermal nicotine patch 14mg .   FEN/GI: Carb modified PPx: SCDs  Disposition: Home 12/8 due to clinical improvement  Subjective:  Patient is feeling good this morning and would like to go home today.  She denies any concerns or pain.  Objective: Temp:  [97.3 F (36.3 C)-97.7 F (36.5 C)] 97.7 F (36.5 C) (12/07 2026) Pulse Rate:  [65-70] 70 (12/07 2116) Resp:  [16-19] 18 (12/07 2116) BP: (172-192)/(62-73) 179/68 (12/07 2116) SpO2:  [97 %-100 %] 100 % (12/07 2116) Weight:  [65.1 kg] 65.1 kg (12/07 0634)   Physical Exam: General: NAD, pleasant, appears comfortable sitting on edge of the bed Cardiac: RRR, no MRG Respiratory: CTAB, normal work of breathing, no wheezing Abdomen: Soft, nontender, nondistended Extremities: Moves all extremities spontaneously, no pedal edema Psych: Normal affect and mood  Laboratory: Recent Labs  Lab 01/07/18 0639 01/07/18 1541 01/08/18 0702  WBC 9.9 9.6 9.5  HGB 8.8* 9.7* 9.1*  HCT 29.8* 31.9* 29.9*  PLT 364 392 374   Recent Labs  Lab 01/05/18 1206 01/06/18 0427 01/07/18 0639 01/08/18 0702  NA 142 142 142 144  K 4.4 3.7 3.3* 3.4*  CL 111* 111 115* 114*  CO2 18* 21* 16* 22  BUN 23 26* 19 22  CREATININE 2.26* 2.36* 2.03* 2.50*  CALCIUM 9.6 9.3 9.3 8.8*  PROT 5.6*  --   --   --   BILITOT <0.2  --   --   --   ALKPHOS 87  --   --   --   ALT 9  --   --   --   AST 10  --   --   --   GLUCOSE 173* 195* 93 237*     Imaging/Diagnostic Tests: Vas [place] Lower Extremity Venous (dvt)  Result Date: 01/08/2018  Lower Venous Study Indications: Swelling.  Performing Technologist: Landry Mellow RDMS, RVT  Examination Guidelines: A complete evaluation includes B-mode imaging, spectral Doppler, color Doppler, and power Doppler as needed of all accessible portions of each vessel. Bilateral testing is  considered an integral part of a complete examination. Limited examinations for reoccurring indications may be performed as noted.  Right Venous Findings: +---------+---------------+---------+-----------+----------+-------+          CompressibilityPhasicitySpontaneityPropertiesSummary +---------+---------------+---------+-----------+----------+-------+ CFV      Full           Yes      Yes                          +---------+---------------+---------+-----------+----------+-------+ SFJ      Full                                                 +---------+---------------+---------+-----------+----------+-------+ FV Prox  Full                                                 +---------+---------------+---------+-----------+----------+-------+ FV Mid   Full                                                 +---------+---------------+---------+-----------+----------+-------+  FV DistalFull                                                 +---------+---------------+---------+-----------+----------+-------+ PFV      Full                                                 +---------+---------------+---------+-----------+----------+-------+ POP      Full           Yes      Yes                          +---------+---------------+---------+-----------+----------+-------+ PTV      Full                                                 +---------+---------------+---------+-----------+----------+-------+ PERO     Full                                                 +---------+---------------+---------+-----------+----------+-------+  Left Venous Findings: +---+---------------+---------+-----------+----------+-------+    CompressibilityPhasicitySpontaneityPropertiesSummary +---+---------------+---------+-----------+----------+-------+ CFVFull           Yes      Yes                          +---+---------------+---------+-----------+----------+-------+     Summary: Right: There is no evidence of deep vein thrombosis in the lower extremity. No cystic structure found in the popliteal fossa. Left: No evidence of common femoral vein obstruction.  *See table(s) above for measurements and observations.    Preliminary      Kathrene Alu, MD 01/09/2018, 12:31 AM PGY-2, [place] Intern pager: [phone], text pages welcome

## 2018-01-09 NOTE — Discharge Summary (Signed)
[facility] Discharge Summary  Patient name: Sara Davis Medical record number: [id number] Date of birth: 1950/12/07 Age: 67 y.o. Gender: female Date of Admission: 01/06/2018  Date of Discharge: 01/09/2018 Admitting Physician: Leeanne Rio, MD  Primary Care Provider: [name], [name], DO Consultants: cardiology, gastroenterology  Indication for Hospitalization: anemia  Discharge Diagnoses/Problem List:  Anemia HFpEF Hypertension AKI CKD UTI DM-II HLD  Disposition: home  Discharge Condition: improved, stable.   Discharge Exam:  Temp:  [97.3 F (36.3 C)-97.7 F (36.5 C)] 97.7 F (36.5 C) (12/07 2026) Pulse Rate:  [65-70] 70 (12/07 2116) Resp:  [16-19] 18 (12/07 2116) BP: (172-192)/(62-73) 179/68 (12/07 2116) SpO2:  [97 %-100 %] 100 % (12/07 2116) Weight:  [65.1 kg] 65.1 kg (12/07 3546)   Physical Exam: General: NAD, pleasant, appears comfortable sitting on edge of the bed Cardiac: RRR, no MRG Respiratory: CTAB, normal work of breathing, no wheezing Abdomen: Soft, nontender, nondistended Extremities: Moves all extremities spontaneously, no pedal edema Psych: Normal affect and mood  Brief Hospital Course:  Sara Davis is a 67 y.o. female who presented to the ED with symptomatic anemia. PMH is significant for CHF, anemia, T2DM, HTN and tobacco use. She was found to have a hemoglobin of 4.5. She was transfused 3 u pRBC and her hemoglobin improved to 9.7.  By the time of discharge her Hgb had stabilized at 8.6.  Endoscopy was performed and found nonbleeding  gastric ulcers and mild esophagitis. colonscopy showed 3 4-8 mm polyps in the transverse colon (pathology pending). Biopsy was performed on EGD and was positive for H. Pylori.  Patient was put on pantoprazole and iron.    Echo was ordered due to patient's SOB and swelling.  Echo showed EF 55-60% with G2DD, moderate LVH and without wall motion abnormalities, however PA pressure of  75 mm Hg consistent with moderate to severe pulmonary HTN. Patient was started on furosemide 40mg  PO daily.   Initial BP on arrival in the ED was > 200.  HTN was controlled with amlodipine and carvedilol.     Patient was found to have UTI with > 100k pansensitive e.coli.  Patient was discharged with keflex 250mg  BID through 12/12.      Issues for Follow Up:  1. Please obtain a CBC and a BMP for this patient. 2. Gastric biopsy was positive for H. Pylori.  triple therapy was not initiated during admission, patient would need it started on followup visit.   3. Consider long actin insulin in place of 70/30 regiment.  Also consider GLP1/SGLT2 given cardiovascular disease.  Can refer to [facility] pharmacist to help with medication cost.  4. Please ensure that she is continuing to take her Keflex for her UTI. 5. Please go over her new blood pressure medications (amlodipine, carvedilol) with her and adjust as necessary depending on blood pressure in clinic. 6. HLD - consider increasing to 40mg  atorvastatin due to multiprisk factors of HTN, DM2, smoking status.   7. Cardiology wants repeat echo in 3 months.  8. CXR was notable for possible emphysema.  Patient does not have COPD diagnosis.  Consider PFT outpatient.   Significant Procedures: EGD/colonoscopy, TTE,   Significant Labs and Imaging:  Recent Labs  Lab 01/07/18 1541 01/08/18 0702 01/09/18 0513  WBC 9.6 9.5 9.4  HGB 9.7* 9.1* 8.6*  HCT 31.9* 29.9* 28.3*  PLT 392 374 329   Recent Labs  Lab 01/05/18 1206 01/06/18 0427 01/07/18 0639 01/08/18 0702 01/09/18 0513  NA 142  142 142 144 142  K 4.4 3.7 3.3* 3.4* 3.9  CL 111* 111 115* 114* 112*  CO2 18* 21* 16* 22 20*  GLUCOSE 173* 195* 93 237* 228*  BUN 23 26* 19 22 18   CREATININE 2.26* 2.36* 2.03* 2.50* 2.18*  CALCIUM 9.6 9.3 9.3 8.8* 9.0  ALKPHOS 87  --   --   --   --   AST 10  --   --   --   --   ALT 9  --   --   --   --   ALBUMIN 3.6  --   --   --   --      Results/Tests Pending  at Time of Discharge: surgical pathology  Discharge Medications:  Allergies as of 01/09/2018   No Known Allergies     Medication List    STOP taking these medications   aspirin 81 MG tablet   atorvastatin 20 MG tablet Commonly known as:  LIPITOR   carvedilol 12.5 MG tablet Commonly known as:  COREG   furosemide 20 MG tablet Commonly known as:  LASIX     TAKE these medications   acetaminophen 500 MG tablet Commonly known as:  TYLENOL Take 1,000 mg by mouth every 6 (six) hours as needed for mild pain or fever.   cephALEXin 250 MG capsule Commonly known as:  KEFLEX Take 1 capsule (250 mg total) by mouth every 12 (twelve) hours for 4 days.   CONTOUR BLOOD GLUCOSE SYSTEM Devi Use to test blood sugars once in the morning before food and once in the evening.DX: 250.02   ferrous sulfate 325 (65 FE) MG tablet Take 1 tablet (325 mg total) by mouth 2 (two) times daily with a meal. What changed:  when to take this   glucose blood test strip Please check blood sugar once daily in AM before eating and once daily in PM   insulin NPH-regular Human (70-30) 100 UNIT/ML injection Inject 14 Units into the skin 3 (three) times daily with meals. What changed:    how much to take  when to take this   Insulin Pen Needle 31G X 8 MM Misc CHECK BLOOD SUGARS ONCE IN MORNING BEFORE FOOD AND ONCE IN THE EVENING.   metFORMIN 1000 MG tablet Commonly known as:  GLUCOPHAGE Take 1 tablet (1,000 mg total) by mouth 2 (two) times daily with a meal.   onetouch ultrasoft lancets Please check blood sugar once daily in AM before eating and once daily in PM       Discharge Instructions: Please refer to Patient Instructions section of EMR for full details.  Patient was counseled important signs and symptoms that should prompt return to medical care, changes in medications, dietary instructions, activity restrictions, and follow up appointments.   Follow-Up Appointments: Follow-up Information     Kinnie Feil, MD. Go on 01/12/2018.   Specialty:  Family Medicine Why:  Please go to your appointment at 11:00AM.  Please bring all of your medications. Contact information: [place] [address] [phone]           Benay Pike, MD 01/11/2018, 5:54 PM PGY-1, [place]

## 2018-01-09 NOTE — Progress Notes (Signed)
Patient was discharge, family at bedside and transported in wheelchair. Peripheral IVs removed, clean, dry and intact, pressure and dressing applied. Patient denies chest pain. Living with HF packet given and educated with teach back as well as medication. Patients concerns and questions were answered. CCMD called.

## 2018-01-10 ENCOUNTER — Encounter (HOSPITAL_COMMUNITY): Payer: Self-pay | Admitting: Internal Medicine

## 2018-01-10 LAB — CLOTEST (H. PYLORI), BIOPSY: Helicobacter screen: POSITIVE — AB

## 2018-01-10 NOTE — Anesthesia Postprocedure Evaluation (Signed)
Anesthesia Post Note  Patient: Sara Davis  Procedure(s) Performed: COLONOSCOPY WITH PROPOFOL (N/A ) ESOPHAGOGASTRODUODENOSCOPY (EGD) WITH PROPOFOL (N/A ) POLYPECTOMY BIOPSY     Patient location during evaluation: PACU Anesthesia Type: MAC Level of consciousness: awake and alert Pain management: pain level controlled Vital Signs Assessment: post-procedure vital signs reviewed and stable Respiratory status: spontaneous breathing, nonlabored ventilation, respiratory function stable and patient connected to nasal cannula oxygen Cardiovascular status: stable and blood pressure returned to baseline Postop Assessment: no apparent nausea or vomiting Anesthetic complications: no    Last Vitals:  Vitals:   01/09/18 0824 01/09/18 1202  BP: (!) 153/69 (!) 165/73  Pulse: 66 63  Resp: 18 20  Temp:  36.8 C  SpO2: 100% 99%    Last Pain:  Vitals:   01/09/18 1202  TempSrc: Oral  PainSc:                  [place]

## 2018-01-12 ENCOUNTER — Inpatient Hospital Stay: Payer: Medicare HMO | Admitting: Family Medicine

## 2018-01-13 ENCOUNTER — Other Ambulatory Visit: Payer: Self-pay | Admitting: Family Medicine

## 2018-01-13 MED ORDER — METRONIDAZOLE 500 MG PO TABS: 500.0000 mg | ORAL_TABLET | Freq: Three times a day (TID) | ORAL | 0 refills | Status: AC

## 2018-01-13 MED ORDER — PANTOPRAZOLE SODIUM 40 MG PO TBEC: 40.0000 mg | DELAYED_RELEASE_TABLET | Freq: Two times a day (BID) | ORAL | 0 refills | Status: DC

## 2018-01-13 MED ORDER — CLARITHROMYCIN 500 MG PO TABS: 500.0000 mg | ORAL_TABLET | Freq: Two times a day (BID) | ORAL | 0 refills | Status: AC

## 2018-01-13 NOTE — Progress Notes (Signed)
Called and spoke with patient over the phone about her positive H. Pylori. Will treat with standard triple therapy: Protonix BID Clarithromycin 500mg  BID Metronidazole 500mg  TID  Patient to complete 14 day course. She has follow up tomorrow in the office as well. Voiced understanding of plan and all questions answered over the phone.   [name] Aubriana Ravelo, DO PGY-2, [facility]

## 2018-01-14 ENCOUNTER — Inpatient Hospital Stay: Payer: Medicare HMO | Admitting: Family Medicine

## 2018-02-22 DIAGNOSIS — E113513 Type 2 diabetes mellitus with proliferative diabetic retinopathy with macular edema, bilateral: Secondary | ICD-10-CM | POA: Diagnosis not present

## 2018-02-22 DIAGNOSIS — E113511 Type 2 diabetes mellitus with proliferative diabetic retinopathy with macular edema, right eye: Secondary | ICD-10-CM | POA: Diagnosis not present

## 2018-02-22 DIAGNOSIS — E113512 Type 2 diabetes mellitus with proliferative diabetic retinopathy with macular edema, left eye: Secondary | ICD-10-CM | POA: Diagnosis not present

## 2018-02-23 ENCOUNTER — Encounter: Payer: Self-pay | Admitting: Family Medicine

## 2018-02-23 ENCOUNTER — Ambulatory Visit (INDEPENDENT_AMBULATORY_CARE_PROVIDER_SITE_OTHER): Payer: Medicare HMO | Admitting: Family Medicine

## 2018-02-23 ENCOUNTER — Other Ambulatory Visit: Payer: Self-pay

## 2018-02-23 VITALS — BP 138/75 | HR 75 | Temp 97.8°F | Wt 142.0 lb

## 2018-02-23 DIAGNOSIS — G4709 Other insomnia: Secondary | ICD-10-CM | POA: Diagnosis not present

## 2018-02-23 DIAGNOSIS — Z720 Tobacco use: Secondary | ICD-10-CM | POA: Diagnosis not present

## 2018-02-23 DIAGNOSIS — D649 Anemia, unspecified: Secondary | ICD-10-CM

## 2018-02-23 DIAGNOSIS — E2839 Other primary ovarian failure: Secondary | ICD-10-CM | POA: Diagnosis not present

## 2018-02-23 DIAGNOSIS — R600 Localized edema: Secondary | ICD-10-CM

## 2018-02-23 DIAGNOSIS — E1165 Type 2 diabetes mellitus with hyperglycemia: Secondary | ICD-10-CM | POA: Diagnosis not present

## 2018-02-23 DIAGNOSIS — J438 Other emphysema: Secondary | ICD-10-CM

## 2018-02-23 DIAGNOSIS — I10 Essential (primary) hypertension: Secondary | ICD-10-CM

## 2018-02-23 DIAGNOSIS — K253 Acute gastric ulcer without hemorrhage or perforation: Secondary | ICD-10-CM

## 2018-02-23 MED ORDER — CARVEDILOL 25 MG PO TABS: 25.0000 mg | ORAL_TABLET | Freq: Two times a day (BID) | ORAL | 3 refills | Status: DC

## 2018-02-23 MED ORDER — METFORMIN HCL 1000 MG PO TABS: 1000.0000 mg | ORAL_TABLET | Freq: Two times a day (BID) | ORAL | 2 refills | Status: DC

## 2018-02-23 MED ORDER — AMLODIPINE BESYLATE 5 MG PO TABS: 5.0000 mg | ORAL_TABLET | Freq: Every day | ORAL | 3 refills | Status: DC

## 2018-02-23 MED ORDER — FERROUS SULFATE 325 (65 FE) MG PO TABS: 325.0000 mg | ORAL_TABLET | ORAL | 2 refills | Status: DC

## 2018-02-23 NOTE — Patient Instructions (Addendum)
Thank you for coming to see me today. It was a pleasure! Today we talked about:   You may use melatonin for sleep.  You can get this over-the-counter.  We will call you with your lab results.  I have ordered a mammogram and a bone density scan for you.  Please schedule and have these done.  Schedule an appointment with Dr. Valentina Lucks for pulmonary function testing (PFT's) to see how well your lungs look.   Please follow-up with me in 2 months or as needed.  If you have any questions or concerns, please do not hesitate to call the office at [phone].  Take Care,   [name] Xandrea Clarey, DO

## 2018-02-23 NOTE — Progress Notes (Signed)
Subjective:    Patient ID: Sara Davis, female    DOB: Sep 23, 1950, 68 y.o.   MRN: [id number]   CC: Hospital follow-up  HPI:  Hospital follow-up for anemia Patient admitted on 01/06/2018 requiring 3 units packed red blood cells with hemoglobin at discharge of 9.7.  Patient was to follow-up in clinic soon after her discharge from hospital but was unable to follow-up.  EGD at that time found nonbleeding gastric ulcers and mild esophagitis.  Patient was positive for H. pylori and has completed treatment thus far.  Patient also continues pantoprazole as well as iron supplementation. Patient states that overall she is feeling exponentially better since her discharge from the hospital.  States that she has not had any dark stools or episodes of emesis with blood.  Patient was also treated for UTI during her hospitalization but reports she is not having any continued urinary symptoms such as dysuria.  Insomnia Patient states that she is still having trouble sleeping at night and has not started the melatonin because she could not remember the name of the medication.  Leg swelling Patient to have repeat echo in 3 months.  Where she has having improvement in her leg swelling.  Reports compliance with furosemide 40 mg daily.  Echo: showed EF 55-60% with G2DD, moderate LVH and without wall motion abnormalities, however PA pressure of 75 mm Hg consistent with moderate to severe pulmonary HTN.  COPD CXR notable for emphysema during hospitalization and patient does not have COPD diagnosis.  Was told in hospital that she will need PFTs as an outpatient.  Patient denies any trouble with breathing.  States that she has been trying to stop smoking.  She is down to 1-1/2 PPD  Health maintenance Patient due for mammogram and DEXA scan.  Smoking status reviewed  ROS: 10 point ROS is otherwise negative, except as mentioned in HPI  Patient Active Problem List   Diagnosis Date Noted  . Emphysema of lung  ([place]) 03/07/2018  . Acute gastric ulcer without hemorrhage or perforation   . Angiodysplasia of duodenum   . Benign neoplasm of transverse colon   . Benign neoplasm of sigmoid colon   . CHF exacerbation ([place]) 01/06/2018  . Bilateral leg edema 01/06/2018  . Pancreatic cyst 12/03/2016  . Anemia 12/03/2016  . AKI (acute kidney injury) ([place])   . Protein-calorie malnutrition, severe 11/25/2016  . Chest pain   . Hypertensive urgency   . Onychomycosis 07/30/2015  . Essential hypertension   . Headache 04/18/2014  . Insomnia 07/31/2013  . Uncontrolled type 2 diabetes mellitus ([place]) 01/14/2012  . Tobacco abuse 01/14/2012     Objective:  BP 138/75   Pulse 75   Temp 97.8 F (36.6 C) (Oral)   Wt 142 lb (64.4 kg)   SpO2 98%   BMI 22.92 kg/m  Vitals and nursing note reviewed  General: NAD, pleasant Cardiac: RRR, normal heart sounds, no murmurs Respiratory: CTAB, normal effort Extremities: no edema or cyanosis. WWP. Skin: warm and dry, no rashes noted Neuro: alert and oriented, no focal deficits Psych: normal affect  Assessment & Plan:    Essential hypertension Refilled patient's Coreg and amlodipine.  Obtain BMP today.  Insomnia Patient reports that she has started having help taking care of her mother and has not tried melatonin as of yet.  Patient to trial melatonin to help with her insomnia.  Counseled on sleep hygiene.  Tobacco abuse Patient encouraged to stop smoking and offered assistance however patient reports that she  will not be able to stop until she is ready.  Uncontrolled type 2 diabetes mellitus ([place]) Refilled patient's metformin.  Bilateral leg edema Cardiology recommends repeat echo in 3 months.  Patient compliant with Lasix 40 mg daily.  Acute gastric ulcer without hemorrhage or perforation Patient reports she has completed treatment for H. pylori.  Will repeat CBC today.  Refill patient's iron tablets and instructed her to take every other day to decrease  her symptoms of constipation.  Emphysema of lung ([place]) Emphysema noted on CXR and patient with 20-pack-year smoking history.  Will obtain PFTs by Dr. Valentina Lucks, patient to schedule.  Health maintenance Ordered mammogram and DEXA scan today.  [name] Delayne Sanzo, DO Family Medicine Resident PGY-2

## 2018-02-24 LAB — BASIC METABOLIC PANEL
BUN/Creatinine Ratio: 10 — ABNORMAL LOW (ref 12–28)
BUN: 20 mg/dL (ref 8–27)
CO2: 23 mmol/L (ref 20–29)
Calcium: 9.6 mg/dL (ref 8.7–10.3)
Chloride: 103 mmol/L (ref 96–106)
Creatinine, Ser: 2.07 mg/dL — ABNORMAL HIGH (ref 0.57–1.00)
GFR calc Af Amer: 28 mL/min/{1.73_m2} — ABNORMAL LOW (ref >59)
GFR calc non Af Amer: 24 mL/min/{1.73_m2} — ABNORMAL LOW (ref >59)
Glucose: 332 mg/dL — ABNORMAL HIGH (ref 65–99)
Potassium: 3.8 mmol/L (ref 3.5–5.2)
Sodium: 138 mmol/L (ref 134–144)

## 2018-02-24 LAB — CBC
HEMATOCRIT: 32.3 % — AB (ref 34.0–46.6)
HEMOGLOBIN: 10.3 g/dL — AB (ref 11.1–15.9)
MCH: 26.3 pg — ABNORMAL LOW (ref 26.6–33.0)
MCHC: 31.9 g/dL (ref 31.5–35.7)
MCV: 82 fL (ref 79–97)
Platelets: 283 10*3/uL (ref 150–450)
RBC: 3.92 x10E6/uL (ref 3.77–5.28)
RDW: 15.7 % — ABNORMAL HIGH (ref 11.7–15.4)
WBC: 10.2 10*3/uL (ref 3.4–10.8)

## 2018-03-07 DIAGNOSIS — J439 Emphysema, unspecified: Secondary | ICD-10-CM | POA: Insufficient documentation

## 2018-03-07 NOTE — Assessment & Plan Note (Signed)
Patient encouraged to stop smoking and offered assistance however patient reports that she will not be able to stop until she is ready.

## 2018-03-07 NOTE — Assessment & Plan Note (Signed)
Cardiology recommends repeat echo in 3 months.  Patient compliant with Lasix 40 mg daily.

## 2018-03-07 NOTE — Assessment & Plan Note (Signed)
Refilled patient's metformin. 

## 2018-03-07 NOTE — Assessment & Plan Note (Signed)
Patient reports that she has started having help taking care of her mother and has not tried melatonin as of yet.  Patient to trial melatonin to help with her insomnia.  Counseled on sleep hygiene.

## 2018-03-07 NOTE — Assessment & Plan Note (Signed)
Refilled patient's Coreg and amlodipine.  Obtain BMP today.

## 2018-03-07 NOTE — Assessment & Plan Note (Signed)
Patient reports she has completed treatment for H. pylori.  Will repeat CBC today.  Refill patient's iron tablets and instructed her to take every other day to decrease her symptoms of constipation.

## 2018-03-07 NOTE — Assessment & Plan Note (Signed)
Emphysema noted on CXR and patient with 20-pack-year smoking history.  Will obtain PFTs by Dr. Valentina Lucks, patient to schedule.

## 2018-03-10 ENCOUNTER — Other Ambulatory Visit: Payer: Self-pay | Admitting: Family Medicine

## 2018-03-10 DIAGNOSIS — Z1231 Encounter for screening mammogram for malignant neoplasm of breast: Secondary | ICD-10-CM

## 2018-03-17 ENCOUNTER — Ambulatory Visit: Payer: Medicare HMO | Admitting: Pharmacist

## 2018-03-22 DIAGNOSIS — E113513 Type 2 diabetes mellitus with proliferative diabetic retinopathy with macular edema, bilateral: Secondary | ICD-10-CM | POA: Diagnosis not present

## 2018-03-25 DIAGNOSIS — E113513 Type 2 diabetes mellitus with proliferative diabetic retinopathy with macular edema, bilateral: Secondary | ICD-10-CM | POA: Diagnosis not present

## 2018-03-31 ENCOUNTER — Ambulatory Visit: Payer: Medicare HMO | Admitting: Family Medicine

## 2018-03-31 ENCOUNTER — Other Ambulatory Visit: Payer: Self-pay | Admitting: *Deleted

## 2018-04-01 MED ORDER — INSULIN NPH ISOPHANE & REGULAR (70-30) 100 UNIT/ML ~~LOC~~ SUSP: 22.0000 [IU] | Freq: Two times a day (BID) | SUBCUTANEOUS | 2 refills | Status: DC

## 2018-04-19 ENCOUNTER — Ambulatory Visit: Payer: Medicare HMO | Admitting: Family Medicine

## 2018-05-09 DIAGNOSIS — E113513 Type 2 diabetes mellitus with proliferative diabetic retinopathy with macular edema, bilateral: Secondary | ICD-10-CM | POA: Diagnosis not present

## 2018-06-28 DIAGNOSIS — E113513 Type 2 diabetes mellitus with proliferative diabetic retinopathy with macular edema, bilateral: Secondary | ICD-10-CM | POA: Diagnosis not present

## 2018-07-14 ENCOUNTER — Other Ambulatory Visit: Payer: Self-pay

## 2018-07-14 MED ORDER — FUROSEMIDE 40 MG PO TABS: 40.0000 mg | ORAL_TABLET | Freq: Every day | ORAL | 0 refills | Status: DC

## 2018-07-14 MED ORDER — AMLODIPINE BESYLATE 5 MG PO TABS: 5.0000 mg | ORAL_TABLET | Freq: Every day | ORAL | 3 refills | Status: DC

## 2018-07-14 MED ORDER — ATORVASTATIN CALCIUM 40 MG PO TABS: 40.0000 mg | ORAL_TABLET | Freq: Every day | ORAL | 3 refills | Status: DC

## 2018-07-14 MED ORDER — ACCU-CHEK AVIVA DEVI: 0 refills | Status: AC

## 2018-07-14 MED ORDER — METFORMIN HCL 1000 MG PO TABS: 1000.0000 mg | ORAL_TABLET | Freq: Two times a day (BID) | ORAL | 2 refills | Status: DC

## 2018-07-14 MED ORDER — CARVEDILOL 25 MG PO TABS: 25.0000 mg | ORAL_TABLET | Freq: Two times a day (BID) | ORAL | 3 refills | Status: DC

## 2018-07-14 MED ORDER — BD SWAB SINGLE USE REGULAR PADS: 1.0000 [IU] | MEDICATED_PAD | Freq: Three times a day (TID) | 2 refills | Status: DC | PRN

## 2018-07-14 MED ORDER — ACCU-CHEK SOFT TOUCH LANCETS MISC: 12 refills | Status: DC

## 2018-07-14 MED ORDER — ACCU-CHEK AVIVA VI SOLN: 1.0000 [IU] | Freq: Three times a day (TID) | 0 refills | Status: DC | PRN

## 2018-07-14 MED ORDER — BD PEN NEEDLE SHORT U/F 31G X 8 MM MISC: 0 refills | Status: DC

## 2018-07-14 MED ORDER — PANTOPRAZOLE SODIUM 40 MG PO TBEC: 40.0000 mg | DELAYED_RELEASE_TABLET | Freq: Two times a day (BID) | ORAL | 0 refills | Status: DC

## 2018-07-14 MED ORDER — ACCU-CHEK AVIVA PLUS VI STRP: ORAL_STRIP | 12 refills | Status: DC

## 2018-08-08 DIAGNOSIS — E113513 Type 2 diabetes mellitus with proliferative diabetic retinopathy with macular edema, bilateral: Secondary | ICD-10-CM | POA: Diagnosis not present

## 2018-09-19 DIAGNOSIS — E113513 Type 2 diabetes mellitus with proliferative diabetic retinopathy with macular edema, bilateral: Secondary | ICD-10-CM | POA: Diagnosis not present

## 2018-10-11 DIAGNOSIS — E113513 Type 2 diabetes mellitus with proliferative diabetic retinopathy with macular edema, bilateral: Secondary | ICD-10-CM | POA: Diagnosis not present

## 2018-12-12 ENCOUNTER — Other Ambulatory Visit: Payer: Self-pay

## 2018-12-12 ENCOUNTER — Other Ambulatory Visit: Payer: Self-pay | Admitting: Family Medicine

## 2018-12-12 ENCOUNTER — Ambulatory Visit (INDEPENDENT_AMBULATORY_CARE_PROVIDER_SITE_OTHER): Payer: Medicare HMO | Admitting: Family Medicine

## 2018-12-12 ENCOUNTER — Encounter: Payer: Self-pay | Admitting: Family Medicine

## 2018-12-12 VITALS — BP 134/58 | HR 76 | Wt 149.0 lb

## 2018-12-12 DIAGNOSIS — R0609 Other forms of dyspnea: Secondary | ICD-10-CM

## 2018-12-12 DIAGNOSIS — Z720 Tobacco use: Secondary | ICD-10-CM

## 2018-12-12 DIAGNOSIS — I10 Essential (primary) hypertension: Secondary | ICD-10-CM | POA: Diagnosis not present

## 2018-12-12 DIAGNOSIS — R0989 Other specified symptoms and signs involving the circulatory and respiratory systems: Secondary | ICD-10-CM | POA: Diagnosis not present

## 2018-12-12 DIAGNOSIS — E2839 Other primary ovarian failure: Secondary | ICD-10-CM

## 2018-12-12 DIAGNOSIS — R0602 Shortness of breath: Secondary | ICD-10-CM

## 2018-12-12 DIAGNOSIS — Z1231 Encounter for screening mammogram for malignant neoplasm of breast: Secondary | ICD-10-CM

## 2018-12-12 DIAGNOSIS — M545 Low back pain, unspecified: Secondary | ICD-10-CM

## 2018-12-12 DIAGNOSIS — R0600 Dyspnea, unspecified: Secondary | ICD-10-CM

## 2018-12-12 DIAGNOSIS — E1165 Type 2 diabetes mellitus with hyperglycemia: Secondary | ICD-10-CM

## 2018-12-12 LAB — POCT GLYCOSYLATED HEMOGLOBIN (HGB A1C): HbA1c, POC (controlled diabetic range): 7.8 % — AB (ref 0.0–7.0)

## 2018-12-12 NOTE — Patient Instructions (Addendum)
Thank you for coming to see me today. It was a pleasure! Today we talked about:   We will have the pharmacy team reach out to you about helping to quit smoke.  I have placed a referral for a pulmonologist Dr.  Deborha Payment will reform the PFTs (pulmonary function testing) that we discussed in the office.  This is to evaluate to see if you would benefit from an inhaler.  I will call you with your lab results within the next few days.  Please follow-up me in 3 months or sooner as needed.  If you have any questions or concerns, please do not hesitate to call the office at [phone].  Take Care,   [name] Daysi Boggan, DO

## 2018-12-12 NOTE — Progress Notes (Signed)
Subjective:  Patient ID: Sara Davis  DOB: 12-21-1950 MRN: [id number]  Sara Davis is a 68 y.o. female with a PMH of HTN, h/o anemia, emphysema, HFpEF, T2DM, h/o gastric ulcer 2/2 H/ pylori, here today for f/u diabetes and fatigue.   HPI:  Diabetes: -Did not get to discuss diabetes with patient's as she had too many acute complaints  - On statin - Last eye exam: recently  - Last foot exam: done today ROS: denies hypoglycemic sx, dizziness, diaphoresis, LOC, polyuria, polydipsia  Monitoring Labs and Parameters - Last A1C:  Lab Results  Component Value Date   HGBA1C 7.8 (A) 12/12/2018    Hypertension: - Medications: Carvedilol 25 mg twice daily, Lasix 40 mg daily, Norvasc 5 mg - Compliance: Yes - Checking BP at home: No - Denies any CP, vision changes, LE edema, medication SEs, or symptoms of hypotension  SOB and Fatigue: - noticed it about 1-2 weeks ago. Feels SOB with exertion only.  States that she got short of breath walking from the car to the office. - No CP with it.  Patient is now down to smoking 1/2 ppd from 2 packs/day.  -She denies any lower extremity swelling - Some cough, occasionally, not productive. No fevers or chills. No sick contacts.   Leg pain/ Back Pain: Achy when she is walking. She has pain with walking. If she is sitting it is fine. Can get out bed and go to the kitchen and when she gets back to her bed, she is achy. Achy coming In from the parking lot. For about 2 weeks.   Tobacco Use Disorder: Patient is now smoking 1/2 pack/day was previously smoking 2 packs/day.  Would like [place] to help her by having pharmacy reach out to call her.  She has done this on her own and has not required any medications and would like to continue trying it without medications.  ROS: as mentioned in HPI  Social hx: Denies use of illicit drugs, alcohol use Smoking status reviewed  Patient Active Problem List   Diagnosis Date Noted  . Dyspnea on exertion 12/13/2018   . Low back pain 12/13/2018  . Emphysema of lung ([place]) 03/07/2018  . Acute gastric ulcer without hemorrhage or perforation   . Angiodysplasia of duodenum   . Benign neoplasm of transverse colon   . Benign neoplasm of sigmoid colon   . CHF exacerbation ([place]) 01/06/2018  . Bilateral leg edema 01/06/2018  . Pancreatic cyst 12/03/2016  . Anemia 12/03/2016  . Protein-calorie malnutrition, severe 11/25/2016  . Chest pain   . Onychomycosis 07/30/2015  . Essential hypertension   . Headache 04/18/2014  . Insomnia 07/31/2013  . Uncontrolled type 2 diabetes mellitus ([place]) 01/14/2012  . Tobacco abuse 01/14/2012     Objective:  BP (!) 134/58   Pulse 76   Wt 149 lb (67.6 kg)   SpO2 98%   BMI 24.05 kg/m   Vitals and nursing note reviewed Pulse ox with ambulation 95% on room air  General: NAD, pleasant Cardiac: RRR, normal heart sounds, no m/r/g Pulm: normal effort, CTAB Extremities: no edema or cyanosis. WWP.  Strength 5/5 in BL LE.  Sensation grossly intact Skin: warm and dry, no rashes noted Neuro: alert and oriented, no focal deficits Psych: normal affect, normal thought content  Diabetic Foot Exam - Simple   Simple Foot Form Visual Inspection See comments: Yes Sensation Testing Intact to touch and monofilament testing bilaterally: Yes Pulse Check See comments: Yes Comments Onychomycosis noted  on right foot with small ulceration noted on third toe on dorsal aspect.  No drainage or surrounding erythema.  Ulceration stage II and appears well-healing. Posterior tibialis and dorsalis pulse intact on left extremity.  Unable to palpate pulses on right extremity.  ABIs performed in office WNL     Assessment & Plan:   Essential hypertension BP a little low in office at 134/58, patient was to continue monitoring however found to have hemoglobin of 5.1 which could be contributing  Uncontrolled type 2 diabetes mellitus ([place]) Will discuss at next visit as we did not have time.  Did  perform diabetic foot exam today.  Patient would like referral to podiatrist for ulcer on third toe.  Ulceration is small and appears to be well-healing.  Tobacco abuse Patient is now down to 1/2 pack/day and would like assistance from the pharmacy team to encourage her to continue smoking cessation.  Dyspnea on exertion Patient with dyspnea on exertion today in the office.  Will obtain CBC to rule out anemia as cause given that patient has previously had symptoms with anemia.  Patient with no signs of heart failure on exam.  She does endorse cough occasionally and never had formal PFTs in order to evaluate her previously seen emphysema on chest x-ray.  Patient continues to smoke but has decreased smoking from 2 packs/day to half pack per day.  Pulse ox with ambulation in office from at 97%>96% on room air.  12/13/2018: CBC returned and patient has hemoglobin of 5.1.  She was instructed to go to the emergency room.  This is likely the cause of patient's DOE, and will work up further in hospital  Low back pain Patient reporting pain in both of her legs and her lower back only when she is walking for ~2weeks. ABIs in office WNL. She denies any trauma.  No red flags on exam.  No numbness or tingling associated.  She has not tried any medication for the pain.  Instructed her to try heating pads and stretches and will also give her a muscle relaxer to trial.     [name] Jahmal Dunavant, DO Family Medicine Resident PGY-3

## 2018-12-13 ENCOUNTER — Telehealth: Payer: Self-pay | Admitting: Family Medicine

## 2018-12-13 ENCOUNTER — Other Ambulatory Visit: Payer: Self-pay

## 2018-12-13 ENCOUNTER — Inpatient Hospital Stay (HOSPITAL_COMMUNITY): Payer: Medicare HMO

## 2018-12-13 ENCOUNTER — Inpatient Hospital Stay (HOSPITAL_COMMUNITY)
Admission: EM | Admit: 2018-12-13 | Discharge: 2018-12-16 | DRG: 377 | Disposition: A | Discharge status: Home / Self Care | Payer: Medicare HMO | Attending: Family Medicine | Admitting: Family Medicine

## 2018-12-13 ENCOUNTER — Emergency Department (HOSPITAL_COMMUNITY): Payer: Medicare HMO

## 2018-12-13 ENCOUNTER — Encounter (HOSPITAL_COMMUNITY): Payer: Self-pay

## 2018-12-13 DIAGNOSIS — E1122 Type 2 diabetes mellitus with diabetic chronic kidney disease: Secondary | ICD-10-CM | POA: Diagnosis present

## 2018-12-13 DIAGNOSIS — E785 Hyperlipidemia, unspecified: Secondary | ICD-10-CM | POA: Diagnosis present

## 2018-12-13 DIAGNOSIS — K449 Diaphragmatic hernia without obstruction or gangrene: Secondary | ICD-10-CM | POA: Diagnosis present

## 2018-12-13 DIAGNOSIS — Z720 Tobacco use: Secondary | ICD-10-CM | POA: Diagnosis not present

## 2018-12-13 DIAGNOSIS — Z8249 Family history of ischemic heart disease and other diseases of the circulatory system: Secondary | ICD-10-CM | POA: Diagnosis not present

## 2018-12-13 DIAGNOSIS — Z681 Body mass index (BMI) 19 or less, adult: Secondary | ICD-10-CM | POA: Diagnosis not present

## 2018-12-13 DIAGNOSIS — N183 Chronic kidney disease, stage 3 unspecified: Secondary | ICD-10-CM | POA: Diagnosis not present

## 2018-12-13 DIAGNOSIS — F17210 Nicotine dependence, cigarettes, uncomplicated: Secondary | ICD-10-CM | POA: Diagnosis present

## 2018-12-13 DIAGNOSIS — R195 Other fecal abnormalities: Secondary | ICD-10-CM | POA: Diagnosis not present

## 2018-12-13 DIAGNOSIS — R0609 Other forms of dyspnea: Secondary | ICD-10-CM | POA: Insufficient documentation

## 2018-12-13 DIAGNOSIS — N179 Acute kidney failure, unspecified: Secondary | ICD-10-CM | POA: Diagnosis present

## 2018-12-13 DIAGNOSIS — Z794 Long term (current) use of insulin: Secondary | ICD-10-CM | POA: Diagnosis not present

## 2018-12-13 DIAGNOSIS — K209 Esophagitis, unspecified without bleeding: Secondary | ICD-10-CM | POA: Diagnosis not present

## 2018-12-13 DIAGNOSIS — N184 Chronic kidney disease, stage 4 (severe): Secondary | ICD-10-CM | POA: Diagnosis not present

## 2018-12-13 DIAGNOSIS — D509 Iron deficiency anemia, unspecified: Secondary | ICD-10-CM | POA: Diagnosis present

## 2018-12-13 DIAGNOSIS — M545 Low back pain, unspecified: Secondary | ICD-10-CM | POA: Insufficient documentation

## 2018-12-13 DIAGNOSIS — K31811 Angiodysplasia of stomach and duodenum with bleeding: Principal | ICD-10-CM | POA: Diagnosis present

## 2018-12-13 DIAGNOSIS — R0602 Shortness of breath: Secondary | ICD-10-CM | POA: Diagnosis not present

## 2018-12-13 DIAGNOSIS — E876 Hypokalemia: Secondary | ICD-10-CM | POA: Diagnosis not present

## 2018-12-13 DIAGNOSIS — I130 Hypertensive heart and chronic kidney disease with heart failure and stage 1 through stage 4 chronic kidney disease, or unspecified chronic kidney disease: Secondary | ICD-10-CM | POA: Diagnosis not present

## 2018-12-13 DIAGNOSIS — R64 Cachexia: Secondary | ICD-10-CM | POA: Diagnosis not present

## 2018-12-13 DIAGNOSIS — Z20828 Contact with and (suspected) exposure to other viral communicable diseases: Secondary | ICD-10-CM | POA: Diagnosis present

## 2018-12-13 DIAGNOSIS — K31819 Angiodysplasia of stomach and duodenum without bleeding: Secondary | ICD-10-CM | POA: Diagnosis not present

## 2018-12-13 DIAGNOSIS — D649 Anemia, unspecified: Secondary | ICD-10-CM

## 2018-12-13 DIAGNOSIS — J439 Emphysema, unspecified: Secondary | ICD-10-CM | POA: Diagnosis present

## 2018-12-13 DIAGNOSIS — R531 Weakness: Secondary | ICD-10-CM | POA: Diagnosis not present

## 2018-12-13 DIAGNOSIS — D500 Iron deficiency anemia secondary to blood loss (chronic): Secondary | ICD-10-CM | POA: Diagnosis not present

## 2018-12-13 DIAGNOSIS — I272 Pulmonary hypertension, unspecified: Secondary | ICD-10-CM

## 2018-12-13 DIAGNOSIS — E119 Type 2 diabetes mellitus without complications: Secondary | ICD-10-CM | POA: Diagnosis not present

## 2018-12-13 DIAGNOSIS — Z809 Family history of malignant neoplasm, unspecified: Secondary | ICD-10-CM

## 2018-12-13 DIAGNOSIS — I5032 Chronic diastolic (congestive) heart failure: Secondary | ICD-10-CM | POA: Diagnosis not present

## 2018-12-13 DIAGNOSIS — I10 Essential (primary) hypertension: Secondary | ICD-10-CM | POA: Diagnosis not present

## 2018-12-13 DIAGNOSIS — D631 Anemia in chronic kidney disease: Secondary | ICD-10-CM | POA: Diagnosis present

## 2018-12-13 DIAGNOSIS — Z833 Family history of diabetes mellitus: Secondary | ICD-10-CM | POA: Diagnosis not present

## 2018-12-13 DIAGNOSIS — E43 Unspecified severe protein-calorie malnutrition: Secondary | ICD-10-CM | POA: Diagnosis not present

## 2018-12-13 LAB — BASIC METABOLIC PANEL
Anion gap: 8 (ref 5–15)
BUN/Creatinine Ratio: 9 — ABNORMAL LOW (ref 12–28)
BUN: 26 mg/dL (ref 8–27)
BUN: 23 mg/dL (ref 8–23)
CO2: 15 mmol/L — ABNORMAL LOW (ref 20–29)
CO2: 16 mmol/L — ABNORMAL LOW (ref 22–32)
Calcium: 9.6 mg/dL (ref 8.7–10.3)
Calcium: 9.3 mg/dL (ref 8.9–10.3)
Chloride: 113 mmol/L — ABNORMAL HIGH (ref 96–106)
Chloride: 115 mmol/L — ABNORMAL HIGH (ref 98–111)
Creatinine, Ser: 3.02 mg/dL — ABNORMAL HIGH (ref 0.57–1.00)
Creatinine, Ser: 3.2 mg/dL — ABNORMAL HIGH (ref 0.44–1.00)
GFR calc Af Amer: 18 mL/min/{1.73_m2} — ABNORMAL LOW (ref >59)
GFR calc Af Amer: 17 mL/min — ABNORMAL LOW (ref >60)
GFR calc non Af Amer: 15 mL/min/{1.73_m2} — ABNORMAL LOW (ref >59)
GFR calc non Af Amer: 14 mL/min — ABNORMAL LOW (ref >60)
Glucose, Bld: 183 mg/dL — ABNORMAL HIGH (ref 70–99)
Glucose: 229 mg/dL — ABNORMAL HIGH (ref 65–99)
Potassium: 3.7 mmol/L (ref 3.5–5.2)
Potassium: 3.2 mmol/L — ABNORMAL LOW (ref 3.5–5.1)
Sodium: 142 mmol/L (ref 134–144)
Sodium: 139 mmol/L (ref 135–145)

## 2018-12-13 LAB — VITAMIN B12: Vitamin B-12: 266 pg/mL (ref 180–914)

## 2018-12-13 LAB — ECHOCARDIOGRAM COMPLETE
Height: 66 in
Weight: 1439.16 oz

## 2018-12-13 LAB — CBC
Hematocrit: 16.5 % — CL (ref 34.0–46.6)
Hemoglobin: 5.1 g/dL — CL (ref 11.1–15.9)
MCH: 26.6 pg (ref 26.6–33.0)
MCHC: 30.9 g/dL — ABNORMAL LOW (ref 31.5–35.7)
MCV: 86 fL (ref 79–97)
Platelets: 339 10*3/uL (ref 150–450)
RBC: 1.92 x10E6/uL — CL (ref 3.77–5.28)
RDW: 14.5 % (ref 11.7–15.4)
WBC: 10.7 10*3/uL (ref 3.4–10.8)

## 2018-12-13 LAB — CBC WITH DIFFERENTIAL/PLATELET
Abs Immature Granulocytes: 0.03 10*3/uL (ref 0.00–0.07)
Basophils Absolute: 0 10*3/uL (ref 0.0–0.1)
Basophils Relative: 0 %
Eosinophils Absolute: 0.2 10*3/uL (ref 0.0–0.5)
Eosinophils Relative: 2 %
HCT: 15.4 % — ABNORMAL LOW (ref 36.0–46.0)
Hemoglobin: 4.7 g/dL — CL (ref 12.0–15.0)
Immature Granulocytes: 0 %
Lymphocytes Relative: 25 %
Lymphs Abs: 2.1 10*3/uL (ref 0.7–4.0)
MCH: 27.8 pg (ref 26.0–34.0)
MCHC: 30.5 g/dL (ref 30.0–36.0)
MCV: 91.1 fL (ref 80.0–100.0)
Monocytes Absolute: 0.5 10*3/uL (ref 0.1–1.0)
Monocytes Relative: 6 %
Neutro Abs: 5.6 10*3/uL (ref 1.7–7.7)
Neutrophils Relative %: 67 %
Platelets: 313 10*3/uL (ref 150–400)
RBC: 1.69 MIL/uL — ABNORMAL LOW (ref 3.87–5.11)
RDW: 14.8 % (ref 11.5–15.5)
WBC: 8.5 10*3/uL (ref 4.0–10.5)
nRBC: 0 % (ref 0.0–0.2)

## 2018-12-13 LAB — SARS CORONAVIRUS 2 (TAT 6-24 HRS): SARS Coronavirus 2: NEGATIVE

## 2018-12-13 LAB — RETICULOCYTES
Immature Retic Fract: 29.6 % — ABNORMAL HIGH (ref 2.3–15.9)
RBC.: 1.97 MIL/uL — ABNORMAL LOW (ref 3.87–5.11)
Retic Count, Absolute: 76.8 10*3/uL (ref 19.0–186.0)
Retic Ct Pct: 3.9 % — ABNORMAL HIGH (ref 0.4–3.1)

## 2018-12-13 LAB — PREPARE RBC (CROSSMATCH)

## 2018-12-13 LAB — HEPATIC FUNCTION PANEL
ALT: 10 U/L (ref 0–44)
AST: 11 U/L — ABNORMAL LOW (ref 15–41)
Albumin: 3.2 g/dL — ABNORMAL LOW (ref 3.5–5.0)
Alkaline Phosphatase: 107 U/L (ref 38–126)
Bilirubin, Direct: <0.1 mg/dL (ref 0.0–0.2)
Total Bilirubin: 0.4 mg/dL (ref 0.3–1.2)
Total Protein: 5.5 g/dL — ABNORMAL LOW (ref 6.5–8.1)

## 2018-12-13 LAB — IRON AND TIBC
Iron: 24 ug/dL — ABNORMAL LOW (ref 28–170)
Saturation Ratios: 6 % — ABNORMAL LOW (ref 10.4–31.8)
TIBC: 395 ug/dL (ref 250–450)
UIBC: 371 ug/dL

## 2018-12-13 LAB — PROTIME-INR
INR: 1.1 (ref 0.8–1.2)
Prothrombin Time: 13.7 seconds (ref 11.4–15.2)

## 2018-12-13 LAB — HEMOGLOBIN AND HEMATOCRIT, BLOOD
HCT: 23.5 % — ABNORMAL LOW (ref 36.0–46.0)
HCT: 29.8 % — ABNORMAL LOW (ref 36.0–46.0)
Hemoglobin: 7.4 g/dL — ABNORMAL LOW (ref 12.0–15.0)
Hemoglobin: 9.7 g/dL — ABNORMAL LOW (ref 12.0–15.0)

## 2018-12-13 LAB — FOLATE: Folate: 12.9 ng/mL (ref >5.9)

## 2018-12-13 LAB — SAMPLE TO BLOOD BANK

## 2018-12-13 LAB — POC OCCULT BLOOD, ED: Fecal Occult Bld: POSITIVE — AB

## 2018-12-13 LAB — FERRITIN: Ferritin: 7 ng/mL — ABNORMAL LOW (ref 11–307)

## 2018-12-13 LAB — CBG MONITORING, ED: Glucose-Capillary: 147 mg/dL — ABNORMAL HIGH (ref 70–99)

## 2018-12-13 MED ORDER — DIPHENHYDRAMINE HCL 25 MG PO CAPS
25.0000 mg | ORAL_CAPSULE | Freq: Once | ORAL | Status: AC
  Administered 2018-12-13: 12:00:00 25 mg via ORAL
  Filled 2018-12-13: qty 1

## 2018-12-13 MED ORDER — ATORVASTATIN CALCIUM 80 MG PO TABS
80.0000 mg | ORAL_TABLET | Freq: Every day | ORAL | Status: DC
  Administered 2018-12-13 – 2018-12-15 (×3): 80 mg via ORAL
  Filled 2018-12-13 (×3): qty 1

## 2018-12-13 MED ORDER — SODIUM CHLORIDE 0.9% IV SOLUTION
Freq: Once | INTRAVENOUS | Status: AC
  Administered 2018-12-13: 10:00:00 via INTRAVENOUS

## 2018-12-13 MED ORDER — ACETAMINOPHEN 650 MG RE SUPP: 650.0000 mg | Freq: Four times a day (QID) | RECTAL | Status: DC | PRN

## 2018-12-13 MED ORDER — CARVEDILOL 25 MG PO TABS
25.0000 mg | ORAL_TABLET | Freq: Two times a day (BID) | ORAL | Status: DC
  Administered 2018-12-13 – 2018-12-16 (×7): 25 mg via ORAL
  Filled 2018-12-13: qty 2
  Filled 2018-12-13 (×6): qty 1

## 2018-12-13 MED ORDER — AMLODIPINE BESYLATE 5 MG PO TABS: 5.0000 mg | ORAL_TABLET | Freq: Every day | ORAL | Status: DC

## 2018-12-13 MED ORDER — ACETAMINOPHEN 325 MG PO TABS: 650.0000 mg | ORAL_TABLET | Freq: Four times a day (QID) | ORAL | Status: DC | PRN

## 2018-12-13 MED ORDER — POLYETHYLENE GLYCOL 3350 17 G PO PACK: 17.0000 g | PACK | Freq: Every day | ORAL | Status: DC | PRN

## 2018-12-13 MED ORDER — AMLODIPINE BESYLATE 5 MG PO TABS
5.0000 mg | ORAL_TABLET | Freq: Every day | ORAL | Status: DC
  Administered 2018-12-13 – 2018-12-15 (×3): 5 mg via ORAL
  Filled 2018-12-13 (×3): qty 1

## 2018-12-13 MED ORDER — PANTOPRAZOLE SODIUM 40 MG IV SOLR
40.0000 mg | Freq: Two times a day (BID) | INTRAVENOUS | Status: DC
  Administered 2018-12-13 – 2018-12-14 (×3): 40 mg via INTRAVENOUS
  Filled 2018-12-13 (×3): qty 40

## 2018-12-13 NOTE — Assessment & Plan Note (Signed)
BP a little low in office at 134/58, patient was to continue monitoring however found to have hemoglobin of 5.1 which could be contributing

## 2018-12-13 NOTE — Assessment & Plan Note (Signed)
Patient is now down to 1/2 pack/day and would like assistance from the pharmacy team to encourage her to continue smoking cessation.

## 2018-12-13 NOTE — ED Notes (Signed)
Admitting Provider at bedside. 

## 2018-12-13 NOTE — ED Notes (Signed)
Blood transfusion orders verified with Lemmons, PA (GI), total of 3 units needed.

## 2018-12-13 NOTE — Assessment & Plan Note (Signed)
Will discuss at next visit as we did not have time.  Did perform diabetic foot exam today.  Patient would like referral to podiatrist for ulcer on third toe.  Ulceration is small and appears to be well-healing.

## 2018-12-13 NOTE — ED Notes (Signed)
Pt informed admitting md that she was itching.

## 2018-12-13 NOTE — Assessment & Plan Note (Signed)
Patient with dyspnea on exertion today in the office.  Will obtain CBC to rule out anemia as cause given that patient has previously had symptoms with anemia.  Patient with no signs of heart failure on exam.  She does endorse cough occasionally and never had formal PFTs in order to evaluate her previously seen emphysema on chest x-ray.  Patient continues to smoke but has decreased smoking from 2 packs/day to half pack per day.  Pulse ox with ambulation in office from at 97%>96% on room air.  12/13/2018: CBC returned and patient has hemoglobin of 5.1.  She was instructed to go to the emergency room.  This is likely the cause of patient's DOE, and will work up further in hospital

## 2018-12-13 NOTE — ED Triage Notes (Signed)
Pt presents to ED via POV for abnormal labs. PCP RN called this am stating her blood count was low. Pt reports feeling tired, legs ache

## 2018-12-13 NOTE — ED Notes (Signed)
ED TO INPATIENT HANDOFF REPORT  ED Nurse Name and Phone #: William Hamburger [place]  S Name/Age/Gender Sara Davis 68 y.o. female Room/Bed: 030C/030C  Code Status   Code Status: Full Code  Home/SNF/Other Home Patient oriented to: self, place, time and situation Is this baseline? No   Triage Complete: Triage complete  Chief Complaint sent from doctor  Triage Note Pt presents to ED via POV for abnormal labs. PCP RN called this am stating her blood count was low. Pt reports feeling tired, legs ache    Allergies Allergies  Allergen Reactions  . Aspirin Other (See Comments)    Upset stomach, can only take coated aspirin but prefers to not take it at all    Level of Care/Admitting Diagnosis ED Disposition    ED Disposition Condition [place]: [facility] [100100]  Level of Care: Telemetry Medical [104]  Covid Evaluation: Asymptomatic Screening Protocol (No Symptoms)  Diagnosis: Anemia [157262]  Admitting Physician: [name], [name] [[id number]]  Attending Physician: Martyn Malay [[id number]]  Estimated length of stay: past midnight tomorrow  Certification:: I certify this patient will need inpatient services for at least 2 midnights  PT Class (Do Not Modify): Inpatient [101]  PT Acc Code (Do Not Modify): Private [1]       B Medical/Surgery History Past Medical History:  Diagnosis Date  . Cellulitis of scalp 02/2015  . Depression   . Diabetes mellitus without complication ([place])   . Hypertension    Past Surgical History:  Procedure Laterality Date  . ABDOMINAL HYSTERECTOMY    . BIOPSY  01/07/2018   Procedure: BIOPSY;  Surgeon: Irene Shipper, MD;  Location: [facility] ENDOSCOPY;  Service: Endoscopy;;  Clotest  . COLONOSCOPY WITH PROPOFOL N/A 01/07/2018   Procedure: COLONOSCOPY WITH PROPOFOL;  Surgeon: Irene Shipper, MD;  Location: [facility] ENDOSCOPY;  Service: Endoscopy;  Laterality: N/A;  . ESOPHAGOGASTRODUODENOSCOPY (EGD) WITH PROPOFOL N/A  01/07/2018   Procedure: ESOPHAGOGASTRODUODENOSCOPY (EGD) WITH PROPOFOL;  Surgeon: Irene Shipper, MD;  Location: [facility] ENDOSCOPY;  Service: Endoscopy;  Laterality: N/A;  . POLYPECTOMY  01/07/2018   Procedure: POLYPECTOMY;  Surgeon: Irene Shipper, MD;  Location[facility] ENDOSCOPY;  Service: Endoscopy;;     A IV Location/Drains/Wounds Patient Lines/Drains/Airways Status   Active Line/Drains/Airways    Name:   Placement date:   Placement time:   Site:   Days:   Peripheral IV 12/13/18 Right Antecubital   12/13/18    1000    Antecubital   less than 1   Peripheral IV 12/13/18 Right Hand   12/13/18    1103    Hand   less than 1          Intake/Output Last 24 hours  Intake/Output Summary (Last 24 hours) at 12/13/2018 1654 Last data filed at 12/13/2018 1451 Gross per 24 hour  Intake 1260 ml  Output -  Net 1260 ml    Labs/Imaging Results for orders placed or performed during the hospital encounter of 12/13/18 (from the past 48 hour(s))  CBC with Differential     Status: Abnormal   Collection Time: 12/13/18  7:40 AM  Result Value Ref Range   WBC 8.5 4.0 - 10.5 K/uL   RBC 1.69 (L) 3.87 - 5.11 MIL/uL   Hemoglobin 4.7 (LL) 12.0 - 15.0 g/dL    Comment: REPEATED TO VERIFY THIS CRITICAL RESULT HAS VERIFIED AND BEEN CALLED TO LESLIE SHULAR RN BY SHANNON Chelsea ON 11 10 2020 AT  0849, AND HAS BEEN READ BACK.     HCT 15.4 (L) 36.0 - 46.0 %   MCV 91.1 80.0 - 100.0 fL   MCH 27.8 26.0 - 34.0 pg   MCHC 30.5 30.0 - 36.0 g/dL   RDW 14.8 11.5 - 15.5 %   Platelets 313 150 - 400 K/uL   nRBC 0.0 0.0 - 0.2 %   Neutrophils Relative % 67 %   Neutro Abs 5.6 1.7 - 7.7 K/uL   Lymphocytes Relative 25 %   Lymphs Abs 2.1 0.7 - 4.0 K/uL   Monocytes Relative 6 %   Monocytes Absolute 0.5 0.1 - 1.0 K/uL   Eosinophils Relative 2 %   Eosinophils Absolute 0.2 0.0 - 0.5 K/uL   Basophils Relative 0 %   Basophils Absolute 0.0 0.0 - 0.1 K/uL   Immature Granulocytes 0 %   Abs Immature Granulocytes 0.03 0.00 - 0.07 K/uL     Comment: Performed at [facility] [address] [place], [place] [address]  Basic metabolic panel     Status: Abnormal   Collection Time: 12/13/18  7:40 AM  Result Value Ref Range   Sodium 139 135 - 145 mmol/L   Potassium 3.2 (L) 3.5 - 5.1 mmol/L   Chloride 115 (H) 98 - 111 mmol/L   CO2 16 (L) 22 - 32 mmol/L   Glucose, Bld 183 (H) 70 - 99 mg/dL   BUN 23 8 - 23 mg/dL   Creatinine, Ser 3.20 (H) 0.44 - 1.00 mg/dL   Calcium 9.3 8.9 - 10.3 mg/dL   GFR calc non Af Amer 14 (L) >60 mL/min   GFR calc Af Amer 17 (L) >60 mL/min   Anion gap 8 5 - 15    Comment: Performed at [facility] [address] [place], [place] [address]  Sample to Blood Bank     Status: None   Collection Time: 12/13/18  7:40 AM  Result Value Ref Range   Blood Bank Specimen SAMPLE AVAILABLE FOR TESTING    Sample Expiration      12/14/2018,2359 Performed at [facility], [place] [address] [place], [place] [address]   Type and screen [place]     Status: None (Preliminary result)   Collection Time: 12/13/18  7:40 AM  Result Value Ref Range   ABO/RH(D) O POS    Antibody Screen NEG    Sample Expiration 12/16/2018,2359    Unit Number [id number]    Blood Component Type RED CELLS,LR    Unit division 00    Status of Unit ISSUED    Transfusion Status OK TO TRANSFUSE    Crossmatch Result Compatible    Unit Number [id number]    Blood Component Type RED CELLS,LR    Unit division 00    Status of Unit ISSUED    Transfusion Status OK TO TRANSFUSE    Crossmatch Result Compatible    Unit Number [id number]    Blood Component Type RED CELLS,LR    Unit division 00    Status of Unit ALLOCATED    Transfusion Status OK TO TRANSFUSE    Crossmatch Result Compatible    Unit Number [id number]    Blood Component Type RED CELLS,LR    Unit division 00    Status of Unit ISSUED    Transfusion Status OK TO TRANSFUSE    Crossmatch Result      Compatible Performed at [facility], [place],  [place] [address]    Unit Number [id number]    Blood Component Type RBC LR PHER1    Unit division 00    Status of Unit ALLOCATED    Transfusion Status OK TO TRANSFUSE    Crossmatch Result Compatible   Prepare RBC     Status: None   Collection Time: 12/13/18  7:40 AM  Result Value Ref Range   Order Confirmation      ORDER PROCESSED BY BLOOD BANK Performed at [facility], [place] [address], [place], [place] [address]   POC occult blood, ED Provider will collect     Status: Abnormal   Collection Time: 12/13/18  9:44 AM  Result Value Ref Range   Fecal Occult Bld POSITIVE (A) NEGATIVE  SARS CORONAVIRUS 2 (TAT 6-24 HRS) Nasopharyngeal Nasopharyngeal Swab     Status: None   Collection Time: 12/13/18 10:08 AM   Specimen: Nasopharyngeal Swab  Result Value Ref Range   SARS Coronavirus 2 NEGATIVE NEGATIVE    Comment: (NOTE) SARS-CoV-2 target nucleic acids are NOT DETECTED. The SARS-CoV-2 RNA is generally detectable in upper and lower respiratory specimens during the acute phase of infection. Negative results do not preclude SARS-CoV-2 infection, do not rule out co-infections with other pathogens, and should not be used as the sole basis for treatment or other patient management decisions. Negative results must be combined with clinical observations, patient history, and epidemiological information. The expected result is Negative. Fact Sheet for Patients: SugarRoll.be Fact Sheet for Healthcare Providers: https://www.woods-mathews.com/ This test is not yet approved or cleared by the [place] FDA and  has been authorized for detection and/or diagnosis of SARS-CoV-2 by FDA under an Emergency Use Authorization (EUA). This EUA will remain  in effect (meaning this test can be used) for the duration of the COVID-19 declaration under Section 56 4(b)(1) of the Act, 21 U.S.C. section 360bbb-3(b)(1), unless  the authorization is terminated or revoked sooner. Performed at [facility], [place] [address] [place], [place] [address]   Vitamin B12     Status: None   Collection Time: 12/13/18 10:20 AM  Result Value Ref Range   Vitamin B-12 266 180 - 914 pg/mL    Comment: (NOTE) This assay is not validated for testing neonatal or myeloproliferative syndrome specimens for Vitamin B12 levels. Performed at [facility], [place] [address] [place], [place] [address]   Folate     Status: None   Collection Time: 12/13/18 10:20 AM  Result Value Ref Range   Folate 12.9 >5.9 ng/mL    Comment: Performed at [facility] [address], [place], [place] [address]  Iron and TIBC     Status: Abnormal   Collection Time: 12/13/18 10:20 AM  Result Value Ref Range   Iron 24 (L) 28 - 170 ug/dL   TIBC 395 250 - 450 ug/dL   Saturation Ratios 6 (L) 10.4 - 31.8 %   UIBC 371 ug/dL    Comment: Performed at [facility], [place] [address], [place], [place] [address]  Ferritin     Status: Abnormal   Collection Time: 12/13/18 10:20 AM  Result Value Ref Range   Ferritin 7 (L) 11 - 307 ng/mL    Comment: Performed at [facility], [place] [address] [place], [place] [address]  Reticulocytes     Status: Abnormal   Collection Time: 12/13/18 10:20 AM  Result Value Ref Range   Retic Ct Pct 3.9 (H) 0.4 -  3.1 %   RBC. 1.97 (L) 3.87 - 5.11 MIL/uL   Retic Count, Absolute 76.8 19.0 - 186.0 K/uL   Immature Retic Fract 29.6 (H) 2.3 - 15.9 %    Comment: Performed at [facility] [address] [place], [place] [address]  Prepare RBC     Status: None   Collection Time: 12/13/18 12:19 PM  Result Value Ref Range   Order Confirmation      ORDER PROCESSED BY BLOOD BANK Performed at [facility], [place] [address] [place], [place] [address]   Hepatic function panel     Status: Abnormal   Collection Time: 12/13/18  2:55 PM  Result Value Ref Range   Total Protein 5.5 (L) 6.5 - 8.1 g/dL    Albumin 3.2 (L) 3.5 - 5.0 g/dL   AST 11 (L) 15 - 41 U/L   ALT 10 0 - 44 U/L   Alkaline Phosphatase 107 38 - 126 U/L   Total Bilirubin 0.4 0.3 - 1.2 mg/dL   Bilirubin, Direct <0.1 0.0 - 0.2 mg/dL   Indirect Bilirubin NOT CALCULATED 0.3 - 0.9 mg/dL    Comment: Performed at [facility] [address], [place], [place] [address]  Protime-INR     Status: None   Collection Time: 12/13/18  2:55 PM  Result Value Ref Range   Prothrombin Time 13.7 11.4 - 15.2 seconds   INR 1.1 0.8 - 1.2    Comment: (NOTE) INR goal varies based on device and disease states. Performed at [facility], [place] [address], [place], [place] [address]   Hemoglobin and hematocrit, blood     Status: Abnormal   Collection Time: 12/13/18  2:55 PM  Result Value Ref Range   Hemoglobin 7.4 (L) 12.0 - 15.0 g/dL    Comment: REPEATED TO VERIFY POST TRANSFUSION SPECIMEN    HCT 23.5 (L) 36.0 - 46.0 %    Comment: Performed at [facility] [address], [place], [place] [address]   Dg Chest Portable 1 View  Result Date: 12/13/2018 CLINICAL DATA:  Weakness, body aches EXAM: PORTABLE CHEST 1 VIEW COMPARISON:  None. FINDINGS: Lungs are clear. No pleural effusion. The cardiomediastinal silhouette is within limits with normal size. No acute osseous abnormality. IMPRESSION: No acute process in the chest. Electronically Signed   By: Macy Mis M.D.   On: 12/13/2018 10:00    Pending Labs Unresulted Labs (From admission, onward)    Start     Ordered   12/14/18 9357  Basic metabolic panel  Tomorrow morning,   R     12/13/18 1214   12/14/18 0500  CBC  Tomorrow morning,   R     12/13/18 1214          Vitals/Pain Today's Vitals   12/13/18 1515 12/13/18 1530 12/13/18 1600 12/13/18 1640  BP:  (!) 198/81 (!) 206/88 (!) 191/77  Pulse:  60  62  Resp:  17 16 17   Temp:    98.1 F (36.7 C)  TempSrc:    Oral  SpO2:  100%  100%  Weight:      Height:      PainSc: 0-No pain       Isolation  Precautions No active isolations  Medications Medications  pantoprazole (PROTONIX) injection 40 mg (40 mg Intravenous Given 12/13/18 1103)  carvedilol (COREG) tablet 25 mg (25 mg Oral Given 12/13/18 1217)  atorvastatin (LIPITOR) tablet 80 mg (has no administration in time  range)  acetaminophen (TYLENOL) tablet 650 mg (has no administration in time range)    Or  acetaminophen (TYLENOL) suppository 650 mg (has no administration in time range)  polyethylene glycol (MIRALAX / GLYCOLAX) packet 17 g (has no administration in time range)  amLODipine (NORVASC) tablet 5 mg (has no administration in time range)  0.9 %  sodium chloride infusion (Manually program via Guardrails IV Fluids) ( Intravenous New Bag/Given 12/13/18 1006)  diphenhydrAMINE (BENADRYL) capsule 25 mg (25 mg Oral Given 12/13/18 1158)    Mobility walks Low fall risk   Focused Assessments GI: + occult blood; c/o generalized weakness.    R Recommendations: See Admitting Provider Note  Report given to:   Additional Notes: 3 out of 3 units of PRBCs; 3rd one currently running. Clears til midnight. Endoscopy tomorrow

## 2018-12-13 NOTE — H&P (Addendum)
[facility] Admission History and Physical Service Pager: [phone]  Patient name: Sara Davis Medical record number: [id number] Date of birth: [birth year] Age: 68 y.o. Gender: female  Primary Care Provider: Neriyah Cercone, [name], DO Consultants: GI Code Status: Full (confirmed in the ED)  Chief Complaint: DOE  Assessment and Plan: Sara Davis is a 68 y.o. female presenting with anemia and DOE . PMH is significant for HTN, h/o anemia, emphysema, HFpEF, T2DM, h/o gastric ulcer 2/2 H/ pylori  Dyspnea on exertion secondary to symptomatic anemia Patient presented to office on 11/9 endorsing 2 weeks of increased shortness of breath on exertion. VSS stable in ED with some elevated BP at 186/84 as last BP.  Patient afebrile without white count.  Hemoglobin 4.7. Patient currently receiving 2 units PRBCs.  Patient denies any blood in stool, dark stools.  Does report that she had a 20-minute nosebleed 2 days ago.  She denies any use of NSAIDs.  States that she did not follow-up with GI after her treatment for H. pylori in 01/2018.  FOBT positive in the ED.  No obvious active bleeding on exam.  Anemia panel pending which was collected prior to blood transfusions.  GI consulted in the ED.  Most likely etiology of shortness of breath is secondary to anemia.  Unclear cause of anemia.  Anemia panel pending.  Patient does have worsening kidney disease and this could be anemia of chronic disease, no obvious signs of bleeding.  Not likely that this was from a nosebleed.  Patient was to have further work-up with echocardiogram and rule out sarcoidosis if echo remained changed but she was lost to follow-up.  Sarcoidosis can cause bone marrow suppression and will keep this on the differential pending her echocardiogram.  Patient reports that she has been taking her ferrous sulfate.  -Admit to telemetry, attending Dr. Owens Shark -SCDs for prophylaxis -currently receiving 2 units PRBCs, F/U  posttransfusion H&H -Protonix IV twice daily -Will consult GI, appreciate recommendations -Vitals per routine  HFpEF  pulmonary hypertension Echo 01/06/2018 with EF 60-65%, G2 DD, severely increased systolic pressure with PA peak pressure of 75 mmHg.  During admission on 01/2018 patient was to have repeat echocardiogram in 3 months, however patient was lost to follow-up and did not have this done. On Coreg 25 mg twice daily and Lasix 40 mg daily at home.  CXR in ED clear with no signs of volume overload. -Repeat echocardiogram. - Consider cardiology consult [facility] cardiovascular) pending echo results -Continue home Coreg, hold Lasix given AKI - pending ECHO will obtain labs previously recommended by Dr. Virgina Jock: If pulmonary hypertension persists, may need further workup with PFT, V/Q scan, HRCT, right heart catheterization. Consider ESR, CRP, ANA, HIV -monitor fluid status, diurese as necessary -Strict I's and O's, daily weights -N.p.o. pending GI evaluation  AKI on CKD stage IV Cr 3.20, baseline ~2 last time she was discharged from hospital in 02/2018.  Patient previously referred to [place] [facility] but has been lost to follow-up.  Renal ultrasound in 2018 grossly normal. -Trend BMP / urinary output -Replace electrolytes as indicated -Avoid nephrotoxic agents, ensure adequate renal perfusion -Will need to discontinue metformin given GFR less than 30 on discharge. -Hold home Lasix in setting of AKI and euvolemia on exam, monitor closely after blood transfusions and may need to reinitiate  T2DM Last A1c 7.8 12/12/2018.  At home on Novolin 70/30 20 units BID and metformin 1000 mg BID.  Given worsening renal function patient will need to  discontinue Metformin at discharge  -SSI  Hypertension On arrival with BP 148/78.  At home on Coreg 12.5 mg BID.   -monitor blood pressures   Hyperlipidemia At home on Lipitor 20 mg daily.  -continue home lipitor   Tobacco use disorder 1  pack/day since 68 years old, has since decreased to half pack per day.  Interested in nicotine patch while admitted.  -Nicotine patch, 14 mg  FEN/GI: NPO Prophylaxis: SCD's  Disposition: admit to tele  History of Present Illness:  Sara Davis is a 68 y.o. female presenting with 2 weeks of worsening dyspnea on exertion.  Patient denies any associated chest pain, leg swelling.  States that she has been compliant with all of her medications at home including iron.  She denies any abdominal pain, blood in stool, dark stools, hematochezia.  Patient reports that she had a nosebleed 2 days ago that took 20 minutes to stop.  She is not on any blood thinners.  She denies any use of NSAIDs.  She states that she did not follow-up with GI, cardiology or nephrology who are all recommended follow-ups at her last admission in 01/2018.  Patient previously taking care of her mom and was unable to follow-up, however patient states that she was feeling fine which is why she did not follow-up this time as she is no longer her mom's primary caregiver.  She states that prior to 2 weeks ago she was feeling great and was taking all of the medication she had previously been told to take.  Review Of Systems: Per HPI with the following additions:   Review of Systems  Constitutional: Positive for chills. Negative for fever.  HENT: Positive for nosebleeds. Negative for congestion and sore throat.   Eyes: Negative for blurred vision.  Respiratory: Positive for cough and shortness of breath.   Cardiovascular: Positive for palpitations and orthopnea. Negative for chest pain and leg swelling.  Gastrointestinal: Negative for abdominal pain, blood in stool, constipation, diarrhea, melena, nausea and vomiting.  Genitourinary: Negative for dysuria, frequency and urgency.  Musculoskeletal: Positive for back pain.  Neurological: Positive for dizziness. Negative for weakness and headaches.   Patient Active Problem List    Diagnosis Date Noted  . Dyspnea on exertion 12/13/2018  . Low back pain 12/13/2018  . Emphysema of lung ([place]) 03/07/2018  . Acute gastric ulcer without hemorrhage or perforation   . Angiodysplasia of duodenum   . Benign neoplasm of transverse colon   . Benign neoplasm of sigmoid colon   . CHF exacerbation ([place]) 01/06/2018  . Bilateral leg edema 01/06/2018  . Pancreatic cyst 12/03/2016  . Anemia 12/03/2016  . Protein-calorie malnutrition, severe 11/25/2016  . Chest pain   . Onychomycosis 07/30/2015  . Essential hypertension   . Headache 04/18/2014  . Insomnia 07/31/2013  . Uncontrolled type 2 diabetes mellitus ([place]) 01/14/2012  . Tobacco abuse 01/14/2012    Past Medical History: Past Medical History:  Diagnosis Date  . Cellulitis of scalp 02/2015  . Depression   . Diabetes mellitus without complication ([place])   . Hypertension     Past Surgical History: Past Surgical History:  Procedure Laterality Date  . ABDOMINAL HYSTERECTOMY    . BIOPSY  01/07/2018   Procedure: BIOPSY;  Surgeon: Irene Shipper, MD;  Location: [facility] ENDOSCOPY;  Service: Endoscopy;;  Clotest  . COLONOSCOPY WITH PROPOFOL N/A 01/07/2018   Procedure: COLONOSCOPY WITH PROPOFOL;  Surgeon: Irene Shipper, MD;  Location[facility] ENDOSCOPY;  Service: Endoscopy;  Laterality: N/A;  .  ESOPHAGOGASTRODUODENOSCOPY (EGD) WITH PROPOFOL N/A 01/07/2018   Procedure: ESOPHAGOGASTRODUODENOSCOPY (EGD) WITH PROPOFOL;  Surgeon: Irene Shipper, MD;  Location: [facility] ENDOSCOPY;  Service: Endoscopy;  Laterality: N/A;  . POLYPECTOMY  01/07/2018   Procedure: POLYPECTOMY;  Surgeon: Irene Shipper, MD;  Location: [facility] ENDOSCOPY;  Service: Endoscopy;;    Social History: Social History   Tobacco Use  . Smoking status: Current Every Day Smoker    Packs/day: 0.50    Years: 40.00    Pack years: 20.00    Types: Cigarettes  . Smokeless tobacco: Never Used  . Tobacco comment: down from 1.5 ppd  Substance Use Topics  . Alcohol use: No    Alcohol/week: 0.0  standard drinks  . Drug use: No   Additional social history: smokes 1/2 ppd, lives with fiance, no illicit drug use, no alcohol use Please also refer to relevant sections of EMR.  Family History: Family History  Problem Relation Age of Onset  . Diabetes Mother   . Cancer Mother   . Diabetes Sister   . Hypertension Sister   . Cancer Sister 20       Bone  . Diabetes Brother   . Diabetes Sister   . Diabetes Sister     Allergies and Medications: No Known Allergies No current facility-administered medications on file prior to encounter.    Current Outpatient Medications on File Prior to Encounter  Medication Sig Dispense Refill  . acetaminophen (TYLENOL) 500 MG tablet Take 1,000 mg by mouth every 6 (six) hours as needed for mild pain or fever.    . Alcohol Swabs (B-D SINGLE USE SWABS REGULAR) PADS 1 Units by Does not apply route 3 (three) times daily as needed. 90 each 2  . amLODipine (NORVASC) 5 MG tablet Take 1 tablet (5 mg total) by mouth daily. 90 tablet 3  . atorvastatin (LIPITOR) 40 MG tablet Take 1 tablet (40 mg total) by mouth daily. 90 tablet 3  . Blood Glucose Calibration (ACCU-CHEK AVIVA) SOLN 1 Units by In Vitro route 3 (three) times daily as needed. 90 each 0  . Blood Glucose Monitoring Suppl (ACCU-CHEK AVIVA) device Use as instructed 1 each 0  . Blood Glucose Monitoring Suppl (CONTOUR BLOOD GLUCOSE SYSTEM) DEVI Use to test blood sugars once in the morning before food and once in the evening.DX: 250.02 1 Device 0  . carvedilol (COREG) 25 MG tablet Take 1 tablet (25 mg total) by mouth 2 (two) times daily. 180 tablet 3  . ferrous sulfate 325 (65 FE) MG tablet Take 1 tablet (325 mg total) by mouth every other day. 45 tablet 2  . furosemide (LASIX) 40 MG tablet Take 1 tablet (40 mg total) by mouth daily. 90 tablet 0  . glucose blood (ACCU-CHEK AVIVA PLUS) test strip Use as instructed 100 each 12  . glucose blood (ONE TOUCH TEST STRIPS) test strip Please check blood sugar once  daily in AM before eating and once daily in PM 100 each 12  . insulin NPH-regular Human (70-30) 100 UNIT/ML injection Inject 22 Units into the skin 2 (two) times daily. 1 vial 2  . Insulin Pen Needle (B-D ULTRAFINE III SHORT PEN) 31G X 8 MM MISC CHECK BLOOD SUGARS ONCE IN MORNING BEFORE FOOD AND ONCE IN THE EVENING. 100 each 0  . Lancets (ACCU-CHEK SOFT TOUCH) lancets Use as instructed 100 each 12  . Lancets (ONETOUCH ULTRASOFT) lancets Please check blood sugar once daily in AM before eating and once daily in PM 100 each  12  . metFORMIN (GLUCOPHAGE) 1000 MG tablet Take 1 tablet (1,000 mg total) by mouth 2 (two) times daily with a meal. 180 tablet 2  . pantoprazole (PROTONIX) 40 MG tablet Take 1 tablet (40 mg total) by mouth 2 (two) times daily. Take for H. Pylori treatment and then continue once per day, as before. 28 tablet 0    Objective: BP (!) 186/84 (BP Location: Left Arm)   Pulse 76   Temp 98.3 F (36.8 C) (Oral)   Resp 20   Ht _0  (1.676 m)   Wt 40.8 kg   SpO2 100%   BMI 14.52 kg/m  Exam: General: NAD, pleasant Eyes: PERRL, EOMI, no conjunctival  Injection, pale conjunctiva ENTM: Moist mucous membranes, no pharyngeal erythema or exudate Neck: Supple, no LAD Cardiovascular: RRR, no m/r/g, no LE edema Respiratory: CTA BL, normal work of breathing Gastrointestinal: soft, nontender, nondistended, normoactive BS MSK: moves 4 extremities equally Derm: no rashes appreciated Neuro: CN II-XII grossly intact Psych: AOx3, appropriate affect  Labs and Imaging: CBC BMET  Recent Labs  Lab 12/13/18 0740  WBC 8.5  HGB 4.7*  HCT 15.4*  PLT 313   Recent Labs  Lab 12/13/18 0740  NA 139  K 3.2*  CL 115*  CO2 16*  BUN 23  CREATININE 3.20*  GLUCOSE 183*  CALCIUM 9.3     Dg Chest Portable 1 View  Result Date: 12/13/2018 CLINICAL DATA:  Weakness, body aches EXAM: PORTABLE CHEST 1 VIEW COMPARISON:  None. FINDINGS: Lungs are clear. No pleural effusion. The cardiomediastinal  silhouette is within limits with normal size. No acute osseous abnormality. IMPRESSION: No acute process in the chest. Electronically Signed   By: Macy Mis M.D.   On: 12/13/2018 10:00     Ithzel Fedorchak, [name], DO 12/13/2018, 10:27 AM PGY-3, [place] Intern pager: [phone], text pages welcome

## 2018-12-13 NOTE — ED Provider Notes (Signed)
[facility] EMERGENCY DEPARTMENT Provider Note   CSN: [id number] Arrival date & time: 12/13/18  0707     History   Chief Complaint Chief Complaint  Patient presents with   Abnormal Lab    HPI Sara Davis is a 68 y.o. female.     68 y.o female with a  PMH of CHF, DM, AKI gastric ulcer presents to the ED with a chief complaint of weakness x2 weeks.  Patient was instructed by her PCP to be seen in the ED as her labs were drawn yesterday after her bedside and showed a hemoglobin of 5.1.  Patient reports has been feeling unwell for the past 2 weeks having increase in weakness, bilateral leg pain, difficulty walking along with shortness of breath with exertion.  She also endorses pica,, states she has had several cups of ice.  She does have a prior episode of a transfusion in the ED, this was due to a lower GI bleed, unknown of the cause.  He also endorses a headache, feels like she is somewhat foggy.  Patient's last bowel movement was over the weekend, states she is likely constipated but has not seen any blood in her stool.  She denies any hematemesis, melena, chest pain, syncope.  The history is provided by the patient and medical records.  Abnormal Lab   Past Medical History:  Diagnosis Date   Cellulitis of scalp 02/2015   Depression    Diabetes mellitus without complication ([place])    Hypertension     Patient Active Problem List   Diagnosis Date Noted   Dyspnea on exertion 12/13/2018   Low back pain 12/13/2018   Emphysema of lung ([place]) 03/07/2018   Acute gastric ulcer without hemorrhage or perforation    Angiodysplasia of duodenum    Benign neoplasm of transverse colon    Benign neoplasm of sigmoid colon    CHF exacerbation ([place]) 01/06/2018   Bilateral leg edema 01/06/2018   Pancreatic cyst 12/03/2016   Anemia 12/03/2016   Protein-calorie malnutrition, severe 11/25/2016   Chest pain    Onychomycosis 07/30/2015   Essential  hypertension    Headache 04/18/2014   Insomnia 07/31/2013   Uncontrolled type 2 diabetes mellitus ([place]) 01/14/2012   Tobacco abuse 01/14/2012    Past Surgical History:  Procedure Laterality Date   ABDOMINAL HYSTERECTOMY     BIOPSY  01/07/2018   Procedure: BIOPSY;  Surgeon: Irene Shipper, MD;  Location: [facility] ENDOSCOPY;  Service: Endoscopy;;  Clotest   COLONOSCOPY WITH PROPOFOL N/A 01/07/2018   Procedure: COLONOSCOPY WITH PROPOFOL;  Surgeon: Irene Shipper, MD;  Location: [facility] ENDOSCOPY;  Service: Endoscopy;  Laterality: N/A;   ESOPHAGOGASTRODUODENOSCOPY (EGD) WITH PROPOFOL N/A 01/07/2018   Procedure: ESOPHAGOGASTRODUODENOSCOPY (EGD) WITH PROPOFOL;  Surgeon: Irene Shipper, MD;  Location: [facility] ENDOSCOPY;  Service: Endoscopy;  Laterality: N/A;   POLYPECTOMY  01/07/2018   Procedure: POLYPECTOMY;  Surgeon: Irene Shipper, MD;  Location: [facility] ENDOSCOPY;  Service: Endoscopy;;     OB History   No obstetric history on file.      Home Medications    Prior to Admission medications   Medication Sig Start Date End Date Taking? Authorizing Provider  acetaminophen (TYLENOL) 500 MG tablet Take 1,000 mg by mouth every 6 (six) hours as needed for mild pain or fever.    [provider]  Alcohol Swabs (B-D SINGLE USE SWABS REGULAR) PADS 1 Units by Does not apply route 3 (three) times daily as needed. 07/14/18  Enid Derry, [name], DO  amLODipine (NORVASC) 5 MG tablet Take 1 tablet (5 mg total) by mouth daily. 07/14/18   [name], [name], DO  atorvastatin (LIPITOR) 40 MG tablet Take 1 tablet (40 mg total) by mouth daily. 07/14/18   [name], [name], DO  Blood Glucose Calibration (ACCU-CHEK AVIVA) SOLN 1 Units by In Vitro route 3 (three) times daily as needed. 07/14/18   [name], [name], DO  Blood Glucose Monitoring Suppl (ACCU-CHEK AVIVA) device Use as instructed 07/14/18 07/14/19  [name], [name], DO  Blood Glucose Monitoring Suppl (CONTOUR BLOOD GLUCOSE SYSTEM) DEVI Use to test blood sugars once in the  morning before food and once in the evening.DX: 250.02 10/17/14   Smiley Houseman, MD  carvedilol (COREG) 25 MG tablet Take 1 tablet (25 mg total) by mouth 2 (two) times daily. 07/14/18   [name], [name], DO  ferrous sulfate 325 (65 FE) MG tablet Take 1 tablet (325 mg total) by mouth every other day. 02/23/18   [name], [name], DO  furosemide (LASIX) 40 MG tablet Take 1 tablet (40 mg total) by mouth daily. 07/14/18   [name], [name], DO  glucose blood (ACCU-CHEK AVIVA PLUS) test strip Use as instructed 07/14/18   [name], [name], DO  glucose blood (ONE TOUCH TEST STRIPS) test strip Please check blood sugar once daily in AM before eating and once daily in PM 10/17/14   Smiley Houseman, MD  insulin NPH-regular Human (70-30) 100 UNIT/ML injection Inject 22 Units into the skin 2 (two) times daily. 04/01/18   [name], [name], DO  Insulin Pen Needle (B-D ULTRAFINE III SHORT PEN) 31G X 8 MM MISC CHECK BLOOD SUGARS ONCE IN MORNING BEFORE FOOD AND ONCE IN THE EVENING. 07/14/18   Enid Derry, [name], DO  Lancets (ACCU-CHEK SOFT TOUCH) lancets Use as instructed 07/14/18   [name], [name], DO  Lancets [facility] ULTRASOFT) lancets Please check blood sugar once daily in AM before eating and once daily in PM 06/28/12   Cletus Gash, MD  metFORMIN (GLUCOPHAGE) 1000 MG tablet Take 1 tablet (1,000 mg total) by mouth 2 (two) times daily with a meal. 07/14/18   Enid Derry, [name], DO  pantoprazole (PROTONIX) 40 MG tablet Take 1 tablet (40 mg total) by mouth 2 (two) times daily. Take for H. Pylori treatment and then continue once per day, as before. 07/14/18   [name], [name], DO    Family History Family History  Problem Relation Age of Onset   Diabetes Mother    Cancer Mother    Diabetes Sister    Hypertension Sister    Cancer Sister 28       Bone   Diabetes Brother    Diabetes Sister    Diabetes Sister     Social History Social History   Tobacco Use   Smoking status: Current Every Day Smoker     Packs/day: 0.50    Years: 40.00    Pack years: 20.00    Types: Cigarettes   Smokeless tobacco: Never Used   Tobacco comment: down from 1.5 ppd  Substance Use Topics   Alcohol use: No    Alcohol/week: 0.0 standard drinks   Drug use: No     Allergies   Patient has no known allergies.   Review of Systems Review of Systems  Constitutional: Negative for chills and fever.  HENT: Negative for ear pain and sore throat.   Eyes: Negative for pain and visual disturbance.  Respiratory: Positive for shortness of breath. Negative for cough.   Cardiovascular: Negative for chest pain  and palpitations.  Gastrointestinal: Negative for abdominal pain and vomiting.  Genitourinary: Negative for dysuria and hematuria.  Musculoskeletal: Positive for myalgias. Negative for arthralgias and back pain.  Skin: Negative for color change and rash.  Neurological: Positive for weakness and headaches. Negative for seizures and syncope.  All other systems reviewed and are negative.    Physical Exam Updated Vital Signs BP (!) 186/84 (BP Location: Left Arm)    Pulse 76    Temp 98.3 F (36.8 C) (Oral)    Resp 20    Ht 5\' 6"  (1.676 m)    Wt 40.8 kg    SpO2 100%    BMI 14.52 kg/m   Physical Exam Vitals signs and nursing note reviewed.  Constitutional:      General: She is not in acute distress.    Appearance: Normal appearance. She is well-developed.  HENT:     Head: Normocephalic and atraumatic.     Mouth/Throat:     Mouth: Mucous membranes are dry.     Pharynx: No oropharyngeal exudate.  Eyes:     Pupils: Pupils are equal, round, and reactive to light.     Comments: Conjunctiva appears pale.  Neck:     Musculoskeletal: Normal range of motion.  Cardiovascular:     Rate and Rhythm: Regular rhythm.     Heart sounds: Normal heart sounds.     Comments: No bilateral pitting edema. Pulmonary:     Effort: Pulmonary effort is normal. No respiratory distress.     Breath sounds: Normal breath sounds.      Comments: Lungs are clear to auscultation without any wheezing or rales. Chest:     Comments: No tenderness with palpation of the chest wall. Abdominal:     General: Bowel sounds are normal. There is no distension.     Palpations: Abdomen is soft.     Tenderness: There is no abdominal tenderness.     Comments: Diminished soft, nondistended, nontender to palpation.  Musculoskeletal:        General: No tenderness or deformity.     Right lower leg: No edema.     Left lower leg: No edema.     Comments: No bilateral calf tenderness.  Skin:    General: Skin is warm.     Coloration: Skin is pale.     Findings: No bruising, erythema or lesion.  Neurological:     Mental Status: She is alert and oriented to person, place, and time.      ED Treatments / Results  Labs (all labs ordered are listed, but only abnormal results are displayed) Labs Reviewed  CBC WITH DIFFERENTIAL/PLATELET - Abnormal; Notable for the following components:      Result Value   RBC 1.69 (*)    Hemoglobin 4.7 (*)    HCT 15.4 (*)    All other components within normal limits  BASIC METABOLIC PANEL - Abnormal; Notable for the following components:   Potassium 3.2 (*)    Chloride 115 (*)    CO2 16 (*)    Glucose, Bld 183 (*)    Creatinine, Ser 3.20 (*)    GFR calc non Af Amer 14 (*)    GFR calc Af Amer 17 (*)    All other components within normal limits  POC OCCULT BLOOD, ED - Abnormal; Notable for the following components:   Fecal Occult Bld POSITIVE (*)    All other components within normal limits  SARS CORONAVIRUS 2 (TAT 6-24 HRS)  VITAMIN B12  FOLATE  IRON AND TIBC  FERRITIN  RETICULOCYTES  SAMPLE TO BLOOD BANK  TYPE AND SCREEN  PREPARE RBC (CROSSMATCH)    EKG None  Radiology Dg Chest Portable 1 View  Result Date: 12/13/2018 CLINICAL DATA:  Weakness, body aches EXAM: PORTABLE CHEST 1 VIEW COMPARISON:  None. FINDINGS: Lungs are clear. No pleural effusion. The cardiomediastinal silhouette  is within limits with normal size. No acute osseous abnormality. IMPRESSION: No acute process in the chest. Electronically Signed   By: Macy Mis M.D.   On: 12/13/2018 10:00    Procedures .Critical Care Performed by: Janeece Fitting, PA-C Authorized by: Janeece Fitting, PA-C   Critical care provider statement:    Critical care time (minutes):  45   Critical care start time:  12/13/2018 9:00 AM   Critical care end time:  12/13/2018 9:45 AM   Critical care time was exclusive of:  Separately billable procedures and treating other patients   Critical care was necessary to treat or prevent imminent or life-threatening deterioration of the following conditions:  Metabolic crisis   Critical care was time spent personally by me on the following activities:  Blood draw for specimens, development of treatment plan with patient or surrogate, discussions with consultants, evaluation of patient's response to treatment, examination of patient, obtaining history from patient or surrogate, ordering and performing treatments and interventions, ordering and review of laboratory studies, ordering and review of radiographic studies, pulse oximetry, re-evaluation of patient's condition and review of old charts   (including critical care time)  Medications Ordered in ED Medications  pantoprazole (PROTONIX) injection 40 mg (has no administration in time range)  0.9 %  sodium chloride infusion (Manually program via Guardrails IV Fluids) ( Intravenous New Bag/Given 12/13/18 1006)     Initial Impression / Assessment and Plan / ED Course  I have reviewed the triage vital signs and the nursing notes.  Pertinent labs & imaging results that were available during my care of the patient were reviewed by me and considered in my medical decision making (see chart for details).    Patient with a past medical history of symptomatic anemia, diabetes, hypertension, AKI presents to the ED with chief complaint of weakness x2  weeks.  Patient was seen by PCP yesterday, had screening blood work which revealed a hemoglobin of 5.1.  She was instructed to be seen in the ED.  She reports she has been feeling unwell for the past 2-week with increased weakness, shortness of breath, body aches along with bilateral leg pain.  She also endorses pica.  He was previously admitted on December 2019 for a similar episode.  He was found to have iron deficiency anemia then underwent transfusion along with GI consultation.  During my evaluation patient's skin appears pale, conjunctiva seems pale, skin is dry.  Lungs are clear to auscultation, chest wall is nontender to palpation.  She does have a prior history of CHF without any pitting edema on today's exam.  She also endorses shortness of breath, does endorse some orthopnea.  A Hemoccult was collected, grossly positive on my exam.  Will obtain chest x-ray to rule out any pulmonary edema prior to transfusing patient with 2 units of blood cells.  Suspect due to patient's critical care condition will likely be keeping patient in the hospital.     CBC to today's visit is remarkable for hemoglobin of 4.7.  No leukocytosis.  CMP with slight hypokalemia, glucose is slightly elevated, creatinine level is elevated at 3.2, this is  consistent with her prior visits.  No anion gap.  19 test has been ordered for patient.  10:33 AM spoke to patient's PCP Dr. Enid Derry will admit patient for further management.  Appreciate their help.  The 19 test has been sent off.  Portions of this note were generated with [employer]. Dictation errors may occur despite best attempts at proofreading.  Final Clinical Impressions(s) / ED Diagnoses   Final diagnoses:  Symptomatic anemia  Hypokalemia    ED Discharge Orders    None       Janeece Fitting, PA-C 12/13/18 [place], [place], MD 12/13/18 1341

## 2018-12-13 NOTE — ED Notes (Addendum)
Blood bank contacted about ensuring labs were already collected & blood was reported to be ready in 10-15 minutes.

## 2018-12-13 NOTE — Consult Note (Addendum)
Consultation  Referring Provider:   Dr. Owens Shark    Primary Care Physician:  Sara Davis, [name], DO Primary Gastroenterologist: Althia Forts       Reason for Consultation: Anemia             HPI:   Sara Davis is a 68 y.o. female with a past medical history of CHF, diabetes, AKI and gastric ulcer, who presented to the ED this morning with a complaint of weakness for 2 weeks.  Apparently had labs drawn by her PCP yesterday which showed a hemoglobin of 5.1.    Today, patient reports feeling unwell for the past 2 weeks with increasing weakness, bilateral leg pain, difficulty walking along with dyspnea on exertion.  Does endorse pica eating several cups of ice a day.  Explains that she is on chronic iron for anemia.  Also on Pantoprazole twice a day ever since finding of H. pylori at the end of last year.  Tells me the only change in her GI system is that she was constipated for the past 2 weeks and drank some Epsom salt water over the weekend which resulted in significant stools, initially hard and then diarrhea.  No black tarry stools or bright red blood.  Patient felt completely empty after bowel movements.  None since.  Associated symptoms include a headache.    Denies fever, chills, weight loss, nausea, vomiting, heartburn, reflux or abdominal pain.  ER course: Hemoglobin 4.7, ordered 2 u prbcs, potassium 3.2, creatinine 3.2, fecal occult positive, Covid test pending  GI history: 01/07/2018 EGD for iron deficiency anemia Dr. 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 Henrene Pastor: 3 4-8 mm polyps in sigmoid colon and in the transverse colon, otherwise normal, pathology was a mix of hyperplastic and tubular adenomatous recall recommended in 5 years  Past Medical History:  Diagnosis Date  . Cellulitis of scalp  02/2015  . Depression   . Diabetes mellitus without complication ([place])   . Hypertension     Past Surgical History:  Procedure Laterality Date  . ABDOMINAL HYSTERECTOMY    . BIOPSY  01/07/2018   Procedure: BIOPSY;  Surgeon: Irene Shipper, MD;  Location: [facility] ENDOSCOPY;  Service: Endoscopy;;  Clotest  . COLONOSCOPY WITH PROPOFOL N/A 01/07/2018   Procedure: COLONOSCOPY WITH PROPOFOL;  Surgeon: Irene Shipper, MD;  Location: [facility] ENDOSCOPY;  Service: Endoscopy;  Laterality: N/A;  . ESOPHAGOGASTRODUODENOSCOPY (EGD) WITH PROPOFOL N/A 01/07/2018   Procedure: ESOPHAGOGASTRODUODENOSCOPY (EGD) WITH PROPOFOL;  Surgeon: Irene Shipper, MD;  Location: [facility] ENDOSCOPY;  Service: Endoscopy;  Laterality: N/A;  . POLYPECTOMY  01/07/2018   Procedure: POLYPECTOMY;  Surgeon: Irene Shipper, MD;  Location: [facility] ENDOSCOPY;  Service: Endoscopy;;    Family History  Problem Relation Age of Onset  . Diabetes Mother   . Cancer Mother   . Diabetes Sister   . Hypertension Sister   . Cancer Sister 70       Bone  . Diabetes Brother   . Diabetes Sister   . Diabetes Sister      Social History   Tobacco Use  . Smoking status: Current Every Day Smoker    Packs/day: 0.50    Years: 40.00    Pack years: 20.00    Types: Cigarettes  . Smokeless tobacco: Never Used  . Tobacco comment: down from 1.5 ppd  Substance  Use Topics  . Alcohol use: No    Alcohol/week: 0.0 standard drinks  . Drug use: No    Prior to Admission medications   Medication Sig Start Date End Date Taking? Authorizing Provider  acetaminophen (TYLENOL) 500 MG tablet Take 1,000 mg by mouth every 6 (six) hours as needed for mild pain or fever.    [provider]  Alcohol Swabs (B-D SINGLE USE SWABS REGULAR) PADS 1 Units by Does not apply route 3 (three) times daily as needed. 07/14/18   Sara Davis, [name], DO  amLODipine (NORVASC) 5 MG tablet Take 1 tablet (5 mg total) by mouth daily. 07/14/18   Sara Davis, [name], DO  atorvastatin (LIPITOR) 40 MG tablet  Take 1 tablet (40 mg total) by mouth daily. 07/14/18   Sara Davis, [name], DO  Blood Glucose Calibration (ACCU-CHEK AVIVA) SOLN 1 Units by In Vitro route 3 (three) times daily as needed. 07/14/18   Sara Davis, [name], DO  Blood Glucose Monitoring Suppl (ACCU-CHEK AVIVA) device Use as instructed 07/14/18 07/14/19  Sara Davis, [name], DO  Blood Glucose Monitoring Suppl (CONTOUR BLOOD GLUCOSE SYSTEM) DEVI Use to test blood sugars once in the morning before food and once in the evening.DX: 250.02 10/17/14   Smiley Houseman, MD  carvedilol (COREG) 25 MG tablet Take 1 tablet (25 mg total) by mouth 2 (two) times daily. 07/14/18   Sara Davis, [name], DO  ferrous sulfate 325 (65 FE) MG tablet Take 1 tablet (325 mg total) by mouth every other day. 02/23/18   Sara Davis, [name], DO  furosemide (LASIX) 40 MG tablet Take 1 tablet (40 mg total) by mouth daily. 07/14/18   Sara Davis, [name], DO  glucose blood (ACCU-CHEK AVIVA PLUS) test strip Use as instructed 07/14/18   Sara Davis, [name], DO  glucose blood (ONE TOUCH TEST STRIPS) test strip Please check blood sugar once daily in AM before eating and once daily in PM 10/17/14   Smiley Houseman, MD  insulin NPH-regular Human (70-30) 100 UNIT/ML injection Inject 22 Units into the skin 2 (two) times daily. 04/01/18   Sara Davis, [name], DO  Insulin Pen Needle (B-D ULTRAFINE III SHORT PEN) 31G X 8 MM MISC CHECK BLOOD SUGARS ONCE IN MORNING BEFORE FOOD AND ONCE IN THE EVENING. 07/14/18   Enid Derry, [name], DO  Lancets (ACCU-CHEK SOFT TOUCH) lancets Use as instructed 07/14/18   Sara Davis, [name], DO  Lancets [facility] ULTRASOFT) lancets Please check blood sugar once daily in AM before eating and once daily in PM 06/28/12   Cletus Gash, MD  metFORMIN (GLUCOPHAGE) 1000 MG tablet Take 1 tablet (1,000 mg total) by mouth 2 (two) times daily with a meal. 07/14/18   Enid Derry, [name], DO  pantoprazole (PROTONIX) 40 MG tablet Take 1 tablet (40 mg total) by mouth 2 (two) times daily. Take for H. Pylori  treatment and then continue once per day, as before. 07/14/18   Sara Davis, [name], DO    Current Facility-Administered Medications  Medication Dose Route Frequency Provider Last Rate Last Dose  . diphenhydrAMINE (BENADRYL) capsule 25 mg  25 mg Oral Once [place], [name], DO      . pantoprazole (PROTONIX) injection 40 mg  40 mg Intravenous Q12H Sara Davis, [name], DO   40 mg at 12/13/18 1103   Current Outpatient Medications  Medication Sig Dispense Refill  . acetaminophen (TYLENOL) 500 MG tablet Take 1,000 mg by mouth every 6 (six) hours as needed for mild pain or fever.    . Alcohol Swabs (B-D SINGLE USE SWABS REGULAR) PADS 1 Units by Does not  apply route 3 (three) times daily as needed. 90 each 2  . amLODipine (NORVASC) 5 MG tablet Take 1 tablet (5 mg total) by mouth daily. 90 tablet 3  . atorvastatin (LIPITOR) 40 MG tablet Take 1 tablet (40 mg total) by mouth daily. 90 tablet 3  . Blood Glucose Calibration (ACCU-CHEK AVIVA) SOLN 1 Units by In Vitro route 3 (three) times daily as needed. 90 each 0  . Blood Glucose Monitoring Suppl (ACCU-CHEK AVIVA) device Use as instructed 1 each 0  . Blood Glucose Monitoring Suppl (CONTOUR BLOOD GLUCOSE SYSTEM) DEVI Use to test blood sugars once in the morning before food and once in the evening.DX: 250.02 1 Device 0  . carvedilol (COREG) 25 MG tablet Take 1 tablet (25 mg total) by mouth 2 (two) times daily. 180 tablet 3  . ferrous sulfate 325 (65 FE) MG tablet Take 1 tablet (325 mg total) by mouth every other day. 45 tablet 2  . furosemide (LASIX) 40 MG tablet Take 1 tablet (40 mg total) by mouth daily. 90 tablet 0  . glucose blood (ACCU-CHEK AVIVA PLUS) test strip Use as instructed 100 each 12  . glucose blood (ONE TOUCH TEST STRIPS) test strip Please check blood sugar once daily in AM before eating and once daily in PM 100 each 12  . insulin NPH-regular Human (70-30) 100 UNIT/ML injection Inject 22 Units into the skin 2 (two) times daily. 1 vial 2  . Insulin Pen  Needle (B-D ULTRAFINE III SHORT PEN) 31G X 8 MM MISC CHECK BLOOD SUGARS ONCE IN MORNING BEFORE FOOD AND ONCE IN THE EVENING. 100 each 0  . Lancets (ACCU-CHEK SOFT TOUCH) lancets Use as instructed 100 each 12  . Lancets (ONETOUCH ULTRASOFT) lancets Please check blood sugar once daily in AM before eating and once daily in PM 100 each 12  . metFORMIN (GLUCOPHAGE) 1000 MG tablet Take 1 tablet (1,000 mg total) by mouth 2 (two) times daily with a meal. 180 tablet 2  . pantoprazole (PROTONIX) 40 MG tablet Take 1 tablet (40 mg total) by mouth 2 (two) times daily. Take for H. Pylori treatment and then continue once per day, as before. 28 tablet 0    Allergies as of 12/13/2018  . (No Known Allergies)     Review of Systems:    Constitutional: No weight loss, fever or chills Skin: No rash  Cardiovascular: No chest pain Respiratory: +DOE Gastrointestinal: See HPI and otherwise negative Genitourinary: No dysuria Neurological: +headache Musculoskeletal: +muscle aches Hematologic: No bleeding or bruising Psychiatric: No history of depression or anxiety    Physical Exam:  Vital signs in last 24 hours: Temp:  [98.2 F (36.8 C)-98.4 F (36.9 C)] 98.3 F (36.8 C) (11/10 1021) Pulse Rate:  [72-76] 75 (11/10 1100) Resp:  [15-20] 17 (11/10 1100) BP: (165-198)/(64-84) 186/76 (11/10 1100) SpO2:  [99 %-100 %] 100 % (11/10 1100) Weight:  [40.8 kg] 40.8 kg (11/10 0714)   General:   Pleasant AA female appears to be in NAD, Well developed, Well nourished, alert and cooperative Head:  Normocephalic and atraumatic. Eyes:   PEERL, EOMI. No icterus. Conjunctiva pink. Ears:  Normal auditory acuity. Neck:  Supple Throat: Oral cavity and pharynx without inflammation, swelling or lesion.  Lungs: Respirations even and unlabored. Lungs clear to auscultation bilaterally.   No wheezes, crackles, or rhonchi.  Heart: Normal S1, S2. No MRG. Regular rate and rhythm. No peripheral edema, cyanosis or pallor.  Abdomen:   Soft, nondistended, nontender. No rebound or  guarding. Normal bowel sounds. No appreciable masses or hepatomegaly. Rectal:  Not performed.  Msk:  Symmetrical without gross deformities. Peripheral pulses intact.  Extremities:  Without edema, no deformity or joint abnormality.  Neurologic:  Alert and  oriented x4;  grossly normal neurologically.  Skin:   Dry and intact without significant lesions or rashes. Psychiatric: Demonstrates good judgement and reason without abnormal affect or behaviors.   LAB RESULTS: Recent Labs    12/12/18 1058 12/13/18 0740  WBC 10.7 8.5  HGB 5.1* 4.7*  HCT 16.5* 15.4*  PLT 339 313   BMET Recent Labs    12/12/18 1058 12/13/18 0740  NA 142 139  K 3.7 3.2*  CL 113* 115*  CO2 15* 16*  GLUCOSE 229* 183*  BUN 26 23  CREATININE 3.02* 3.20*  CALCIUM 9.6 9.3   STUDIES: Dg Chest Portable 1 View  Result Date: 12/13/2018 CLINICAL DATA:  Weakness, body aches EXAM: PORTABLE CHEST 1 VIEW COMPARISON:  None. FINDINGS: Lungs are clear. No pleural effusion. The cardiomediastinal silhouette is within limits with normal size. No acute osseous abnormality. IMPRESSION: No acute process in the chest. Electronically Signed   By: Macy Mis M.D.   On: 12/13/2018 10:00    Impression / Plan:   Impression: 1.  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 pylori with duodenal AVMs versus other 2.  CHF 3.  AKI: Creatinine 3.2  Plan: 1.  Plan for EGD tomorrow 12/14/2018 with Dr. Henrene Pastor.  Did discuss risks, benefits, limitations and alternatives and patient agrees to proceed. 2.  Ordered another unit of PRBCs to be given so that hopefully hemoglobin is above 7 tomorrow in time for procedures. 3.  Covid test is pending. 4.   Patient will be on clear liquid diet today and n.p.o. after midnight 5. Agree with Protonix 40mg  Iv BID 6.  Continue to monitor hemoglobin with transfusion as needed less than 7 7.  Please await further recommendations from Dr. Henrene Pastor later today.  Thank you for your kind consultation, we will continue to follow.  Lavone Nian Lemmon  12/13/2018, 11:47 AM  GI ATTENDING  History, laboratories, x-rays, prior endoscopy reports reviewed.  Patient seen and examined.  She presents with symptomatic anemia.  Occult positive stool but no obvious acute bleeding.  Has a history of iron deficiency anemia.  Colonoscopy and upper endoscopy last year as described.  Suspect GI blood loss from upper GI source such as AVMs or recurrent ulcer.  She is on iron.  Also, I believe her anemia is significantly impacted by her chronic renal insufficiency.  Plans for endoscopy tomorrow.  Patient is high risk given her comorbidities.  She will need adequate iron replacement.  Also, would recommend hematology involvement regarding her renal disease and other supplemental therapies that may maximize her hemoglobin levels.The nature of the procedure, as well as the risks, benefits, and alternatives were carefully and thoroughly reviewed with the patient. Ample time for discussion and questions allowed. The patient understood, was satisfied, and agreed to proceed.  Docia Chuck. Geri Seminole., M.D. [facility] Division of Gastroenterology

## 2018-12-13 NOTE — Telephone Encounter (Signed)
FPTS After Hours Emergency Note  I received a page from [facility] regarding a critical lab value for Ms. Sara Davis.  Her hematocrit is 16.5 and her hemoglobin is 5.1.  These labs were performed on 12/12/2018 due to the patient's shortness of breath and fatigue.  I will route this to her PCP and contact the patient in the morning to inform her of these results and advise her to go to the Emergency Department for further workup.  Brannen Koppen C. Shan Levans, MD PGY-3, [facility] 12/13/2018 12:20 AM

## 2018-12-13 NOTE — Telephone Encounter (Signed)
I called Ms. Spindel to inform her that I received a report overnight that her hemoglobin was 5.1, which means that she needs someone to drive her to the emergency department this morning.  She will likely need a blood transfusion and may need to be admitted.  I told her that her hemoglobin level was too dangerous not to be replenished, and we would likely need to admit her in order to figure out why she was so anemic.  I answered all the patient's questions, and she expressed understanding and agreement with this plan.  Will forward to PCP as FYI.  Cyniah Gossard C. Shan Levans, MD PGY-3, [facility] 12/13/2018 6:35 AM

## 2018-12-13 NOTE — Progress Notes (Signed)
Echocardiogram 2D Echocardiogram has been performed.  Oneal Deputy Delinda Malan 12/13/2018, 1:51 PM

## 2018-12-13 NOTE — Assessment & Plan Note (Addendum)
Patient reporting pain in both of her legs and her lower back only when she is walking for ~2weeks. ABIs in office WNL. She denies any trauma.  No red flags on exam.  No numbness or tingling associated.  She has not tried any medication for the pain.  Instructed her to try heating pads and stretches and will also give her a muscle relaxer to trial.

## 2018-12-14 ENCOUNTER — Encounter (HOSPITAL_COMMUNITY)
Admission: EM | Disposition: A | Discharge status: Home / Self Care | Payer: Self-pay | Source: Home / Self Care | Attending: Family Medicine

## 2018-12-14 ENCOUNTER — Inpatient Hospital Stay (HOSPITAL_COMMUNITY): Payer: Medicare HMO | Admitting: Certified Registered Nurse Anesthetist

## 2018-12-14 ENCOUNTER — Telehealth: Payer: Self-pay | Admitting: Hematology and Oncology

## 2018-12-14 ENCOUNTER — Encounter (HOSPITAL_COMMUNITY): Payer: Self-pay | Admitting: Certified Registered Nurse Anesthetist

## 2018-12-14 DIAGNOSIS — Z720 Tobacco use: Secondary | ICD-10-CM

## 2018-12-14 DIAGNOSIS — K31819 Angiodysplasia of stomach and duodenum without bleeding: Secondary | ICD-10-CM

## 2018-12-14 HISTORY — PX: ESOPHAGOGASTRODUODENOSCOPY (EGD) WITH PROPOFOL: SHX5813

## 2018-12-14 HISTORY — PX: HEMOSTASIS CONTROL: SHX6838

## 2018-12-14 LAB — CBC
HCT: 28.2 % — ABNORMAL LOW (ref 36.0–46.0)
Hemoglobin: 9 g/dL — ABNORMAL LOW (ref 12.0–15.0)
MCH: 28.2 pg (ref 26.0–34.0)
MCHC: 31.9 g/dL (ref 30.0–36.0)
MCV: 88.4 fL (ref 80.0–100.0)
Platelets: 261 10*3/uL (ref 150–400)
RBC: 3.19 MIL/uL — ABNORMAL LOW (ref 3.87–5.11)
RDW: 14.9 % (ref 11.5–15.5)
WBC: 11.1 10*3/uL — ABNORMAL HIGH (ref 4.0–10.5)
nRBC: 0 % (ref 0.0–0.2)

## 2018-12-14 LAB — BASIC METABOLIC PANEL
Anion gap: 8 (ref 5–15)
BUN: 19 mg/dL (ref 8–23)
CO2: 17 mmol/L — ABNORMAL LOW (ref 22–32)
Calcium: 9.2 mg/dL (ref 8.9–10.3)
Chloride: 116 mmol/L — ABNORMAL HIGH (ref 98–111)
Creatinine, Ser: 2.95 mg/dL — ABNORMAL HIGH (ref 0.44–1.00)
GFR calc Af Amer: 18 mL/min — ABNORMAL LOW (ref >60)
GFR calc non Af Amer: 16 mL/min — ABNORMAL LOW (ref >60)
Glucose, Bld: 146 mg/dL — ABNORMAL HIGH (ref 70–99)
Potassium: 3.2 mmol/L — ABNORMAL LOW (ref 3.5–5.1)
Sodium: 141 mmol/L (ref 135–145)

## 2018-12-14 LAB — MAGNESIUM: Magnesium: 2.2 mg/dL (ref 1.7–2.4)

## 2018-12-14 LAB — GLUCOSE, CAPILLARY
Glucose-Capillary: 153 mg/dL — ABNORMAL HIGH (ref 70–99)
Glucose-Capillary: 142 mg/dL — ABNORMAL HIGH (ref 70–99)
Glucose-Capillary: 257 mg/dL — ABNORMAL HIGH (ref 70–99)

## 2018-12-14 SURGERY — ESOPHAGOGASTRODUODENOSCOPY (EGD) WITH PROPOFOL
Anesthesia: Monitor Anesthesia Care

## 2018-12-14 MED ORDER — PROPOFOL 500 MG/50ML IV EMUL
INTRAVENOUS | Status: DC | PRN
  Administered 2018-12-14: 100 ug/kg/min via INTRAVENOUS

## 2018-12-14 MED ORDER — INSULIN ASPART 100 UNIT/ML ~~LOC~~ SOLN
0.0000 [IU] | Freq: Every day | SUBCUTANEOUS | Status: DC
  Administered 2018-12-14: 3 [IU] via SUBCUTANEOUS

## 2018-12-14 MED ORDER — PROPOFOL 10 MG/ML IV BOLUS
INTRAVENOUS | Status: DC | PRN
  Administered 2018-12-14 (×3): 20 mg via INTRAVENOUS

## 2018-12-14 MED ORDER — FUROSEMIDE 10 MG/ML IJ SOLN
20.0000 mg | Freq: Once | INTRAMUSCULAR | Status: AC
  Administered 2018-12-14: 20 mg via INTRAVENOUS
  Filled 2018-12-14: qty 2

## 2018-12-14 MED ORDER — POTASSIUM CHLORIDE CRYS ER 20 MEQ PO TBCR
40.0000 meq | EXTENDED_RELEASE_TABLET | Freq: Once | ORAL | Status: AC
  Administered 2018-12-14: 40 meq via ORAL
  Filled 2018-12-14: qty 2

## 2018-12-14 MED ORDER — INSULIN ASPART 100 UNIT/ML ~~LOC~~ SOLN
0.0000 [IU] | Freq: Three times a day (TID) | SUBCUTANEOUS | Status: DC
  Administered 2018-12-15 (×2): 5 [IU] via SUBCUTANEOUS
  Administered 2018-12-15 – 2018-12-16 (×2): 3 [IU] via SUBCUTANEOUS
  Administered 2018-12-16: 5 [IU] via SUBCUTANEOUS

## 2018-12-14 MED ORDER — SODIUM CHLORIDE 0.9 % IV SOLN
510.0000 mg | Freq: Once | INTRAVENOUS | Status: AC
  Administered 2018-12-14: 510 mg via INTRAVENOUS
  Filled 2018-12-14: qty 17

## 2018-12-14 MED ORDER — SODIUM CHLORIDE 0.9 % IV SOLN
INTRAVENOUS | Status: DC | PRN
  Administered 2018-12-14: 15:00:00 via INTRAVENOUS

## 2018-12-14 SURGICAL SUPPLY — 15 items

## 2018-12-14 NOTE — Plan of Care (Signed)
  Problem: Education: Goal: Ability to identify signs and symptoms of gastrointestinal bleeding will improve Outcome: Progressing   

## 2018-12-14 NOTE — Progress Notes (Addendum)
     [facility] Progress Note  CC:  Anemia  Subjective:  Hgb 9.0 grams this AM.  Feels fine.  Wants something to eat.  Objective:  Vital signs in last 24 hours: Temp:  [97.5 F (36.4 C)-98.5 F (36.9 C)] 98.3 F (36.8 C) (11/11 0905) Pulse Rate:  [56-65] 57 (11/11 0905) Resp:  [13-20] 18 (11/11 0905) BP: (137-209)/(70-95) 185/71 (11/11 0905) SpO2:  [94 %-100 %] 99 % (11/11 0905) Weight:  [65 kg] 65 kg (11/11 0402) Last BM Date: 12/14/18 General:  Alert, Well-developed, in NAD Heart:  Regular rate and rhythm; no murmurs Pulm:  CTAB.  No increased WOB. Abdomen:  Soft, non-distended.  BS present.  Non-tender.  Extremities:  Without edema. Neurologic:  Alert and oriented x 4;  grossly normal neurologically. Psych:  Alert and cooperative.  Normal mood and affect.  Intake/Output from previous day: 11/10 0701 - 11/11 0700 In: 1640 [P.O.:60; Blood:1580] Out: 0  Intake/Output this shift: Total I/O In: 0  Out: 900 [Urine:900]  Lab Results: Recent Labs    12/12/18 1058  12/13/18 0740 12/13/18 1455 12/13/18 2146 12/14/18 0418  WBC 10.7  --  8.5  --   --  11.1*  HGB 5.1*   < > 4.7* 7.4* 9.7* 9.0*  HCT 16.5*   < > 15.4* 23.5* 29.8* 28.2*  PLT 339  --  313  --   --  261   < > = values in this interval not displayed.   BMET Recent Labs    12/12/18 1058 12/13/18 0740 12/14/18 0418  NA 142 139 141  K 3.7 3.2* 3.2*  CL 113* 115* 116*  CO2 15* 16* 17*  GLUCOSE 229* 183* 146*  BUN 26 23 19   CREATININE 3.02* 3.20* 2.95*  CALCIUM 9.6 9.3 9.2   LFT Recent Labs    12/13/18 1455  PROT 5.5*  ALBUMIN 3.2*  AST 11*  ALT 10  ALKPHOS 107  BILITOT 0.4  BILIDIR <0.1  IBILI NOT CALCULATED   PT/INR Recent Labs    12/13/18 1455  LABPROT 13.7  INR 1.1   Dg Chest Portable 1 View  Result Date: 12/13/2018 CLINICAL DATA:  Weakness, body aches EXAM: PORTABLE CHEST 1 VIEW COMPARISON:  None. FINDINGS: Lungs are clear. No pleural effusion. The  cardiomediastinal silhouette is within limits with normal size. No acute osseous abnormality. IMPRESSION: No acute process in the chest. Electronically Signed   By: Macy Mis M.D.   On: 12/13/2018 10:00   Assessment / Plan: 1.  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 pylori with duodenal AVMs versus other. 2.  CHF 3.  AKI: Creatinine 2.95  -For EGD today. -Continue pantoprazole 40 mg IV BID for now. -Monitor Hgb and transfuse further prn.   LOS: 1 day   Laban Emperor. Zehr  12/14/2018, 1:01 PM  GI ATTENDING  As above.  Endoscopy completed.  Please see report.  Please see final recommendations on endoscopy report.  Thank you  Docia Chuck. Geri Seminole., M.D. [facility] Division of Gastroenterology

## 2018-12-14 NOTE — Progress Notes (Addendum)
Family Medicine Teaching Service Daily Progress Note Intern Pager: [phone]  Patient name: Sara Davis Medical record number: [id number] Date of birth: 06/06/1950 Age: 68 y.o. Gender: female  Primary Care Provider: [name], [name], DO Consultants: GI Code Status: FULL  Pt Overview and Major Events to Date:  12/13/18: Admitted and GI consult  12/14/2018: EGD multiple AVMs identified   Assessment and Plan:  Sara Davis is a 68 y.o. female presenting with anemia and DOE . PMH is significant for HTN, h/o anemia, emphysema, HFpEF, T2DM, h/o gastric ulcer 2/2 H/ pylori  Dyspnea on exertion secondary to symptomatic anemia Two weeks of worsening DOE. Had a nosebleed that lasted 20 minutes a couple days PTA.  Hgb 9.0 this morning from 4.7 on admission s/p 3 pRBCs.  She denies NSAID/BC powder use.  She continues to deny melena and hematochezia.  Does not endorse abdominal pain, shortness of breath, dizziness.  BUN 19 not likely upper GI bleed. Fe studies yielded evidence of iron deficiency anemia however anemia of chronic disease complicates since anemia.  Folate within normal limits.  B12 266, repleting B12 orally.  Patient's FOBT was positive in the ED.  Patient scheduled to have a EGD today.  Reached out to hematology for optimization of patient's hemoglobin.  She has a scheduled follow-up with hematology on 12/23/18.  [facility] for outpatient following up who will set up patient's appointment.  Patient was treated for H. pylori however did not follow-up with GI in December 2019.  - GI following, appreciate recommendations  - S/P 3 units pRBCs - Trend Hgb   - Protonix IV 40 mg twice daily -Vitals per routine - Transfusion threshold < 7  - resume Fe at discharge    HFpEF  Pulmonary hypertension CXR showed no acute cardiopulmonary processes. Additionally, patient does not appear volume overloaded on exam. ECHO on 12/13/18 EF 60-65% with moderately LVH, Grade 2 Diastolic  dysfunction, RV systolic pressure moderately elevated at 47.3 mmHg.  IVC was dilated on this echo.  ECHO 01/06/2018 indicated severely increased systolic pressure with PA peak pressure of 75 mmHg.  During Dec 2019 admission on 01/2018 patient was to have repeat ECHO in 3 months, however patient was lost to follow-up. Home medications include Coreg 25 mg BID and Lasix 40 mg daily.  As patient is n.p.o. we will give patient 20 mg IV Lasix today. - Consider repeating ECHO  - Consider Cardiology [facility]) consult   - Coreg 25 mg BID  - Will discuss obtaining labs previously recommended by Dr. Virgina Jock: If pulmonary hypertension persists, may need further workup with PFT,V/Q scan, HRCT, right heart catheterization. Consider ESR, CRP, ANA, HIV - Strict Is/Os  - Daily Weights   AKI on CKD stage IV Cr 2.95 today from 3.2 yesterday. Baseline ~ 2.0. Patient was to follow up with [place] however was lost to follow up. Previous renal US in 2018 was grossly normal.  - Avoid nephrotoxic agents  - Monitor Cr with BMP  - Strict Is/Os  - Discontinue Metformin at discharge   T2DM Fasting glucose 153, At home on Novolin 70/30 20 unitsBIDand metformin 1000 mg BID. Last A1C 7.8 on 11/9//20.  - Start SSI when patient is no longer NPO  - Discontinue metformin at discharge.    Hypertension Normotensive this morning. BP range 137/83 - 209/95 and most recently 137/83. Home medications include Coreg 12.5 mg BID.  - Increased Coreg 25 mg BID  - Added Amlodipine 5 mg  -Monitor  blood pressures   Hyperlipidemia At home on Lipitor 20 mg daily.  -continue home lipitor  Tobacco use disorder Smoked 1 pack/day since 68 years old, has since decreased to half pack per day.  Interested in nicotine patch while admitted.  -Nicotine patch, 14 mg -Encourage smoking cessation    FEN/GI: NPO, replete electrolytes as needed  PPx: SCDs   Disposition: home, pending medical stabilization   Subjective:   Genevie Ann received additional PRBCs overnight.  Patient without complaints this morning.  Objective: Temp:  [97.5 F (36.4 C)-98.5 F (36.9 C)] 98.3 F (36.8 C) (11/11 0402) Pulse Rate:  [56-76] 56 (11/11 0402) Resp:  [13-20] 16 (11/11 0402) BP: (137-209)/(68-95) 137/83 (11/11 0402) SpO2:  [94 %-100 %] 99 % (11/11 0402) Weight:  [65 kg] 65 kg (11/11 0402)  Physical Exam: General: Alert, oriented, lying in bed in no acute distress Cardiovascular: Regular rate and rhythm, no murmurs appreciated, distal pulses intact Respiratory: Clear to auscultation bilaterally, no increased work of breathing Abdomen: Soft nontender nondistended, bowel sounds present, no rebound, no guarding Extremities: No lower extremity edema, no limb ischemia appreciated   Laboratory: Recent Labs  Lab 12/12/18 1058  12/13/18 0740 12/13/18 1455 12/13/18 2146 12/14/18 0418  WBC 10.7  --  8.5  --   --  11.1*  HGB 5.1*   < > 4.7* 7.4* 9.7* 9.0*  HCT 16.5*   < > 15.4* 23.5* 29.8* 28.2*  PLT 339  --  313  --   --  261   < > = values in this interval not displayed.   Recent Labs  Lab 12/12/18 1058 12/13/18 0740 12/13/18 1455 12/14/18 0418  NA 142 139  --  141  K 3.7 3.2*  --  3.2*  CL 113* 115*  --  116*  CO2 15* 16*  --  17*  BUN 26 23  --  19  CREATININE 3.02* 3.20*  --  2.95*  CALCIUM 9.6 9.3  --  9.2  PROT  --   --  5.5*  --   BILITOT  --   --  0.4  --   ALKPHOS  --   --  107  --   ALT  --   --  10  --   AST  --   --  11*  --   GLUCOSE 229* 183*  --  146*     Imaging/Diagnostic Tests: Dg Chest Portable 1 View  Result Date: 12/13/2018 CLINICAL DATA:  Weakness, body aches EXAM: PORTABLE CHEST 1 VIEW COMPARISON:  None. FINDINGS: Lungs are clear. No pleural effusion. The cardiomediastinal silhouette is within limits with normal size. No acute osseous abnormality. IMPRESSION: No acute process in the chest. Electronically Signed   By: Macy Mis M.D.   On: 12/13/2018 10:00      Lyndee Hensen, MD 12/14/2018, 7:24 AM PGY-1, [facility] Intern pager: [phone], text pages welcome

## 2018-12-14 NOTE — Transfer of Care (Signed)
Immediate Anesthesia Transfer of Care Note  Patient: Sara Davis  Procedure(s) Performed: ESOPHAGOGASTRODUODENOSCOPY (EGD) WITH PROPOFOL (N/A ) HEMOSTASIS CONTROL  Patient Location: PACU  Anesthesia Type:MAC  Level of Consciousness: awake and drowsy  Airway & Oxygen Therapy: Patient Spontanous Breathing and Patient connected to nasal cannula oxygen  Post-op Assessment: Report given to RN and Post -op Vital signs reviewed and stable  Post vital signs: Reviewed and stable  Last Vitals:  Vitals Value Taken Time  BP 102/39 12/14/18 1544  Temp 36.9 C 12/14/18 1544  Pulse 55 12/14/18 1546  Resp 14 12/14/18 1546  SpO2 98 % 12/14/18 1546  Vitals shown include unvalidated device data.  Last Pain:  Vitals:   12/14/18 1544  TempSrc: Axillary  PainSc: Asleep         Complications: No apparent anesthesia complications

## 2018-12-14 NOTE — Anesthesia Preprocedure Evaluation (Signed)
Anesthesia Evaluation  Patient identified by MRN, date of birth, ID band Patient awake    Reviewed: Allergy & Precautions, NPO status , Patient's Chart, lab work & pertinent test results  Airway Mallampati: II  TM Distance: >3 FB     Dental   Pulmonary Current Smoker and Patient abstained from smoking.,    breath sounds clear to auscultation       Cardiovascular hypertension, +CHF   Rhythm:Regular Rate:Normal     Neuro/Psych  Headaches, PSYCHIATRIC DISORDERS Depression    GI/Hepatic Neg liver ROS, PUD,   Endo/Other  diabetes  Renal/GU negative Renal ROS     Musculoskeletal   Abdominal   Peds  Hematology  (+) anemia ,   Anesthesia Other Findings   Reproductive/Obstetrics                             Anesthesia Physical Anesthesia Plan  ASA: III  Anesthesia Plan: MAC   Post-op Pain Management:    Induction: Intravenous  PONV Risk Score and Plan: 1 and Propofol infusion  Airway Management Planned: Nasal Cannula, Simple Face Mask and Mask  Additional Equipment:   Intra-op Plan:   Post-operative Plan:   Informed Consent: I have reviewed the patients History and Physical, chart, labs and discussed the procedure including the risks, benefits and alternatives for the proposed anesthesia with the patient or authorized representative who has indicated his/her understanding and acceptance.     Dental advisory given  Plan Discussed with: CRNA and Anesthesiologist  Anesthesia Plan Comments:         Anesthesia Quick Evaluation

## 2018-12-14 NOTE — Progress Notes (Signed)
Initial Nutrition Assessment  DOCUMENTATION CODES:   Severe malnutrition in context of chronic illness  INTERVENTION:   Once diet advanced:   Boost Breeze po TID, each supplement provides 250 kcal and 9 grams of protein  MVI daily   NUTRITION DIAGNOSIS:   Severe Malnutrition related to chronic illness(CHF) as evidenced by severe fat depletion, severe muscle depletion, energy intake < or equal to 75% for > or equal to 1 month.  GOAL:   Patient will meet greater than or equal to 90% of their needs  MONITOR:   PO intake, Supplement acceptance, Diet advancement, Weight trends, Labs, I & O's  REASON FOR ASSESSMENT:   Malnutrition Screening Tool    ASSESSMENT:   Patient with PMH significant for CHF, DM, HTN, and gastric ulcer. Presents this admission acute on chronic anemia.  Plan for EGD this afternoon.   Pt endorses having loss of appetite over the last 3-4 weeks for unknown reasons. States she typically eats two meals daily that consist of L- deli sandwich or grilled cheese D- meat, vegetable, grain. During this time period she could only tolerate 25-50% of each meal. She does not like Ensure. Willing to try [employer] once diet advanced.   Pt endorses a UBW of 170 lb and an unknown amount of unintentional weight loss. Records indicate pt weighed 142 lb on 1/22 and show two weights this admission (90 lb and 143 lb). Will need to obtain actual weight recording to assess for weight loss.   Medications: reviewed  Labs: K 3.2 (L) CBG 147-153   NUTRITION - FOCUSED PHYSICAL EXAM:    Most Recent Value  Orbital Region  Moderate depletion  Upper Arm Region  Severe depletion  Thoracic and Lumbar Region  Unable to assess  Buccal Region  Severe depletion  Temple Region  Moderate depletion  Clavicle Bone Region  Severe depletion  Clavicle and Acromion Bone Region  Severe depletion  Scapular Bone Region  Unable to assess  Dorsal Hand  Severe depletion  Patellar Region   Severe depletion  Anterior Thigh Region  Severe depletion  Posterior Calf Region  Severe depletion  Edema (RD Assessment)  None  Hair  Reviewed  Eyes  Reviewed  Mouth  Reviewed  Skin  Reviewed  Nails  Reviewed     Diet Order:   Diet Order            Diet NPO time specified  Diet effective midnight              EDUCATION NEEDS:   Education needs have been addressed  Skin:  Skin Assessment: Reviewed RN Assessment  Last BM:  11/11  Height:   Ht Readings from Last 1 Encounters:  12/13/18 5\' 6"  (1.676 m)    Weight:   Wt Readings from Last 1 Encounters:  12/14/18 65 kg    Ideal Body Weight:  59.1 kg  BMI:  Body mass index is 23.11 kg/m.  Estimated Nutritional Needs:   Kcal:  1800-2000 kcal  Protein:  90-105 grams  Fluid:  >/= 1.8 L.day  Mariana Single RD, LDN Clinical Nutrition Pager # [phone]

## 2018-12-14 NOTE — Plan of Care (Signed)
  Problem: Fluid Volume: Goal: Will show no signs and symptoms of excessive bleeding Outcome: Progressing   

## 2018-12-14 NOTE — Discharge Summary (Addendum)
[facility] Discharge Summary  Patient name: Sara Davis Medical record number: [id number] Date of birth: December 02, 1950 Age: 68 y.o. Gender: female Date of Admission: 12/13/2018  Date of Discharge: 12/16/18  Admitting Physician: Martyn Malay, MD  Primary Care Provider: [name], [name], DO Consultants: Gastroenterology  Indication for Hospitalization: Symptomatic anemia  Discharge Diagnoses/Problem List:  Symptomatic anemia Heart failure with preserved ejection fraction Pulmonary hypertension Acute on chronic kidney disease stage IV Type 2 diabetes Hypertension Hyperlipidemia Tobacco abuse disorder   Disposition: Home  Discharge Condition: Improved and stable  Discharge Exam:  GEN: Well-developed female no acute distress CV: regular rate and rhythm, no murmurs appreciated  RESP: no increased work of breathing, clear to ascultation bilaterally  ABD: Bowel sounds present. Soft, Nontender, Nondistended.  MSK: no lower extremity edema, or cyanosis noted SKIN: warm, dry NEURO: grossly normal, moves all extremities appropriately PSYCH: Normal affect and thought content   Brief Hospital Course:   Sara Davis is a 68 y.o. female who presented to the ED for worsening dyspnea on exertion after being seen in the clinic the day prior to arrival.  Hemoglobin 5.1 in clinic and on arrival to the ED hemoglobin 4.7.  Patient received 3 units pRBCs. FOBT performed in the ED was positive.  Patient denies melena and hematochezia. GI was consulted and performed an EGD on 12/14/2018 that showed mild esophagitis and multiple duodenal AVMs.  AVMs were treated endoscopically with hemostatic therapy.  Etiology of anemia likely multifactorial; iron studies indicated iron deficiency anemia, low vitamin B12 and anemia of chronic disease.  Patient has outpatient follow-up with hematology on 12/23/18 to maximize hemoglobin.  Patient was found to have significant AKI during  admission (Cr 3.6) and down trended to 2.92 on the day of discharge.  [facility] and they will schedule outpatient follow-up for patient.  Hemoglobin remained stable. She received 500 mg Feraheme on 12/14/2018.  Patient is appropriate for discharge at this time with close follow-up with her PCP.   Issues for Follow Up:  1. Per GI: Patient should take iron 325 mg twice daily indefinitely. 2. Patient has stage IV chronic kidney disease.  We discontinued patient's Metformin in the setting of her renal disease.  Patient is to follow-up with [facility].  3. Patient will need BMP and CBC to assess for normalization of creatine and monitoring of hemoglobin at hospital follow-up. 4. New medication: Amlodipine 10 mg.  Increased Coreg to 25 mg twice daily.  Please see to it that these medication changes are documented in her medical record. 5. Patient has scheduled follow-up with hematology, Dr. Lorenso Courier, on 1120 12/23/18 at 10 AM.  Please remind patient of the importance of attending this appointment. 6. Recommend outpatient referral to cardiology regarding findings of pulmonary hypertension seen on echocardiogram.  Significant Procedures: EGD  Significant Labs and Imaging:  Recent Labs  Lab 12/13/18 0740  12/13/18 2146 12/14/18 0418 12/15/18 0456  WBC 8.5  --   --  11.1* 8.8  HGB 4.7*   < > 9.7* 9.0* 8.9*  HCT 15.4*   < > 29.8* 28.2* 27.8*  PLT 313  --   --  261 252   < > = values in this interval not displayed.   Recent Labs  Lab 12/12/18 1058 12/13/18 0740 12/13/18 1455 12/14/18 0418 12/15/18 0456 12/15/18 1629 12/16/18 0531  NA 142 139  --  141 140  --  141  K 3.7 3.2*  --  3.2*  3.5  --  3.4*  CL 113* 115*  --  116* 114*  --  114*  CO2 15* 16*  --  17* 16*  --  17*  GLUCOSE 229* 183*  --  146* 241*  --  219*  BUN 26 23  --  19 24*  --  28*  CREATININE 3.02* 3.20*  --  2.95* 3.65* 3.32* 2.92*  CALCIUM 9.6 9.3  --  9.2 8.8*  --  8.8*  MG  --   --   --  2.2   --   --   --   PHOS  --   --   --   --  3.4  --  3.1  ALKPHOS  --   --  107  --   --   --   --   AST  --   --  11*  --   --   --   --   ALT  --   --  10  --   --   --   --   ALBUMIN  --   --  3.2*  --  2.7*  --  2.5*    ECHO 12/13/2018:  IMPRESSIONS  1. Left ventricular ejection fraction, by visual estimation, is 60 to 65%. The left ventricle has normal function. There is moderately increased left ventricular hypertrophy.  2. Left ventricular diastolic parameters are consistent with Grade II diastolic dysfunction (pseudonormalization).  3. The left ventricle has no regional wall motion abnormalities.  4. Global right ventricle has normal systolic function.The right ventricular size is normal. No increase in right ventricular wall thickness.  5. Left atrial size was mildly dilated.  6. Right atrial size was normal.  7. The mitral valve is normal in structure. No evidence of mitral valve regurgitation. No evidence of mitral stenosis.  8. The tricuspid valve is normal in structure. Tricuspid valve regurgitation is trivial.  9. The aortic valve is tricuspid. Aortic valve regurgitation is trivial. No evidence of aortic valve sclerosis or stenosis. 10. The tricuspid regurgitant velocity is 2.84 m/s, and with an assumed right atrial pressure of 15 mmHg, the estimated right ventricular systolic pressure is moderately elevated at 47.3 mmHg. 11. The inferior vena cava is dilated in size with <50% respiratory variability, suggesting right atrial pressure of 15 mmHg.   EGD 12/14/2018: Impression:                1. Mild esophagitis                 2. 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 This is likely cause for iron  deficiency anemia.   Results/Tests Pending at Time of Discharge: None  Discharge Medications:  Allergies as of 12/16/2018      Reactions   Aspirin Other (See Comments)   Upset stomach, can only take coated aspirin but prefers to not take it at all       Medication List    STOP taking these medications   furosemide 40 MG tablet Commonly known as: LASIX   metFORMIN 1000 MG tablet Commonly known as: GLUCOPHAGE     TAKE these medications   Accu-Chek Aviva Soln 1 Units by In Vitro route 3 (three) times daily as needed.   acetaminophen 500 MG tablet Commonly known as: TYLENOL Take 1,000 mg by mouth every 6 (six) hours as needed for mild pain or fever.   amLODipine 10 MG tablet Commonly known as: NORVASC Take 1  tablet (10 mg total) by mouth daily. What changed:   medication strength  how much to take   atorvastatin 80 MG tablet Commonly known as: LIPITOR Take 80 mg by mouth daily.   B-D SINGLE USE SWABS REGULAR Pads 1 Units by Does not apply route 3 (three) times daily as needed.   B-D ULTRAFINE III SHORT PEN 31G X 8 MM Misc Generic drug: Insulin Pen Needle CHECK BLOOD SUGARS ONCE IN MORNING BEFORE FOOD AND ONCE IN THE EVENING.   carvedilol 25 MG tablet Commonly known as: COREG Take 1 tablet (25 mg total) by mouth 2 (two) times daily.   CONTOUR BLOOD GLUCOSE SYSTEM Devi Use to test blood sugars once in the morning before food and once in the evening.DX: 250.02   Accu-Chek Aviva device Use as instructed   ferrous sulfate 325 (65 FE) MG tablet Take 1 tablet (325 mg total) by mouth every other day.   glucose blood test strip Commonly known as: ONE TOUCH TEST STRIPS Please check blood sugar once daily in AM before eating and once daily in PM   Accu-Chek Aviva Plus test strip Generic drug: glucose blood Use as instructed   insulin NPH-regular Human (70-30) 100 UNIT/ML injection Inject 22 Units into the skin 2 (two) times daily. What changed: how much to take   onetouch ultrasoft lancets Please check blood sugar once daily in AM before eating and once daily in PM   accu-chek soft touch lancets Use as instructed   pantoprazole 40 MG tablet Commonly known as: PROTONIX Take 1 tablet (40 mg total) by mouth  2 (two) times daily. Take for H. Pylori treatment and then continue once per day, as before.       Discharge Instructions: Please refer to Patient Instructions section of EMR for full details.  Patient was counseled important signs and symptoms that should prompt return to medical care, changes in medications, dietary instructions, activity restrictions, and follow up appointments.   Follow-Up Appointments: Follow-up Information    Orson Slick, MD Follow up on 12/23/2018.   Specialty: Hematology and Oncology Why: 12/23/18 at 11 AM with Dr. Lorenso Courier with Hematology  Contact information: [place] [address] [phone]        [place], [place]. Go to.   Contact information: [place] [id number] [place]. Go on 12/19/2018.   Specialty: Family Medicine Why: Blood work appoint on Monday 12/19/18 at 10 AM  and lab results discusion and hospital follow up with Dr. Maudie Mercury on Tuesday 12/20/18 at 10 AM   Contact information: [address] [id number] [place]          Lyndee Hensen, MD 12/16/2018, 5:40 PM PGY-1, [place] Upper-Level Resident Addendum   I have independently interviewed and examined the patient. I have discussed the above with the original author and agree with their documentation. My edits for correction/addition/clarification are in blue. Please see also any attending notes.    Milus Banister, DO PGY-2, [facility] 12/18/2018 5:40 PM  FPTS Service pager: [phone] (text pages welcome through [place])

## 2018-12-14 NOTE — Anesthesia Procedure Notes (Signed)
Procedure Name: MAC Date/Time: 12/14/2018 3:15 PM Performed by: Inda Coke, CRNA Pre-anesthesia Checklist: Patient identified, Emergency Drugs available, Suction available, Timeout performed and Patient being monitored Patient Re-evaluated:Patient Re-evaluated prior to induction Oxygen Delivery Method: Nasal cannula Induction Type: IV induction Dental Injury: Teeth and Oropharynx as per pre-operative assessment

## 2018-12-14 NOTE — Op Note (Signed)
[facility] Patient Name: Sara Davis Procedure Date : 12/14/2018 MRN: [id number] Attending MD: Docia Chuck. Henrene Pastor , MD Date of Birth: 11-Sep-1950 CSN: [id number] Age: 68 Admit Type: Inpatient Procedure:                Upper GI endoscopy with hemostatic therapy Indications:              Iron deficiency anemia Providers:                Docia Chuck. Henrene Pastor, MD, Baird Cancer, RN, Lazaro Arms,                            Technician Referring MD:             [facility] hospitalist Medicines:                Monitored Anesthesia Care Complications:            No immediate complications. Estimated Blood Loss:     Estimated blood loss: none. Procedure:                Pre-Anesthesia Assessment:                           - Prior to the procedure, a History and Physical                            was performed, and patient medications and                            allergies were reviewed. The patient's tolerance of                            previous anesthesia was also reviewed. The risks                            and benefits of the procedure and the sedation                            options and risks were discussed with the patient.                            All questions were answered, and informed consent                            was obtained. Prior Anticoagulants: The patient has                            taken no previous anticoagulant or antiplatelet                            agents. ASA Grade Assessment: II - A patient with                            mild systemic disease. After reviewing the risks  and benefits, the patient was deemed in                            satisfactory condition to undergo the procedure.                           After obtaining informed consent, the endoscope was                            passed under direct vision. Throughout the                            procedure, the patient's blood pressure, pulse, and           oxygen saturations were monitored continuously. The                            GIF-H190 ([id number] Olympus gastroscope was                            introduced through the mouth, and advanced to the                            second part of duodenum. The upper GI endoscopy was                            accomplished without difficulty. The patient                            tolerated the procedure well. Scope In: Scope Out: Findings:      The esophagus revealed mild esophagitis at the mucosal Z-line. Esophagus       was otherwise normal.      The stomach was normal.      Small hiatal hernia noted.      The cardia and gastric fundus were normal on retroflexion.      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 Coagulation for bleeding       prevention using argon beam was successful. Approximately 6 lesions were       identified and successfully treated. Impression:               1. Mild esophagitis                           2. Multiple duodenal AVMs status post endoscopic                            hemostatic therapy. This is likely cause for iron                            deficiency anemia. Recommendation:           1. Please please arrange IV iron infusion therapy                            prior  to discharge                           2. Please discharge home on iron sulfate 325 mg                            twice daily INDEFINITELY. Iron deficiency anemia is                            likely recur if the patient is off iron therapy                           3. Diet of choice. Patient should be ready for                            discharge in a.m. GI will sign off. Thanks. Procedure Code(s):        --- Professional ---                           [phone], Esophagogastroduodenoscopy, flexible,                            transoral; with control of bleeding, any method Diagnosis Code(s):        --- Professional  ---                           J03.009, Angiodysplasia of stomach and duodenum                            without bleeding                           D50.9, Iron deficiency anemia, unspecified CPT copyright 2019 [employer] Medical Association. All rights reserved. The codes documented in this report are preliminary and upon coder review may  be revised to meet current compliance requirements. Docia Chuck. Henrene Pastor, MD 12/14/2018 3:43:47 PM This report has been signed electronically. Number of Addenda: 0

## 2018-12-14 NOTE — Telephone Encounter (Signed)
I received a call from Dr. Milus Banister to schedule a hem appt for anemia. Pt has been scheduled to see Dr. Lorenso Courier on 11/20 at 10am. I informed Dr. Ouida Sills that Sara Davis should arrive 15 minutes early.

## 2018-12-15 DIAGNOSIS — D500 Iron deficiency anemia secondary to blood loss (chronic): Secondary | ICD-10-CM

## 2018-12-15 LAB — CBC
HCT: 27.8 % — ABNORMAL LOW (ref 36.0–46.0)
Hemoglobin: 8.9 g/dL — ABNORMAL LOW (ref 12.0–15.0)
MCH: 28.3 pg (ref 26.0–34.0)
MCHC: 32 g/dL (ref 30.0–36.0)
MCV: 88.3 fL (ref 80.0–100.0)
Platelets: 252 10*3/uL (ref 150–400)
RBC: 3.15 MIL/uL — ABNORMAL LOW (ref 3.87–5.11)
RDW: 15.3 % (ref 11.5–15.5)
WBC: 8.8 10*3/uL (ref 4.0–10.5)
nRBC: 0 % (ref 0.0–0.2)

## 2018-12-15 LAB — GLUCOSE, CAPILLARY
Glucose-Capillary: 225 mg/dL — ABNORMAL HIGH (ref 70–99)
Glucose-Capillary: 185 mg/dL — ABNORMAL HIGH (ref 70–99)
Glucose-Capillary: 231 mg/dL — ABNORMAL HIGH (ref 70–99)
Glucose-Capillary: 129 mg/dL — ABNORMAL HIGH (ref 70–99)

## 2018-12-15 LAB — RENAL FUNCTION PANEL
Albumin: 2.7 g/dL — ABNORMAL LOW (ref 3.5–5.0)
Anion gap: 10 (ref 5–15)
BUN: 24 mg/dL — ABNORMAL HIGH (ref 8–23)
CO2: 16 mmol/L — ABNORMAL LOW (ref 22–32)
Calcium: 8.8 mg/dL — ABNORMAL LOW (ref 8.9–10.3)
Chloride: 114 mmol/L — ABNORMAL HIGH (ref 98–111)
Creatinine, Ser: 3.65 mg/dL — ABNORMAL HIGH (ref 0.44–1.00)
GFR calc Af Amer: 14 mL/min — ABNORMAL LOW (ref >60)
GFR calc non Af Amer: 12 mL/min — ABNORMAL LOW (ref >60)
Glucose, Bld: 241 mg/dL — ABNORMAL HIGH (ref 70–99)
Phosphorus: 3.4 mg/dL (ref 2.5–4.6)
Potassium: 3.5 mmol/L (ref 3.5–5.1)
Sodium: 140 mmol/L (ref 135–145)

## 2018-12-15 LAB — CREATININE, SERUM
Creatinine, Ser: 3.32 mg/dL — ABNORMAL HIGH (ref 0.44–1.00)
GFR calc Af Amer: 16 mL/min — ABNORMAL LOW (ref >60)
GFR calc non Af Amer: 14 mL/min — ABNORMAL LOW (ref >60)

## 2018-12-15 MED ORDER — HYDRALAZINE HCL 25 MG PO TABS
25.0000 mg | ORAL_TABLET | Freq: Once | ORAL | Status: AC
  Administered 2018-12-15: 25 mg via ORAL
  Filled 2018-12-15: qty 1

## 2018-12-15 MED ORDER — SODIUM CHLORIDE 0.9 % IV BOLUS
500.0000 mL | Freq: Once | INTRAVENOUS | Status: AC
  Administered 2018-12-15: 500 mL via INTRAVENOUS

## 2018-12-15 MED ORDER — AMLODIPINE BESYLATE 10 MG PO TABS
10.0000 mg | ORAL_TABLET | Freq: Every day | ORAL | Status: DC
  Administered 2018-12-16: 10 mg via ORAL
  Filled 2018-12-15: qty 1

## 2018-12-15 MED ORDER — PANTOPRAZOLE SODIUM 40 MG PO TBEC
40.0000 mg | DELAYED_RELEASE_TABLET | Freq: Every day | ORAL | Status: DC
  Administered 2018-12-15 – 2018-12-16 (×2): 40 mg via ORAL
  Filled 2018-12-15 (×2): qty 1

## 2018-12-15 MED ORDER — AMLODIPINE BESYLATE 5 MG PO TABS
5.0000 mg | ORAL_TABLET | Freq: Once | ORAL | Status: AC
  Administered 2018-12-15: 5 mg via ORAL
  Filled 2018-12-15: qty 1

## 2018-12-15 NOTE — Progress Notes (Signed)
Family Medicine Teaching Service Daily Progress Note Intern Pager: [phone]  Patient name: Sara Davis Medical record number: [id number] Date of birth: Mar 14, 1950 Age: 68 y.o. Gender: female  Primary Care Provider: [name], [name], DO Consultants: GI Code Status: FULL  Pt Overview and Major Events to Date:  12/13/18: Admitted and GI consult  12/14/2018: EGD multiple AVMs identified   Assessment and Plan:  Sara Davis is a 68 y.o. female presenting with anemia and DOE . PMH is significant for HTN, h/o anemia, emphysema, HFpEF, T2DM, h/o gastric ulcer 2/2 H/ pylori  Dyspnea on exertion secondary to symptomatic anemia Patient with no complaints this morning.  Hemoglobin stable 8.9 and was 9.0 yesterday.  Patient had EGD yesterday that showed multiple AVMs that were treated with hemostatically.  Per GI patient should be on 325 mg iron twice daily indefinitely.  AVMs are the likely cause of patient's iron deficiency anemia.  Patient received Feraheme yesterday.  Will restart oral PPI as patient has history of H. pylori.  Patient has outpatient follow-up with hematology on 12/23/2018 to maximize hemoglobin.  - GI signed off, appreciated recommendations  - S/P 3 units pRBCs - Trend Hgb   - Protonix po 40 mg - Vitals per routine - Transfusion threshold < 7  - Fe 325 BID at discharge     HFpEF  Pulmonary hypertension Denies shortness of breath continues to not appear volume overloaded on exam however echo had evidence of IVC dilation. ECHO on 12/13/18 EF 60-65% with moderately LVH, Grade 2 Diastolic dysfunction, RV systolic pressure moderately elevated at 47.3 mmHg.  Given 20 mg IV Lasix yesterday.  Home medications include Coreg 25 mg twice daily and Lasix 40 mg daily.  - Continue Coreg 25 mg BID  - Hold home Lasix - Strict Is/Os  - Daily Weights  - Cardiology [facility]) outpatient referral  AKI on CKD stage IV Patient with significant AKI.  Admission Cr 3.2 and today 3.65.   Encourage patient to significantly increase p.o. intake.  Patient does not want to stay in the hospital.  We will assess creatinine at 4 PM.  Baseline appears to be ~ 2.0.  Spoke with staff at [facility] who stated that patient no showed at her last appointment in 2018.  In order for the patient's appointment to be scheduled, Dr. Hollie Davis (nephrologist) will have to review pt's records and then call the patient to schedule an appointment.  - Avoid nephrotoxic agents  - Monitor Cr with BMP  - Strict Is/Os  - Discontinue Metformin at discharge  - Hold home Lasix  T2DM Fasting glucose 225.  Patient received 3 units sliding scale insulin.  Home medications include Novolin 70/30 20 unitsBIDand metformin 1000 mg BID. Last A1C 7.8 on 11/9//20.  - Start SSI when patient is no longer NPO  - Discontinue metformin at discharge.    Hypertension Patient is hypertensive today 166/66. Home medications include Coreg 12.5 mg BID.  - Increased Coreg 25 mg BID  - Continue amlodipine 5 mg, consider increasing to 10 mg - Monitor blood pressures   Hyperlipidemia At home on Lipitor 20 mg daily.  -continue home lipitor  Tobacco use disorder Smoked 1 pack/day since 68 years old, has since decreased to half pack per day.  Interested in nicotine patch while admitted.  -Nicotine patch, 14 mg -Encourage smoking cessation    FEN/GI: carb modified renal diet, replete electrolytes as needed  PPx: SCDs   Disposition: home, pending medical stabilization   Subjective:  Sara Davis  Sara Davis no significant overnight events.  Objective: Temp:  [97.7 F (36.5 C)-98.4 F (36.9 C)] 98.4 F (36.9 C) (11/12 0550) Pulse Rate:  [55-63] 61 (11/12 0550) Resp:  [14-26] 18 (11/12 0550) BP: (102-209)/(39-72) 166/66 (11/12 0550) SpO2:  [97 %-100 %] 97 % (11/12 0550)  Physical Exam:  GEN: Alert, no acute distress CV: Regular rate and rhythm, no murmurs appreciated RESP: no increased work of breathing, clear to  ascultation bilaterally  ABD: Bowel sounds present. Soft, Nontender, Nondistended.  No rebound, no guarding MSK: no lower extremity edema, or cyanosis noted SKIN: warm, dry NEURO: grossly normal, moves all extremities appropriately     Laboratory: Recent Labs  Lab 12/13/18 0740  12/13/18 2146 12/14/18 0418 12/15/18 0456  WBC 8.5  --   --  11.1* 8.8  HGB 4.7*   < > 9.7* 9.0* 8.9*  HCT 15.4*   < > 29.8* 28.2* 27.8*  PLT 313  --   --  261 252   < > = values in this interval not displayed.   Recent Labs  Lab 12/13/18 0740 12/13/18 1455 12/14/18 0418 12/15/18 0456  NA 139  --  141 140  K 3.2*  --  3.2* 3.5  CL 115*  --  116* 114*  CO2 16*  --  17* 16*  BUN 23  --  19 24*  CREATININE 3.20*  --  2.95* 3.65*  CALCIUM 9.3  --  9.2 8.8*  PROT  --  5.5*  --   --   BILITOT  --  0.4  --   --   ALKPHOS  --  107  --   --   ALT  --  10  --   --   AST  --  11*  --   --   GLUCOSE 183*  --  146* 241*     Imaging/Diagnostic Tests: No results found.   Sara Hensen, MD 12/15/2018, 7:35 AM PGY-1, [place] Intern pager: [phone], text pages welcome

## 2018-12-15 NOTE — Progress Notes (Signed)
Inpatient Diabetes Program Recommendations  AACE/ADA: New Consensus Statement on Inpatient Glycemic Control (2015)  Target Ranges:  Prepandial:   less than 140 mg/dL      Peak postprandial:   less than 180 mg/dL (1-2 hours)      Critically ill patients:  140 - 180 mg/dL   Lab Results  Component Value Date   GLUCAP 225 (H) 12/15/2018   HGBA1C 7.8 (A) 12/12/2018    Review of Glycemic Control Results for Sara Davis, Sara Davis (MRN [id number]) as of 12/15/2018 10:54  Ref. Range 12/14/2018 14:39 12/14/2018 20:56 12/15/2018 07:05  Glucose-Capillary Latest Ref Range: 70 - 99 mg/dL 142 (H) 257 (H) 225 (H)   Diabetes history: Type 2 DM Outpatient Diabetes medications: NPH 20 units BID, Metformin 1000 mg BID Current orders for Inpatient glycemic control: Novolog 0-15 units TID, Novolog 0-5 units QHS  Inpatient Diabetes Program Recommendations:    Consider Levemir 6 units BID. In agreement on discontinuing Metformin at discharge.   Thanks, Bronson Curb, MSN, RNC-OB Diabetes Coordinator [phone] (8a-5p)

## 2018-12-15 NOTE — Anesthesia Postprocedure Evaluation (Signed)
Anesthesia Post Note  Patient: Sara Davis  Procedure(s) Performed: ESOPHAGOGASTRODUODENOSCOPY (EGD) WITH PROPOFOL (N/A ) HEMOSTASIS CONTROL     Anesthesia Post Evaluation  Last Vitals:  Vitals:   12/15/18 0550 12/15/18 0912  BP: (!) 166/66 (!) 191/63  Pulse: 61 62  Resp: 18 18  Temp: 36.9 C 37.2 C  SpO2: 97% 99%    Last Pain:  Vitals:   12/15/18 0912  TempSrc: Oral  PainSc:                  Maicy Filip

## 2018-12-16 ENCOUNTER — Encounter (HOSPITAL_COMMUNITY): Payer: Self-pay | Admitting: Internal Medicine

## 2018-12-16 ENCOUNTER — Ambulatory Visit: Payer: Medicare HMO

## 2018-12-16 ENCOUNTER — Other Ambulatory Visit: Payer: Self-pay | Admitting: Family Medicine

## 2018-12-16 ENCOUNTER — Telehealth: Payer: Self-pay | Admitting: Family Medicine

## 2018-12-16 DIAGNOSIS — I10 Essential (primary) hypertension: Secondary | ICD-10-CM

## 2018-12-16 DIAGNOSIS — E119 Type 2 diabetes mellitus without complications: Secondary | ICD-10-CM

## 2018-12-16 DIAGNOSIS — Z794 Long term (current) use of insulin: Secondary | ICD-10-CM

## 2018-12-16 LAB — RENAL FUNCTION PANEL
Albumin: 2.5 g/dL — ABNORMAL LOW (ref 3.5–5.0)
Anion gap: 10 (ref 5–15)
BUN: 28 mg/dL — ABNORMAL HIGH (ref 8–23)
CO2: 17 mmol/L — ABNORMAL LOW (ref 22–32)
Calcium: 8.8 mg/dL — ABNORMAL LOW (ref 8.9–10.3)
Chloride: 114 mmol/L — ABNORMAL HIGH (ref 98–111)
Creatinine, Ser: 2.92 mg/dL — ABNORMAL HIGH (ref 0.44–1.00)
GFR calc Af Amer: 18 mL/min — ABNORMAL LOW (ref >60)
GFR calc non Af Amer: 16 mL/min — ABNORMAL LOW (ref >60)
Glucose, Bld: 219 mg/dL — ABNORMAL HIGH (ref 70–99)
Phosphorus: 3.1 mg/dL (ref 2.5–4.6)
Potassium: 3.4 mmol/L — ABNORMAL LOW (ref 3.5–5.1)
Sodium: 141 mmol/L (ref 135–145)

## 2018-12-16 LAB — GLUCOSE, CAPILLARY
Glucose-Capillary: 207 mg/dL — ABNORMAL HIGH (ref 70–99)
Glucose-Capillary: 185 mg/dL — ABNORMAL HIGH (ref 70–99)

## 2018-12-16 MED ORDER — AMLODIPINE BESYLATE 10 MG PO TABS: 10.0000 mg | ORAL_TABLET | Freq: Every day | ORAL | 0 refills | Status: DC

## 2018-12-16 MED ORDER — POTASSIUM CHLORIDE CRYS ER 20 MEQ PO TBCR
40.0000 meq | EXTENDED_RELEASE_TABLET | ORAL | Status: AC
  Administered 2018-12-16 (×2): 40 meq via ORAL
  Filled 2018-12-16 (×2): qty 2

## 2018-12-16 NOTE — Discharge Instructions (Signed)
You were hospitalized at [facility] due to anemia and worsening kidney numbers.  We expect this is from low blood carrying elements which improved after receiving blood and drinking fluids.  We are so glad you are feeling better.  Be sure to follow-up with your regularly scheduled appointments.  Please also be sure to follow-up with your PCP.  Thank you for allowing [place] to take care of you.  Reminders  -You have a lab appointment on Monday at 10 AM.  To check your kidney function and blood numbers -You have a hospital follow-up appointment on Tuesday at 10:10 AM for your blood test results and medication management. (Please see your upcoming appointment section of your after visit summary.)  Take care, [facility] family medicine team

## 2018-12-16 NOTE — Telephone Encounter (Addendum)
It appears that on her D/C summary she was instructed to continue Metformin. I contacted the patient to update but the patient already left with the D/C summary.  I called and advised her to hold her Lasix and metformin till she follows up next week with PCP. She agreed with the plan.

## 2018-12-16 NOTE — Progress Notes (Signed)
DISCHARGE NOTE HOME Sara Davis to be discharged Home per MD order. Discussed prescriptions and follow up appointments with the patient. Prescriptions given to patient; medication list explained in detail. Patient verbalized understanding.  Skin clean, dry and intact without evidence of skin break down, no evidence of skin tears noted. IV catheter discontinued intact. Site without signs and symptoms of complications. Dressing and pressure applied. Pt denies pain at the site currently. No complaints noted.  Patient free of lines, drains, and wounds.   An After Visit Summary (AVS) was printed and given to the patient. Patient escorted via wheelchair, and discharged home via private auto.  Paulla Fore, RN1

## 2018-12-16 NOTE — Progress Notes (Addendum)
The patient denies any concerns today. She self hydrated overnight to improve her renal function. She denies any GI symptoms.  Exam: Gen: She is calm in bed, not in distress. Neuro: She is awake and alert, oriented x 3. Heart: S1 S2 normal, no murmur. RRR. Lungs: CTA B/L. Abd: Soft, obese,no tenderness, normoactive bowel sound.  Ext: No, edema.   Symptomatic anemia: EGD done shows multiple duodenal AVMs, likely where she is bleeding from. She is s/p PRBC transfusion and iron infusion. Stable from GI and anemia standpoint. Continue Ferrous sulfate 325 mg BID and PPI.   Acute on Chronic stage IV CKD:  Continue oral hydration. She is s/p IVF boluses overnight. Creatinine improved to 2.9 today. Continue to home Lasix and Metformin for now. Outpatient PCP/Nephro f/u recommended.   DM2: Holding Metformin in the setting of AKI. May resume home Novolin 70/30. Unclear if she can afford Levemir as recommended by the dietician. PCP outpatient DM f/u for medication adjustment recommended.   HTN: BP improved with Norvasc 10 mg qd and Coreg 25 mg BID.     CHF (diastolic dysfunction)/HFpEF: Chronic and stable.  She is stable for d/c home with close outpatient f/u.

## 2018-12-17 LAB — BPAM RBC
Blood Product Expiration Date: 202012122359
Blood Product Expiration Date: 202012132359
Blood Product Expiration Date: 202012132359
Blood Product Expiration Date: 202012132359
Blood Product Expiration Date: 202012132359
ISSUE DATE / TIME: 202011100951
ISSUE DATE / TIME: 202011101225
ISSUE DATE / TIME: 202011101629
Unit Type and Rh: 5100
Unit Type and Rh: 5100
Unit Type and Rh: 5100
Unit Type and Rh: 5100
Unit Type and Rh: 5100

## 2018-12-17 LAB — TYPE AND SCREEN
ABO/RH(D): O POS
Antibody Screen: NEGATIVE
Unit division: 0
Unit division: 0
Unit division: 0
Unit division: 0
Unit division: 0

## 2018-12-19 ENCOUNTER — Other Ambulatory Visit: Payer: Medicare HMO

## 2018-12-19 ENCOUNTER — Other Ambulatory Visit: Payer: Self-pay | Admitting: Family Medicine

## 2018-12-19 ENCOUNTER — Other Ambulatory Visit: Payer: Self-pay

## 2018-12-19 ENCOUNTER — Ambulatory Visit: Payer: Medicare HMO | Admitting: Family Medicine

## 2018-12-19 DIAGNOSIS — N179 Acute kidney failure, unspecified: Secondary | ICD-10-CM

## 2018-12-19 DIAGNOSIS — D649 Anemia, unspecified: Secondary | ICD-10-CM

## 2018-12-19 NOTE — Patient Outreach (Signed)
[facility]) Care Management  12/19/2018  MIRISSA LOPRESTI 04/10/1950 [id number]    EMMI-GENERAL DISCHARGE RED ON EMMI ALERT Day # 1 Date: 12/18/2018 Red Alert Reason: "Unfilled prescriptions? Yes"   Outreach attempt # 1 to patient. No answer. RN CM left HIPAA compliant voicemail message along with contact info.        Plan: RN CM will make outreach attempt to patient within 3-4 business days. RN CM will send unsuccessful outreach letter to patient.  [name], RN,BSN,CCM [place] Management Telephonic Care Management Coordinator Direct Phone: [phone] Toll Free: [phone] Fax: [phone]

## 2018-12-19 NOTE — Progress Notes (Signed)
Placed orders for outpt bmp and cbc  Guadalupe Dawn MD PGY-3 Family Medicine Resident

## 2018-12-20 ENCOUNTER — Ambulatory Visit (INDEPENDENT_AMBULATORY_CARE_PROVIDER_SITE_OTHER): Payer: Medicare HMO | Admitting: Family Medicine

## 2018-12-20 ENCOUNTER — Other Ambulatory Visit: Payer: Self-pay

## 2018-12-20 VITALS — BP 124/56 | HR 64 | Wt 154.4 lb

## 2018-12-20 DIAGNOSIS — D649 Anemia, unspecified: Secondary | ICD-10-CM | POA: Diagnosis not present

## 2018-12-20 DIAGNOSIS — I272 Pulmonary hypertension, unspecified: Secondary | ICD-10-CM | POA: Diagnosis not present

## 2018-12-20 DIAGNOSIS — E1165 Type 2 diabetes mellitus with hyperglycemia: Secondary | ICD-10-CM | POA: Diagnosis not present

## 2018-12-20 DIAGNOSIS — N184 Chronic kidney disease, stage 4 (severe): Secondary | ICD-10-CM

## 2018-12-20 MED ORDER — ATORVASTATIN CALCIUM 80 MG PO TABS: 80.0000 mg | ORAL_TABLET | Freq: Every day | ORAL | 1 refills | Status: AC

## 2018-12-20 MED ORDER — OZEMPIC (0.25 OR 0.5 MG/DOSE) 2 MG/1.5ML ~~LOC~~ SOPN: 0.2500 mg | PEN_INJECTOR | SUBCUTANEOUS | 0 refills | Status: DC

## 2018-12-20 MED ORDER — OZEMPIC (0.25 OR 0.5 MG/DOSE) 2 MG/1.5ML ~~LOC~~ SOPN: 0.5000 mg | PEN_INJECTOR | SUBCUTANEOUS | 2 refills | Status: DC

## 2018-12-20 MED ORDER — AMLODIPINE BESYLATE 10 MG PO TABS: 10.0000 mg | ORAL_TABLET | Freq: Every day | ORAL | 1 refills | Status: DC

## 2018-12-20 MED ORDER — FERROUS SULFATE 325 (65 FE) MG PO TABS: 325.0000 mg | ORAL_TABLET | ORAL | 1 refills | Status: DC

## 2018-12-20 MED ORDER — OZEMPIC (0.25 OR 0.5 MG/DOSE) 2 MG/1.5ML ~~LOC~~ SOPN: 0.2500 mg | PEN_INJECTOR | SUBCUTANEOUS | 2 refills | Status: DC

## 2018-12-20 NOTE — Progress Notes (Signed)
Follow up outpatient visit after Hospitalization  Sara Davis is alone Sources of clinical information for visit is/are patient. The Discharge Summary for the hospitalization from 12/13/18 to 12/16/18 was reviewed.  Nursing assessment for this office visit was reviewed with the patient for accuracy and revision.  BP (!) 124/56   Pulse 64   Wt 154 lb 6.4 oz (70 kg)   SpO2 98%   BMI 24.92 kg/m   HPI Principle Diagnosis requiring hospitalization: Symptomatic anemia   Brief Hospital course summary: presented to ED with DOE after clinic visit revealing hgb of 5.1. FOBT in ED was + but denied hematochezia and melena.  She was given 3 u pRBC.GI consulted and patient underwent EGD on 12/14/18 that showed mild esophagitis and multiple duodenal AVS which were treated endoscopically and hemostatic. She also received feraheme x 1. Patient also had AKI (Cr 3.6) which downtrended to 2.92. She is to follow up with oncology, nephrology.  Follow up instructions from patient's hospital healthcare providers:   Issues for Follow Up:  1. Per GI: Patient should take iron 325 mg twice daily indefinitely. 2. Patient has stage IV chronic kidney disease.  We discontinued patient's Metformin in the setting of her renal disease.  Patient is to follow-up with [facility].  3. Patient will need BMP and CBC to assess for normalization of creatine and monitoring of hemoglobin at hospital follow-up. 4. New medication: Amlodipine 10 mg.  Increased Coreg to 25 mg twice daily.  Please see to it that these medication changes are documented in her medical record. -- patient is tolerating well  5. Patient has scheduled follow-up with hematology, Dr. Lorenso Courier, on 1120 12/23/18 at 10 AM.  Please remind patient of the importance of attending this appointment. 6. Recommend outpatient referral to cardiology regarding findings of pulmonary hypertension seen on  echocardiogram. ---------------------------------------------------------------------------------------------------------------------- Problems since hospital discharge: None  ---------------------------------------------------------------------------------------------------------------------- Follow up appointments with specialists:  Pending: referral to cardiology today for pulmonary HTN on Echocardiogram.  Future Appointments  Date Time Provider [place]  12/23/2018 10:00 AM Ledell Peoples IV, MD [facility] None  12/23/2018 11:15 AM [facility] LAB 1 [facility] None  03/09/2019 12:30 PM [facility] MM 3 [facility]  03/09/2019  1:00 PM [facility] DX DEXA 1 [facility]   Completed: no others.  ---------------------------------------------------------------------------------------------------------------------- New medications started during hospitalization: 325 mg iron BID indefinitely. Increased coreg to 25 mg BID and added amlodipine 10mg   - patient tolerating new medications well.  - Starting Ozempic today per inpatient team's plan.  Chronic medications stopped during hospitalization: Lasix 40 mg & metformin 1000mg   Patient's Medication List was updated in the EMR: yes --------------------------------------------------------------------------------------------------------------------- Home Health Services: n/a Durable Medical Equipment: n/a  --------------------------------------------------------------------------------------------------------------------- ADLs Independent Needs Assistance Dependent  Bathing x    Dressing x    Ambulation x    Toileting x    Eating x     IADL Independent Needs Assistance Dependent  Cooking x    Housework x    Manage Medications x    Manage the telephone x    Shopping for food, clothes, Meds, etc x    Use transportation x    Manage Finances x      Assessment and plan:   Patient started on 0.25 mg ozempic once weekly.  Patient to increase by 0.25 weekly and follow up in 2 weeks. Return precautions provided.  Will also obtain CBC and CMP  Will discuss cardiology referral with PCP and address at next visit  Wilber Oliphant, M.D.  5:49 PM 12/25/2018

## 2018-12-20 NOTE — Patient Outreach (Signed)
[facility]) Care Management  12/20/2018  LAKIN RHINE Apr 26, 1950 [id number]   EMMI-GENERAL DISCHARGE RED ON EMMI ALERT Day # 1 Date: 12/18/2018 Red Alert Reason: "Unfilled prescriptions? Yes"   Outreach attempt #2 to patient. No answer at present.      Plan: RN CM will make outreach attempt to patient within 3-4 business day.   [name], RN,BSN,CCM [place] Management Telephonic Care Management Coordinator Direct Phone: [phone] Toll Free: [phone] Fax: [phone]

## 2018-12-20 NOTE — Patient Instructions (Addendum)
Dear Sara Davis,   It was good to see you! Thank you for taking your time to come in to be seen. Today, we discussed the following:   Hospitalization Follow-up   Go to the remained of your follow up appointments listed on this packet   We sent a referral to cardiology to follow up for pulmonary hypertension   Diabetes    Starting Ozempic, a once weekly injection for diabetes   Start with 0.25 mg once weekly for 4 weeks. Then increase to 0.5 mg weekly.   The pharmacist showed you how to use the medication. I have also attached some information to this packet.   Please follow up in 4 weeks before switching the higher dose. This can be done over virtual visit.   Be well,   Zettie Cooley, M.D   [facility] [phone]  *Sign up for MyChart for instant access to your health profile, labs, orders, upcoming appointments or to contact your provider with questions*  =================================================================================== Semaglutide injection solution What is this medicine? SEMAGLUTIDE (Sem a GLOO tide) is used to improve blood sugar control in adults with type 2 diabetes. This medicine may be used with other diabetes medicines. This drug may also reduce the risk of heart attack or stroke if you have type 2 diabetes and risk factors for heart disease. This medicine may be used for other purposes; ask your health care provider or pharmacist if you have questions. COMMON BRAND NAME(S): OZEMPIC What should I tell my health care provider before I take this medicine? They need to know if you have any of these conditions:  endocrine tumors (MEN 2) or if someone in your family had these tumors  eye disease, vision problems  history of pancreatitis  kidney disease  stomach problems  thyroid cancer or if someone in your family had thyroid cancer  an unusual or allergic reaction to semaglutide, other medicines, foods, dyes, or  preservatives  pregnant or trying to get pregnant  breast-feeding How should I use this medicine? This medicine is for injection under the skin of your upper leg (thigh), stomach area, or upper arm. It is given once every week (every 7 days). You will be taught how to prepare and give this medicine. Use exactly as directed. Take your medicine at regular intervals. Do not take it more often than directed. If you use this medicine with insulin, you should inject this medicine and the insulin separately. Do not mix them together. Do not give the injections right next to each other. Change (rotate) injection sites with each injection. It is important that you put your used needles and syringes in a special sharps container. Do not put them in a trash can. If you do not have a sharps container, call your pharmacist or healthcare provider to get one. A special MedGuide will be given to you by the pharmacist with each prescription and refill. Be sure to read this information carefully each time. Talk to your pediatrician regarding the use of this medicine in children. Special care may be needed. Overdosage: If you think you have taken too much of this medicine contact a poison control center or emergency room at once. NOTE: This medicine is only for you. Do not share this medicine with others. What if I miss a dose? If you miss a dose, take it as soon as you can within 5 days after the missed dose. Then take your next dose at your regular weekly time. If it  has been longer than 5 days after the missed dose, do not take the missed dose. Take the next dose at your regular time. Do not take double or extra doses. If you have questions about a missed dose, contact your health care provider for advice. What may interact with this medicine?  other medicines for diabetes Many medications may cause changes in blood sugar, these include:  alcohol containing beverages  antiviral medicines for HIV or  AIDS  aspirin and aspirin-like drugs  certain medicines for blood pressure, heart disease, irregular heart beat  chromium  diuretics  female hormones, such as estrogens or progestins, birth control pills  fenofibrate  gemfibrozil  isoniazid  lanreotide  female hormones or anabolic steroids  MAOIs like Carbex, Eldepryl, Marplan, Nardil, and Parnate  medicines for weight loss  medicines for allergies, asthma, cold, or cough  medicines for depression, anxiety, or psychotic disturbances  niacin  nicotine  NSAIDs, medicines for pain and inflammation, like ibuprofen or naproxen  octreotide  pasireotide  pentamidine  phenytoin  probenecid  quinolone antibiotics such as ciprofloxacin, levofloxacin, ofloxacin  some herbal dietary supplements  steroid medicines such as prednisone or cortisone  sulfamethoxazole; trimethoprim  thyroid hormones Some medications can hide the warning symptoms of low blood sugar (hypoglycemia). You may need to monitor your blood sugar more closely if you are taking one of these medications. These include:  beta-blockers, often used for high blood pressure or heart problems (examples include atenolol, metoprolol, propranolol)  clonidine  guanethidine  reserpine This list may not describe all possible interactions. Give your health care provider a list of all the medicines, herbs, non-prescription drugs, or dietary supplements you use. Also tell them if you smoke, drink alcohol, or use illegal drugs. Some items may interact with your medicine. What should I watch for while using this medicine? Visit your doctor or health care professional for regular checks on your progress. Drink plenty of fluids while taking this medicine. Check with your doctor or health care professional if you get an attack of severe diarrhea, nausea, and vomiting. The loss of too much body fluid can make it dangerous for you to take this medicine. A test called  the HbA1C (A1C) will be monitored. This is a simple blood test. It measures your blood sugar control over the last 2 to 3 months. You will receive this test every 3 to 6 months. Learn how to check your blood sugar. Learn the symptoms of low and high blood sugar and how to manage them. Always carry a quick-source of sugar with you in case you have symptoms of low blood sugar. Examples include hard sugar candy or glucose tablets. Make sure others know that you can choke if you eat or drink when you develop serious symptoms of low blood sugar, such as seizures or unconsciousness. They must get medical help at once. Tell your doctor or health care professional if you have high blood sugar. You might need to change the dose of your medicine. If you are sick or exercising more than usual, you might need to change the dose of your medicine. Do not skip meals. Ask your doctor or health care professional if you should avoid alcohol. Many nonprescription cough and cold products contain sugar or alcohol. These can affect blood sugar. Pens should never be shared. Even if the needle is changed, sharing may result in passing of viruses like hepatitis or HIV. Wear a medical ID bracelet or chain, and carry a card that describes your  disease and details of your medicine and dosage times. Do not become pregnant while taking this medicine. Women should inform their doctor if they wish to become pregnant or think they might be pregnant. There is a potential for serious side effects to an unborn child. Talk to your health care professional or pharmacist for more information. What side effects may I notice from receiving this medicine? Side effects that you should report to your doctor or health care professional as soon as possible:  allergic reactions like skin rash, itching or hives, swelling of the face, lips, or tongue  breathing problems  changes in vision  diarrhea that continues or is severe  lump or swelling  on the neck  severe nausea  signs and symptoms of infection like fever or chills; cough; sore throat; pain or trouble passing urine  signs and symptoms of low blood sugar such as feeling anxious, confusion, dizziness, increased hunger, unusually weak or tired, sweating, shakiness, cold, irritable, headache, blurred vision, fast heartbeat, loss of consciousness  signs and symptoms of kidney injury like trouble passing urine or change in the amount of urine  trouble swallowing  unusual stomach upset or pain  vomiting Side effects that usually do not require medical attention (report to your doctor or health care professional if they continue or are bothersome):  constipation  diarrhea  nausea  pain, redness, or irritation at site where injected  stomach upset This list may not describe all possible side effects. Call your doctor for medical advice about side effects. You may report side effects to FDA at 1-800-FDA-1088. Where should I keep my medicine? Keep out of the reach of children. Store unopened pens in a refrigerator between 2 and 8 degrees C (36 and 46 degrees F). Do not freeze. Protect from light and heat. After you first use the pen, it can be stored for 56 days at room temperature between 15 and 30 degrees C (59 and 86 degrees F) or in a refrigerator. Throw away your used pen after 56 days or after the expiration date, whichever comes first. Do not store your pen with the needle attached. If the needle is left on, medicine may leak from the pen. NOTE: This sheet is a summary. It may not cover all possible information. If you have questions about this medicine, talk to your doctor, pharmacist, or health care provider.  2020 Elsevier/Gold Standard (2018-02-18 14:25:32)

## 2018-12-21 ENCOUNTER — Other Ambulatory Visit: Payer: Self-pay

## 2018-12-21 LAB — CBC
Hematocrit: 28.6 % — ABNORMAL LOW (ref 34.0–46.6)
Hemoglobin: 9.3 g/dL — ABNORMAL LOW (ref 11.1–15.9)
MCH: 28 pg (ref 26.6–33.0)
MCHC: 32.5 g/dL (ref 31.5–35.7)
MCV: 86 fL (ref 79–97)
Platelets: 234 10*3/uL (ref 150–450)
RBC: 3.32 x10E6/uL — ABNORMAL LOW (ref 3.77–5.28)
RDW: 14.1 % (ref 11.7–15.4)
WBC: 8 10*3/uL (ref 3.4–10.8)

## 2018-12-21 LAB — BASIC METABOLIC PANEL
BUN/Creatinine Ratio: 10 — ABNORMAL LOW (ref 12–28)
BUN: 32 mg/dL — ABNORMAL HIGH (ref 8–27)
CO2: 14 mmol/L — ABNORMAL LOW (ref 20–29)
Calcium: 9.6 mg/dL (ref 8.7–10.3)
Chloride: 113 mmol/L — ABNORMAL HIGH (ref 96–106)
Creatinine, Ser: 3.27 mg/dL — ABNORMAL HIGH (ref 0.57–1.00)
GFR calc Af Amer: 16 mL/min/{1.73_m2} — ABNORMAL LOW (ref >59)
GFR calc non Af Amer: 14 mL/min/{1.73_m2} — ABNORMAL LOW (ref >59)
Glucose: 92 mg/dL (ref 65–99)
Potassium: 4.6 mmol/L (ref 3.5–5.2)
Sodium: 143 mmol/L (ref 134–144)

## 2018-12-21 NOTE — Patient Outreach (Signed)
[facility]) Care Management  12/21/2018  Sara Davis 06-19-1950 [id number]   EMMI-GENERAL DISCHARGE RED ON EMMI ALERT Day #1 Date:12/18/2018 Red Alert Reason:"Unfilled prescriptions? Yes"    Outreach attempt # 3 to patient. No answer at present.     Plan: RN CM will close case if no response from letter sent to patient.   [name], RN,BSN,CCM [place] Management Telephonic Care Management Coordinator Direct Phone: [phone] Toll Free: [phone] Fax: [phone]

## 2018-12-21 NOTE — Progress Notes (Signed)
Asked by Dr. Maudie Mercury to educate [name] on how to use new Ozemic pen. Patient educated on purpose, proper use and potential adverse effects of Ozempic. The proper use of the pen was demonstrated via a sample pen. Following instruction patient verbalized understanding of treatment plan.   Sara Davis, PharmD PGY1 Acute Care Pharmacy Resident

## 2018-12-22 NOTE — Progress Notes (Signed)
[place] [phone]   Fax:([phone] [place] NOTE  Patient Care Team: [name], [name], DO as PCP - General (Family Medicine) Tania Ade, Tomasa Blase, RN as [place] History #Normocytic Anemia 1) 01/24/2009: Hgb 14.6, last normal on record 2) 10/13/2014: Hgb 8.4  3) 01/05/2018: 3 units of PRBC for symptomatic anemia of 4.6. Endoscopy was performed and found nonbleeding  gastric ulcers, confirmed diagnosis of H. Pylori. 4) 02/23/2018: Hgb 10.3 5) 12/13/2018: Hgb 4.7, 3 units of PRBC for symptomatic anemia. 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 Iron24, TIBC 395, ferritin 7, Sat 6%.  6) 12/14/2018:  500 mg Feraheme administered. 7) 12/20/2018: started PO iron 325mg  QOD  8) 12/22/2018: Establish care with Dr. Lorenso Courier   CHIEF COMPLAINTS/PURPOSE OF CONSULTATION:  Chronic Anemia  HISTORY OF PRESENTING ILLNESS:  Sara Davis 68 y.o. female with medical history significant for DM type II, stage IV CKD and HTN who presents for evaluation of anemia.   On review of prior records, Sara Davis has had a longstanding anemia, dating back to at least 2016. She had a recent hospitalization from 11/10 to 12/16/2018 where she presented to the ED with dyspnea and was found to have a Hgb 4.7. She received 3 units of PRBC and GI was consulted. 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 She also received 500 mg Feraheme on 12/14/2018.  Of note, she has had 6 blood transfusions on record, with 3 administered on 01/06/2018 and 3 on 12/13/2018. The prior admission in Dec 2019 was due to dyspnea from a Hgb 4.5. Endoscopy was performed and found nonbleeding  gastric ulcers and mild esophagitis, with biopsy confirming H. Pylori infection.   On exam today Sara Davis notes that she feels much better as compared to when  she was previously admitted.  She notes that prior to the admission for approximately 1 month's time she was feeling fatigued and shortness of breath.  She was not able to walk from her bedroom to the kitchen without feeling tired.  She knows the people were commenting on her paleness at that time.  Following the admission she notes that she feels much better and that the symptoms have resolved.  She is no longer having dark stools.  However she endorses that she has been constipated somewhat because of the p.o. iron that she has been taking.  Even though these pills recommended as every other day she has been taking it daily.  The only other issue she brought to our attention today was bilateral lower extremity swelling.  She denies any other symptoms.  She denies fevers chills sweats nausea vomiting or diarrhea.  Full 10 point ROS is listed below.    MEDICAL HISTORY:  Past Medical History:  Diagnosis Date   Cellulitis of scalp 02/2015   Depression    Diabetes mellitus without complication ([place])    Hypertension     SURGICAL HISTORY: Past Surgical History:  Procedure Laterality Date   ABDOMINAL HYSTERECTOMY     BIOPSY  01/07/2018   Procedure: BIOPSY;  Surgeon: Irene Shipper, MD;  Location[facility] ENDOSCOPY;  Service: Endoscopy;;  Clotest   COLONOSCOPY WITH PROPOFOL N/A 01/07/2018   Procedure: COLONOSCOPY WITH PROPOFOL;  Surgeon: Irene Shipper, MD;  Location: [facility] ENDOSCOPY;  Service: Endoscopy;  Laterality: N/A;   ESOPHAGOGASTRODUODENOSCOPY (EGD) WITH PROPOFOL N/A 01/07/2018   Procedure: ESOPHAGOGASTRODUODENOSCOPY (EGD) WITH PROPOFOL;  Surgeon:  Irene Shipper, MD;  Location[facility] ENDOSCOPY;  Service: Endoscopy;  Laterality: N/A;   ESOPHAGOGASTRODUODENOSCOPY (EGD) WITH PROPOFOL N/A 12/14/2018   Procedure: ESOPHAGOGASTRODUODENOSCOPY (EGD) WITH PROPOFOL;  Surgeon: Irene Shipper, MD;  Location: [facility] ENDOSCOPY;  Service: Endoscopy;  Laterality: N/A;   HEMOSTASIS CONTROL  12/14/2018   Procedure:  HEMOSTASIS CONTROL;  Surgeon: Irene Shipper, MD;  Location[facility] ENDOSCOPY;  Service: Endoscopy;;   POLYPECTOMY  01/07/2018   Procedure: POLYPECTOMY;  Surgeon: Irene Shipper, MD;  Location: [facility] ENDOSCOPY;  Service: Endoscopy;;    SOCIAL HISTORY: Social History   Socioeconomic History   Marital status: Single    Spouse name: Not on file   Number of children: 2   Years of education: Not on file   Highest education level: Not on file  Occupational History   Occupation: Retired  Scientist, product/process development strain: Not on file   Food insecurity    Worry: Not on file    Inability: Not on Lexicographer needs    Medical: Not on file    Non-medical: Not on file  Tobacco Use   Smoking status: Current Every Day Smoker    Packs/day: 0.50    Years: 40.00    Pack years: 20.00    Types: Cigarettes   Smokeless tobacco: Never Used   Tobacco comment: down from 1.5 ppd  Substance and Sexual Activity   Alcohol use: No    Alcohol/week: 0.0 standard drinks   Drug use: No   Sexual activity: Not on file  Lifestyle   Physical activity    Days per week: Not on file    Minutes per session: Not on file   Stress: Not on file  Relationships   Social connections    Talks on phone: Not on file    Gets together: Not on file    Attends religious service: Not on file    Active member of club or organization: Not on file    Attends meetings of clubs or organizations: Not on file    Relationship status: Not on file   Intimate partner violence    Fear of current or ex partner: Not on file    Emotionally abused: Not on file    Physically abused: Not on file    Forced sexual activity: Not on file  Other Topics Concern   Not on file  Social History Narrative   Not on file    FAMILY HISTORY: Family History  Problem Relation Age of Onset   Diabetes Mother    Cancer Mother    Diabetes Sister    Hypertension Sister    Cancer Sister 36       Bone    Diabetes Brother    Diabetes Sister    Diabetes Sister     ALLERGIES:  is allergic to aspirin.  MEDICATIONS:  Current Outpatient Medications  Medication Sig Dispense Refill   Alcohol Swabs (B-D SINGLE USE SWABS REGULAR) PADS 1 Units by Does not apply route 3 (three) times daily as needed. 90 each 2   amLODipine (NORVASC) 10 MG tablet Take 1 tablet (10 mg total) by mouth daily. 90 tablet 1   atorvastatin (LIPITOR) 80 MG tablet Take 1 tablet (80 mg total) by mouth daily. 90 tablet 1   Blood Glucose Calibration (ACCU-CHEK AVIVA) SOLN 1 Units by In Vitro route 3 (three) times daily as needed. 90 each 0   Blood Glucose Monitoring Suppl (ACCU-CHEK AVIVA) device Use as  instructed 1 each 0   carvedilol (COREG) 25 MG tablet Take 1 tablet (25 mg total) by mouth 2 (two) times daily. 180 tablet 3   ferrous sulfate 325 (65 FE) MG tablet Take 1 tablet (325 mg total) by mouth every other day. 45 tablet 1   glucose blood (ACCU-CHEK AVIVA PLUS) test strip Use as instructed 100 each 12   insulin NPH-regular Human (70-30) 100 UNIT/ML injection Inject 22 Units into the skin 2 (two) times daily. (Patient taking differently: Inject 20 Units into the skin 2 (two) times daily. ) 1 vial 2   Insulin Pen Needle (B-D ULTRAFINE III SHORT PEN) 31G X 8 MM MISC CHECK BLOOD SUGARS ONCE IN MORNING BEFORE FOOD AND ONCE IN THE EVENING. 100 each 0   Lancets (ACCU-CHEK SOFT TOUCH) lancets Use as instructed 100 each 12   Semaglutide,0.25 or 0.5MG /DOS, (OZEMPIC, 0.25 OR 0.5 MG/DOSE,) 2 MG/1.5ML SOPN Inject 0.25 mg into the skin once a week. 1 pen 0   No current facility-administered medications for this visit.     REVIEW OF SYSTEMS:   Constitutional: ( - ) fevers, ( - )  chills , ( - ) night sweats Eyes: ( - ) blurriness of vision, ( - ) double vision, ( - ) watery eyes Ears, nose, mouth, throat, and face: ( - ) mucositis, ( - ) sore throat Respiratory: ( - ) cough, ( - ) dyspnea, ( - ) wheezes Cardiovascular:  ( - ) palpitation, ( - ) chest discomfort, ( - ) lower extremity swelling Gastrointestinal:  ( - ) nausea, ( - ) heartburn, ( - ) change in bowel habits Skin: ( - ) abnormal skin rashes Lymphatics: ( - ) new lymphadenopathy, ( - ) easy bruising Neurological: ( - ) numbness, ( - ) tingling, ( - ) new weaknesses Behavioral/Psych: ( - ) mood change, ( - ) new changes  All other systems were reviewed with the patient and are negative.  PHYSICAL EXAMINATION: ECOG PERFORMANCE STATUS: 1 - Symptomatic but completely ambulatory  Vitals:   12/23/18 1015  BP: 132/70  Pulse: 95  Resp: 18  Temp: 98.9 F (37.2 C)  SpO2: 100%   Filed Weights   12/23/18 1015  Weight: 154 lb 6.4 oz (70 kg)    GENERAL: well appearing elderly African American female in NAD  SKIN: skin color, texture, turgor are normal, no rashes or significant lesions EYES: conjunctiva are pale, but non-injected, sclera clear LUNGS: clear to auscultation and percussion with normal breathing effort HEART: regular rate & rhythm and no murmurs and no lower extremity edema ABDOMEN: soft, non-tender, non-distended, normal bowel sounds Musculoskeletal: no cyanosis of digits and no clubbing  PSYCH: alert & oriented x 3, fluent speech NEURO: no focal motor/sensory deficits  LABORATORY DATA:  I have reviewed the data as listed Lab Results  Component Value Date   WBC 9.8 12/23/2018   HGB 9.4 (L) 12/23/2018   HCT 29.5 (L) 12/23/2018   MCV 89.7 12/23/2018   PLT 241 12/23/2018   NEUTROABS 7.6 12/23/2018    PATHOLOGY: None to review.   BLOOD FILM:  I personally reviewed the patient's peripheral blood smear today.  There was no peripheral blast.  The white blood cells had no hypersegmented neutrophils or clear abnormalities. Red blood cells were of normal morphology. There was no schistocytosis or anisocytosis.  The platelets are of normal size and I have verified that there were no platelet clumping.  RADIOGRAPHIC STUDIES: No  relevant radiographic images  ASSESSMENT & PLAN Sara Davis 68 y.o. female with medical history significant for DM type II, stage IV CKD and HTN who presents for evaluation of anemia.  She has had 2 admissions in the last year for serious GI bleeds, with Hgb lower than 5.  Initially she had been treated for H. pylori, however during this admission she was noted to have bleeding AVMs of the duodenum.  This was managed with endoscopic intervention.  At this time it appears that the patient's anemia is multifactorial including low erythropoietin from CKD as well as GI bleeding and iron deficiency.  She is currently taking p.o. iron as prescribed and was counseled on the use of vitamin C to improve absorption.  To complete our work-up we will order an erythropoietin level reticulocyte panel as well as an MMA and homocystine to confirm that there are not low levels of vitamin Z36 or folic acid.  We will have her return to clinic in 3 months time to assure the p.o. iron is increasing her iron levels.  At this time there is no clear indication for IV iron however if she was not having adequate repletion with the p.o. therapy we could consider this therapy.  #Normocytic Anemia #Iron Deficiency Anemia 2/2 to Chronic Blood Loss --patient's anemia is clearly multifactorial, including low erythropoietin from CKD, recent GI bleed and iron deficiency  --continue PO iron 325mg  QOD with a source of vitamin C --today will check MMA and homocysteine levels as the patient had borderline low serum Vitamin b12. Will hold on repeat iron panel so close to administration of feraheme.  --will check repeat CBC, retic panel, EPO level, and peripheral blood film  --per records there has been no confirmatory testing to denote resolution of H. Pylori. Can consider stool antigen testing if iron levels not improving at next visit.  --RTC in 3 months. Strict return precautions for symptoms of anemia.   Orders Placed This  Encounter  Procedures   CBC with Differential ([place] Only)    Standing Status:   Future    Number of Occurrences:   1    Standing Expiration Date:   12/23/2019   Retic Panel    Standing Status:   Future    Number of Occurrences:   1    Standing Expiration Date:   12/23/2019   Save Smear ([facility])    Standing Status:   Future    Number of Occurrences:   1    Standing Expiration Date:   12/23/2019   CMP ([place] only)    Standing Status:   Future    Number of Occurrences:   1    Standing Expiration Date:   12/23/2019   Erythropoietin    Standing Status:   Future    Number of Occurrences:   1    Standing Expiration Date:   12/23/2019   Methylmalonic acid, serum    Standing Status:   Future    Number of Occurrences:   1    Standing Expiration Date:   12/23/2019   Homocysteine, serum    Standing Status:   Future    Number of Occurrences:   1    Standing Expiration Date:   12/23/2019    All questions were answered. The patient knows to call the clinic with any problems, questions or concerns.  A total of more than 45 minutes were spent face-to-face with the patient during this encounter and over half of that time was spent on  counseling and coordination of care as outlined above.   Ledell Peoples, MD Department of Hematology/Oncology [place] at [facility] Phone: [phone] Pager: [phone] Email: [email or web address]  12/23/2018 12:01 PM

## 2018-12-23 ENCOUNTER — Inpatient Hospital Stay (HOSPITAL_BASED_OUTPATIENT_CLINIC_OR_DEPARTMENT_OTHER): Payer: Medicare HMO | Admitting: Hematology and Oncology

## 2018-12-23 ENCOUNTER — Inpatient Hospital Stay: Payer: Medicare HMO | Attending: Hematology and Oncology

## 2018-12-23 ENCOUNTER — Other Ambulatory Visit: Payer: Self-pay

## 2018-12-23 ENCOUNTER — Encounter: Payer: Self-pay | Admitting: Hematology and Oncology

## 2018-12-23 VITALS — BP 132/70 | HR 95 | Temp 98.9°F | Resp 18 | Ht 66.0 in | Wt 154.4 lb

## 2018-12-23 DIAGNOSIS — F17210 Nicotine dependence, cigarettes, uncomplicated: Secondary | ICD-10-CM | POA: Diagnosis not present

## 2018-12-23 DIAGNOSIS — Z79899 Other long term (current) drug therapy: Secondary | ICD-10-CM | POA: Insufficient documentation

## 2018-12-23 DIAGNOSIS — N184 Chronic kidney disease, stage 4 (severe): Secondary | ICD-10-CM | POA: Diagnosis not present

## 2018-12-23 DIAGNOSIS — D631 Anemia in chronic kidney disease: Secondary | ICD-10-CM

## 2018-12-23 DIAGNOSIS — I129 Hypertensive chronic kidney disease with stage 1 through stage 4 chronic kidney disease, or unspecified chronic kidney disease: Secondary | ICD-10-CM | POA: Diagnosis not present

## 2018-12-23 DIAGNOSIS — E1122 Type 2 diabetes mellitus with diabetic chronic kidney disease: Secondary | ICD-10-CM | POA: Diagnosis not present

## 2018-12-23 DIAGNOSIS — D500 Iron deficiency anemia secondary to blood loss (chronic): Secondary | ICD-10-CM

## 2018-12-23 LAB — CMP (CANCER CENTER ONLY)
ALT: 9 U/L (ref 0–44)
AST: 12 U/L — ABNORMAL LOW (ref 15–41)
Albumin: 3.4 g/dL — ABNORMAL LOW (ref 3.5–5.0)
Alkaline Phosphatase: 129 U/L — ABNORMAL HIGH (ref 38–126)
Anion gap: 7 (ref 5–15)
BUN: 31 mg/dL — ABNORMAL HIGH (ref 8–23)
CO2: 18 mmol/L — ABNORMAL LOW (ref 22–32)
Calcium: 9.4 mg/dL (ref 8.9–10.3)
Chloride: 117 mmol/L — ABNORMAL HIGH (ref 98–111)
Creatinine: 3.53 mg/dL (ref 0.44–1.00)
GFR, Est AFR Am: 15 mL/min — ABNORMAL LOW (ref >60)
GFR, Estimated: 13 mL/min — ABNORMAL LOW (ref >60)
Glucose, Bld: 226 mg/dL — ABNORMAL HIGH (ref 70–99)
Potassium: 5.1 mmol/L (ref 3.5–5.1)
Sodium: 142 mmol/L (ref 135–145)
Total Bilirubin: 0.3 mg/dL (ref 0.3–1.2)
Total Protein: 6.2 g/dL — ABNORMAL LOW (ref 6.5–8.1)

## 2018-12-23 LAB — CBC WITH DIFFERENTIAL (CANCER CENTER ONLY)
Abs Immature Granulocytes: 0.04 10*3/uL (ref 0.00–0.07)
Basophils Absolute: 0 10*3/uL (ref 0.0–0.1)
Basophils Relative: 0 %
Eosinophils Absolute: 0.2 10*3/uL (ref 0.0–0.5)
Eosinophils Relative: 2 %
HCT: 29.5 % — ABNORMAL LOW (ref 36.0–46.0)
Hemoglobin: 9.4 g/dL — ABNORMAL LOW (ref 12.0–15.0)
Immature Granulocytes: 0 %
Lymphocytes Relative: 16 %
Lymphs Abs: 1.5 10*3/uL (ref 0.7–4.0)
MCH: 28.6 pg (ref 26.0–34.0)
MCHC: 31.9 g/dL (ref 30.0–36.0)
MCV: 89.7 fL (ref 80.0–100.0)
Monocytes Absolute: 0.4 10*3/uL (ref 0.1–1.0)
Monocytes Relative: 4 %
Neutro Abs: 7.6 10*3/uL (ref 1.7–7.7)
Neutrophils Relative %: 78 %
Platelet Count: 241 10*3/uL (ref 150–400)
RBC: 3.29 MIL/uL — ABNORMAL LOW (ref 3.87–5.11)
RDW: 15.2 % (ref 11.5–15.5)
WBC Count: 9.8 10*3/uL (ref 4.0–10.5)
nRBC: 0 % (ref 0.0–0.2)

## 2018-12-23 LAB — SAVE SMEAR(SSMR), FOR PROVIDER SLIDE REVIEW

## 2018-12-23 LAB — RETIC PANEL
Immature Retic Fract: 8 % (ref 2.3–15.9)
RBC.: 3.39 MIL/uL — ABNORMAL LOW (ref 3.87–5.11)
Retic Count, Absolute: 49.2 10*3/uL (ref 19.0–186.0)
Retic Ct Pct: 1.5 % (ref 0.4–3.1)
Reticulocyte Hemoglobin: 33 pg (ref >27.9)

## 2018-12-24 LAB — HOMOCYSTEINE: Homocysteine: 14.4 umol/L (ref 0.0–17.2)

## 2018-12-24 LAB — ERYTHROPOIETIN: Erythropoietin: 7.6 m[IU]/mL (ref 2.6–18.5)

## 2018-12-25 ENCOUNTER — Encounter: Payer: Self-pay | Admitting: Family Medicine

## 2018-12-26 NOTE — Addendum Note (Signed)
Addended by: Wilber Oliphant on: 12/26/2018 04:26 PM   Modules accepted: Orders

## 2018-12-27 DIAGNOSIS — E113513 Type 2 diabetes mellitus with proliferative diabetic retinopathy with macular edema, bilateral: Secondary | ICD-10-CM | POA: Diagnosis not present

## 2018-12-30 LAB — METHYLMALONIC ACID, SERUM: Methylmalonic Acid, Quantitative: 437 nmol/L — ABNORMAL HIGH (ref 0–378)

## 2019-01-02 ENCOUNTER — Telehealth: Payer: Self-pay | Admitting: Hematology and Oncology

## 2019-01-02 ENCOUNTER — Other Ambulatory Visit: Payer: Self-pay

## 2019-01-02 DIAGNOSIS — N2581 Secondary hyperparathyroidism of renal origin: Secondary | ICD-10-CM | POA: Diagnosis not present

## 2019-01-02 DIAGNOSIS — D631 Anemia in chronic kidney disease: Secondary | ICD-10-CM | POA: Diagnosis not present

## 2019-01-02 DIAGNOSIS — Z1159 Encounter for screening for other viral diseases: Secondary | ICD-10-CM | POA: Diagnosis not present

## 2019-01-02 DIAGNOSIS — K31811 Angiodysplasia of stomach and duodenum with bleeding: Secondary | ICD-10-CM | POA: Diagnosis not present

## 2019-01-02 DIAGNOSIS — N184 Chronic kidney disease, stage 4 (severe): Secondary | ICD-10-CM | POA: Diagnosis not present

## 2019-01-02 DIAGNOSIS — I129 Hypertensive chronic kidney disease with stage 1 through stage 4 chronic kidney disease, or unspecified chronic kidney disease: Secondary | ICD-10-CM | POA: Diagnosis not present

## 2019-01-02 DIAGNOSIS — Z720 Tobacco use: Secondary | ICD-10-CM | POA: Diagnosis not present

## 2019-01-02 DIAGNOSIS — E1122 Type 2 diabetes mellitus with diabetic chronic kidney disease: Secondary | ICD-10-CM | POA: Diagnosis not present

## 2019-01-02 DIAGNOSIS — N189 Chronic kidney disease, unspecified: Secondary | ICD-10-CM | POA: Diagnosis not present

## 2019-01-02 MED ORDER — VITAMIN B-12 1000 MCG PO TABS: 1000.0000 ug | ORAL_TABLET | Freq: Every day | ORAL | 3 refills | Status: DC

## 2019-01-02 NOTE — Telephone Encounter (Signed)
Called Ms. Sara Davis to discuss the results of her bloodwork. Findings showed that MMA is elevated (with borderline low serum Vit B12). Will call in 1000 mcg vitamin B12 daily PO to her pharmacy. Will keep RTC in 3 months to assess progress. Patient has also established with nephrology in the interim.   Ledell Peoples, MD Department of Hematology/Oncology [place] at [facility] Phone: [phone] Pager: [phone] Email: [email or web address]

## 2019-01-02 NOTE — Patient Outreach (Addendum)
[facility]) Care Management  01/02/2019  Sara Davis 1950/04/23 [id number]   EMMI-GENERAL DISCHARGE RED ON EMMI ALERT Day #1 Date:12/18/2018 Red Alert Reason:"Unfilled prescriptions? Yes"   Multiple attempts to establish contact with patient without success. No response from letter mailed to patient. Case is being closed at this time.     Plan: RN CM will close case at this time.   [name], RN,BSN,CCM [place] Management Telephonic Care Management Coordinator Direct Phone: [phone] Toll Free: [phone] Fax: [phone]

## 2019-01-05 ENCOUNTER — Telehealth: Payer: Self-pay | Admitting: *Deleted

## 2019-01-05 NOTE — Telephone Encounter (Signed)
A message was left, re: her new patient appointment. 

## 2019-01-12 ENCOUNTER — Ambulatory Visit: Payer: Medicare HMO | Admitting: Family Medicine

## 2019-01-16 NOTE — Progress Notes (Signed)
Cardiology Office Note:    Date:  01/17/2019   ID:  Sara Davis Apr 28, 1950, MRN [id number]  PCP:  [name], [name], DO  Cardiologist:  Donato Heinz, MD  Electrophysiologist:  None   Referring MD: McDiarmid, Blane Ohara, MD   Chief Complaint  Patient presents with  . Shortness of Breath    History of Present Illness:    Sara Davis is a 68 y.o. female with a hx of HFpEF, COPD, type 2 diabetes, CKD stage IV, hypertension referred by Dr. McDiarmid for evaluation of pulmonary hypertension.  Recent admission 12/13/2018 through 12/16/2018.  She had presented with dyspnea on exertion and found to have hemoglobin 4.7.  GI was consulted, underwent an EGD which showed mild esophagitis and multiple duodenal AVMs that were treated endoscopically with hemostatic therapy.  Also found to have AKI on CKD (creatinine 3.6), with improvement to 2.9 on discharge.  TTE showed pulmonary hypertension so was referred to cardiology as an outpatient.  Denies any bleeding since she was discharge from the hospital.   She reports that prior to hospitalization she was having shortness of breath with minimal exertion and lightheadedness.  States that since she got out of the hospital she is has not had any shortness of breath, lightheadedness, syncope, or chest pain.  States that the most activity she does is cleaning her house.  States that since he got out of the hospital she is able to do this without any symptoms.    Past Medical History:  Diagnosis Date  . Cellulitis of scalp 02/2015  . Depression   . Diabetes mellitus without complication ([place])   . Hypertension     Past Surgical History:  Procedure Laterality Date  . ABDOMINAL HYSTERECTOMY    . BIOPSY  01/07/2018   Procedure: BIOPSY;  Surgeon: Irene Shipper, MD;  Location: [facility] ENDOSCOPY;  Service: Endoscopy;;  Clotest  . COLONOSCOPY WITH PROPOFOL N/A 01/07/2018   Procedure: COLONOSCOPY WITH PROPOFOL;  Surgeon: Irene Shipper, MD;   Location[facility] ENDOSCOPY;  Service: Endoscopy;  Laterality: N/A;  . ESOPHAGOGASTRODUODENOSCOPY (EGD) WITH PROPOFOL N/A 01/07/2018   Procedure: ESOPHAGOGASTRODUODENOSCOPY (EGD) WITH PROPOFOL;  Surgeon: Irene Shipper, MD;  Location: [facility] ENDOSCOPY;  Service: Endoscopy;  Laterality: N/A;  . ESOPHAGOGASTRODUODENOSCOPY (EGD) WITH PROPOFOL N/A 12/14/2018   Procedure: ESOPHAGOGASTRODUODENOSCOPY (EGD) WITH PROPOFOL;  Surgeon: Irene Shipper, MD;  Location: [facility] ENDOSCOPY;  Service: Endoscopy;  Laterality: N/A;  . HEMOSTASIS CONTROL  12/14/2018   Procedure: HEMOSTASIS CONTROL;  Surgeon: Irene Shipper, MD;  Location: [facility] ENDOSCOPY;  Service: Endoscopy;;  . POLYPECTOMY  01/07/2018   Procedure: POLYPECTOMY;  Surgeon: Irene Shipper, MD;  Location[facility] ENDOSCOPY;  Service: Endoscopy;;    Current Medications: Current Meds  Medication Sig  . Alcohol Swabs (B-D SINGLE USE SWABS REGULAR) PADS 1 Units by Does not apply route 3 (three) times daily as needed.  Marland Kitchen amLODipine (NORVASC) 10 MG tablet Take 1 tablet (10 mg total) by mouth daily.  Marland Kitchen atorvastatin (LIPITOR) 80 MG tablet Take 1 tablet (80 mg total) by mouth daily.  . Blood Glucose Calibration (ACCU-CHEK AVIVA) SOLN 1 Units by In Vitro route 3 (three) times daily as needed.  . Blood Glucose Monitoring Suppl (ACCU-CHEK AVIVA) device Use as instructed  . carvedilol (COREG) 25 MG tablet Take 1 tablet (25 mg total) by mouth 2 (two) times daily.  . ferrous sulfate 325 (65 FE) MG tablet Take 1 tablet (325 mg total) by mouth every other day.  Marland Kitchen  glucose blood (ACCU-CHEK AVIVA PLUS) test strip Use as instructed  . insulin NPH-regular Human (70-30) 100 UNIT/ML injection Inject 22 Units into the skin 2 (two) times daily. (Patient taking differently: Inject 20 Units into the skin 2 (two) times daily. )  . Insulin Pen Needle (B-D ULTRAFINE III SHORT PEN) 31G X 8 MM MISC CHECK BLOOD SUGARS ONCE IN MORNING BEFORE FOOD AND ONCE IN THE EVENING.  . Lancets (ACCU-CHEK SOFT TOUCH) lancets  Use as instructed  . Semaglutide,0.25 or 0.5MG /DOS, (OZEMPIC, 0.25 OR 0.5 MG/DOSE,) 2 MG/1.5ML SOPN Inject 0.25 mg into the skin once a week.  . vitamin B-12 (CYANOCOBALAMIN) 1000 MCG tablet Take 1 tablet (1,000 mcg total) by mouth daily.     Allergies:   Aspirin   Social History   Socioeconomic History  . Marital status: Single    Spouse name: Not on file  . Number of children: 2  . Years of education: Not on file  . Highest education level: Not on file  Occupational History  . Occupation: Retired  Tobacco Use  . Smoking status: Current Every Day Smoker    Packs/day: 0.50    Years: 40.00    Pack years: 20.00    Types: Cigarettes  . Smokeless tobacco: Never Used  . Tobacco comment: down from 1.5 ppd  Substance and Sexual Activity  . Alcohol use: No    Alcohol/week: 0.0 standard drinks  . Drug use: No  . Sexual activity: Not on file  Other Topics Concern  . Not on file  Social History Narrative  . Not on file   Social Determinants of Health   Financial Resource Strain:   . Difficulty of Paying Living Expenses: Not on file  Food Insecurity:   . Worried About Charity fundraiser in the Last Year: Not on file  . Ran Out of Food in the Last Year: Not on file  Transportation Needs:   . Lack of Transportation (Medical): Not on file  . Lack of Transportation (Non-Medical): Not on file  Physical Activity:   . Days of Exercise per Week: Not on file  . Minutes of Exercise per Session: Not on file  Stress:   . Feeling of Stress : Not on file  Social Connections:   . Frequency of Communication with Friends and Family: Not on file  . Frequency of Social Gatherings with Friends and Family: Not on file  . Attends Religious Services: Not on file  . Active Member of Clubs or Organizations: Not on file  . Attends Archivist Meetings: Not on file  . Marital Status: Not on file     Family History: The patient's family history includes Cancer in her mother; Cancer (age  of onset: 52) in her sister; Diabetes in her brother, mother, sister, sister, and sister; Hypertension in her sister.  ROS:   Please see the history of present illness.     All other systems reviewed and are negative.  EKGs/Labs/Other Studies Reviewed:    The following studies were reviewed today:   EKG:  EKG is not ordered today.  The ekg ordered 12/13/2018 shows sinus rhythm, rate 74, mild diffuse ST depressions with aVR elevation  TTE 11/05/16: - Left ventricle: The cavity size was normal. Wall thickness was   increased in a pattern of moderate LVH. Systolic function was   normal. The estimated ejection fraction was in the range of 55%   to 60%. Wall motion was normal; there were no regional wall  motion abnormalities. Doppler parameters are consistent with   abnormal left ventricular relaxation (grade 1 diastolic   dysfunction). - Mitral valve: Calcified annulus. - Left atrium: The atrium was mildly dilated.  TTE 01/06/18: - Left ventricle: The cavity size was normal. There was mild   concentric hypertrophy. Systolic function was normal. The   estimated ejection fraction was in the range of 60% to 65%. Wall   motion was normal; there were no regional wall motion   abnormalities. Features are consistent with a pseudonormal left   ventricular filling pattern, with concomitant abnormal relaxation   and increased filling pressure (grade 2 diastolic dysfunction).   Doppler parameters are consistent with elevated ventricular   end-diastolic filling pressure. - Aortic valve: Trileaflet; mildly thickened, mildly calcified   leaflets. Transvalvular velocity was within the normal range.   There was no stenosis. There was mild regurgitation. Valve area   (VTI): 2.45 cm^2. Valve area (Vmax): 2.25 cm^2. Valve area   (Vmean): 2.24 cm^2. - Mitral valve: There was trivial regurgitation. - Left atrium: The atrium was mildly to moderately dilated. - Right ventricle: Systolic function was  normal. - Right atrium: The atrium was moderately dilated. - Tricuspid valve: Transvalvular velocity was within the normal   range. There was moderate regurgitation. - Pulmonary arteries: The main pulmonary artery was normal-sized.   Systolic pressure was severely increased. PA peak pressure: 75 mm   Hg (S).  TTE 12/13/18:  1. Left ventricular ejection fraction, by visual estimation, is 60 to 65%. The left ventricle has normal function. There is moderately increased left ventricular hypertrophy.  2. Left ventricular diastolic parameters are consistent with Grade II diastolic dysfunction (pseudonormalization).  3. The left ventricle has no regional wall motion abnormalities.  4. Global right ventricle has normal systolic function.The right ventricular size is normal. No increase in right ventricular wall thickness.  5. Left atrial size was mildly dilated.  6. Right atrial size was normal.  7. The mitral valve is normal in structure. No evidence of mitral valve regurgitation. No evidence of mitral stenosis.  8. The tricuspid valve is normal in structure. Tricuspid valve regurgitation is trivial.  9. The aortic valve is tricuspid. Aortic valve regurgitation is trivial. No evidence of aortic valve sclerosis or stenosis. 10. The tricuspid regurgitant velocity is 2.84 m/s, and with an assumed right atrial pressure of 15 mmHg, the estimated right ventricular systolic pressure is moderately elevated at 47.3 mmHg. 11. The inferior vena cava is dilated in size with <50% respiratory variability, suggesting right atrial pressure of 15 mmHg.  Recent Labs: 12/14/2018: Magnesium 2.2 12/23/2018: ALT 9; BUN 31; Creatinine 3.53; Hemoglobin 9.4; Platelet Count 241; Potassium 5.1; Sodium 142  Recent Lipid Panel    Component Value Date/Time   CHOL 85 11/24/2016 2016   TRIG 204 (H) 11/24/2016 2016   HDL 31 (L) 11/24/2016 2016   CHOLHDL 2.7 11/24/2016 2016   VLDL 41 (H) 11/24/2016 2016   LDLCALC 13  11/24/2016 2016    Physical Exam:    VS:  BP (!) 151/80   Pulse 79   Temp (!) 97 F (36.1 C)   Ht 5\' 6"  (1.676 m)   Wt 143 lb (64.9 kg)   SpO2 100%   BMI 23.08 kg/m     Wt Readings from Last 3 Encounters:  01/17/19 143 lb (64.9 kg)  12/23/18 154 lb 6.4 oz (70 kg)  12/20/18 154 lb 6.4 oz (70 kg)     GEN:  in no acute distress  HEENT: Normal NECK: No JVD; No carotid bruits LYMPHATICS: No lymphadenopathy CARDIAC: RRR, no murmurs, rubs, gallops RESPIRATORY:  Clear to auscultation without rales, wheezing or rhonchi  ABDOMEN: Soft, non-tender, non-distended MUSCULOSKELETAL:  No edema; No deformity  SKIN: Warm and dry NEUROLOGIC:  Alert and oriented x 3 PSYCHIATRIC:  Normal affect   ASSESSMENT:    1. Pulmonary HTN ([place])   2. Chronic heart failure with preserved ejection fraction ([place])   3. CKD (chronic kidney disease) stage 4, GFR 15-29 ml/min (HCC)   4. Essential hypertension    PLAN:     Pulmonary hypertension: RVSP 47 on recent echo.  However this was done while her hemoglobin was 4.7.  Her echo from 01/2018 also showed elevated pulmonary pressures, but again was done while hemoglobin was 4.5.  Elevated pressures could be due to to high output state from severe anemia.  Recommend repeating TTE since hemoglobin improved.  Can consider further work-up for pulmonary hypertension if pressures remain elevated with resolution of severe anemia.  No signs of right heart failure on exam.  HFpEF: Likely contributing to elevated pulmonary pressures as above.  Currently appears euvolemic  Anemia: Hemoglobin down to 4.7 during recent admission.  Likely due to both GI bleed and CKD stage IV.  Denies any bleeding since hospitalization.  Hemoglobin 9.4 on 12/23/2018  CKD stage IV: Follows with nephrology, creatinine 3.5 on 12/23/2018  Type 2 diabetes: On insulin.  A1c 7.8 on 12/12/2018  Hypertension: On carvedilol 25 mg twice daily and amlodipine 10 mg daily  Tobacco use: Encourage  cessation   RTC in 2 months   Medication Adjustments/Labs and Tests Ordered: Current medicines are reviewed at length with the patient today.  Concerns regarding medicines are outlined above.  Orders Placed This Encounter  Procedures  . ECHOCARDIOGRAM COMPLETE   No orders of the defined types were placed in this encounter.   Patient Instructions  Medication Instructions:  NO CHANGES    Lab Work: NOT NEEDED  Testing/Procedures: Will be schedule at [place] has requested that you have an echocardiogram. Echocardiography is a painless test that uses sound waves to create images of your heart. It provides your doctor with information about the size and shape of your heart and how well your heart's chambers and valves are working. This procedure takes approximately one hour. There are no restrictions for this procedure.   Follow-Up: At [facility], you and your health needs are our priority.  As part of our continuing mission to provide you with exceptional heart care, we have created designated Provider Care Teams.  These Care Teams include your primary Cardiologist (physician) and Advanced Practice Providers (APPs -  Physician Assistants and Nurse Practitioners) who all work together to provide you with the care you need, when you need it.  Your next appointment:   2 month(s)  The format for your next appointment:   In Person  Provider:   Oswaldo Milian, MD      Signed, Donato Heinz, MD  01/17/2019 5:43 PM    [place]

## 2019-01-17 ENCOUNTER — Other Ambulatory Visit: Payer: Self-pay

## 2019-01-17 ENCOUNTER — Encounter (INDEPENDENT_AMBULATORY_CARE_PROVIDER_SITE_OTHER): Payer: Self-pay

## 2019-01-17 ENCOUNTER — Ambulatory Visit (INDEPENDENT_AMBULATORY_CARE_PROVIDER_SITE_OTHER): Payer: Medicare HMO | Admitting: Cardiology

## 2019-01-17 VITALS — BP 151/80 | HR 79 | Temp 97.0°F | Ht 66.0 in | Wt 143.0 lb

## 2019-01-17 DIAGNOSIS — I10 Essential (primary) hypertension: Secondary | ICD-10-CM

## 2019-01-17 DIAGNOSIS — I272 Pulmonary hypertension, unspecified: Secondary | ICD-10-CM

## 2019-01-17 DIAGNOSIS — I5032 Chronic diastolic (congestive) heart failure: Secondary | ICD-10-CM | POA: Diagnosis not present

## 2019-01-17 DIAGNOSIS — N184 Chronic kidney disease, stage 4 (severe): Secondary | ICD-10-CM

## 2019-01-17 NOTE — Patient Instructions (Signed)
Medication Instructions:  NO CHANGES    Lab Work: NOT NEEDED  Testing/Procedures: Will be schedule at [place] has requested that you have an echocardiogram. Echocardiography is a painless test that uses sound waves to create images of your heart. It provides your doctor with information about the size and shape of your heart and how well your heart's chambers and valves are working. This procedure takes approximately one hour. There are no restrictions for this procedure.   Follow-Up: At [facility], you and your health needs are our priority.  As part of our continuing mission to provide you with exceptional heart care, we have created designated Provider Care Teams.  These Care Teams include your primary Cardiologist (physician) and Advanced Practice Providers (APPs -  Physician Assistants and Nurse Practitioners) who all work together to provide you with the care you need, when you need it.  Your next appointment:   2 month(s)  The format for your next appointment:   In Person  Provider:   Oswaldo Milian, MD

## 2019-01-31 ENCOUNTER — Other Ambulatory Visit (HOSPITAL_COMMUNITY): Payer: Medicare HMO

## 2019-01-31 ENCOUNTER — Telehealth: Payer: Self-pay | Admitting: Hematology and Oncology

## 2019-01-31 NOTE — Telephone Encounter (Signed)
Scheduled per 11/20 los. Called and spoke with pt, confirmed  2/24 appt

## 2019-02-08 ENCOUNTER — Ambulatory Visit (HOSPITAL_COMMUNITY): Payer: Medicare HMO | Attending: Cardiology

## 2019-02-08 ENCOUNTER — Other Ambulatory Visit: Payer: Self-pay

## 2019-02-08 DIAGNOSIS — I272 Pulmonary hypertension, unspecified: Secondary | ICD-10-CM

## 2019-02-27 ENCOUNTER — Telehealth (HOSPITAL_COMMUNITY): Payer: Self-pay

## 2019-02-27 NOTE — Telephone Encounter (Signed)

## 2019-02-28 ENCOUNTER — Ambulatory Visit (INDEPENDENT_AMBULATORY_CARE_PROVIDER_SITE_OTHER): Payer: Medicare HMO | Admitting: Vascular Surgery

## 2019-02-28 ENCOUNTER — Encounter: Payer: Self-pay | Admitting: Vascular Surgery

## 2019-02-28 ENCOUNTER — Other Ambulatory Visit: Payer: Self-pay

## 2019-02-28 ENCOUNTER — Ambulatory Visit (HOSPITAL_COMMUNITY)
Admission: RE | Admit: 2019-02-28 | Discharge: 2019-02-28 | Disposition: A | Discharge status: Home / Self Care | Payer: Medicare HMO | Source: Ambulatory Visit | Attending: Surgery | Admitting: Surgery

## 2019-02-28 ENCOUNTER — Ambulatory Visit (INDEPENDENT_AMBULATORY_CARE_PROVIDER_SITE_OTHER)
Admission: RE | Admit: 2019-02-28 | Discharge: 2019-02-28 | Disposition: A | Discharge status: Home / Self Care | Payer: Medicare HMO | Source: Ambulatory Visit | Attending: Surgery | Admitting: Surgery

## 2019-02-28 VITALS — BP 188/95 | HR 75 | Temp 97.4°F | Resp 20 | Ht 66.0 in | Wt 138.0 lb

## 2019-02-28 DIAGNOSIS — N186 End stage renal disease: Secondary | ICD-10-CM

## 2019-02-28 DIAGNOSIS — N184 Chronic kidney disease, stage 4 (severe): Secondary | ICD-10-CM | POA: Diagnosis not present

## 2019-02-28 NOTE — Progress Notes (Signed)
Vascular and Vein Specialist of [place]  Patient name: Sara Davis MRN: [id number] DOB: Sep 02, 1950 Sex: female  REASON FOR CONSULT: Discuss access for hemodialysis  HPI: Sara Davis is a 69 y.o. female, who is here today for discussion of access for hemodialysis.  She has CKD 4.  We have been asked to place a fistula if she is a candidate and wait for Gore-Tex graft placement if she is not a fistula candidate.  She has a history of noncompliance and has very poor understanding of her health care issues.  She does have a family member who is on dialysis and has some understanding of what is involved.  She is not willing to consent to hemodialysis currently.  She has questions regarding symptoms and I reviewed these with her.  She is left-handed.  Past Medical History:  Diagnosis Date  . Cellulitis of scalp 02/2015  . Chronic kidney disease   . Depression   . Diabetes mellitus without complication ([place])   . Hyperlipidemia   . Hypertension     Family History  Problem Relation Age of Onset  . Diabetes Mother   . Cancer Mother   . Diabetes Sister   . Hypertension Sister   . Cancer Sister 43       Bone  . Diabetes Brother   . Diabetes Sister   . Diabetes Sister     SOCIAL HISTORY: Social History   Socioeconomic History  . Marital status: Single    Spouse name: Not on file  . Number of children: 2  . Years of education: Not on file  . Highest education level: Not on file  Occupational History  . Occupation: Retired  Tobacco Use  . Smoking status: Current Every Day Smoker    Packs/day: 0.50    Years: 40.00    Pack years: 20.00    Types: Cigarettes  . Smokeless tobacco: Never Used  . Tobacco comment: 6 cigs per day  Substance and Sexual Activity  . Alcohol use: No    Alcohol/week: 0.0 standard drinks  . Drug use: No  . Sexual activity: Not on file  Other Topics Concern  . Not on file  Social History Narrative  . Not on  file   Social Determinants of Health   Financial Resource Strain:   . Difficulty of Paying Living Expenses: Not on file  Food Insecurity:   . Worried About Charity fundraiser in the Last Year: Not on file  . Ran Out of Food in the Last Year: Not on file  Transportation Needs:   . Lack of Transportation (Medical): Not on file  . Lack of Transportation (Non-Medical): Not on file  Physical Activity:   . Days of Exercise per Week: Not on file  . Minutes of Exercise per Session: Not on file  Stress:   . Feeling of Stress : Not on file  Social Connections:   . Frequency of Communication with Friends and Family: Not on file  . Frequency of Social Gatherings with Friends and Family: Not on file  . Attends Religious Services: Not on file  . Active Member of Clubs or Organizations: Not on file  . Attends Archivist Meetings: Not on file  . Marital Status: Not on file  Intimate Partner Violence:   . Fear of Current or Ex-Partner: Not on file  . Emotionally Abused: Not on file  . Physically Abused: Not on file  . Sexually Abused: Not on file  Allergies  Allergen Reactions  . Aspirin Other (See Comments)    Upset stomach, can only take coated aspirin but prefers to not take it at all    Current Outpatient Medications  Medication Sig Dispense Refill  . Alcohol Swabs (B-D SINGLE USE SWABS REGULAR) PADS 1 Units by Does not apply route 3 (three) times daily as needed. 90 each 2  . amLODipine (NORVASC) 10 MG tablet Take 1 tablet (10 mg total) by mouth daily. 90 tablet 1  . atorvastatin (LIPITOR) 80 MG tablet Take 1 tablet (80 mg total) by mouth daily. 90 tablet 1  . Blood Glucose Calibration (ACCU-CHEK AVIVA) SOLN 1 Units by In Vitro route 3 (three) times daily as needed. 90 each 0  . Blood Glucose Monitoring Suppl (ACCU-CHEK AVIVA) device Use as instructed 1 each 0  . carvedilol (COREG) 25 MG tablet Take 1 tablet (25 mg total) by mouth 2 (two) times daily. 180 tablet 3  .  ferrous sulfate 325 (65 FE) MG tablet Take 1 tablet (325 mg total) by mouth every other day. 45 tablet 1  . glucose blood (ACCU-CHEK AVIVA PLUS) test strip Use as instructed 100 each 12  . insulin NPH-regular Human (70-30) 100 UNIT/ML injection Inject 22 Units into the skin 2 (two) times daily. (Patient taking differently: Inject 20 Units into the skin 2 (two) times daily. ) 1 vial 2  . Insulin Pen Needle (B-D ULTRAFINE III SHORT PEN) 31G X 8 MM MISC CHECK BLOOD SUGARS ONCE IN MORNING BEFORE FOOD AND ONCE IN THE EVENING. 100 each 0  . Lancets (ACCU-CHEK SOFT TOUCH) lancets Use as instructed 100 each 12  . Semaglutide,0.25 or 0.5MG /DOS, (OZEMPIC, 0.25 OR 0.5 MG/DOSE,) 2 MG/1.5ML SOPN Inject 0.25 mg into the skin once a week. 1 pen 0  . vitamin B-12 (CYANOCOBALAMIN) 1000 MCG tablet Take 1 tablet (1,000 mcg total) by mouth daily. 90 tablet 3   No current facility-administered medications for this visit.    REVIEW OF SYSTEMS:  [X]  denotes positive finding, [ ]  denotes negative finding Cardiac  Comments:  Chest pain or chest pressure:    Shortness of breath upon exertion:    Short of breath when lying flat:    Irregular heart rhythm:        Vascular    Pain in calf, thigh, or hip brought on by ambulation: x   Pain in feet at night that wakes you up from your sleep:     Blood clot in your veins:    Leg swelling:  x       Pulmonary    Oxygen at home:    Productive cough:     Wheezing:         Neurologic    Sudden weakness in arms or legs:     Sudden numbness in arms or legs:     Sudden onset of difficulty speaking or slurred speech:    Temporary loss of vision in one eye:     Problems with dizziness:         Gastrointestinal    Blood in stool:     Vomited blood:         Genitourinary    Burning when urinating:     Blood in urine:        Psychiatric    Major depression:         Hematologic    Bleeding problems:    Problems with blood clotting too easily:  Skin      Rashes or ulcers:        Constitutional    Fever or chills:      PHYSICAL EXAM: Vitals:   02/28/19 1339  BP: (!) 188/95  Pulse: 75  Resp: 20  Temp: (!) 97.4 F (36.3 C)  SpO2: 100%  Weight: 138 lb (62.6 kg)  Height: 5\' 6"  (1.676 m)    GENERAL: The patient is a well-nourished female, in no acute distress. The vital signs are documented above. CARDIOVASCULAR: Palpable radial pulses bilaterally.  Extremely small surface veins by physical exam bilaterally. PULMONARY: There is good air exchange  ABDOMEN: Soft and non-tender  MUSCULOSKELETAL: There are no major deformities or cyanosis. NEUROLOGIC: No focal weakness or paresthesias are detected. SKIN: There are no ulcers or rashes noted. PSYCHIATRIC: The patient has a normal affect.  DATA:  Arterial duplex today reveals normal arterial flow to the radial and ulnar level  Vein map shows extremely small or absent visualization of her cephalic vein bilaterally.  There is some thromboses in this superficial cephalic vein as well  MEDICAL ISSUES: I discussed options with patient regarding hemodialysis to include tunneled catheter, AV fistula and AV graft.  She is not a candidate for AV fistula.  I did explain that we would recommend right upper arm AV graft placement if she progresses to needing hemodialysis.  He will continue to follow-up with [facility] and we are available for graft placement at the appropriate time   Rosetta Posner, MD [facility] Vascular and Vein Specialists of [facility] Tel [phone] Pager [phone]

## 2019-03-09 ENCOUNTER — Ambulatory Visit
Admission: RE | Admit: 2019-03-09 | Discharge: 2019-03-09 | Disposition: A | Discharge status: Home / Self Care | Payer: Medicare HMO | Source: Ambulatory Visit | Attending: *Deleted | Admitting: *Deleted

## 2019-03-09 ENCOUNTER — Other Ambulatory Visit: Payer: Self-pay

## 2019-03-09 DIAGNOSIS — Z780 Asymptomatic menopausal state: Secondary | ICD-10-CM | POA: Diagnosis not present

## 2019-03-09 DIAGNOSIS — Z1231 Encounter for screening mammogram for malignant neoplasm of breast: Secondary | ICD-10-CM | POA: Diagnosis not present

## 2019-03-09 DIAGNOSIS — E2839 Other primary ovarian failure: Secondary | ICD-10-CM

## 2019-03-09 DIAGNOSIS — M8589 Other specified disorders of bone density and structure, multiple sites: Secondary | ICD-10-CM | POA: Diagnosis not present

## 2019-03-14 ENCOUNTER — Ambulatory Visit: Payer: Medicare HMO | Admitting: Family Medicine

## 2019-03-14 NOTE — Progress Notes (Deleted)
   CHIEF COMPLAINT / HPI: Diabetes Follow Up:  ***  PERTINENT  PMH / PSH: ***   OBJECTIVE: There were no vitals taken for this visit.  ***  ASSESSMENT / PLAN:  No problem-specific Assessment & Plan notes found for this encounter.     Wilber Oliphant, MD [place]

## 2019-03-21 ENCOUNTER — Ambulatory Visit: Payer: Medicare HMO | Admitting: Cardiology

## 2019-03-21 NOTE — Progress Notes (Deleted)
Cardiology Office Note:    Date:  03/21/2019   ID:  Aimee, Heldman 1950/03/25, MRN [id number]  PCP:  [name], [name], DO  Cardiologist:  Donato Heinz, MD  Electrophysiologist:  None   Referring MD: [name], [name], DO   No chief complaint on file.   History of Present Illness:    Sara Davis is a 69 y.o. female with a hx of HFpEF, COPD, type 2 diabetes, CKD stage IV, hypertension who presents for follow-up of pulmonary hypertension.  Was admitted from 12/13/2018 through 12/16/2018.  She had presented with dyspnea on exertion and found to have hemoglobin 4.7.  GI was consulted, underwent an EGD which showed mild esophagitis and multiple duodenal AVMs that were treated endoscopically with hemostatic therapy.  Also found to have AKI on CKD (creatinine 3.6), with improvement to 2.9 on discharge.  TTE showed pulmonary hypertension so was referred to cardiology as an outpatient.  Repeat TTE on 02/08/2019 showed normal pulmonary pressures.   Past Medical History:  Diagnosis Date  . Cellulitis of scalp 02/2015  . Chronic kidney disease   . Depression   . Diabetes mellitus without complication ([place])   . Hyperlipidemia   . Hypertension     Past Surgical History:  Procedure Laterality Date  . ABDOMINAL HYSTERECTOMY    . BIOPSY  01/07/2018   Procedure: BIOPSY;  Surgeon: Irene Shipper, MD;  Location: [facility] ENDOSCOPY;  Service: Endoscopy;;  Clotest  . COLONOSCOPY WITH PROPOFOL N/A 01/07/2018   Procedure: COLONOSCOPY WITH PROPOFOL;  Surgeon: Irene Shipper, MD;  Location: [facility] ENDOSCOPY;  Service: Endoscopy;  Laterality: N/A;  . ESOPHAGOGASTRODUODENOSCOPY (EGD) WITH PROPOFOL N/A 01/07/2018   Procedure: ESOPHAGOGASTRODUODENOSCOPY (EGD) WITH PROPOFOL;  Surgeon: Irene Shipper, MD;  Location: [facility] ENDOSCOPY;  Service: Endoscopy;  Laterality: N/A;  . ESOPHAGOGASTRODUODENOSCOPY (EGD) WITH PROPOFOL N/A 12/14/2018   Procedure: ESOPHAGOGASTRODUODENOSCOPY (EGD) WITH PROPOFOL;  Surgeon: Irene Shipper, MD;  Location: [facility] ENDOSCOPY;  Service: Endoscopy;  Laterality: N/A;  . HEMOSTASIS CONTROL  12/14/2018   Procedure: HEMOSTASIS CONTROL;  Surgeon: Irene Shipper, MD;  Location: [facility] ENDOSCOPY;  Service: Endoscopy;;  . POLYPECTOMY  01/07/2018   Procedure: POLYPECTOMY;  Surgeon: Irene Shipper, MD;  Location: [facility] ENDOSCOPY;  Service: Endoscopy;;    Current Medications: No outpatient medications have been marked as taking for the 03/21/19 encounter (Appointment) with Donato Heinz, MD.     Allergies:   Aspirin   Social History   Socioeconomic History  . Marital status: Single    Spouse name: Not on file  . Number of children: 2  . Years of education: Not on file  . Highest education level: Not on file  Occupational History  . Occupation: Retired  Tobacco Use  . Smoking status: Current Every Day Smoker    Packs/day: 0.50    Years: 40.00    Pack years: 20.00    Types: Cigarettes  . Smokeless tobacco: Never Used  . Tobacco comment: 6 cigs per day  Substance and Sexual Activity  . Alcohol use: No    Alcohol/week: 0.0 standard drinks  . Drug use: No  . Sexual activity: Not on file  Other Topics Concern  . Not on file  Social History Narrative  . Not on file   Social Determinants of Health   Financial Resource Strain:   . Difficulty of Paying Living Expenses: Not on file  Food Insecurity:   . Worried About Charity fundraiser in the Last Year: Not  on file  . Ran Out of Food in the Last Year: Not on file  Transportation Needs:   . Lack of Transportation (Medical): Not on file  . Lack of Transportation (Non-Medical): Not on file  Physical Activity:   . Days of Exercise per Week: Not on file  . Minutes of Exercise per Session: Not on file  Stress:   . Feeling of Stress : Not on file  Social Connections:   . Frequency of Communication with Friends and Family: Not on file  . Frequency of Social Gatherings with Friends and Family: Not on file  . Attends Religious  Services: Not on file  . Active Member of Clubs or Organizations: Not on file  . Attends Archivist Meetings: Not on file  . Marital Status: Not on file     Family History: The patient's family history includes Cancer in her mother; Cancer (age of onset: 81) in her sister; Diabetes in her brother, mother, sister, sister, and sister; Hypertension in her sister.  ROS:   Please see the history of present illness.     All other systems reviewed and are negative.  EKGs/Labs/Other Studies Reviewed:    The following studies were reviewed today:   EKG:  EKG is not ordered today.  The ekg ordered 12/13/2018 shows sinus rhythm, rate 74, mild diffuse ST depressions with aVR elevation  TTE 11/05/16: - Left ventricle: The cavity size was normal. Wall thickness was   increased in a pattern of moderate LVH. Systolic function was   normal. The estimated ejection fraction was in the range of 55%   to 60%. Wall motion was normal; there were no regional wall   motion abnormalities. Doppler parameters are consistent with   abnormal left ventricular relaxation (grade 1 diastolic   dysfunction). - Mitral valve: Calcified annulus. - Left atrium: The atrium was mildly dilated.  TTE 01/06/18: - Left ventricle: The cavity size was normal. There was mild   concentric hypertrophy. Systolic function was normal. The   estimated ejection fraction was in the range of 60% to 65%. Wall   motion was normal; there were no regional wall motion   abnormalities. Features are consistent with a pseudonormal left   ventricular filling pattern, with concomitant abnormal relaxation   and increased filling pressure (grade 2 diastolic dysfunction).   Doppler parameters are consistent with elevated ventricular   end-diastolic filling pressure. - Aortic valve: Trileaflet; mildly thickened, mildly calcified   leaflets. Transvalvular velocity was within the normal range.   There was no stenosis. There was mild  regurgitation. Valve area   (VTI): 2.45 cm^2. Valve area (Vmax): 2.25 cm^2. Valve area   (Vmean): 2.24 cm^2. - Mitral valve: There was trivial regurgitation. - Left atrium: The atrium was mildly to moderately dilated. - Right ventricle: Systolic function was normal. - Right atrium: The atrium was moderately dilated. - Tricuspid valve: Transvalvular velocity was within the normal   range. There was moderate regurgitation. - Pulmonary arteries: The main pulmonary artery was normal-sized.   Systolic pressure was severely increased. PA peak pressure: 75 mm   Hg (S).  TTE 12/13/18:  1. Left ventricular ejection fraction, by visual estimation, is 60 to 65%. The left ventricle has normal function. There is moderately increased left ventricular hypertrophy.  2. Left ventricular diastolic parameters are consistent with Grade II diastolic dysfunction (pseudonormalization).  3. The left ventricle has no regional wall motion abnormalities.  4. Global right ventricle has normal systolic function.The right ventricular size  is normal. No increase in right ventricular wall thickness.  5. Left atrial size was mildly dilated.  6. Right atrial size was normal.  7. The mitral valve is normal in structure. No evidence of mitral valve regurgitation. No evidence of mitral stenosis.  8. The tricuspid valve is normal in structure. Tricuspid valve regurgitation is trivial.  9. The aortic valve is tricuspid. Aortic valve regurgitation is trivial. No evidence of aortic valve sclerosis or stenosis. 10. The tricuspid regurgitant velocity is 2.84 m/s, and with an assumed right atrial pressure of 15 mmHg, the estimated right ventricular systolic pressure is moderately elevated at 47.3 mmHg. 11. The inferior vena cava is dilated in size with <50% respiratory variability, suggesting right atrial pressure of 15 mmHg.  TTE 02/08/19: 1. Left ventricular ejection fraction, by visual estimation, is 55 to  60%. The left ventricle  has normal function. There is moderately increased  left ventricular hypertrophy.  2. Left ventricular diastolic parameters are consistent with Grade I  diastolic dysfunction (impaired relaxation).  3. The left ventricle has no regional wall motion abnormalities.  4. Global right ventricle has normal systolic function.The right  ventricular size is normal. No increase in right ventricular wall  thickness.  5. Left atrial size was normal.  6. Right atrial size was normal.  7. Presence of pericardial fat pad.  8. Trivial pericardial effusion is present.  9. The mitral valve is grossly normal. Trivial mitral valve  regurgitation.  10. The tricuspid valve is grossly normal.  11. The aortic valve is tricuspid. Aortic valve regurgitation is not  visualized. No evidence of aortic valve sclerosis or stenosis.  12. Pulmonic regurgitation is mild.  13. The pulmonic valve was grossly normal. Pulmonic valve regurgitation is  mild.  14. Normal pulmonary artery systolic pressure.  15. The tricuspid regurgitant velocity is 2.36 m/s, and with an assumed  right atrial pressure of 3 mmHg, the estimated right ventricular systolic  pressure is normal at 25.2 mmHg.  16. The inferior vena cava is normal in size with greater than 50%  respiratory variability, suggesting right atrial pressure of 3 mmHg.  17. A prior study was performed on 12/13/2018.  18. Changes from prior study are noted.  19. Grade I DD now present. Normal RVSP.   Recent Labs: 12/14/2018: Magnesium 2.2 12/23/2018: ALT 9; BUN 31; Creatinine 3.53; Hemoglobin 9.4; Platelet Count 241; Potassium 5.1; Sodium 142  Recent Lipid Panel    Component Value Date/Time   CHOL 85 11/24/2016 2016   TRIG 204 (H) 11/24/2016 2016   HDL 31 (L) 11/24/2016 2016   CHOLHDL 2.7 11/24/2016 2016   VLDL 41 (H) 11/24/2016 2016   LDLCALC 13 11/24/2016 2016    Physical Exam:    VS:  There were no vitals taken for this visit.    Wt Readings from  Last 3 Encounters:  02/28/19 138 lb (62.6 kg)  01/17/19 143 lb (64.9 kg)  12/23/18 154 lb 6.4 oz (70 kg)     GEN:  in no acute distress HEENT: Normal NECK: No JVD; No carotid bruits LYMPHATICS: No lymphadenopathy CARDIAC: RRR, no murmurs, rubs, gallops RESPIRATORY:  Clear to auscultation without rales, wheezing or rhonchi  ABDOMEN: Soft, non-tender, non-distended MUSCULOSKELETAL:  No edema; No deformity  SKIN: Warm and dry NEUROLOGIC:  Alert and oriented x 3 PSYCHIATRIC:  Normal affect   ASSESSMENT:    No diagnosis found. PLAN:     Pulmonary hypertension: RVSP 47 on recent echo 12/2018.  However this was done  while her hemoglobin was 4.7.  Her echo from 01/2018 also showed elevated pulmonary pressures, but again was done while hemoglobin was 4.5.  Suspect elevated pressures due to to high output state from severe anemia.  Repeat TTE once hemoglobin normalized on 02/08/2019 showed normal pulmonary pressures.  HFpEF: Likely contributing to elevated pulmonary pressures as above.  Currently appears euvolemic  Anemia: Hemoglobin down to 4.7 during recent admission.  Likely due to both GI bleed and CKD stage IV.  Denies any bleeding since hospitalization.  Hemoglobin 9.4 on 12/23/2018  CKD stage IV: Follows with nephrology, creatinine 3.5 on 12/23/2018  Type 2 diabetes: On insulin.  A1c 7.8 on 12/12/2018  Hypertension: On carvedilol 25 mg twice daily and amlodipine 10 mg daily  Tobacco use: Encourage cessation   RTC in ***  Medication Adjustments/Labs and Tests Ordered: Current medicines are reviewed at length with the patient today.  Concerns regarding medicines are outlined above.  No orders of the defined types were placed in this encounter.  No orders of the defined types were placed in this encounter.   There are no Patient Instructions on file for this visit.   Signed, Donato Heinz, MD  03/21/2019 1:03 AM    [place]

## 2019-03-28 ENCOUNTER — Other Ambulatory Visit: Payer: Self-pay | Admitting: Hematology and Oncology

## 2019-03-28 DIAGNOSIS — D500 Iron deficiency anemia secondary to blood loss (chronic): Secondary | ICD-10-CM

## 2019-03-28 NOTE — Progress Notes (Deleted)
[place] [phone]   [phone]  PROGRESS NOTE  Patient Care Team: [name], [name], DO as PCP - General (Family Medicine) Donato Heinz, MD as PCP - Cardiology (Cardiology)  Hematological/Oncological History #Normocytic Anemia 1) 01/24/2009: Hgb 14.6, last normal on record 2) 10/13/2014: Hgb 8.4  3) 01/05/2018: 3 units of PRBC for symptomatic anemia of 4.6. Endoscopy was performed and found nonbleeding gastric ulcers, confirmed diagnosis of H. Pylori. 4) 02/23/2018: Hgb 10.3 5) 12/13/2018: Hgb 4.7, 3 units of PRBC for symptomatic anemia. 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 Iron24, TIBC 395, ferritin 7, Sat 6%.  6) 12/14/2018:  500mg Feraheme administered. 7) 12/20/2018: started PO iron 325mg  QOD  8) 12/23/2018: Establish care with Dr. Lorenso Courier  9) 01/02/2019: MMA returned elevated, started patient on 1038mcg of Vitamin B12  Interval History:  Sara Davis 69 y.o. female with medical history significant for normocytic anemia presents for a follow up visit. The patient's last visit was on 12/23/2018. In the interim since the last visit the patient established with the nephrology service.   On exam today ***  MEDICAL HISTORY:  Past Medical History:  Diagnosis Date   Cellulitis of scalp 02/2015   Chronic kidney disease    Depression    Diabetes mellitus without complication ([place])    Hyperlipidemia    Hypertension     SURGICAL HISTORY: Past Surgical History:  Procedure Laterality Date   ABDOMINAL HYSTERECTOMY     BIOPSY  01/07/2018   Procedure: BIOPSY;  Surgeon: Irene Shipper, MD;  Location: [facility] ENDOSCOPY;  Service: Endoscopy;;  Clotest   COLONOSCOPY WITH PROPOFOL N/A 01/07/2018   Procedure: COLONOSCOPY WITH PROPOFOL;  Surgeon: Irene Shipper, MD;  Location: [facility] ENDOSCOPY;  Service: Endoscopy;  Laterality: N/A;   ESOPHAGOGASTRODUODENOSCOPY (EGD) WITH PROPOFOL N/A  01/07/2018   Procedure: ESOPHAGOGASTRODUODENOSCOPY (EGD) WITH PROPOFOL;  Surgeon: Irene Shipper, MD;  Location: [facility] ENDOSCOPY;  Service: Endoscopy;  Laterality: N/A;   ESOPHAGOGASTRODUODENOSCOPY (EGD) WITH PROPOFOL N/A 12/14/2018   Procedure: ESOPHAGOGASTRODUODENOSCOPY (EGD) WITH PROPOFOL;  Surgeon: Irene Shipper, MD;  Location: [facility] ENDOSCOPY;  Service: Endoscopy;  Laterality: N/A;   HEMOSTASIS CONTROL  12/14/2018   Procedure: HEMOSTASIS CONTROL;  Surgeon: Irene Shipper, MD;  Location: [facility] ENDOSCOPY;  Service: Endoscopy;;   POLYPECTOMY  01/07/2018   Procedure: POLYPECTOMY;  Surgeon: Irene Shipper, MD;  Location: [facility] ENDOSCOPY;  Service: Endoscopy;;    SOCIAL HISTORY: Social History   Socioeconomic History   Marital status: Single    Spouse name: Not on file   Number of children: 2   Years of education: Not on file   Highest education level: Not on file  Occupational History   Occupation: Retired  Tobacco Use   Smoking status: Current Every Day Smoker    Packs/day: 0.50    Years: 40.00    Pack years: 20.00    Types: Cigarettes   Smokeless tobacco: Never Used   Tobacco comment: 6 cigs per day  Substance and Sexual Activity   Alcohol use: No    Alcohol/week: 0.0 standard drinks   Drug use: No   Sexual activity: Not on file  Other Topics Concern   Not on file  Social History Narrative   Not on file   Social Determinants of Health   Financial Resource Strain:    Difficulty of Paying Living Expenses: Not on file  Food Insecurity:    Worried About [place] in the Last  Year: Not on file   Ran Out of Food in the Last Year: Not on file  Transportation Needs:    Lack of Transportation (Medical): Not on file   Lack of Transportation (Non-Medical): Not on file  Physical Activity:    Days of Exercise per Week: Not on file   Minutes of Exercise per Session: Not on file  Stress:    Feeling of Stress : Not on file  Social Connections:    Frequency  of Communication with Friends and Family: Not on file   Frequency of Social Gatherings with Friends and Family: Not on file   Attends Religious Services: Not on file   Active Member of Clubs or Organizations: Not on file   Attends Archivist Meetings: Not on file   Marital Status: Not on file  Intimate Partner Violence:    Fear of Current or Ex-Partner: Not on file   Emotionally Abused: Not on file   Physically Abused: Not on file   Sexually Abused: Not on file    FAMILY HISTORY: Family History  Problem Relation Age of Onset   Diabetes Mother    Cancer Mother    Diabetes Sister    Hypertension Sister    Cancer Sister 37       Bone   Diabetes Brother    Diabetes Sister    Diabetes Sister     ALLERGIES:  is allergic to aspirin.  MEDICATIONS:  Current Outpatient Medications  Medication Sig Dispense Refill   Alcohol Swabs (B-D SINGLE USE SWABS REGULAR) PADS 1 Units by Does not apply route 3 (three) times daily as needed. 90 each 2   amLODipine (NORVASC) 10 MG tablet Take 1 tablet (10 mg total) by mouth daily. 90 tablet 1   atorvastatin (LIPITOR) 80 MG tablet Take 1 tablet (80 mg total) by mouth daily. 90 tablet 1   Blood Glucose Calibration (ACCU-CHEK AVIVA) SOLN 1 Units by In Vitro route 3 (three) times daily as needed. 90 each 0   Blood Glucose Monitoring Suppl (ACCU-CHEK AVIVA) device Use as instructed 1 each 0   carvedilol (COREG) 25 MG tablet Take 1 tablet (25 mg total) by mouth 2 (two) times daily. 180 tablet 3   ferrous sulfate 325 (65 FE) MG tablet Take 1 tablet (325 mg total) by mouth every other day. 45 tablet 1   glucose blood (ACCU-CHEK AVIVA PLUS) test strip Use as instructed 100 each 12   insulin NPH-regular Human (70-30) 100 UNIT/ML injection Inject 22 Units into the skin 2 (two) times daily. (Patient taking differently: Inject 20 Units into the skin 2 (two) times daily. ) 1 vial 2   Insulin Pen Needle (B-D ULTRAFINE III SHORT  PEN) 31G X 8 MM MISC CHECK BLOOD SUGARS ONCE IN MORNING BEFORE FOOD AND ONCE IN THE EVENING. 100 each 0   Lancets (ACCU-CHEK SOFT TOUCH) lancets Use as instructed 100 each 12   Semaglutide,0.25 or 0.5MG /DOS, (OZEMPIC, 0.25 OR 0.5 MG/DOSE,) 2 MG/1.5ML SOPN Inject 0.25 mg into the skin once a week. 1 pen 0   vitamin B-12 (CYANOCOBALAMIN) 1000 MCG tablet Take 1 tablet (1,000 mcg total) by mouth daily. 90 tablet 3   No current facility-administered medications for this visit.    REVIEW OF SYSTEMS:   Constitutional: ( - ) fevers, ( - )  chills , ( - ) night sweats Eyes: ( - ) blurriness of vision, ( - ) double vision, ( - ) watery eyes Ears, nose, mouth, throat, and  face: ( - ) mucositis, ( - ) sore throat Respiratory: ( - ) cough, ( - ) dyspnea, ( - ) wheezes Cardiovascular: ( - ) palpitation, ( - ) chest discomfort, ( - ) lower extremity swelling Gastrointestinal:  ( - ) nausea, ( - ) heartburn, ( - ) change in bowel habits Skin: ( - ) abnormal skin rashes Lymphatics: ( - ) new lymphadenopathy, ( - ) easy bruising Neurological: ( - ) numbness, ( - ) tingling, ( - ) new weaknesses Behavioral/Psych: ( - ) mood change, ( - ) new changes  All other systems were reviewed with the patient and are negative.  PHYSICAL EXAMINATION: ECOG PERFORMANCE STATUS: {CHL ONC ECOG PS:[id number]}  There were no vitals filed for this visit. There were no vitals filed for this visit.  GENERAL: alert, no distress and comfortable SKIN: skin color, texture, turgor are normal, no rashes or significant lesions EYES: conjunctiva are pink and non-injected, sclera clear OROPHARYNX: no exudate, no erythema; lips, buccal mucosa, and tongue normal  NECK: supple, non-tender LYMPH:  no palpable lymphadenopathy in the cervical, axillary or inguinal LUNGS: clear to auscultation and percussion with normal breathing effort HEART: regular rate & rhythm and no murmurs and no lower extremity edema ABDOMEN: soft, non-tender,  non-distended, normal bowel sounds Musculoskeletal: no cyanosis of digits and no clubbing  PSYCH: alert & oriented x 3, fluent speech NEURO: no focal motor/sensory deficits  LABORATORY DATA:  I have reviewed the data as listed CBC Latest Ref Rng & Units 12/23/2018 12/20/2018 12/15/2018  WBC 4.0 - 10.5 K/uL 9.8 8.0 8.8  Hemoglobin 12.0 - 15.0 g/dL 9.4(L) 9.3(L) 8.9(L)  Hematocrit 36.0 - 46.0 % 29.5(L) 28.6(L) 27.8(L)  Platelets 150 - 400 K/uL 241 234 252    CMP Latest Ref Rng & Units 12/23/2018 12/20/2018 12/16/2018  Glucose 70 - 99 mg/dL 226(H) 92 219(H)  BUN 8 - 23 mg/dL 31(H) 32(H) 28(H)  Creatinine 0.44 - 1.00 mg/dL 3.53(HH) 3.27(H) 2.92(H)  Sodium 135 - 145 mmol/L 142 143 141  Potassium 3.5 - 5.1 mmol/L 5.1 4.6 3.4(L)  Chloride 98 - 111 mmol/L 117(H) 113(H) 114(H)  CO2 22 - 32 mmol/L 18(L) 14(L) 17(L)  Calcium 8.9 - 10.3 mg/dL 9.4 9.6 8.8(L)  Total Protein 6.5 - 8.1 g/dL 6.2(L) - -  Total Bilirubin 0.3 - 1.2 mg/dL 0.3 - -  Alkaline Phos 38 - 126 U/L 129(H) - -  AST 15 - 41 U/L 12(L) - -  ALT 0 - 44 U/L 9 - -     BLOOD FILM: *** Review of the peripheral blood smear showed normal appearing white cells with neutrophils that were appropriately lobated and granulated. There was no predominance of bi-lobed or hyper-segmented neutrophils appreciated. No Dohle bodies were noted. There was no left shifting, immature forms or blasts noted. Lymphocytes remain normal in size without any predominance of large granular lymphocytes. Red cells show no anisopoikilocytosis, macrocytes , microcytes or polychromasia. There were no schistocytes, target cells, echinocytes, acanthocytes, dacrocytes, or stomatocytes.There was no rouleaux formation, nucleated red cells, or intra-cellular inclusions noted. The platelets are normal in size, shape, and color without any clumping evident.  RADIOGRAPHIC STUDIES: I have personally reviewed the radiological images as listed and agreed with the findings in the  report. DG BONE DENSITY (DXA)  Result Date: 03/09/2019 EXAM: DUAL X-RAY ABSORPTIOMETRY (DXA) FOR BONE MINERAL DENSITY IMPRESSION: Referring Physician:  [name] [name] Your patient completed a BMD test using Lunar IDXA DXA system ( analysis version: 16 )  manufactured by [employer]. Technologist: CG PATIENT: Name: Orly, Quimby Patient ID: [id number] Birth Date: [birth year] Height: 66.0 in. Sex: Female Measured: 03/09/2019 Weight: 138.8 lbs. Indications: Bilateral Ovariectomy (65.51), Estrogen Deficient, Hysterectomy, Insulin for Diabetes, Postmenopausal, Tobacco User (Current Smoker) Fractures: None Treatments: Calcium (E943.0), Vitamin D (E933.5) ASSESSMENT: The BMD measured at Femur Total Right is 0.726 g/cm2 with a T-score of -2.2. This patient is considered OSTEOPENIC according to [place] [facility]) criteria. The scan quality is good. L4 was excluded due to  degenerative changes. Site Region Measured Date Measured Age YA T-score BMD Significant CHANGE AP Spine  L1-L3       [age]2018    [age]         -1.9    0.957 g/cm2 DualFemur Total Right 4/09/2019    67.3         -2.3    0.726 g/cm2 DualFemur Total Mean  4/09[birth year]2019    67.3         -2.3    0.727 g/cm2 World Health Organization [facility]) criteria for post-menopausal, Caucasian Women: Normal       T-score at or above -1 SD Osteopenia   T-score between -1 and -2.5 SD Osteoporosis T-score at or below -2.5 SD RECOMMENDATION: 1. All patients should optimize calcium and vitamin D intake. 2. Consider FDA approved medical therapies in postmenopausal women and men aged 68 years and older, based on the following: a. A hip or vertebral (clinical or morphometric) fracture b. T- score < or = -2.5 at the femoral neck or spine after appropriate evaluation to exclude secondary causes c. Low bone mass (T-score between -1.0 and -2.5 at the femoral neck or spine) and a 10 year probability of a hip fracture > or = 3% or a 10 year probability of a major  osteoporosis-related fracture > or = 20% based on the [place]-adapted WHO algorithm d. Clinician judgment and/or patient preferences may indicate treatment for people with 10-year fracture probabilities above or below these levels FOLLOW-UP: Patients with diagnosis of osteoporosis or at high risk for fracture should have regular bone mineral density tests. For patients eligible for Medicare, routine testing is allowed once every 2 years. The testing frequency can be increased to one year for patients who have rapidly progressing disease, those who are receiving or discontinuing medical therapy to restore bone mass, or have additional risk factors. I have reviewed this report and agree with the above findings. Mark A. Thornton Papas, M.D. [facility] FRAX* 10-year Probability of Fracture Based on femoral neck BMD: DualFemur (Right) Major Osteoporotic Fracture: 5.0% Hip Fracture:                1.4% Population:                  [place] (Black) Risk Factors:                Tobacco User (Current Smoker) *FRAX is a Materials engineer of the [employer] for Metabolic Bone Disease, a World Pharmacologist (WHO) [facility]. ASSESSMENT: The probability of a major osteoporotic fracture is 5.0% within the next ten years. The probability of a hip fracture is 1.4% within the next ten years. I have reviewed this report and agree with the above findings. Mark A. Thornton Papas, M.D. [facility] Electronically Signed   By: Lavonia Dana M.D.   On: 03/09/2019 13:29   MM 3D SCREEN BREAST BILATERAL  Result Date: 03/10/2019 CLINICAL DATA:  Screening. EXAM: DIGITAL SCREENING  BILATERAL MAMMOGRAM WITH TOMO AND CAD COMPARISON:  Previous exam(s). ACR Breast Density Category b: There are scattered areas of fibroglandular density. FINDINGS: There are no findings suspicious for malignancy. Images were processed with CAD. IMPRESSION: No mammographic evidence of malignancy. A result letter of this screening  mammogram will be mailed directly to the patient. RECOMMENDATION: Screening mammogram in one year. (Code:SM-B-01Y) BI-RADS CATEGORY  1: Negative. Electronically Signed   By: Claudie Revering M.D.   On: 03/10/2019 16:35   VAS [place] UPPER EXTREMITY ARTERIAL DUPLEX  Result Date: 02/28/2019 UPPER EXTREMITY DUPLEX STUDY Indications: Patient complains of Pre op HD access.  Performing Technologist: Ralene Cork RVT  Examination Guidelines: A complete evaluation includes B-mode imaging, spectral Doppler, color Doppler, and power Doppler as needed of all accessible portions of each vessel. Bilateral testing is considered an integral part of a complete examination. Limited examinations for reoccurring indications may be performed as noted.  Right Pre-Dialysis Findings: +-----------------------+----------+--------------------+---------+--------+  Location                PSV (cm/s) Intralum. Diam. (cm) Waveform  Comments  +-----------------------+----------+--------------------+---------+--------+  Brachial Antecub. fossa 87         0.32                 triphasic           +-----------------------+----------+--------------------+---------+--------+  Radial Art at Wrist     69         0.18                 triphasic           +-----------------------+----------+--------------------+---------+--------+  Ulnar Art at Wrist      65         0.12                 triphasic           +-----------------------+----------+--------------------+---------+--------+ Left Pre-Dialysis Findings: +-----------------------+----------+--------------------+---------+--------+  Location                PSV (cm/s) Intralum. Diam. (cm) Waveform  Comments  +-----------------------+----------+--------------------+---------+--------+  Brachial Antecub. fossa 97         0.36                 triphasic           +-----------------------+----------+--------------------+---------+--------+  Radial Art at Wrist     68         0.20                 triphasic            +-----------------------+----------+--------------------+---------+--------+  Ulnar Art at Wrist      75         0.12                 triphasic           +-----------------------+----------+--------------------+---------+--------+  Summary:  Right: Patent brachial, radial, and ulnar arteries. Left: Patent brachial, radial, and ulnar arteries. *See table(s) above for measurements and observations. Electronically signed by Curt Jews MD on 02/28/2019 at 2:03:48 PM.    Final    VAS [place] UPPER EXTREMITY VEIN MAPPING  Result Date: 02/28/2019 UPPER EXTREMITY VEIN MAPPING  Indications: Pre-access. Performing Technologist: Ralene Cork RVT  Examination Guidelines: A complete evaluation includes B-mode imaging, spectral Doppler, color Doppler, and power Doppler as needed of all accessible portions of each vessel. Bilateral testing is considered an integral part of a complete examination. Limited examinations for reoccurring  indications may be performed as noted. +-----------------+-------------+----------+--------------+  Right Cephalic    Diameter (cm) Depth (cm)    Findings     +-----------------+-------------+----------+--------------+  Shoulder              0.20                                 +-----------------+-------------+----------+--------------+  Prox upper arm        0.16                                 +-----------------+-------------+----------+--------------+  Mid upper arm         0.07                                 +-----------------+-------------+----------+--------------+  Dist upper arm        0.08                                 +-----------------+-------------+----------+--------------+  Antecubital fossa                            thrombosed    +-----------------+-------------+----------+--------------+  Prox forearm          0.10                                 +-----------------+-------------+----------+--------------+  Mid forearm                                not visualized   +-----------------+-------------+----------+--------------+  Dist forearm                               not visualized  +-----------------+-------------+----------+--------------+ +-----------------+-------------+----------+--------+  Right Basilic     Diameter (cm) Depth (cm) Findings  +-----------------+-------------+----------+--------+  Dist upper arm        0.23                           +-----------------+-------------+----------+--------+  Antecubital fossa     0.17                           +-----------------+-------------+----------+--------+ +-----------------+-------------+----------+--------------+  Left Cephalic     Diameter (cm) Depth (cm)    Findings     +-----------------+-------------+----------+--------------+  Shoulder                                   not visualized  +-----------------+-------------+----------+--------------+  Prox upper arm        0.19                                 +-----------------+-------------+----------+--------------+  Mid upper arm                              not visualized  +-----------------+-------------+----------+--------------+  Dist upper arm                               thrombosed    +-----------------+-------------+----------+--------------+  Antecubital fossa     0.12                                 +-----------------+-------------+----------+--------------+  Prox forearm                                 thrombosed    +-----------------+-------------+----------+--------------+  Mid forearm                                not visualized  +-----------------+-------------+----------+--------------+  Dist forearm                               not visualized  +-----------------+-------------+----------+--------------+ +--------------+-------------+----------+--------------+  Left Basilic   Diameter (cm) Depth (cm)    Findings     +--------------+-------------+----------+--------------+  Prox upper arm                          not visualized   +--------------+-------------+----------+--------------+  Mid upper arm                           not visualized  +--------------+-------------+----------+--------------+  Dist upper arm                          not visualized  +--------------+-------------+----------+--------------+ Summary: Right: Very small cephalic vein with thrombosis at the antecubitum.        Patent basilic vein. Left: Very small cephalic vein with areas of thrombosis. Basilic       vein not visualized. *See table(s) above for measurements and observations.  Diagnosing physician: Curt Jews MD Electronically signed by Curt Jews MD on 02/28/2019 at 2:04:05 PM.    Final     ASSESSMENT & PLAN Sara Davis 69 y.o. female with medical history significant for normocytic anemia presents for a follow up visit.   #Normocytic Anemia #Iron Deficiency Anemia 2/2 to Chronic Blood Loss #Anemia of Chronic Disease, CKD --patient's anemia is clearly multifactorial, including low erythropoietin from CKD, recent GI bleed and iron deficiency  --continue PO iron 325mg  QOD with a source of vitamin C --today will check MMA and homocysteine levels as the patient had borderline low serum Vitamin b12. Will hold on repeat iron panel so close to administration of feraheme.  --will check repeat CBC, retic panel, EPO level, and peripheral blood film  --per records there has been no confirmatory testing to denote resolution of H. Pylori. Can consider stool antigen testing if iron levels not improving at next visit.  --RTC in 3 months. Strict return precautions for symptoms of anemia.    No orders of the defined types were placed in this encounter.   All questions were answered. The patient knows to call the clinic with any problems, questions or concerns.  A total of more than {CHL ONC TIME VISIT - IWLNL:[id number]} were spent on this encounter and over half of that time was spent on counseling and coordination of care as outlined  above.   Ledell Peoples, MD Department of Hematology/Oncology [place] at [facility] Phone: [phone] Pager: [phone] Email: [email or web address]  03/28/2019 3:14 PM

## 2019-03-29 ENCOUNTER — Inpatient Hospital Stay: Payer: Medicare HMO

## 2019-03-29 ENCOUNTER — Inpatient Hospital Stay: Payer: Medicare HMO | Attending: Hematology and Oncology | Admitting: Hematology and Oncology

## 2019-04-24 ENCOUNTER — Encounter: Payer: Self-pay | Admitting: Cardiology

## 2019-05-09 ENCOUNTER — Ambulatory Visit (INDEPENDENT_AMBULATORY_CARE_PROVIDER_SITE_OTHER): Payer: Medicare HMO | Admitting: Adult Health Nurse Practitioner

## 2019-05-09 ENCOUNTER — Other Ambulatory Visit: Payer: Self-pay

## 2019-05-09 ENCOUNTER — Encounter: Payer: Self-pay | Admitting: Adult Health Nurse Practitioner

## 2019-05-09 VITALS — BP 196/112 | HR 80 | Temp 97.3°F | Resp 17 | Ht 66.0 in | Wt 136.8 lb

## 2019-05-09 DIAGNOSIS — E43 Unspecified severe protein-calorie malnutrition: Secondary | ICD-10-CM | POA: Diagnosis not present

## 2019-05-09 DIAGNOSIS — Z720 Tobacco use: Secondary | ICD-10-CM

## 2019-05-09 DIAGNOSIS — I272 Pulmonary hypertension, unspecified: Secondary | ICD-10-CM | POA: Diagnosis not present

## 2019-05-09 DIAGNOSIS — E119 Type 2 diabetes mellitus without complications: Secondary | ICD-10-CM | POA: Diagnosis not present

## 2019-05-09 DIAGNOSIS — Z794 Long term (current) use of insulin: Secondary | ICD-10-CM | POA: Diagnosis not present

## 2019-05-09 DIAGNOSIS — K5909 Other constipation: Secondary | ICD-10-CM | POA: Insufficient documentation

## 2019-05-09 DIAGNOSIS — F4321 Adjustment disorder with depressed mood: Secondary | ICD-10-CM

## 2019-05-09 DIAGNOSIS — F4329 Adjustment disorder with other symptoms: Secondary | ICD-10-CM | POA: Insufficient documentation

## 2019-05-09 DIAGNOSIS — D649 Anemia, unspecified: Secondary | ICD-10-CM

## 2019-05-09 DIAGNOSIS — N184 Chronic kidney disease, stage 4 (severe): Secondary | ICD-10-CM | POA: Insufficient documentation

## 2019-05-09 DIAGNOSIS — Z0000 Encounter for general adult medical examination without abnormal findings: Secondary | ICD-10-CM | POA: Diagnosis not present

## 2019-05-09 DIAGNOSIS — R634 Abnormal weight loss: Secondary | ICD-10-CM | POA: Insufficient documentation

## 2019-05-09 DIAGNOSIS — K31819 Angiodysplasia of stomach and duodenum without bleeding: Secondary | ICD-10-CM | POA: Diagnosis not present

## 2019-05-09 DIAGNOSIS — R630 Anorexia: Secondary | ICD-10-CM

## 2019-05-09 LAB — POCT URINALYSIS DIP (MANUAL ENTRY)
Bilirubin, UA: NEGATIVE
Glucose, UA: 250 mg/dL — AB
Ketones, POC UA: NEGATIVE mg/dL
Nitrite, UA: NEGATIVE
Protein Ur, POC: >=300 mg/dL — AB
Spec Grav, UA: 1.025 (ref 1.010–1.025)
Urobilinogen, UA: 0.2 E.U./dL
pH, UA: 7 (ref 5.0–8.0)

## 2019-05-09 MED ORDER — METAMUCIL SMOOTH TEXTURE 58.6 % PO POWD: 1.0000 | Freq: Three times a day (TID) | ORAL | 12 refills | Status: DC

## 2019-05-09 MED ORDER — CARVEDILOL 25 MG PO TABS: 25.0000 mg | ORAL_TABLET | Freq: Two times a day (BID) | ORAL | 3 refills | Status: DC

## 2019-05-09 MED ORDER — ALENDRONATE SODIUM 70 MG PO TABS: 70.0000 mg | ORAL_TABLET | ORAL | 11 refills | Status: DC

## 2019-05-09 MED ORDER — INSULIN GLARGINE 100 UNIT/ML ~~LOC~~ SOLN: 20.0000 [IU] | Freq: Every day | SUBCUTANEOUS | 0 refills | Status: DC

## 2019-05-09 MED ORDER — DILTIAZEM HCL ER 120 MG PO CP12: 120.0000 mg | ORAL_CAPSULE | Freq: Two times a day (BID) | ORAL | 0 refills | Status: DC

## 2019-05-09 NOTE — Progress Notes (Signed)
Chief Complaint  Patient presents with  . New Patient (Initial Visit)    establish care. Urine collected.  Pt loss mom 3 months ago, decreased appetite and is losing weight, pt c/o ha's and tingling in head. No otc med for ha pain.  Pt bp evelated today 196/112  . Medication Refill    norvasc, atorvastatin and ferrous sulfate    HPI   Patient presents to establish care.  Patient reports she was going to the resident clinic in family medicine but got tired of seeing a new provider each time so has switched her care to this location.    Patient endorses grief after losing her mother 3 months ago who made it to 54.  She and her siblings took turns caring with her full time so she could be at home.  We discussed the grief and she does relate that since her mother's death, she has had no appetite.  Barely eats anything daily. She endorses weight loss and though she has chronic constipation, she has now gone 5 days without a bowel movement.  She does not feel bloated.  No pain.   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  Had an Hgb of 4.7 requiring >1 transfusion.  In addition, chest pain during that admission was evaluated with Echo noting Grade II Diastolic Dysfunction /Pulmonary HTN.   Had a consult with a surgeon for an AV fistula graft which she declined at that time.  She questioned whether her renal function could return to normal.  Explained that she has a finite number of nephrons and that they do not regenerate but that she could maintain her renal function she has now if we can improve blood pressure and blood sugar better.  She verbalized understanding.    We discussed medication changes in depth, nutrition, and will address smoking cessation at a later visit.   Problem List    Problem List: 2021-04: Chronic constipation 2021-04: Appetite absent 2021-04: Recent unintentional  weight loss over several months 5329-92: Complicated grief 4268-34: Chronic kidney disease (CKD), stage IV (severe) ([place]) 2020-11: Dyspnea on exertion 2020-11: Low back pain 2020-02: Emphysema of lung ([place]) 2019-12: CHF exacerbation ([place]) 2019-12: Bilateral leg edema 2018-11: Pancreatic cyst 2018-11: Symptomatic anemia 2018-10: Protein-calorie malnutrition, severe 2018-02: Vision problem 2017-06: Onychomycosis 2017-01: Abscess 2017-01: Scalp lesion 2016-09: Cellulitis of foot 2016-09: Diabetic foot infection ([place]) 2016-03: Headache 2015-12: Pain of left great toe 2015-12: Cellulitis of left foot 2015-06: Insomnia 2013-12: Uncontrolled type 2 diabetes mellitus ([place]) 2013-12: HTN (hypertension) 2013-12: Tobacco abuse Diabetic infection of left foot ([place]) Blood poisoning ([place]) Type 2 diabetes mellitus with foot ulcer ([place]) Cellulitis of left lower leg Essential hypertension Cellulitis of scalp Hyperglycemia Chest pain Hypertensive urgency AKI (acute kidney injury) ([place]) Herpes zoster without complication Acute gastric ulcer without hemorrhage or perforation Angiodysplasia of duodenum Benign neoplasm of transverse colon Benign neoplasm of sigmoid colon Type 2 diabetes mellitus without complication, with long-term current  use of insulin ([facility])   Allergies   is allergic to aspirin.  Medications   Current Meds  Medication Sig  . Alcohol Swabs (B-D SINGLE USE SWABS REGULAR) PADS 1 Units by Does not apply route 3 (three) times daily as needed.  Marland Kitchen amLODipine (NORVASC) 10 MG tablet Take 1 tablet (10 mg total) by mouth daily.  Marland Kitchen atorvastatin (LIPITOR) 80 MG tablet Take 1 tablet (80 mg total) by mouth  daily.  . Blood Glucose Calibration (ACCU-CHEK AVIVA) SOLN 1 Units by In Vitro route 3 (three) times daily as needed.  . Blood Glucose Monitoring Suppl (ACCU-CHEK AVIVA) device Use as instructed  . carvedilol (COREG) 25 MG tablet Take 1 tablet (25 mg total) by mouth 2 (two)  times daily with a meal.  . ferrous sulfate 325 (65 FE) MG tablet Take 1 tablet (325 mg total) by mouth every other day.  Marland Kitchen glucose blood (ACCU-CHEK AVIVA PLUS) test strip Use as instructed  . insulin NPH-regular Human (70-30) 100 UNIT/ML injection Inject 22 Units into the skin 2 (two) times daily. (Patient taking differently: Inject 20 Units into the skin 2 (two) times daily. )  . Insulin Pen Needle (B-D ULTRAFINE III SHORT PEN) 31G X 8 MM MISC CHECK BLOOD SUGARS ONCE IN MORNING BEFORE FOOD AND ONCE IN THE EVENING.  . Lancets (ACCU-CHEK SOFT TOUCH) lancets Use as instructed  . Semaglutide,0.25 or 0.'5MG'$ /DOS, (OZEMPIC, 0.25 OR 0.5 MG/DOSE,) 2 MG/1.5ML SOPN Inject 0.25 mg into the skin once a week.  . vitamin B-12 (CYANOCOBALAMIN) 1000 MCG tablet Take 1 tablet (1,000 mcg total) by mouth daily.  . [DISCONTINUED] carvedilol (COREG) 25 MG tablet Take 1 tablet (25 mg total) by mouth 2 (two) times daily.     Review of Systems    Constitutional: Negative for activity change, appetite change, chills and fever.  HENT: Negative for congestion, nosebleeds, trouble swallowing and voice change.   Respiratory: Negative for cough, shortness of breath and wheezing.   Cardiac:  Negative for chest pain, pressure, syncope  Gastrointestinal: Negative for diarrhea, nausea and vomiting.  Genitourinary: Negative for difficulty urinating, dysuria, flank pain and hematuria.  Musculoskeletal: Negative for back pain, joint swelling and neck pain.  Neurological: Negative for dizziness, speech difficulty, light-headedness and numbness.  See HPI. All other review of systems negative.     Physical Exam:    height is '5\' 6"'$  (1.676 m) and weight is 136 lb 12.8 oz (62.1 kg). Her temporal temperature is 97.3 F (36.3 C) (abnormal). Her blood pressure is 196/112 (abnormal) and her pulse is 80. Her respiration is 17 and oxygen saturation is 100%.   Physical Examination: General appearance - alert, well appearing, and in no  distress and oriented to person, place, and time Mental status - normal mood, behavior, speech, dress, motor activity, and thought processes Eyes - PERRL. Extraocular movements intact.  No nystagmus.  Neck - supple, no significant adenopathy, carotids upstroke normal bilaterally, no bruits, thyroid exam: thyroid is normal in size without nodules or tenderness Chest - clear to auscultation, no wheezes, rales or rhonchi, symmetric air entry  Heart - normal rate, regular rhythm, normal S1, S2, no murmurs, rubs, clicks or gallops Extremities - dependent LE edema without clubbing or cyanosis Skin - normal coloration and turgor, no rashes, no suspicious skin lesions noted  No hyperpigmentation of skin.  No current hematomas noted   Lab /Imaging Review    orders written for new lab studies as appropriate; see orders, no lab studies available for review at time of visit.   Assessment & Plan:  Sara Davis is a 69 y.o. female    1. Routine health maintenance   2. Protein-calorie malnutrition, severe   3. Type 2 diabetes mellitus without complication, with long-term current use of insulin ([place])   4. Symptomatic anemia   5. Tobacco abuse   6. Angiodysplasia of duodenum   7. Pulmonary hypertension ([place])   8. Chronic kidney disease (  CKD), stage IV (severe) ([place])   9. Complicated grief   10. Recent unintentional weight loss over several months   11. Appetite absent   12. Chronic constipation    Orders Placed This Encounter  Procedures  . Copper, Serum  . Homocysteine  . Methylmalonic Acid, Serum  . Folate, RBC and Serum  . Vitamin B12  . CBC with Differential  . Retic Panel  . Iron, TIBC and Ferritin Panel  . CMP14+EGFR  . Hemoglobin A1c  . VITAMIN D 25 Hydroxy (Vit-D Deficiency, Fractures)  . Ambulatory referral to Gastroenterology  . POCT urinalysis dipstick   Meds ordered this encounter  Medications  . alendronate (FOSAMAX) 70 MG tablet    Sig: Take 1 tablet (70 mg total) by  mouth every 7 (seven) days. Take with a full glass of water on an empty stomach.    Dispense:  4 tablet    Refill:  11  . insulin glargine (LANTUS) 100 UNIT/ML injection    Sig: Inject 0.2 mLs (20 Units total) into the skin at bedtime.    Dispense:  6 mL    Refill:  0  . carvedilol (COREG) 25 MG tablet    Sig: Take 1 tablet (25 mg total) by mouth 2 (two) times daily with a meal.    Dispense:  180 tablet    Refill:  3    **Patient requests 90 days supply**  . DISCONTD: psyllium (METAMUCIL SMOOTH TEXTURE) 58.6 % powder    Sig: Take 1 packet by mouth 3 (three) times daily.    Dispense:  283 g    Refill:  12  . psyllium (METAMUCIL SMOOTH TEXTURE) 58.6 % powder    Sig: Take 1 packet by mouth 3 (three) times daily.    Dispense:  283 g    Refill:  12  . diltiazem (CARDIZEM SR) 120 MG 12 hr capsule    Sig: Take 1 capsule (120 mg total) by mouth 2 (two) times daily.    Dispense:  60 capsule    Refill:  0   A total of 50 minutes were spent face-to-face with the patient during this encounter and over half of that time was spent on counseling and coordination of care.   Glyn Ade, NP

## 2019-05-09 NOTE — Patient Instructions (Signed)
° ° ° °  If you have lab work done today you will be contacted with your lab results within the next 2 weeks.  If you have not heard from us then please contact us. The fastest way to get your results is to register for My Chart. ° ° °IF you received an x-ray today, you will receive an invoice from [facility]. Please contact [facility] at [phone] with questions or concerns regarding your invoice.  ° °IF you received labwork today, you will receive an invoice from [facility]. Please contact [facility] at [phone] with questions or concerns regarding your invoice.  ° °Our billing staff will not be able to assist you with questions regarding bills from these companies. ° °You will be contacted with the lab results as soon as they are available. The fastest way to get your results is to activate your My Chart account. Instructions are located on the last page of this paperwork. If you have not heard from us regarding the results in 2 weeks, please contact this office. °  ° ° ° °

## 2019-05-09 NOTE — Progress Notes (Signed)
Per Judson Roch advised pt  similar med to the amlodipine sent to pharmacy and pt is able to pickup.  Advised to hold amlodipine and start new med once daily and then increase to twice daily as body tolerates ad metamucil sent in also for her to use every morning to help with bm's.

## 2019-05-10 ENCOUNTER — Ambulatory Visit (INDEPENDENT_AMBULATORY_CARE_PROVIDER_SITE_OTHER): Payer: Medicare HMO | Admitting: Adult Health Nurse Practitioner

## 2019-05-10 VITALS — BP 138/71 | HR 74

## 2019-05-10 DIAGNOSIS — I10 Essential (primary) hypertension: Secondary | ICD-10-CM

## 2019-05-16 ENCOUNTER — Ambulatory Visit (INDEPENDENT_AMBULATORY_CARE_PROVIDER_SITE_OTHER): Payer: Medicare HMO | Admitting: Adult Health Nurse Practitioner

## 2019-05-16 ENCOUNTER — Other Ambulatory Visit: Payer: Self-pay

## 2019-05-16 ENCOUNTER — Encounter: Payer: Self-pay | Admitting: Adult Health Nurse Practitioner

## 2019-05-16 VITALS — BP 124/64 | HR 71 | Temp 97.3°F | Ht 66.0 in | Wt 139.8 lb

## 2019-05-16 DIAGNOSIS — Z23 Encounter for immunization: Secondary | ICD-10-CM

## 2019-05-16 DIAGNOSIS — Z135 Encounter for screening for eye and ear disorders: Secondary | ICD-10-CM

## 2019-05-16 DIAGNOSIS — L97529 Non-pressure chronic ulcer of other part of left foot with unspecified severity: Secondary | ICD-10-CM

## 2019-05-16 DIAGNOSIS — Z0000 Encounter for general adult medical examination without abnormal findings: Secondary | ICD-10-CM

## 2019-05-16 DIAGNOSIS — E1165 Type 2 diabetes mellitus with hyperglycemia: Secondary | ICD-10-CM

## 2019-05-16 DIAGNOSIS — L97519 Non-pressure chronic ulcer of other part of right foot with unspecified severity: Secondary | ICD-10-CM

## 2019-05-16 MED ORDER — VITAMIN D (ERGOCALCIFEROL) 1.25 MG (50000 UNIT) PO CAPS: 50000.0000 [IU] | ORAL_CAPSULE | ORAL | 3 refills | Status: AC

## 2019-05-16 NOTE — Patient Instructions (Signed)
° ° ° °  If you have lab work done today you will be contacted with your lab results within the next 2 weeks.  If you have not heard from us then please contact us. The fastest way to get your results is to register for My Chart. ° ° °IF you received an x-ray today, you will receive an invoice from [facility]. Please contact [facility] at [phone] with questions or concerns regarding your invoice.  ° °IF you received labwork today, you will receive an invoice from [facility]. Please contact [facility] at [phone] with questions or concerns regarding your invoice.  ° °Our billing staff will not be able to assist you with questions regarding bills from these companies. ° °You will be contacted with the lab results as soon as they are available. The fastest way to get your results is to activate your My Chart account. Instructions are located on the last page of this paperwork. If you have not heard from us regarding the results in 2 weeks, please contact this office. °  ° ° ° °

## 2019-05-17 LAB — IRON,TIBC AND FERRITIN PANEL
Ferritin: 112 ng/mL (ref 15–150)
Iron Saturation: 40 % (ref 15–55)
Iron: 112 ug/dL (ref 27–139)
Total Iron Binding Capacity: 283 ug/dL (ref 250–450)
UIBC: 171 ug/dL (ref 118–369)

## 2019-05-17 LAB — CBC WITH DIFFERENTIAL/PLATELET
Basophils Absolute: 0 10*3/uL (ref 0.0–0.2)
Basos: 0 %
EOS (ABSOLUTE): 0.1 10*3/uL (ref 0.0–0.4)
Eos: 2 %
Hematocrit: 31.8 % — ABNORMAL LOW (ref 34.0–46.6)
Hemoglobin: 10.5 g/dL — ABNORMAL LOW (ref 11.1–15.9)
Immature Grans (Abs): 0 10*3/uL (ref 0.0–0.1)
Immature Granulocytes: 0 %
Lymphocytes Absolute: 1.7 10*3/uL (ref 0.7–3.1)
Lymphs: 24 %
MCH: 28.1 pg (ref 26.6–33.0)
MCHC: 33 g/dL (ref 31.5–35.7)
MCV: 85 fL (ref 79–97)
Monocytes Absolute: 0.4 10*3/uL (ref 0.1–0.9)
Monocytes: 5 %
Neutrophils Absolute: 4.8 10*3/uL (ref 1.4–7.0)
Neutrophils: 69 %
Platelets: 347 10*3/uL (ref 150–450)
RBC: 3.74 x10E6/uL — ABNORMAL LOW (ref 3.77–5.28)
RDW: 12.6 % (ref 11.7–15.4)
WBC: 7 10*3/uL (ref 3.4–10.8)

## 2019-05-17 LAB — CMP14+EGFR
ALT: 5 IU/L (ref 0–32)
AST: 10 IU/L (ref 0–40)
Albumin/Globulin Ratio: 2.5 — ABNORMAL HIGH (ref 1.2–2.2)
Albumin: 4.5 g/dL (ref 3.8–4.8)
Alkaline Phosphatase: 152 IU/L — ABNORMAL HIGH (ref 39–117)
BUN/Creatinine Ratio: 8 — ABNORMAL LOW (ref 12–28)
BUN: 23 mg/dL (ref 8–27)
Bilirubin Total: 0.2 mg/dL (ref 0.0–1.2)
CO2: 14 mmol/L — CL (ref 20–29)
Calcium: 10 mg/dL (ref 8.7–10.3)
Chloride: 113 mmol/L — ABNORMAL HIGH (ref 96–106)
Creatinine, Ser: 2.99 mg/dL — ABNORMAL HIGH (ref 0.57–1.00)
GFR calc Af Amer: 18 mL/min/{1.73_m2} — ABNORMAL LOW (ref >59)
GFR calc non Af Amer: 16 mL/min/{1.73_m2} — ABNORMAL LOW (ref >59)
Globulin, Total: 1.8 g/dL (ref 1.5–4.5)
Glucose: 140 mg/dL — ABNORMAL HIGH (ref 65–99)
Potassium: 4.3 mmol/L (ref 3.5–5.2)
Sodium: 142 mmol/L (ref 134–144)
Total Protein: 6.3 g/dL (ref 6.0–8.5)

## 2019-05-17 LAB — METHYLMALONIC ACID, SERUM: Methylmalonic Acid: 315 nmol/L (ref 0–378)

## 2019-05-17 LAB — HEMOGLOBIN A1C
Est. average glucose Bld gHb Est-mCnc: 151 mg/dL
Hgb A1c MFr Bld: 6.9 % — ABNORMAL HIGH (ref 4.8–5.6)

## 2019-05-17 LAB — HOMOCYSTEINE: Homocysteine: 12.8 umol/L (ref 0.0–17.2)

## 2019-05-17 LAB — FOLATE, RBC AND SERUM: Folate: 7.8 ng/mL (ref >3.0)

## 2019-05-17 LAB — VITAMIN B12: Vitamin B-12: 760 pg/mL (ref 232–1245)

## 2019-05-17 LAB — VITAMIN D 25 HYDROXY (VIT D DEFICIENCY, FRACTURES): Vit D, 25-Hydroxy: 5.7 ng/mL — ABNORMAL LOW (ref 30.0–100.0)

## 2019-05-17 LAB — COPPER, SERUM: Copper: 148 ug/dL (ref 80–158)

## 2019-05-22 NOTE — Progress Notes (Signed)
History   Chief Complaint  Patient presents with  . Hypertension  . Diabetes    checks blood sugars 140 this morning/ blisters on feet from shoes x 2 days     HPI   Patient presents with blisters on toes.  She has been wearing shoes which aggravated a sore on her foot.  They are painful.  One has popped and she has two others on her left toes 2-4.    Reviewed recent labwork which includes a Vitamin D that was low.  We discussed supplementation which she is amenable to  Past Medical History:  Diagnosis Date  . Cellulitis of scalp 02/2015  . Chronic kidney disease   . Depression   . Diabetes mellitus without complication ([place])   . Hyperlipidemia   . Hypertension    Past Surgical History:  Procedure Laterality Date  . ABDOMINAL HYSTERECTOMY    . BIOPSY  01/07/2018   Procedure: BIOPSY;  Surgeon: Irene Shipper, MD;  Location: [facility] ENDOSCOPY;  Service: Endoscopy;;  Clotest  . COLONOSCOPY WITH PROPOFOL N/A 01/07/2018   Procedure: COLONOSCOPY WITH PROPOFOL;  Surgeon: Irene Shipper, MD;  Location: [facility] ENDOSCOPY;  Service: Endoscopy;  Laterality: N/A;  . ESOPHAGOGASTRODUODENOSCOPY (EGD) WITH PROPOFOL N/A 01/07/2018   Procedure: ESOPHAGOGASTRODUODENOSCOPY (EGD) WITH PROPOFOL;  Surgeon: Irene Shipper, MD;  Location: [facility] ENDOSCOPY;  Service: Endoscopy;  Laterality: N/A;  . ESOPHAGOGASTRODUODENOSCOPY (EGD) WITH PROPOFOL N/A 12/14/2018   Procedure: ESOPHAGOGASTRODUODENOSCOPY (EGD) WITH PROPOFOL;  Surgeon: Irene Shipper, MD;  Location: [facility] ENDOSCOPY;  Service: Endoscopy;  Laterality: N/A;  . HEMOSTASIS CONTROL  12/14/2018   Procedure: HEMOSTASIS CONTROL;  Surgeon: Irene Shipper, MD;  Location: [facility] ENDOSCOPY;  Service: Endoscopy;;  . POLYPECTOMY  01/07/2018   Procedure: POLYPECTOMY;  Surgeon: Irene Shipper, MD;  Location[facility] ENDOSCOPY;  Service: Endoscopy;;   Family History  Problem Relation Age of Onset  . Diabetes Mother   . Cancer Mother   . Diabetes Sister   . Hypertension Sister   . Cancer  Sister 12       Bone  . Diabetes Brother   . Diabetes Sister   . Diabetes Sister    Social History   Tobacco Use  . Smoking status: Current Every Day Smoker    Packs/day: 0.50    Years: 40.00    Pack years: 20.00    Types: Cigarettes  . Smokeless tobacco: Never Used  . Tobacco comment: 6 cigs per day  Substance Use Topics  . Alcohol use: No    Alcohol/week: 0.0 standard drinks  . Drug use: No   OB History   No obstetric history on file.    Review of Systems   Review of Systems  Constitutional: Negative for activity change, appetite change, chills and fever.  HENT: Negative for congestion, nosebleeds, trouble swallowing and voice change.   Respiratory: Negative for cough, shortness of breath and wheezing.   Gastrointestinal: Negative for diarrhea, nausea and vomiting.  Genitourinary: Negative for difficulty urinating, dysuria, flank pain and hematuria.  Musculoskeletal: Negative for back pain, joint swelling and neck pain.  Neurological: Negative for dizziness, speech difficulty, light-headedness and numbness.  Extremities:  Positive for blisters See HPI. All other review of systems negative.    Allergies   Aspirin  Home Medications    Current Outpatient Medications:  .  Alcohol Swabs (B-D SINGLE USE SWABS REGULAR) PADS, 1 Units by Does not apply route 3 (three) times daily as needed., Disp: 90 each, Rfl: 2 .  alendronate (FOSAMAX) 70 MG tablet, Take 1 tablet (70 mg total) by mouth every 7 (seven) days. Take with a full glass of water on an empty stomach., Disp: 4 tablet, Rfl: 11 .  amLODipine (NORVASC) 10 MG tablet, Take 1 tablet (10 mg total) by mouth daily., Disp: 90 tablet, Rfl: 1 .  atorvastatin (LIPITOR) 80 MG tablet, Take 1 tablet (80 mg total) by mouth daily., Disp: 90 tablet, Rfl: 1 .  Blood Glucose Calibration (ACCU-CHEK AVIVA) SOLN, 1 Units by In Vitro route 3 (three) times daily as needed., Disp: 90 each, Rfl: 0 .  Blood Glucose Monitoring Suppl  (ACCU-CHEK AVIVA) device, Use as instructed, Disp: 1 each, Rfl: 0 .  calcitRIOL (ROCALTROL) 0.25 MCG capsule, , Disp: , Rfl:  .  carvedilol (COREG) 25 MG tablet, Take 1 tablet (25 mg total) by mouth 2 (two) times daily with a meal., Disp: 180 tablet, Rfl: 3 .  diltiazem (CARDIZEM SR) 120 MG 12 hr capsule, Take 1 capsule (120 mg total) by mouth 2 (two) times daily., Disp: 60 capsule, Rfl: 0 .  ferrous sulfate 325 (65 FE) MG tablet, Take 1 tablet (325 mg total) by mouth every other day., Disp: 45 tablet, Rfl: 1 .  glucose blood (ACCU-CHEK AVIVA PLUS) test strip, Use as instructed, Disp: 100 each, Rfl: 12 .  insulin glargine (LANTUS) 100 UNIT/ML injection, Inject 0.2 mLs (20 Units total) into the skin at bedtime., Disp: 6 mL, Rfl: 0 .  insulin NPH-regular Human (70-30) 100 UNIT/ML injection, Inject 22 Units into the skin 2 (two) times daily. (Patient taking differently: Inject 20 Units into the skin 2 (two) times daily. ), Disp: 1 vial, Rfl: 2 .  Insulin Pen Needle (B-D ULTRAFINE III SHORT PEN) 31G X 8 MM MISC, CHECK BLOOD SUGARS ONCE IN MORNING BEFORE FOOD AND ONCE IN THE EVENING., Disp: 100 each, Rfl: 0 .  Lancets (ACCU-CHEK SOFT TOUCH) lancets, Use as instructed, Disp: 100 each, Rfl: 12 .  psyllium (METAMUCIL SMOOTH TEXTURE) 58.6 % powder, Take 1 packet by mouth 3 (three) times daily., Disp: 283 g, Rfl: 12 .  Semaglutide,0.25 or 0.5MG /DOS, (OZEMPIC, 0.25 OR 0.5 MG/DOSE,) 2 MG/1.5ML SOPN, Inject 0.25 mg into the skin once a week., Disp: 1 pen, Rfl: 0 .  vitamin B-12 (CYANOCOBALAMIN) 1000 MCG tablet, Take 1 tablet (1,000 mcg total) by mouth daily., Disp: 90 tablet, Rfl: 3 .  Vitamin D, Ergocalciferol, (DRISDOL) 1.25 MG (50000 UNIT) CAPS capsule, Take 1 capsule (50,000 Units total) by mouth every 7 (seven) days., Disp: 5 capsule, Rfl: 3  Meds Ordered and Administered this Visit   Meds ordered this encounter  Medications  . Vitamin D, Ergocalciferol, (DRISDOL) 1.25 MG (50000 UNIT) CAPS capsule     Sig: Take 1 capsule (50,000 Units total) by mouth every 7 (seven) days.    Dispense:  5 capsule    Refill:  3    BP 124/64   Pulse 71   Temp (!) 97.3 F (36.3 C)   Ht 5\' 6"  (1.676 m)   Wt 139 lb 12.8 oz (63.4 kg)   SpO2 100%   BMI 22.56 kg/m   Physical Exam General appearance: alert, well appearing, and in no distress, oriented to person, place, and time and anxious. Chest: clear to auscultation, no wheezes, rales or rhonchi, symmetric air entry.  CVS exam: normal rate, regular rhythm, normal S1, S2, no murmurs, rubs, clicks or gallops. Skin exam - normal coloration and turgor, no rashes, no suspicious skin lesions noted. Mental  Status: normal mood, behavior, speech, dress, motor activity, and thought processes, anxious. Exremities:  Blisters noted to toes 2-5, one of which has exsanguinated  MDM   1. Need for prophylactic vaccination against Streptococcus pneumoniae (pneumococcus)   2. Screening for diabetic retinopathy   3. Uncontrolled type 2 diabetes mellitus with hyperglycemia ([place])   4. Routine health maintenance   5. Ulcer of both feet, unspecified ulcer stage ([place])    Orders Placed This Encounter  Procedures  . Ambulatory referral to Podiatry  . HM Diabetes Foot Exam    Meds ordered this encounter  Medications  . Vitamin D, Ergocalciferol, (DRISDOL) 1.25 MG (50000 UNIT) CAPS capsule    Sig: Take 1 capsule (50,000 Units total) by mouth every 7 (seven) days.    Dispense:  5 capsule    Refill:  [place], NP

## 2019-06-13 ENCOUNTER — Ambulatory Visit: Payer: Medicare HMO | Admitting: Podiatry

## 2019-06-13 ENCOUNTER — Encounter: Payer: Self-pay | Admitting: Podiatry

## 2019-06-13 ENCOUNTER — Other Ambulatory Visit: Payer: Self-pay

## 2019-06-13 VITALS — Temp 97.2°F | Resp 14

## 2019-06-13 DIAGNOSIS — E08621 Diabetes mellitus due to underlying condition with foot ulcer: Secondary | ICD-10-CM | POA: Diagnosis not present

## 2019-06-13 DIAGNOSIS — M2042 Other hammer toe(s) (acquired), left foot: Secondary | ICD-10-CM

## 2019-06-13 DIAGNOSIS — L97501 Non-pressure chronic ulcer of other part of unspecified foot limited to breakdown of skin: Secondary | ICD-10-CM

## 2019-06-13 DIAGNOSIS — M2041 Other hammer toe(s) (acquired), right foot: Secondary | ICD-10-CM | POA: Diagnosis not present

## 2019-06-13 DIAGNOSIS — E1149 Type 2 diabetes mellitus with other diabetic neurological complication: Secondary | ICD-10-CM

## 2019-06-13 MED ORDER — DOXYCYCLINE HYCLATE 100 MG PO TABS: 100.0000 mg | ORAL_TABLET | Freq: Two times a day (BID) | ORAL | 0 refills | Status: DC

## 2019-06-13 MED ORDER — MUPIROCIN 2 % EX OINT: 1.0000 "application " | TOPICAL_OINTMENT | Freq: Two times a day (BID) | CUTANEOUS | 2 refills | Status: DC

## 2019-06-13 NOTE — Progress Notes (Signed)
Subjective:   Patient ID: Sara Davis, female   DOB: 69 y.o.   MRN: [id number]   HPI 69 year old female presents the office today for concerns of blisters, ulcers to her feet.  She states that she wore different pair shoes without socks and notes when she noticed a blister starting in the wounds.  The wounds on the left side and this was on the right side.  She has had some occasional swelling.  She is not currently on any antibiotics.  Denies any pus but she is some clear drainage at times.  No red streaks.   Review of Systems  All other systems reviewed and are negative.  Past Medical History:  Diagnosis Date  . Cellulitis of scalp 02/2015  . Chronic kidney disease   . Depression   . Diabetes mellitus without complication ([place])   . Hyperlipidemia   . Hypertension     Past Surgical History:  Procedure Laterality Date  . ABDOMINAL HYSTERECTOMY    . BIOPSY  01/07/2018   Procedure: BIOPSY;  Surgeon: Irene Shipper, MD;  Location: [facility] ENDOSCOPY;  Service: Endoscopy;;  Clotest  . COLONOSCOPY WITH PROPOFOL N/A 01/07/2018   Procedure: COLONOSCOPY WITH PROPOFOL;  Surgeon: Irene Shipper, MD;  Location: [facility] ENDOSCOPY;  Service: Endoscopy;  Laterality: N/A;  . ESOPHAGOGASTRODUODENOSCOPY (EGD) WITH PROPOFOL N/A 01/07/2018   Procedure: ESOPHAGOGASTRODUODENOSCOPY (EGD) WITH PROPOFOL;  Surgeon: Irene Shipper, MD;  Location: [facility] ENDOSCOPY;  Service: Endoscopy;  Laterality: N/A;  . ESOPHAGOGASTRODUODENOSCOPY (EGD) WITH PROPOFOL N/A 12/14/2018   Procedure: ESOPHAGOGASTRODUODENOSCOPY (EGD) WITH PROPOFOL;  Surgeon: Irene Shipper, MD;  Location: [facility] ENDOSCOPY;  Service: Endoscopy;  Laterality: N/A;  . HEMOSTASIS CONTROL  12/14/2018   Procedure: HEMOSTASIS CONTROL;  Surgeon: Irene Shipper, MD;  Location: [facility] ENDOSCOPY;  Service: Endoscopy;;  . POLYPECTOMY  01/07/2018   Procedure: POLYPECTOMY;  Surgeon: Irene Shipper, MD;  Location: [facility] ENDOSCOPY;  Service: Endoscopy;;     Current Outpatient  Medications:  .  Alcohol Swabs (B-D SINGLE USE SWABS REGULAR) PADS, 1 Units by Does not apply route 3 (three) times daily as needed., Disp: 90 each, Rfl: 2 .  alendronate (FOSAMAX) 70 MG tablet, Take 1 tablet (70 mg total) by mouth every 7 (seven) days. Take with a full glass of water on an empty stomach., Disp: 4 tablet, Rfl: 11 .  amLODipine (NORVASC) 10 MG tablet, Take 1 tablet (10 mg total) by mouth daily., Disp: 90 tablet, Rfl: 1 .  atorvastatin (LIPITOR) 80 MG tablet, Take 1 tablet (80 mg total) by mouth daily., Disp: 90 tablet, Rfl: 1 .  Blood Glucose Calibration (ACCU-CHEK AVIVA) SOLN, 1 Units by In Vitro route 3 (three) times daily as needed., Disp: 90 each, Rfl: 0 .  Blood Glucose Monitoring Suppl (ACCU-CHEK AVIVA) device, Use as instructed, Disp: 1 each, Rfl: 0 .  calcitRIOL (ROCALTROL) 0.25 MCG capsule, , Disp: , Rfl:  .  carvedilol (COREG) 25 MG tablet, Take 1 tablet (25 mg total) by mouth 2 (two) times daily with a meal., Disp: 180 tablet, Rfl: 3 .  diltiazem (CARDIZEM SR) 120 MG 12 hr capsule, Take 1 capsule (120 mg total) by mouth 2 (two) times daily., Disp: 60 capsule, Rfl: 0 .  doxycycline (VIBRA-TABS) 100 MG tablet, Take 1 tablet (100 mg total) by mouth 2 (two) times daily., Disp: 20 tablet, Rfl: 0 .  ferrous sulfate 325 (65 FE) MG tablet, Take 1 tablet (325 mg total) by mouth every other day.,  Disp: 45 tablet, Rfl: 1 .  glucose blood (ACCU-CHEK AVIVA PLUS) test strip, Use as instructed, Disp: 100 each, Rfl: 12 .  insulin glargine (LANTUS) 100 UNIT/ML injection, Inject 0.2 mLs (20 Units total) into the skin at bedtime., Disp: 6 mL, Rfl: 0 .  insulin NPH-regular Human (70-30) 100 UNIT/ML injection, Inject 22 Units into the skin 2 (two) times daily. (Patient taking differently: Inject 20 Units into the skin 2 (two) times daily. ), Disp: 1 vial, Rfl: 2 .  Insulin Pen Needle (B-D ULTRAFINE III SHORT PEN) 31G X 8 MM MISC, CHECK BLOOD SUGARS ONCE IN MORNING BEFORE FOOD AND ONCE IN THE  EVENING., Disp: 100 each, Rfl: 0 .  Lancets (ACCU-CHEK SOFT TOUCH) lancets, Use as instructed, Disp: 100 each, Rfl: 12 .  mupirocin ointment (BACTROBAN) 2 %, Apply 1 application topically 2 (two) times daily., Disp: 30 g, Rfl: 2 .  psyllium (METAMUCIL SMOOTH TEXTURE) 58.6 % powder, Take 1 packet by mouth 3 (three) times daily., Disp: 283 g, Rfl: 12 .  Semaglutide,0.25 or 0.5MG /DOS, (OZEMPIC, 0.25 OR 0.5 MG/DOSE,) 2 MG/1.5ML SOPN, Inject 0.25 mg into the skin once a week., Disp: 1 pen, Rfl: 0 .  vitamin B-12 (CYANOCOBALAMIN) 1000 MCG tablet, Take 1 tablet (1,000 mcg total) by mouth daily., Disp: 90 tablet, Rfl: 3  Allergies  Allergen Reactions  . Aspirin Other (See Comments)    Upset stomach, can only take coated aspirin but prefers to not take it at all          Objective:  Physical Exam  General: AAO x3, NAD  Dermatological: Ulcerations present to the dorsal PIPJ's with fibrotic, granular tissue.  There is no probing to bone.  Minimal edema.  There is no significant erythema or warmth.  There is no erythema cellulitis.  Blisters present on the right foot.  There is no pus identified.        Vascular: Dorsalis Pedis artery and Posterior Tibial artery pedal pulses are 2/4 bilateral with immedate capillary fill time.There is no pain with calf compression, swelling, warmth, erythema.   Neruologic: Sensation decreased with Semmes Weinstein monofilament to the toes.  Musculoskeletal: Hammertoes present muscular strength 5/5 in all groups tested bilateral.  Gait: Unassisted, Nonantalgic.       Assessment:   Ulcerations, blisters bilateral lower extremities    Plan:  -Treatment options discussed including all alternatives, risks, and complications -Etiology of symptoms were discussed -She declines me draining the blisters on the right side.  Recommend to start doxycycline.  Also mupirocin ointment dressing changes daily.  Bilateral surgical shoe dispensed.  She has palpable  pulses-held formal arterial studies today.  If no improvement will order this next appointment.  Return in about 1 week (around 06/20/2019) for wounds.  X-ray next appointment  Trula Slade DPM

## 2019-06-20 ENCOUNTER — Ambulatory Visit (INDEPENDENT_AMBULATORY_CARE_PROVIDER_SITE_OTHER): Payer: Medicare HMO | Admitting: Podiatry

## 2019-06-20 ENCOUNTER — Ambulatory Visit (INDEPENDENT_AMBULATORY_CARE_PROVIDER_SITE_OTHER): Payer: Medicare HMO

## 2019-06-20 ENCOUNTER — Other Ambulatory Visit: Payer: Self-pay

## 2019-06-20 DIAGNOSIS — E1149 Type 2 diabetes mellitus with other diabetic neurological complication: Secondary | ICD-10-CM | POA: Diagnosis not present

## 2019-06-20 DIAGNOSIS — E08621 Diabetes mellitus due to underlying condition with foot ulcer: Secondary | ICD-10-CM | POA: Diagnosis not present

## 2019-06-20 DIAGNOSIS — L97501 Non-pressure chronic ulcer of other part of unspecified foot limited to breakdown of skin: Secondary | ICD-10-CM | POA: Diagnosis not present

## 2019-06-20 NOTE — Progress Notes (Signed)
Subjective: 69 year old female presents the office today for follow-up evaluation of ulcers, blisters to both of her feet.  She states that overall they are doing much better.  She has been keeping the mupirocin on the wounds daily.  She is on the doxycycline without any side effects.  She denies any drainage or pus or increase in swelling or redness of the feet.  She has no other concerns today. Denies any systemic complaints such as fevers, chills, nausea, vomiting. No acute changes since last appointment, and no other complaints at this time.   Objective: AAO x3, NAD DP/PT pulses palpable bilaterally, CRT less than 3 seconds Multiple wounds present on the dorsal aspect of the lesser digits bilaterally.  Small blister still present in the right fourth toe but otherwise there is a granular wound present with minimal hyperkeratotic tissue.  Old dried blister to the right distal hallux.  There is no surrounding erythema, ascending cellulitis.  No fluctuation crepitation.  No malodor. No open lesions or pre-ulcerative lesions.  No pain with calf compression, swelling, warmth, erythema       Assessment: Ulcerations bilaterally with improvement  Plan: -All treatment options discussed with the patient including all alternatives, risks, complications.  -Lightly debrided some of the hyperkeratotic tissue to the wounds.  Continue repairs ointment dressing changes daily.  Continue surgical shoe and offloading.  Monitoring signs or symptoms of infection.  Finish course of antibiotics. -Patient encouraged to call the office with any questions, concerns, change in symptoms.   Trula Slade DPM

## 2019-07-06 ENCOUNTER — Ambulatory Visit: Payer: Medicare HMO | Admitting: Podiatry

## 2019-08-14 ENCOUNTER — Other Ambulatory Visit: Payer: Self-pay | Admitting: Family Medicine

## 2019-08-14 DIAGNOSIS — H4311 Vitreous hemorrhage, right eye: Secondary | ICD-10-CM | POA: Diagnosis not present

## 2019-08-14 DIAGNOSIS — E113512 Type 2 diabetes mellitus with proliferative diabetic retinopathy with macular edema, left eye: Secondary | ICD-10-CM | POA: Diagnosis not present

## 2019-08-16 ENCOUNTER — Other Ambulatory Visit: Payer: Self-pay | Admitting: Family Medicine

## 2019-08-16 DIAGNOSIS — H4311 Vitreous hemorrhage, right eye: Secondary | ICD-10-CM | POA: Diagnosis not present

## 2019-09-13 DIAGNOSIS — E113512 Type 2 diabetes mellitus with proliferative diabetic retinopathy with macular edema, left eye: Secondary | ICD-10-CM | POA: Diagnosis not present

## 2019-09-13 DIAGNOSIS — E113513 Type 2 diabetes mellitus with proliferative diabetic retinopathy with macular edema, bilateral: Secondary | ICD-10-CM | POA: Diagnosis not present

## 2019-09-13 DIAGNOSIS — E113511 Type 2 diabetes mellitus with proliferative diabetic retinopathy with macular edema, right eye: Secondary | ICD-10-CM | POA: Diagnosis not present

## 2019-09-27 DIAGNOSIS — E113511 Type 2 diabetes mellitus with proliferative diabetic retinopathy with macular edema, right eye: Secondary | ICD-10-CM | POA: Diagnosis not present

## 2019-10-11 DIAGNOSIS — E113212 Type 2 diabetes mellitus with mild nonproliferative diabetic retinopathy with macular edema, left eye: Secondary | ICD-10-CM | POA: Diagnosis not present

## 2019-10-11 DIAGNOSIS — E113511 Type 2 diabetes mellitus with proliferative diabetic retinopathy with macular edema, right eye: Secondary | ICD-10-CM | POA: Diagnosis not present

## 2019-10-13 DIAGNOSIS — H43393 Other vitreous opacities, bilateral: Secondary | ICD-10-CM | POA: Diagnosis not present

## 2019-10-13 DIAGNOSIS — H40033 Anatomical narrow angle, bilateral: Secondary | ICD-10-CM | POA: Diagnosis not present

## 2020-01-23 ENCOUNTER — Emergency Department (HOSPITAL_COMMUNITY)
Admission: EM | Admit: 2020-01-23 | Discharge: 2020-01-23 | Disposition: A | Discharge status: Home / Self Care | Payer: Medicare HMO | Attending: Emergency Medicine | Admitting: Emergency Medicine

## 2020-01-23 ENCOUNTER — Other Ambulatory Visit: Payer: Self-pay

## 2020-01-23 ENCOUNTER — Encounter (HOSPITAL_COMMUNITY): Payer: Self-pay | Admitting: Emergency Medicine

## 2020-01-23 DIAGNOSIS — Z79899 Other long term (current) drug therapy: Secondary | ICD-10-CM | POA: Diagnosis not present

## 2020-01-23 DIAGNOSIS — I129 Hypertensive chronic kidney disease with stage 1 through stage 4 chronic kidney disease, or unspecified chronic kidney disease: Secondary | ICD-10-CM | POA: Insufficient documentation

## 2020-01-23 DIAGNOSIS — E1122 Type 2 diabetes mellitus with diabetic chronic kidney disease: Secondary | ICD-10-CM | POA: Insufficient documentation

## 2020-01-23 DIAGNOSIS — F17210 Nicotine dependence, cigarettes, uncomplicated: Secondary | ICD-10-CM | POA: Diagnosis not present

## 2020-01-23 DIAGNOSIS — Z794 Long term (current) use of insulin: Secondary | ICD-10-CM | POA: Insufficient documentation

## 2020-01-23 DIAGNOSIS — I10 Essential (primary) hypertension: Secondary | ICD-10-CM | POA: Diagnosis not present

## 2020-01-23 DIAGNOSIS — H5712 Ocular pain, left eye: Secondary | ICD-10-CM | POA: Insufficient documentation

## 2020-01-23 DIAGNOSIS — N184 Chronic kidney disease, stage 4 (severe): Secondary | ICD-10-CM | POA: Insufficient documentation

## 2020-01-23 MED ORDER — TETRACAINE HCL 0.5 % OP SOLN
2.0000 [drp] | Freq: Once | OPHTHALMIC | Status: AC
  Administered 2020-01-23: 2 [drp] via OPHTHALMIC
  Filled 2020-01-23: qty 4

## 2020-01-23 MED ORDER — FLUORESCEIN SODIUM 1 MG OP STRP
1.0000 | ORAL_STRIP | Freq: Once | OPHTHALMIC | Status: AC
  Administered 2020-01-23: 1 via OPHTHALMIC
  Filled 2020-01-23: qty 1

## 2020-01-23 MED ORDER — ERYTHROMYCIN 5 MG/GM OP OINT: TOPICAL_OINTMENT | OPHTHALMIC | 0 refills | Status: DC

## 2020-01-23 NOTE — Discharge Instructions (Addendum)
You are seen in the emergency department for evaluation of left eye pain.  We checked your eye pressure and it was normal.  We did not see an obvious foreign body.  We are prescribing you antibiotic ointment to use 3 times a day.  Please contact your eye doctor for close follow-up.  Your blood pressure was also very elevated here and will need to be followed up with by your primary care doctor.  Return to the emergency department if any worsening or concerning symptoms

## 2020-01-23 NOTE — ED Triage Notes (Signed)
Pt reports that she has had L eye pain x1 week and feels like something is in her eye.

## 2020-01-23 NOTE — ED Provider Notes (Signed)
[facility] EMERGENCY DEPARTMENT Provider Note   CSN: [id number] Arrival date & time: 01/23/20  1042     History Chief Complaint  Patient presents with   Eye Pain    Sara Davis is a 69 y.o. female.  She is complaining of foreign body sensation to the left eye that is been going on a week.  She does not recall any trauma.  Associated with some pain and blurry vision.  Uses glasses.  Has tried nothing for it.  Blood pressure elevated here history of same.  The history is provided by the patient.  Eye Pain This is a new problem. The current episode started more than 1 week ago. The problem occurs constantly. The problem has not changed since onset.Pertinent negatives include no chest pain, no abdominal pain, no headaches and no shortness of breath. Nothing aggravates the symptoms. Nothing relieves the symptoms. She has tried nothing for the symptoms. The treatment provided no relief.       Past Medical History:  Diagnosis Date   Cellulitis of scalp 02/2015   Chronic kidney disease    Depression    Diabetes mellitus without complication ([place])    Hyperlipidemia    Hypertension     Patient Active Problem List   Diagnosis Date Noted   Chronic constipation 05/09/2019   Appetite absent 05/09/2019   Recent unintentional weight loss over several months 01/60/1093   Complicated grief 23/55/7322   Chronic kidney disease (CKD), stage IV (severe) ([place]) 05/09/2019   Type 2 diabetes mellitus without complication, with long-term current use of insulin ([facility])    Dyspnea on exertion 12/13/2018   Low back pain 12/13/2018   Emphysema of lung ([place]) 03/07/2018   Acute gastric ulcer without hemorrhage or perforation    Angiodysplasia of duodenum    Benign neoplasm of transverse colon    Benign neoplasm of sigmoid colon    CHF exacerbation ([place]) 01/06/2018   Bilateral leg edema 01/06/2018   Pancreatic cyst 12/03/2016   Symptomatic anemia  12/03/2016   Protein-calorie malnutrition, severe 11/25/2016   Chest pain    Onychomycosis 07/30/2015   Essential hypertension    Headache 04/18/2014   Insomnia 07/31/2013   Uncontrolled type 2 diabetes mellitus ([place]) 01/14/2012   Tobacco abuse 01/14/2012    Past Surgical History:  Procedure Laterality Date   ABDOMINAL HYSTERECTOMY     BIOPSY  01/07/2018   Procedure: BIOPSY;  Surgeon: Irene Shipper, MD;  Location: [place];  Service: Endoscopy;;  Clotest   COLONOSCOPY WITH PROPOFOL N/A 01/07/2018   Procedure: COLONOSCOPY WITH PROPOFOL;  Surgeon: Irene Shipper, MD;  Location: [facility] ENDOSCOPY;  Service: Endoscopy;  Laterality: N/A;   ESOPHAGOGASTRODUODENOSCOPY (EGD) WITH PROPOFOL N/A 01/07/2018   Procedure: ESOPHAGOGASTRODUODENOSCOPY (EGD) WITH PROPOFOL;  Surgeon: Irene Shipper, MD;  Location: [facility] ENDOSCOPY;  Service: Endoscopy;  Laterality: N/A;   ESOPHAGOGASTRODUODENOSCOPY (EGD) WITH PROPOFOL N/A 12/14/2018   Procedure: ESOPHAGOGASTRODUODENOSCOPY (EGD) WITH PROPOFOL;  Surgeon: Irene Shipper, MD;  Location: [facility] ENDOSCOPY;  Service: Endoscopy;  Laterality: N/A;   HEMOSTASIS CONTROL  12/14/2018   Procedure: HEMOSTASIS CONTROL;  Surgeon: Irene Shipper, MD;  Location: [facility] ENDOSCOPY;  Service: Endoscopy;;   POLYPECTOMY  01/07/2018   Procedure: POLYPECTOMY;  Surgeon: Irene Shipper, MD;  Location: [facility] ENDOSCOPY;  Service: Endoscopy;;     OB History   No obstetric history on file.     Family History  Problem Relation Age of Onset   Diabetes Mother  Cancer Mother    Diabetes Sister    Hypertension Sister    Cancer Sister 2       Bone   Diabetes Brother    Diabetes Sister    Diabetes Sister     Social History   Tobacco Use   Smoking status: Current Every Day Smoker    Packs/day: 0.50    Years: 40.00    Pack years: 20.00    Types: Cigarettes   Smokeless tobacco: Never Used   Tobacco comment: 6 cigs per day  Vaping Use   Vaping Use: Never used   Substance Use Topics   Alcohol use: No    Alcohol/week: 0.0 standard drinks   Drug use: No    Home Medications Prior to Admission medications   Medication Sig Start Date End Date Taking? Authorizing Provider  Alcohol Swabs (B-D SINGLE USE SWABS REGULAR) PADS 1 Units by Does not apply route 3 (three) times daily as needed. 07/14/18   [name], [name], DO  alendronate (FOSAMAX) 70 MG tablet Take 1 tablet (70 mg total) by mouth every 7 (seven) days. Take with a full glass of water on an empty stomach. 05/09/19   Wendall Mola, NP  amLODipine (NORVASC) 10 MG tablet Take 1 tablet (10 mg total) by mouth daily. 12/20/18   Wilber Oliphant, MD  atorvastatin (LIPITOR) 80 MG tablet Take 1 tablet (80 mg total) by mouth daily. 12/20/18   Wilber Oliphant, MD  Blood Glucose Calibration (ACCU-CHEK AVIVA) SOLN 1 Units by In Vitro route 3 (three) times daily as needed. 07/14/18   [name], [name], DO  calcitRIOL (ROCALTROL) 0.25 MCG capsule  01/06/19   [provider]  carvedilol (COREG) 25 MG tablet Take 1 tablet (25 mg total) by mouth 2 (two) times daily with a meal. 05/09/19   Wendall Mola, NP  diltiazem (CARDIZEM SR) 120 MG 12 hr capsule Take 1 capsule (120 mg total) by mouth 2 (two) times daily. 05/09/19 06/08/19  Wendall Mola, NP  doxycycline (VIBRA-TABS) 100 MG tablet Take 1 tablet (100 mg total) by mouth 2 (two) times daily. 06/13/19   Trula Slade, DPM  ferrous sulfate 325 (65 FE) MG tablet Take 1 tablet (325 mg total) by mouth every other day. 12/20/18   Wilber Oliphant, MD  glucose blood (ACCU-CHEK AVIVA PLUS) test strip Use as instructed 07/14/18   [name], [name], DO  insulin glargine (LANTUS) 100 UNIT/ML injection Inject 0.2 mLs (20 Units total) into the skin at bedtime. 05/09/19 06/08/19  Wendall Mola, NP  insulin NPH-regular Human (70-30) 100 UNIT/ML injection Inject 22 Units into the skin 2 (two) times daily. Patient taking differently: Inject 20 Units into the skin 2 (two)  times daily.  04/01/18   [name], [name], DO  Insulin Pen Needle (B-D ULTRAFINE III SHORT PEN) 31G X 8 MM MISC CHECK BLOOD SUGARS ONCE IN MORNING BEFORE FOOD AND ONCE IN THE EVENING. 07/14/18   Enid Derry, [name], DO  Lancets (ACCU-CHEK SOFT TOUCH) lancets Use as instructed 07/14/18   [name], [name], DO  mupirocin ointment (BACTROBAN) 2 % Apply 1 application topically 2 (two) times daily. 06/13/19   Trula Slade, DPM  psyllium (METAMUCIL SMOOTH TEXTURE) 58.6 % powder Take 1 packet by mouth 3 (three) times daily. 05/09/19   Wendall Mola, NP  Semaglutide,0.25 or 0.5MG /DOS, (OZEMPIC, 0.25 OR 0.5 MG/DOSE,) 2 MG/1.5ML SOPN Inject 0.25 mg into the skin once a week. 12/20/18   Wilber Oliphant, MD  vitamin  B-12 (CYANOCOBALAMIN) 1000 MCG tablet Take 1 tablet (1,000 mcg total) by mouth daily. 01/02/19   Orson Slick, MD  amLODipine (NORVASC) 10 MG tablet Take 1 tablet (10 mg total) by mouth daily. 12/16/18   Daisy Floro, DO    Allergies    Aspirin  Review of Systems   Review of Systems  Constitutional: Negative for fever.  HENT: Negative for sore throat.   Eyes: Positive for pain and visual disturbance.  Respiratory: Negative for shortness of breath.   Cardiovascular: Negative for chest pain.  Gastrointestinal: Negative for abdominal pain.  Genitourinary: Negative for dysuria.  Musculoskeletal: Negative for neck pain.  Skin: Negative for rash.  Neurological: Negative for headaches.    Physical Exam Updated Vital Signs BP (!) 210/93    Pulse 73    Temp (!) 97.5 F (36.4 C)    Resp 15    SpO2 100%   Physical Exam Vitals and nursing note reviewed.  Constitutional:      General: She is not in acute distress.    Appearance: She is well-developed and well-nourished.  HENT:     Head: Normocephalic and atraumatic.  Eyes:     Extraocular Movements: Extraocular movements intact.     Pupils: Pupils are equal, round, and reactive to light.     Comments: Left eye upper lid swollen.   Pupils symmetric at 2 mm slightly reactive.  Anterior chamber clear and deep.  Conjunctiva slightly injected on the left.  Extraocular movements intact. Unable to invert upper lid secondary to patient's pain.  No foreign body noticed on the lower lid.  No palpable lesion noted in upper lid although moderately edematous.  Fluorescein with negative uptake.  Tono-Pen pressure of 9.  Patient states after the topical anesthetic her foreign body sensation has resolved.  We will put her on some antibiotic ointment and she says she has an eye doctor to follow-up with.  Return instructions discussed.  Cardiovascular:     Rate and Rhythm: Normal rate and regular rhythm.     Heart sounds: No murmur heard.   Pulmonary:     Effort: Pulmonary effort is normal. No respiratory distress.     Breath sounds: Normal breath sounds.  Abdominal:     Palpations: Abdomen is soft.     Tenderness: There is no abdominal tenderness.  Musculoskeletal:        General: No edema.     Cervical back: Neck supple.  Skin:    General: Skin is warm and dry.  Neurological:     Mental Status: She is alert.  Psychiatric:        Mood and Affect: Mood and affect normal.     ED Results / Procedures / Treatments   Labs (all labs ordered are listed, but only abnormal results are displayed) Labs Reviewed - No data to display  EKG None  Radiology No results found.  Procedures Procedures (including critical care time)  Medications Ordered in ED Medications  fluorescein ophthalmic strip 1 strip (has no administration in time range)  tetracaine (PONTOCAINE) 0.5 % ophthalmic solution 2 drop (has no administration in time range)    ED Course  I have reviewed the triage vital signs and the nursing notes.  Pertinent labs & imaging results that were available during my care of the patient were reviewed by me and considered in my medical decision making (see chart for details).    MDM Rules/Calculators/A&P  Differential diagnosis includes retained foreign body, corneal abrasion, glaucoma, allergic reaction.  Blood pressure markedly elevated here, patient states she had not taken her blood pressure medicines.  Intraocular pressure not elevated.  No obvious foreign body on exam.  Visual acuity reasonable for not having her glasses.  Will trial with topical antibiotics and have her follow-up with her eye doctor.  Return instructions discussed.  Final Clinical Impression(s) / ED Diagnoses Final diagnoses:  Left eye pain  Hypertension, unspecified type    Rx / DC Orders ED Discharge Orders         Ordered    erythromycin ophthalmic ointment        01/23/20 1357           Hayden Rasmussen, MD 01/23/20 1907

## 2020-04-17 DIAGNOSIS — E113512 Type 2 diabetes mellitus with proliferative diabetic retinopathy with macular edema, left eye: Secondary | ICD-10-CM | POA: Diagnosis not present

## 2020-04-17 DIAGNOSIS — E113511 Type 2 diabetes mellitus with proliferative diabetic retinopathy with macular edema, right eye: Secondary | ICD-10-CM | POA: Diagnosis not present

## 2020-04-17 DIAGNOSIS — E113513 Type 2 diabetes mellitus with proliferative diabetic retinopathy with macular edema, bilateral: Secondary | ICD-10-CM | POA: Diagnosis not present

## 2020-05-08 DIAGNOSIS — E113513 Type 2 diabetes mellitus with proliferative diabetic retinopathy with macular edema, bilateral: Secondary | ICD-10-CM | POA: Diagnosis not present

## 2020-06-05 DIAGNOSIS — E113513 Type 2 diabetes mellitus with proliferative diabetic retinopathy with macular edema, bilateral: Secondary | ICD-10-CM | POA: Diagnosis not present

## 2020-07-05 DIAGNOSIS — E113513 Type 2 diabetes mellitus with proliferative diabetic retinopathy with macular edema, bilateral: Secondary | ICD-10-CM | POA: Diagnosis not present

## 2020-08-07 DIAGNOSIS — E113513 Type 2 diabetes mellitus with proliferative diabetic retinopathy with macular edema, bilateral: Secondary | ICD-10-CM | POA: Diagnosis not present

## 2020-09-18 DIAGNOSIS — E113513 Type 2 diabetes mellitus with proliferative diabetic retinopathy with macular edema, bilateral: Secondary | ICD-10-CM | POA: Diagnosis not present

## 2020-09-18 DIAGNOSIS — E113511 Type 2 diabetes mellitus with proliferative diabetic retinopathy with macular edema, right eye: Secondary | ICD-10-CM | POA: Diagnosis not present

## 2020-09-18 DIAGNOSIS — E113512 Type 2 diabetes mellitus with proliferative diabetic retinopathy with macular edema, left eye: Secondary | ICD-10-CM | POA: Diagnosis not present

## 2020-10-25 DIAGNOSIS — E113513 Type 2 diabetes mellitus with proliferative diabetic retinopathy with macular edema, bilateral: Secondary | ICD-10-CM | POA: Diagnosis not present

## 2020-10-25 DIAGNOSIS — I509 Heart failure, unspecified: Secondary | ICD-10-CM | POA: Diagnosis not present

## 2020-10-25 DIAGNOSIS — E113512 Type 2 diabetes mellitus with proliferative diabetic retinopathy with macular edema, left eye: Secondary | ICD-10-CM | POA: Diagnosis not present

## 2020-10-25 DIAGNOSIS — E113511 Type 2 diabetes mellitus with proliferative diabetic retinopathy with macular edema, right eye: Secondary | ICD-10-CM | POA: Diagnosis not present

## 2020-12-23 ENCOUNTER — Other Ambulatory Visit: Payer: Self-pay

## 2020-12-23 DIAGNOSIS — N186 End stage renal disease: Secondary | ICD-10-CM

## 2021-01-20 DIAGNOSIS — N189 Chronic kidney disease, unspecified: Secondary | ICD-10-CM

## 2021-01-20 HISTORY — DX: Chronic kidney disease, unspecified: N18.9

## 2021-01-27 NOTE — H&P (View-Only) (Signed)
[facility] [place]  ASSESSMENT / PLAN: Sara Davis is a 70 y.o. left handed female in need of permanent hemodialysis access. I reviewed options for dialysis in detail with the patient. I counseled the patient that dialysis access requires surveillance and periodic maintenance. Plan to proceed with right upper extremity arteriovenous graft.   CHIEF COMPLAINT: Deteriorating renal function  HISTORY OF PRESENT ILLNESS: Sara Davis is a 70 y.o. female who presents to clinic for evaluation of permanent dialysis access.  She has never had dialysis access before.  She was previously evaluated by Dr. Donnetta Hutching in 2021, and found to have no usable vein for arteriovenous fistula creation.  She was counseled to wait until immediately prior to needing dialysis before AV graft creation.  She has now deteriorated to the point where she needs dialysis.  Past Medical History:  Diagnosis Date   Cellulitis of scalp 02/2015   Chronic kidney disease    Depression    Diabetes mellitus without complication ([place])    Hyperlipidemia    Hypertension     Past Surgical History:  Procedure Laterality Date   ABDOMINAL HYSTERECTOMY     BIOPSY  01/07/2018   Procedure: BIOPSY;  Surgeon: Irene Shipper, MD;  Location: [facility] ENDOSCOPY;  Service: Endoscopy;;  Clotest   COLONOSCOPY WITH PROPOFOL N/A 01/07/2018   Procedure: COLONOSCOPY WITH PROPOFOL;  Surgeon: Irene Shipper, MD;  Location: [facility] ENDOSCOPY;  Service: Endoscopy;  Laterality: N/A;   ESOPHAGOGASTRODUODENOSCOPY (EGD) WITH PROPOFOL N/A 01/07/2018   Procedure: ESOPHAGOGASTRODUODENOSCOPY (EGD) WITH PROPOFOL;  Surgeon: Irene Shipper, MD;  Location: [facility] ENDOSCOPY;  Service: Endoscopy;  Laterality: N/A;   ESOPHAGOGASTRODUODENOSCOPY (EGD) WITH PROPOFOL N/A 12/14/2018   Procedure: ESOPHAGOGASTRODUODENOSCOPY (EGD) WITH PROPOFOL;  Surgeon: Irene Shipper, MD;  Location: [facility] ENDOSCOPY;  Service: Endoscopy;  Laterality: N/A;   HEMOSTASIS CONTROL  12/14/2018    Procedure: HEMOSTASIS CONTROL;  Surgeon: Irene Shipper, MD;  Location: [facility] ENDOSCOPY;  Service: Endoscopy;;   POLYPECTOMY  01/07/2018   Procedure: POLYPECTOMY;  Surgeon: Irene Shipper, MD;  Location: [facility] ENDOSCOPY;  Service: Endoscopy;;    Family History  Problem Relation Age of Onset   Diabetes Mother    Cancer Mother    Diabetes Sister    Hypertension Sister    Cancer Sister 17       Bone   Diabetes Brother    Diabetes Sister    Diabetes Sister     Social History   Socioeconomic History   Marital status: Single    Spouse name: Not on file   Number of children: 2   Years of education: Not on file   Highest education level: Not on file  Occupational History   Occupation: Retired  Tobacco Use   Smoking status: Every Day    Packs/day: 0.50    Years: 40.00    Pack years: 20.00    Types: Cigarettes   Smokeless tobacco: Never   Tobacco comments:    6 cigs per day  Vaping Use   Vaping Use: Never used  Substance and Sexual Activity   Alcohol use: No    Alcohol/week: 0.0 standard drinks   Drug use: No   Sexual activity: Not on file  Other Topics Concern   Not on file  Social History Narrative   Not on file   Social Determinants of Health   Financial Resource Strain: Not on file  Food Insecurity: Not on file  Transportation Needs: Not on file  Physical  Activity: Not on file  Stress: Not on file  Social Connections: Not on file  Intimate Partner Violence: Not on file    Allergies  Allergen Reactions   Aspirin Other (See Comments)    Upset stomach, can only take coated aspirin but prefers to not take it at all    Current Outpatient Medications  Medication Sig Dispense Refill   Alcohol Swabs (B-D SINGLE USE SWABS REGULAR) PADS 1 Units by Does not apply route 3 (three) times daily as needed. 90 each 2   alendronate (FOSAMAX) 70 MG tablet Take 1 tablet (70 mg total) by mouth every 7 (seven) days. Take with a full glass of water on an empty stomach. 4 tablet 11    amLODipine (NORVASC) 10 MG tablet Take 1 tablet (10 mg total) by mouth daily. 90 tablet 1   atorvastatin (LIPITOR) 80 MG tablet Take 1 tablet (80 mg total) by mouth daily. 90 tablet 1   Blood Glucose Calibration (ACCU-CHEK AVIVA) SOLN 1 Units by In Vitro route 3 (three) times daily as needed. 90 each 0   calcitRIOL (ROCALTROL) 0.25 MCG capsule      carvedilol (COREG) 25 MG tablet Take 1 tablet (25 mg total) by mouth 2 (two) times daily with a meal. 180 tablet 3   diltiazem (CARDIZEM SR) 120 MG 12 hr capsule Take 1 capsule (120 mg total) by mouth 2 (two) times daily. 60 capsule 0   doxycycline (VIBRA-TABS) 100 MG tablet Take 1 tablet (100 mg total) by mouth 2 (two) times daily. 20 tablet 0   erythromycin ophthalmic ointment Place a 1/2 inch ribbon of ointment into the lower eyelid 3 times a day for 5 days 3.5 g 0   ferrous sulfate 325 (65 FE) MG tablet Take 1 tablet (325 mg total) by mouth every other day. 45 tablet 1   glucose blood (ACCU-CHEK AVIVA PLUS) test strip Use as instructed 100 each 12   insulin glargine (LANTUS) 100 UNIT/ML injection Inject 0.2 mLs (20 Units total) into the skin at bedtime. 6 mL 0   insulin NPH-regular Human (70-30) 100 UNIT/ML injection Inject 22 Units into the skin 2 (two) times daily. (Patient taking differently: Inject 20 Units into the skin 2 (two) times daily. ) 1 vial 2   Insulin Pen Needle (B-D ULTRAFINE III SHORT PEN) 31G X 8 MM MISC CHECK BLOOD SUGARS ONCE IN MORNING BEFORE FOOD AND ONCE IN THE EVENING. 100 each 0   Lancets (ACCU-CHEK SOFT TOUCH) lancets Use as instructed 100 each 12   mupirocin ointment (BACTROBAN) 2 % Apply 1 application topically 2 (two) times daily. 30 g 2   psyllium (METAMUCIL SMOOTH TEXTURE) 58.6 % powder Take 1 packet by mouth 3 (three) times daily. 283 g 12   Semaglutide,0.25 or 0.5MG /DOS, (OZEMPIC, 0.25 OR 0.5 MG/DOSE,) 2 MG/1.5ML SOPN Inject 0.25 mg into the skin once a week. 1 pen 0   vitamin B-12 (CYANOCOBALAMIN) 1000 MCG tablet Take  1 tablet (1,000 mcg total) by mouth daily. 90 tablet 3   No current facility-administered medications for this visit.    REVIEW OF SYSTEMS:  [X]  denotes positive finding, [ ]  denotes negative finding Cardiac  Comments:  Chest pain or chest pressure:    Shortness of breath upon exertion:    Short of breath when lying flat:    Irregular heart rhythm:        Vascular    Pain in calf, thigh, or hip brought on by ambulation:    Pain  in feet at night that wakes you up from your sleep:     Blood clot in your veins:    Leg swelling:         Pulmonary    Oxygen at home:    Productive cough:     Wheezing:         Neurologic    Sudden weakness in arms or legs:     Sudden numbness in arms or legs:     Sudden onset of difficulty speaking or slurred speech:    Temporary loss of vision in one eye:     Problems with dizziness:         Gastrointestinal    Blood in stool:     Vomited blood:         Genitourinary    Burning when urinating:     Blood in urine:        Psychiatric    Major depression:         Hematologic    Bleeding problems:    Problems with blood clotting too easily:        Skin    Rashes or ulcers:        Constitutional    Fever or chills:      PHYSICAL EXAM Vitals:   01/28/21 1059  BP: (!) 159/72  Pulse: 61  Resp: 20  Temp: 97.8 F (36.6 C)  SpO2: 100%  Weight: 135 lb (61.2 kg)  Height: 5\' 6"  (1.676 m)    Constitutional: well appearing. no distress. Appears well nourished.  Neurologic: CN intact. no focal findings. no sensory loss. Psychiatric:  Mood and affect symmetric and appropriate. Eyes:  No icterus. No conjunctival pallor. Ears, nose, throat:  mucous membranes moist. Midline trachea.  Cardiac: regular rate and rhythm.  Respiratory:  unlabored. Abdominal:  soft, non-tender, non-distended.  Peripheral vascular: 2+ radial pulses Extremity: no edema. no cyanosis. no pallor.  Skin: no gangrene. no ulceration.  Lymphatic: no Stemmer's sign.  no palpable lymphadenopathy.  PERTINENT LABORATORY AND RADIOLOGIC DATA  Most recent CBC CBC Latest Ref Rng & Units 05/09/2019 12/23/2018 12/20/2018  WBC 3.4 - 10.8 x10E3/uL 7.0 9.8 8.0  Hemoglobin 11.1 - 15.9 g/dL 10.5(L) 9.4(L) 9.3(L)  Hematocrit 34.0 - 46.6 % 31.8(L) 29.5(L) 28.6(L)  Platelets 150 - 450 x10E3/uL 347 241 234     Most recent CMP CMP Latest Ref Rng & Units 05/09/2019 12/23/2018 12/20/2018  Glucose 65 - 99 mg/dL 140(H) 226(H) 92  BUN 8 - 27 mg/dL 23 31(H) 32(H)  Creatinine 0.57 - 1.00 mg/dL 2.99(H) 3.53(HH) 3.27(H)  Sodium 134 - 144 mmol/L 142 142 143  Potassium 3.5 - 5.2 mmol/L 4.3 5.1 4.6  Chloride 96 - 106 mmol/L 113(H) 117(H) 113(H)  CO2 20 - 29 mmol/L 14(LL) 18(L) 14(L)  Calcium 8.7 - 10.3 mg/dL 10.0 9.4 9.6  Total Protein 6.0 - 8.5 g/dL 6.3 6.2(L) -  Total Bilirubin 0.0 - 1.2 mg/dL 0.2 0.3 -  Alkaline Phos 39 - 117 IU/L 152(H) 129(H) -  AST 0 - 40 IU/L 10 12(L) -  ALT 0 - 32 IU/L 5 9 -    Renal function CrCl cannot be calculated (Patient's most recent lab result is older than the maximum 21 days allowed.).  HbA1c, POC (controlled diabetic range) (%)  Date Value  12/12/2018 7.8 (A)   Hgb A1c MFr Bld (%)  Date Value  05/09/2019 6.9 (H)    LDL Cholesterol  Date Value Ref Range Status  11/24/2016 13  0 - 99 mg/dL Final    Comment:           Total Cholesterol/HDL:CHD Risk Coronary Heart Disease Risk Table                     Men   Women  1/2 Average Risk   3.4   3.3  Average Risk       5.0   4.4  2 X Average Risk   9.6   7.1  3 X Average Risk  23.4   11.0        Use the calculated Patient Ratio above and the CHD Risk Table to determine the patient's CHD Risk.        ATP III CLASSIFICATION (LDL):  <100     mg/dL   Optimal  100-129  mg/dL   Near or Above                    Optimal  130-159  mg/dL   Borderline  160-189  mg/dL   High  >190     mg/dL   Very High     Upper extremity venous duplex reviewed No usable vein for arteriovenous  fistula  Yevonne Aline. Stanford Breed, MD [facility] Phone Number: [phone] 01/27/2021 1:20 PM  Total time spent on preparing this encounter including chart review, data review, collecting history, examining the patient, coordinating care for this established patient, 40 minutes.  Portions of this report may have been transcribed using voice recognition software.  Every effort has been made to ensure accuracy; however, inadvertent computerized transcription errors may still be present.

## 2021-01-27 NOTE — Progress Notes (Signed)
[facility] [place]  ASSESSMENT / PLAN: Sara Davis is a 70 y.o. left handed female in need of permanent hemodialysis access. I reviewed options for dialysis in detail with the patient. I counseled the patient that dialysis access requires surveillance and periodic maintenance. Plan to proceed with right upper extremity arteriovenous graft.   CHIEF COMPLAINT: Deteriorating renal function  HISTORY OF PRESENT ILLNESS: Sara Davis is a 70 y.o. female who presents to clinic for evaluation of permanent dialysis access.  She has never had dialysis access before.  She was previously evaluated by Dr. Donnetta Hutching in 2021, and found to have no usable vein for arteriovenous fistula creation.  She was counseled to wait until immediately prior to needing dialysis before AV graft creation.  She has now deteriorated to the point where she needs dialysis.  Past Medical History:  Diagnosis Date   Cellulitis of scalp 02/2015   Chronic kidney disease    Depression    Diabetes mellitus without complication ([place])    Hyperlipidemia    Hypertension     Past Surgical History:  Procedure Laterality Date   ABDOMINAL HYSTERECTOMY     BIOPSY  01/07/2018   Procedure: BIOPSY;  Surgeon: Irene Shipper, MD;  Location: [facility] ENDOSCOPY;  Service: Endoscopy;;  Clotest   COLONOSCOPY WITH PROPOFOL N/A 01/07/2018   Procedure: COLONOSCOPY WITH PROPOFOL;  Surgeon: Irene Shipper, MD;  Location: [facility] ENDOSCOPY;  Service: Endoscopy;  Laterality: N/A;   ESOPHAGOGASTRODUODENOSCOPY (EGD) WITH PROPOFOL N/A 01/07/2018   Procedure: ESOPHAGOGASTRODUODENOSCOPY (EGD) WITH PROPOFOL;  Surgeon: Irene Shipper, MD;  Location: [facility] ENDOSCOPY;  Service: Endoscopy;  Laterality: N/A;   ESOPHAGOGASTRODUODENOSCOPY (EGD) WITH PROPOFOL N/A 12/14/2018   Procedure: ESOPHAGOGASTRODUODENOSCOPY (EGD) WITH PROPOFOL;  Surgeon: Irene Shipper, MD;  Location: [facility] ENDOSCOPY;  Service: Endoscopy;  Laterality: N/A;   HEMOSTASIS CONTROL  12/14/2018    Procedure: HEMOSTASIS CONTROL;  Surgeon: Irene Shipper, MD;  Location: [facility] ENDOSCOPY;  Service: Endoscopy;;   POLYPECTOMY  01/07/2018   Procedure: POLYPECTOMY;  Surgeon: Irene Shipper, MD;  Location: [facility] ENDOSCOPY;  Service: Endoscopy;;    Family History  Problem Relation Age of Onset   Diabetes Mother    Cancer Mother    Diabetes Sister    Hypertension Sister    Cancer Sister 7       Bone   Diabetes Brother    Diabetes Sister    Diabetes Sister     Social History   Socioeconomic History   Marital status: Single    Spouse name: Not on file   Number of children: 2   Years of education: Not on file   Highest education level: Not on file  Occupational History   Occupation: Retired  Tobacco Use   Smoking status: Every Day    Packs/day: 0.50    Years: 40.00    Pack years: 20.00    Types: Cigarettes   Smokeless tobacco: Never   Tobacco comments:    6 cigs per day  Vaping Use   Vaping Use: Never used  Substance and Sexual Activity   Alcohol use: No    Alcohol/week: 0.0 standard drinks   Drug use: No   Sexual activity: Not on file  Other Topics Concern   Not on file  Social History Narrative   Not on file   Social Determinants of Health   Financial Resource Strain: Not on file  Food Insecurity: Not on file  Transportation Needs: Not on file  Physical  Activity: Not on file  Stress: Not on file  Social Connections: Not on file  Intimate Partner Violence: Not on file    Allergies  Allergen Reactions   Aspirin Other (See Comments)    Upset stomach, can only take coated aspirin but prefers to not take it at all    Current Outpatient Medications  Medication Sig Dispense Refill   Alcohol Swabs (B-D SINGLE USE SWABS REGULAR) PADS 1 Units by Does not apply route 3 (three) times daily as needed. 90 each 2   alendronate (FOSAMAX) 70 MG tablet Take 1 tablet (70 mg total) by mouth every 7 (seven) days. Take with a full glass of water on an empty stomach. 4 tablet 11    amLODipine (NORVASC) 10 MG tablet Take 1 tablet (10 mg total) by mouth daily. 90 tablet 1   atorvastatin (LIPITOR) 80 MG tablet Take 1 tablet (80 mg total) by mouth daily. 90 tablet 1   Blood Glucose Calibration (ACCU-CHEK AVIVA) SOLN 1 Units by In Vitro route 3 (three) times daily as needed. 90 each 0   calcitRIOL (ROCALTROL) 0.25 MCG capsule      carvedilol (COREG) 25 MG tablet Take 1 tablet (25 mg total) by mouth 2 (two) times daily with a meal. 180 tablet 3   diltiazem (CARDIZEM SR) 120 MG 12 hr capsule Take 1 capsule (120 mg total) by mouth 2 (two) times daily. 60 capsule 0   doxycycline (VIBRA-TABS) 100 MG tablet Take 1 tablet (100 mg total) by mouth 2 (two) times daily. 20 tablet 0   erythromycin ophthalmic ointment Place a 1/2 inch ribbon of ointment into the lower eyelid 3 times a day for 5 days 3.5 g 0   ferrous sulfate 325 (65 FE) MG tablet Take 1 tablet (325 mg total) by mouth every other day. 45 tablet 1   glucose blood (ACCU-CHEK AVIVA PLUS) test strip Use as instructed 100 each 12   insulin glargine (LANTUS) 100 UNIT/ML injection Inject 0.2 mLs (20 Units total) into the skin at bedtime. 6 mL 0   insulin NPH-regular Human (70-30) 100 UNIT/ML injection Inject 22 Units into the skin 2 (two) times daily. (Patient taking differently: Inject 20 Units into the skin 2 (two) times daily. ) 1 vial 2   Insulin Pen Needle (B-D ULTRAFINE III SHORT PEN) 31G X 8 MM MISC CHECK BLOOD SUGARS ONCE IN MORNING BEFORE FOOD AND ONCE IN THE EVENING. 100 each 0   Lancets (ACCU-CHEK SOFT TOUCH) lancets Use as instructed 100 each 12   mupirocin ointment (BACTROBAN) 2 % Apply 1 application topically 2 (two) times daily. 30 g 2   psyllium (METAMUCIL SMOOTH TEXTURE) 58.6 % powder Take 1 packet by mouth 3 (three) times daily. 283 g 12   Semaglutide,0.25 or 0.5MG /DOS, (OZEMPIC, 0.25 OR 0.5 MG/DOSE,) 2 MG/1.5ML SOPN Inject 0.25 mg into the skin once a week. 1 pen 0   vitamin B-12 (CYANOCOBALAMIN) 1000 MCG tablet Take  1 tablet (1,000 mcg total) by mouth daily. 90 tablet 3   No current facility-administered medications for this visit.    REVIEW OF SYSTEMS:  [X]  denotes positive finding, [ ]  denotes negative finding Cardiac  Comments:  Chest pain or chest pressure:    Shortness of breath upon exertion:    Short of breath when lying flat:    Irregular heart rhythm:        Vascular    Pain in calf, thigh, or hip brought on by ambulation:    Pain  in feet at night that wakes you up from your sleep:     Blood clot in your veins:    Leg swelling:         Pulmonary    Oxygen at home:    Productive cough:     Wheezing:         Neurologic    Sudden weakness in arms or legs:     Sudden numbness in arms or legs:     Sudden onset of difficulty speaking or slurred speech:    Temporary loss of vision in one eye:     Problems with dizziness:         Gastrointestinal    Blood in stool:     Vomited blood:         Genitourinary    Burning when urinating:     Blood in urine:        Psychiatric    Major depression:         Hematologic    Bleeding problems:    Problems with blood clotting too easily:        Skin    Rashes or ulcers:        Constitutional    Fever or chills:      PHYSICAL EXAM Vitals:   01/28/21 1059  BP: (!) 159/72  Pulse: 61  Resp: 20  Temp: 97.8 F (36.6 C)  SpO2: 100%  Weight: 135 lb (61.2 kg)  Height: 5\' 6"  (1.676 m)    Constitutional: well appearing. no distress. Appears well nourished.  Neurologic: CN intact. no focal findings. no sensory loss. Psychiatric:  Mood and affect symmetric and appropriate. Eyes:  No icterus. No conjunctival pallor. Ears, nose, throat:  mucous membranes moist. Midline trachea.  Cardiac: regular rate and rhythm.  Respiratory:  unlabored. Abdominal:  soft, non-tender, non-distended.  Peripheral vascular: 2+ radial pulses Extremity: no edema. no cyanosis. no pallor.  Skin: no gangrene. no ulceration.  Lymphatic: no Stemmer's sign.  no palpable lymphadenopathy.  PERTINENT LABORATORY AND RADIOLOGIC DATA  Most recent CBC CBC Latest Ref Rng & Units 05/09/2019 12/23/2018 12/20/2018  WBC 3.4 - 10.8 x10E3/uL 7.0 9.8 8.0  Hemoglobin 11.1 - 15.9 g/dL 10.5(L) 9.4(L) 9.3(L)  Hematocrit 34.0 - 46.6 % 31.8(L) 29.5(L) 28.6(L)  Platelets 150 - 450 x10E3/uL 347 241 234     Most recent CMP CMP Latest Ref Rng & Units 05/09/2019 12/23/2018 12/20/2018  Glucose 65 - 99 mg/dL 140(H) 226(H) 92  BUN 8 - 27 mg/dL 23 31(H) 32(H)  Creatinine 0.57 - 1.00 mg/dL 2.99(H) 3.53(HH) 3.27(H)  Sodium 134 - 144 mmol/L 142 142 143  Potassium 3.5 - 5.2 mmol/L 4.3 5.1 4.6  Chloride 96 - 106 mmol/L 113(H) 117(H) 113(H)  CO2 20 - 29 mmol/L 14(LL) 18(L) 14(L)  Calcium 8.7 - 10.3 mg/dL 10.0 9.4 9.6  Total Protein 6.0 - 8.5 g/dL 6.3 6.2(L) -  Total Bilirubin 0.0 - 1.2 mg/dL 0.2 0.3 -  Alkaline Phos 39 - 117 IU/L 152(H) 129(H) -  AST 0 - 40 IU/L 10 12(L) -  ALT 0 - 32 IU/L 5 9 -    Renal function CrCl cannot be calculated (Patient's most recent lab result is older than the maximum 21 days allowed.).  HbA1c, POC (controlled diabetic range) (%)  Date Value  12/12/2018 7.8 (A)   Hgb A1c MFr Bld (%)  Date Value  05/09/2019 6.9 (H)    LDL Cholesterol  Date Value Ref Range Status  11/24/2016 13  0 - 99 mg/dL Final    Comment:           Total Cholesterol/HDL:CHD Risk Coronary Heart Disease Risk Table                     Men   Women  1/2 Average Risk   3.4   3.3  Average Risk       5.0   4.4  2 X Average Risk   9.6   7.1  3 X Average Risk  23.4   11.0        Use the calculated Patient Ratio above and the CHD Risk Table to determine the patient's CHD Risk.        ATP III CLASSIFICATION (LDL):  <100     mg/dL   Optimal  100-129  mg/dL   Near or Above                    Optimal  130-159  mg/dL   Borderline  160-189  mg/dL   High  >190     mg/dL   Very High     Upper extremity venous duplex reviewed No usable vein for arteriovenous  fistula  Yevonne Aline. Stanford Breed, MD [facility] Phone Number: [phone] 01/27/2021 1:20 PM  Total time spent on preparing this encounter including chart review, data review, collecting history, examining the patient, coordinating care for this established patient, 40 minutes.  Portions of this report may have been transcribed using voice recognition software.  Every effort has been made to ensure accuracy; however, inadvertent computerized transcription errors may still be present.

## 2021-01-28 ENCOUNTER — Encounter: Payer: Self-pay | Admitting: Vascular Surgery

## 2021-01-28 ENCOUNTER — Ambulatory Visit (HOSPITAL_COMMUNITY)
Admission: RE | Admit: 2021-01-28 | Discharge: 2021-01-28 | Disposition: A | Discharge status: Home / Self Care | Payer: Medicare HMO | Source: Ambulatory Visit | Attending: Vascular Surgery | Admitting: Vascular Surgery

## 2021-01-28 ENCOUNTER — Other Ambulatory Visit: Payer: Self-pay

## 2021-01-28 ENCOUNTER — Ambulatory Visit: Payer: Medicare HMO | Admitting: Vascular Surgery

## 2021-01-28 ENCOUNTER — Ambulatory Visit (INDEPENDENT_AMBULATORY_CARE_PROVIDER_SITE_OTHER)
Admission: RE | Admit: 2021-01-28 | Discharge: 2021-01-28 | Disposition: A | Discharge status: Home / Self Care | Payer: Medicare HMO | Source: Ambulatory Visit | Attending: Vascular Surgery | Admitting: Vascular Surgery

## 2021-01-28 VITALS — BP 159/72 | HR 61 | Temp 97.8°F | Resp 20 | Ht 66.0 in | Wt 135.0 lb

## 2021-01-28 DIAGNOSIS — N186 End stage renal disease: Secondary | ICD-10-CM | POA: Insufficient documentation

## 2021-01-28 DIAGNOSIS — N184 Chronic kidney disease, stage 4 (severe): Secondary | ICD-10-CM | POA: Diagnosis not present

## 2021-01-31 ENCOUNTER — Encounter (HOSPITAL_COMMUNITY): Payer: Self-pay | Admitting: Vascular Surgery

## 2021-01-31 ENCOUNTER — Other Ambulatory Visit: Payer: Self-pay

## 2021-01-31 NOTE — Progress Notes (Addendum)
Sara Davis denies chest pain or shortness of breath. Patient denies having any s/s of Covid in her household.  Patient denies any known exposure to Covid.   Sara Davis has type II diabetes. Patient is no longer on medications for diabetes. Sara Davis checks CBGs sometimes, it runs 120- 130.Last A1C , Sara Davis, patient's son reports was 7.0 I instructed Sara Davis  to check CBG after awaking and every 2 hours until arrival  to the hospital.  I Instructed patient if CBG is less than 70 to take 4 Glucose Tablets or 1 tube of Glucose Gel or 1/2 cup of a clear juice. Recheck CBG in 15 minutes if CBG is not over 70 call, pre- op desk at [phone] for further instructions.  PCP is at [facility].  Nephrologist is Dr. Ballard Russell.  I called [facility], I spoke to [employer], I asked for last office note and labs.  I instructed  Sara. Davis to shower with antibiotic soap, if it is available.  Dry off with a clean towel. Do not put lotion, powder, cologne or deodorant or makeup.No jewelry or piercings. Men may shave their face and neck. Woman should not shave. No nail polish, artificial or acrylic nails. Wear clean clothes, brush your teeth. Glasses, contact lens,dentures or partials may not be worn in the OR. If you need to wear them, please bring a case for glasses, do not wear contacts or bring a case, the hospital does not have contact cases, dentures or partials will have to be removed , make sure they are clean, we will provide a denture cup to put them in. You will need some one to drive you home and a responsible person over the age of 44 to stay with you for the first 24 hours after surgery.   I obtained office notes and labs from [facility], last office visit was 01/20/21- hemoglobin was 7.6 at that visit; plan is to begin ESA- Erythropoiesis Stimulating Agent injections at the hospital soon.  Sara Davis has not been informed of an appointment at this time.   Forwarding  information to anesthesiology PA-C.

## 2021-02-05 ENCOUNTER — Encounter (HOSPITAL_COMMUNITY)
Admission: RE | Disposition: A | Discharge status: Home / Self Care | Payer: Self-pay | Source: Home / Self Care | Attending: Vascular Surgery

## 2021-02-05 ENCOUNTER — Ambulatory Visit (HOSPITAL_COMMUNITY): Payer: Medicare HMO | Admitting: Physician Assistant

## 2021-02-05 ENCOUNTER — Ambulatory Visit (HOSPITAL_COMMUNITY)
Admission: RE | Admit: 2021-02-05 | Discharge: 2021-02-05 | Disposition: A | Discharge status: Home / Self Care | Payer: Medicare HMO | Attending: Vascular Surgery | Admitting: Vascular Surgery

## 2021-02-05 ENCOUNTER — Encounter (HOSPITAL_COMMUNITY): Payer: Self-pay | Admitting: Vascular Surgery

## 2021-02-05 ENCOUNTER — Other Ambulatory Visit: Payer: Self-pay

## 2021-02-05 DIAGNOSIS — N185 Chronic kidney disease, stage 5: Secondary | ICD-10-CM | POA: Diagnosis not present

## 2021-02-05 DIAGNOSIS — N184 Chronic kidney disease, stage 4 (severe): Secondary | ICD-10-CM | POA: Diagnosis not present

## 2021-02-05 DIAGNOSIS — I120 Hypertensive chronic kidney disease with stage 5 chronic kidney disease or end stage renal disease: Secondary | ICD-10-CM | POA: Diagnosis present

## 2021-02-05 DIAGNOSIS — E1122 Type 2 diabetes mellitus with diabetic chronic kidney disease: Secondary | ICD-10-CM | POA: Insufficient documentation

## 2021-02-05 HISTORY — PX: AV FISTULA PLACEMENT: SHX1204

## 2021-02-05 HISTORY — DX: Anemia, unspecified: D64.9

## 2021-02-05 HISTORY — DX: Personal history of other medical treatment: Z92.89

## 2021-02-05 LAB — GLUCOSE, CAPILLARY
Glucose-Capillary: 176 mg/dL — ABNORMAL HIGH (ref 70–99)
Glucose-Capillary: 201 mg/dL — ABNORMAL HIGH (ref 70–99)
Glucose-Capillary: 164 mg/dL — ABNORMAL HIGH (ref 70–99)

## 2021-02-05 LAB — POCT I-STAT, CHEM 8
BUN: 46 mg/dL — ABNORMAL HIGH (ref 8–23)
Calcium, Ion: 1.43 mmol/L — ABNORMAL HIGH (ref 1.15–1.40)
Chloride: 118 mmol/L — ABNORMAL HIGH (ref 98–111)
Creatinine, Ser: 5.7 mg/dL — ABNORMAL HIGH (ref 0.44–1.00)
Glucose, Bld: 164 mg/dL — ABNORMAL HIGH (ref 70–99)
HCT: 24 % — ABNORMAL LOW (ref 36.0–46.0)
Hemoglobin: 8.2 g/dL — ABNORMAL LOW (ref 12.0–15.0)
Potassium: 5 mmol/L (ref 3.5–5.1)
Sodium: 140 mmol/L (ref 135–145)
TCO2: 15 mmol/L — ABNORMAL LOW (ref 22–32)

## 2021-02-05 SURGERY — INSERTION OF ARTERIOVENOUS (AV) GORE-TEX GRAFT ARM
Anesthesia: Monitor Anesthesia Care | Site: Arm Upper | Laterality: Right

## 2021-02-05 MED ORDER — SODIUM CHLORIDE 0.9 % IV SOLN: INTRAVENOUS | Status: DC

## 2021-02-05 MED ORDER — DEXAMETHASONE SODIUM PHOSPHATE 10 MG/ML IJ SOLN
INTRAMUSCULAR | Status: AC
  Filled 2021-02-05: qty 1

## 2021-02-05 MED ORDER — HEPARIN SODIUM (PORCINE) 1000 UNIT/ML IJ SOLN
INTRAMUSCULAR | Status: DC | PRN
  Administered 2021-02-05: 3000 [IU] via INTRAVENOUS

## 2021-02-05 MED ORDER — HYDRALAZINE HCL 20 MG/ML IJ SOLN
INTRAMUSCULAR | Status: AC
  Filled 2021-02-05: qty 1

## 2021-02-05 MED ORDER — CHLORHEXIDINE GLUCONATE 0.12 % MT SOLN: 15.0000 mL | Freq: Once | OROMUCOSAL | Status: AC

## 2021-02-05 MED ORDER — ORAL CARE MOUTH RINSE: 15.0000 mL | Freq: Once | OROMUCOSAL | Status: AC

## 2021-02-05 MED ORDER — HEPARIN 6000 UNIT IRRIGATION SOLUTION
Status: AC
  Filled 2021-02-05: qty 500

## 2021-02-05 MED ORDER — FENTANYL CITRATE (PF) 100 MCG/2ML IJ SOLN: 25.0000 ug | INTRAMUSCULAR | Status: DC | PRN

## 2021-02-05 MED ORDER — ACETAMINOPHEN 10 MG/ML IV SOLN: 1000.0000 mg | Freq: Once | INTRAVENOUS | Status: DC | PRN

## 2021-02-05 MED ORDER — GLYCOPYRROLATE PF 0.2 MG/ML IJ SOSY
PREFILLED_SYRINGE | INTRAMUSCULAR | Status: DC | PRN
  Administered 2021-02-05: .2 mg via INTRAVENOUS

## 2021-02-05 MED ORDER — LIDOCAINE-EPINEPHRINE 1 %-1:100000 IJ SOLN
INTRAMUSCULAR | Status: DC | PRN
  Administered 2021-02-05: 20 mL

## 2021-02-05 MED ORDER — OXYCODONE-ACETAMINOPHEN 5-325 MG PO TABS
1.0000 | ORAL_TABLET | Freq: Four times a day (QID) | ORAL | 0 refills | Status: DC | PRN
Start: 2021-02-05 — End: 2022-06-17

## 2021-02-05 MED ORDER — HEPARIN SODIUM (PORCINE) 1000 UNIT/ML IJ SOLN
INTRAMUSCULAR | Status: AC
  Filled 2021-02-05: qty 10

## 2021-02-05 MED ORDER — PROTAMINE SULFATE 10 MG/ML IV SOLN
INTRAVENOUS | Status: AC
  Filled 2021-02-05: qty 5

## 2021-02-05 MED ORDER — HYDRALAZINE HCL 20 MG/ML IJ SOLN
INTRAMUSCULAR | Status: DC | PRN
  Administered 2021-02-05 (×2): 10 mg via INTRAVENOUS

## 2021-02-05 MED ORDER — FENTANYL CITRATE (PF) 250 MCG/5ML IJ SOLN
INTRAMUSCULAR | Status: AC
  Filled 2021-02-05: qty 5

## 2021-02-05 MED ORDER — ONDANSETRON HCL 4 MG/2ML IJ SOLN
INTRAMUSCULAR | Status: DC | PRN
  Administered 2021-02-05: 4 mg via INTRAVENOUS

## 2021-02-05 MED ORDER — CEFAZOLIN SODIUM-DEXTROSE 2-4 GM/100ML-% IV SOLN
INTRAVENOUS | Status: AC
  Filled 2021-02-05: qty 100

## 2021-02-05 MED ORDER — LABETALOL HCL 5 MG/ML IV SOLN
INTRAVENOUS | Status: DC | PRN
  Administered 2021-02-05 (×2): 10 mg via INTRAVENOUS

## 2021-02-05 MED ORDER — OXYCODONE HCL 5 MG/5ML PO SOLN: 5.0000 mg | Freq: Once | ORAL | Status: DC | PRN

## 2021-02-05 MED ORDER — ONDANSETRON HCL 4 MG/2ML IJ SOLN
INTRAMUSCULAR | Status: AC
  Filled 2021-02-05: qty 2

## 2021-02-05 MED ORDER — AMISULPRIDE (ANTIEMETIC) 5 MG/2ML IV SOLN
INTRAVENOUS | Status: AC
  Filled 2021-02-05: qty 4

## 2021-02-05 MED ORDER — DEXAMETHASONE SODIUM PHOSPHATE 10 MG/ML IJ SOLN
INTRAMUSCULAR | Status: DC | PRN
  Administered 2021-02-05: 4 mg via INTRAVENOUS

## 2021-02-05 MED ORDER — PROPOFOL 500 MG/50ML IV EMUL
INTRAVENOUS | Status: DC | PRN
  Administered 2021-02-05: 80 ug/kg/min via INTRAVENOUS

## 2021-02-05 MED ORDER — GLYCOPYRROLATE PF 0.2 MG/ML IJ SOSY
PREFILLED_SYRINGE | INTRAMUSCULAR | Status: AC
  Filled 2021-02-05: qty 1

## 2021-02-05 MED ORDER — MEPIVACAINE HCL (PF) 1 % IJ SOLN
INTRAMUSCULAR | Status: DC | PRN
  Administered 2021-02-05: 10 mL via PERINEURAL

## 2021-02-05 MED ORDER — CHLORHEXIDINE GLUCONATE 4 % EX LIQD: 60.0000 mL | Freq: Once | CUTANEOUS | Status: DC

## 2021-02-05 MED ORDER — HEPARIN 6000 UNIT IRRIGATION SOLUTION
Status: DC | PRN
Start: 2021-02-05 — End: 2021-02-05
  Administered 2021-02-05: 1

## 2021-02-05 MED ORDER — CEFAZOLIN SODIUM-DEXTROSE 2-4 GM/100ML-% IV SOLN
2.0000 g | INTRAVENOUS | Status: AC
  Administered 2021-02-05: 2 g via INTRAVENOUS

## 2021-02-05 MED ORDER — ACETAMINOPHEN 160 MG/5ML PO SOLN: 1000.0000 mg | Freq: Once | ORAL | Status: DC | PRN

## 2021-02-05 MED ORDER — PROTAMINE SULFATE 10 MG/ML IV SOLN
INTRAVENOUS | Status: DC | PRN
  Administered 2021-02-05: 25 mg via INTRAVENOUS

## 2021-02-05 MED ORDER — LABETALOL HCL 5 MG/ML IV SOLN
INTRAVENOUS | Status: AC
  Filled 2021-02-05: qty 4

## 2021-02-05 MED ORDER — SCOPOLAMINE 1 MG/3DAYS TD PT72
MEDICATED_PATCH | TRANSDERMAL | Status: DC | PRN
  Administered 2021-02-05: 1 via TRANSDERMAL

## 2021-02-05 MED ORDER — AMISULPRIDE (ANTIEMETIC) 5 MG/2ML IV SOLN
INTRAVENOUS | Status: DC | PRN
  Administered 2021-02-05: 10 mg via INTRAVENOUS

## 2021-02-05 MED ORDER — PROPOFOL 10 MG/ML IV BOLUS
INTRAVENOUS | Status: AC
  Filled 2021-02-05: qty 20

## 2021-02-05 MED ORDER — 0.9 % SODIUM CHLORIDE (POUR BTL) OPTIME
TOPICAL | Status: DC | PRN
Start: 2021-02-05 — End: 2021-02-05
  Administered 2021-02-05: 1000 mL

## 2021-02-05 MED ORDER — SCOPOLAMINE 1 MG/3DAYS TD PT72
MEDICATED_PATCH | TRANSDERMAL | Status: AC
  Filled 2021-02-05: qty 1

## 2021-02-05 MED ORDER — FENTANYL CITRATE (PF) 250 MCG/5ML IJ SOLN
INTRAMUSCULAR | Status: DC | PRN
  Administered 2021-02-05: 150 ug via INTRAVENOUS

## 2021-02-05 MED ORDER — OXYCODONE HCL 5 MG PO TABS: 5.0000 mg | ORAL_TABLET | Freq: Once | ORAL | Status: DC | PRN

## 2021-02-05 MED ORDER — ACETAMINOPHEN 500 MG PO TABS: 1000.0000 mg | ORAL_TABLET | Freq: Once | ORAL | Status: DC | PRN

## 2021-02-05 MED ORDER — PHENYLEPHRINE HCL-NACL 20-0.9 MG/250ML-% IV SOLN
INTRAVENOUS | Status: DC | PRN
  Administered 2021-02-05: 40 ug/min via INTRAVENOUS

## 2021-02-05 MED ORDER — CHLORHEXIDINE GLUCONATE 0.12 % MT SOLN
OROMUCOSAL | Status: AC
  Administered 2021-02-05: 15 mL via OROMUCOSAL
  Filled 2021-02-05: qty 15

## 2021-02-05 MED ORDER — SUCCINYLCHOLINE CHLORIDE 200 MG/10ML IV SOSY
PREFILLED_SYRINGE | INTRAVENOUS | Status: AC
  Filled 2021-02-05: qty 10

## 2021-02-05 MED ORDER — PROPOFOL 10 MG/ML IV BOLUS
INTRAVENOUS | Status: DC | PRN
Start: 2021-02-05 — End: 2021-02-05
  Administered 2021-02-05: 100 mg via INTRAVENOUS
  Administered 2021-02-05 (×2): 30 mg via INTRAVENOUS

## 2021-02-05 SURGICAL SUPPLY — 44 items
ADH SKN CLS APL DERMABOND .7 (GAUZE/BANDAGES/DRESSINGS) ×1
APL PRP STRL LF DISP 70% ISPRP (MISCELLANEOUS) ×1
APL SKNCLS STERI-STRIP NONHPOA (GAUZE/BANDAGES/DRESSINGS) ×1
ARMBAND PINK RESTRICT EXTREMIT (MISCELLANEOUS) ×3 IMPLANT
BENZOIN TINCTURE PRP APPL 2/3 (GAUZE/BANDAGES/DRESSINGS) ×3 IMPLANT
CANISTER SUCT 3000ML PPV (MISCELLANEOUS) ×3 IMPLANT
CANNULA VESSEL 3MM 2 BLNT TIP (CANNULA) ×3 IMPLANT
CHLORAPREP W/TINT 26 (MISCELLANEOUS) ×3 IMPLANT
COVER PROBE W GEL 5X96 (DRAPES) ×1 IMPLANT
DERMABOND ADVANCED (GAUZE/BANDAGES/DRESSINGS) ×1
DERMABOND ADVANCED .7 DNX12 (GAUZE/BANDAGES/DRESSINGS) IMPLANT
ELECT REM PT RETURN 9FT ADLT (ELECTROSURGICAL) ×2
ELECTRODE REM PT RTRN 9FT ADLT (ELECTROSURGICAL) ×2 IMPLANT
GLOVE SURG ENC MOIS LTX SZ8 (GLOVE) ×3 IMPLANT
GLOVE SURG MICRO LTX SZ6.5 (GLOVE) ×1 IMPLANT
GOWN STRL REUS W/ TWL LRG LVL3 (GOWN DISPOSABLE) ×4 IMPLANT
GOWN STRL REUS W/ TWL XL LVL3 (GOWN DISPOSABLE) ×2 IMPLANT
GOWN STRL REUS W/TWL LRG LVL3 (GOWN DISPOSABLE) ×4
GOWN STRL REUS W/TWL XL LVL3 (GOWN DISPOSABLE) ×2
GRAFT GORETEX STRT 4-7X45 (Vascular Products) ×1 IMPLANT
HEMOSTAT SNOW SURGICEL 2X4 (HEMOSTASIS) IMPLANT
INSERT FOGARTY SM (MISCELLANEOUS) ×3 IMPLANT
KIT BASIN OR (CUSTOM PROCEDURE TRAY) ×3 IMPLANT
KIT TURNOVER KIT B (KITS) ×3 IMPLANT
LOOP VESSEL MINI RED (MISCELLANEOUS) ×1 IMPLANT
NDL 18GX1X1/2 (RX/OR ONLY) (NEEDLE) IMPLANT
NEEDLE 18GX1X1/2 (RX/OR ONLY) (NEEDLE) IMPLANT
NS IRRIG 1000ML POUR BTL (IV SOLUTION) ×3 IMPLANT
PACK CV ACCESS (CUSTOM PROCEDURE TRAY) ×3 IMPLANT
PAD ARMBOARD 7.5X6 YLW CONV (MISCELLANEOUS) ×6 IMPLANT
SLING ARM FOAM STRAP LRG (SOFTGOODS) IMPLANT
SLING ARM FOAM STRAP MED (SOFTGOODS) ×1 IMPLANT
STRIP CLOSURE SKIN 1/2X4 (GAUZE/BANDAGES/DRESSINGS) ×3 IMPLANT
SUT GORETEX 6.0 TT9 (SUTURE) ×2 IMPLANT
SUT MNCRL AB 4-0 PS2 18 (SUTURE) IMPLANT
SUT PROLENE 6 0 BV (SUTURE) ×2 IMPLANT
SUT SILK 2 0 SH (SUTURE) IMPLANT
SUT VIC AB 3-0 SH 27 (SUTURE) ×4
SUT VIC AB 3-0 SH 27X BRD (SUTURE) ×4 IMPLANT
SYR 3ML LL SCALE MARK (SYRINGE) IMPLANT
SYR TOOMEY 50ML (SYRINGE) IMPLANT
TOWEL GREEN STERILE (TOWEL DISPOSABLE) ×3 IMPLANT
UNDERPAD 30X36 HEAVY ABSORB (UNDERPADS AND DIAPERS) ×3 IMPLANT
WATER STERILE IRR 1000ML POUR (IV SOLUTION) ×3 IMPLANT

## 2021-02-05 NOTE — Progress Notes (Signed)
Derrick, CRNA and Oleta Mouse present in room while labetalol 20mg  and hydralazine 10mg  given. BP 232/71.

## 2021-02-05 NOTE — Anesthesia Procedure Notes (Signed)
Procedure Name: Intubation Date/Time: 02/05/2021 2:09 PM Performed by: Cathren Harsh, CRNA Pre-anesthesia Checklist: Patient identified, Emergency Drugs available, Suction available and Patient being monitored Patient Re-evaluated:Patient Re-evaluated prior to induction Oxygen Delivery Method: Circle System Utilized Preoxygenation: Pre-oxygenation with 100% oxygen Induction Type: IV induction, Rapid sequence and Cricoid Pressure applied Laryngoscope Size: Mac and 3 Grade View: Grade I Tube type: Oral Number of attempts: 1 Airway Equipment and Method: Stylet and Oral airway Placement Confirmation: ETT inserted through vocal cords under direct vision, positive ETCO2 and breath sounds checked- equal and bilateral Secured at: 21 cm Tube secured with: Tape Dental Injury: Teeth and Oropharynx as per pre-operative assessment

## 2021-02-05 NOTE — Anesthesia Procedure Notes (Signed)
Anesthesia Regional Block: Supraclavicular block   Pre-Anesthetic Checklist: , timeout performed,  Correct Patient, Correct Site, Correct Laterality,  Correct Procedure, Correct Position, site marked,  Risks and benefits discussed,  Surgical consent,  Pre-op evaluation,  At surgeon's request and post-op pain management  Laterality: Right  Prep: chloraprep       Needles:  Injection technique: Single-shot      Needle Length: 5cm  Needle Gauge: 22     Additional Needles: Arrow StimuQuik ECHO Echogenic Stimulating PNB Needle  Procedures:,,,, ultrasound used (permanent image in chart),,    Narrative:  Start time: 02/05/2021 11:41 AM End time: 02/05/2021 11:51 AM Injection made incrementally with aspirations every 5 mL.  Performed by: Personally  Anesthesiologist: Oleta Mouse, MD

## 2021-02-05 NOTE — Transfer of Care (Signed)
Immediate Anesthesia Transfer of Care Note  Patient: Sara Davis  Procedure(s) Performed: INSERTION OF RIGHT ARM ARTERIOVENOUS (AV) GORE-TEX GRAFT (Right: Arm Upper)  Patient Location: PACU  Anesthesia Type:General and Regional  Level of Consciousness: sedated, drowsy and patient cooperative  Airway & Oxygen Therapy: Patient Spontanous Breathing and Patient connected to nasal cannula oxygen  Post-op Assessment: Report given to RN and Post -op Vital signs reviewed and stable  Post vital signs: Reviewed and stable  Last Vitals:  Vitals Value Taken Time  BP 109/49 02/05/21 1548  Temp    Pulse 72 02/05/21 1551  Resp 17 02/05/21 1551  SpO2 100 % 02/05/21 1551  Vitals shown include unvalidated device data.  Last Pain:  Vitals:   02/05/21 1018  TempSrc:   PainSc: 0-No pain         Complications: No notable events documented.

## 2021-02-05 NOTE — Anesthesia Preprocedure Evaluation (Signed)
Anesthesia Evaluation  Patient identified by MRN, date of birth, ID band Patient awake    Reviewed: Allergy & Precautions, NPO status , Patient's Chart, lab work & pertinent test results, reviewed documented beta blocker date and time   History of Anesthesia Complications Negative for: history of anesthetic complications  Airway Mallampati: II  TM Distance: >3 FB Neck ROM: Full    Dental  (+) Edentulous Upper, Edentulous Lower, Dental Advisory Given   Pulmonary neg shortness of breath, COPD, neg recent URI, Current Smoker and Patient abstained from smoking.,    breath sounds clear to auscultation       Cardiovascular hypertension, Pt. on medications and Pt. on home beta blockers (-) angina+CHF  (-) dysrhythmias  Rhythm:Regular  1. Left ventricular ejection fraction, by visual estimation, is 55 to  60%. The left ventricle has normal function. There is moderately increased  left ventricular hypertrophy.  2. Left ventricular diastolic parameters are consistent with Grade I  diastolic dysfunction (impaired relaxation).  3. The left ventricle has no regional wall motion abnormalities.  4. Global right ventricle has normal systolic function.The right  ventricular size is normal. No increase in right ventricular wall  thickness.  5. Left atrial size was normal.  6. Right atrial size was normal.  7. Presence of pericardial fat pad.  8. Trivial pericardial effusion is present.  9. The mitral valve is grossly normal. Trivial mitral valve  regurgitation.  10. The tricuspid valve is grossly normal.  11. The aortic valve is tricuspid. Aortic valve regurgitation is not  visualized. No evidence of aortic valve sclerosis or stenosis.  12. Pulmonic regurgitation is mild.  13. The pulmonic valve was grossly normal. Pulmonic valve regurgitation is  mild.  14. Normal pulmonary artery systolic pressure.  15. The tricuspid regurgitant  velocity is 2.36 m/s, and with an assumed  right atrial pressure of 3 mmHg, the estimated right ventricular systolic  pressure is normal at 25.2 mmHg.    Neuro/Psych  Headaches, PSYCHIATRIC DISORDERS Depression    GI/Hepatic Neg liver ROS, PUD,   Endo/Other  diabetesLab Results      Component                Value               Date                      HGBA1C                   6.9 (H)             05/09/2019             Renal/GU CRFRenal diseaseLab Results      Component                Value               Date                      CREATININE               5.70 (H)            02/05/2021           Lab Results      Component                Value  Date                      K                        5.0                 02/05/2021                Musculoskeletal   Abdominal   Peds  Hematology  (+) Blood dyscrasia, anemia , Lab Results      Component                Value               Date                      WBC                      7.0                 05/09/2019                HGB                      8.2 (L)             02/05/2021                HCT                      24.0 (L)            02/05/2021                MCV                      85                  05/09/2019                PLT                      347                 05/09/2019              Anesthesia Other Findings   Reproductive/Obstetrics                            Anesthesia Physical Anesthesia Plan  ASA: 3  Anesthesia Plan: MAC and Regional   Post-op Pain Management: Regional block   Induction:   PONV Risk Score and Plan: 1 and Propofol infusion and Treatment may vary due to age or medical condition  Airway Management Planned: Nasal Cannula  Additional Equipment: None  Intra-op Plan:   Post-operative Plan:   Informed Consent: I have reviewed the patients History and Physical, chart, labs and discussed the procedure including the risks, benefits and  alternatives for the proposed anesthesia with the patient or authorized representative who has indicated his/her understanding and acceptance.     Dental advisory given  Plan Discussed with: CRNA and Anesthesiologist  Anesthesia Plan Comments:         Anesthesia Quick Evaluation

## 2021-02-05 NOTE — Progress Notes (Signed)
Updated patient's son of delay.  Son verbalized understanding.

## 2021-02-05 NOTE — Op Note (Signed)
DATE OF SERVICE: 02/05/2021  PATIENT:  Sara Davis  71 y.o. female  PRE-OPERATIVE DIAGNOSIS:  CKD V  POST-OPERATIVE DIAGNOSIS:  Same  PROCEDURE:   Right upper extremity brachial - brachial arteriovenous graft placement \  SURGEON:  Surgeon(s) and Role:    * Cherre Robins, MD - Primary  ASSISTANT: Gerri Lins, PA-C  An assistant was required to facilitate exposure and expedite the case.  ANESTHESIA:   general  EBL: minimal  BLOOD ADMINISTERED:none  DRAINS: none   LOCAL MEDICATIONS USED:  NONE  SPECIMEN:  none  COUNTS: confirmed correct.  TOURNIQUET:  none  PATIENT DISPOSITION:  PACU - hemodynamically stable.   Delay start of Pharmacological VTE agent (>24hrs) due to surgical blood loss or risk of bleeding: no  INDICATION FOR PROCEDURE: Sara Davis is a 71 y.o. female with CKD V. After careful discussion of risks, benefits, and alternatives the patient was offered arteriovenous graft. The patient understood and wished to proceed.  OPERATIVE FINDINGS: healthy brachial artery.  I attempted to connect the outflow to the basilic vein.  This did not generate a good thrill.  I abandon this approach, and revise the distal anastomosis to the brachial vein.  This delivered a smooth thrill.  Good Doppler flow at the wrist to completion.  DESCRIPTION OF PROCEDURE: After identification of the patient in the pre-operative holding area, the patient was transferred to the operating room. The patient was positioned supine on the operating room table. Anesthesia was induced. The right arm was prepped and draped in standard fashion. A surgical pause was performed confirming correct patient, procedure, and operative location.  The right brachial artery was exposed using a longitudinal incision in the distal arm just above the antecubital fossa.  Incision was carried down through subcutaneous tissue until the brachial sheath was encountered.  This was incised sharply.  The brachial  artery was exposed and encircled with Silastic Vesseloops proximally and distally to the site of planned inflow.   The right basilic vein at the axilla was exposed using longitudinal incision just below the hairbearing area of the axilla.  Incision was carried down until the brachial sheath was encountered.  The basilic vein was identified, exposed, encircled with Silastic Vesseloops.   Using a curved, sheathed tunneling device, a 4-7 mm tapered Gore-Tex graft was tunneled subcutaneously and gentle arc across the biceps of the right arm.  Patient was then heparinized with 3000 units of IV heparin.   The brachial artery was clamped proximally and distally.  An anterior arteriotomy was made with an 11 blade.  This was extended with Potts scissors.  The 4 mm end of the Gore-Tex graft was spatulated and then anastomosed end-to-side to the brachial arteriotomy using continuous running suture of 6-0 Prolene.  The anastomosis was completed and hemostasis ensured.  The graft was clamped to restore perfusion to the hand.   The basilic vein was clamped proximally and distally.  An anterior venotomy was made with an 11 blade.  This was extended with Potts scissors.  The 7 mm end of the Gore-Tex graft was then anastomosed end to side to the brachial vein venotomy using continuous running suture of 6-0 Prolene.  Immediately prior to completion the anastomosis was de-aired and flushed.  Anastomosis was then completed.  Hemostasis was insured.   Doppler machine was brought onto the field to interrogate the graft.  I did not think the doppler flow in the outflow was sufficient. I clamped the graft and basilic vein.  The anastomosis was taken down. The vein was repaired with a 6-O prolene. The brachial vein was identified.    The brachial vein was clamped proximally and distally.  An anterior venotomy was made with an 11 blade.  This was extended with Potts scissors.  The 7 mm end of the Gore-Tex graft was then  anastomosed end to side to the brachial vein venotomy using continuous running suture of 6-0 Prolene.  Immediately prior to completion the anastomosis was de-aired and flushed.  Anastomosis was then completed.  Hemostasis was insured.  Doppler flow was noted in the radial artery.  About the arterial anastomosis flow was noted proximal and distal to the arterial anastomosis.  Distal to the venous anastomosis a strong Doppler bruit was heard.  Satisfied we ended the case here.   Surgical beds were irrigated copiously.  Hemostasis was again ensured in the surgical beds.  The wounds were closed in layers using 3-0 Vicryl and 4-0 Monocryl. Dermabond was applied.  Upon completion of the case instrument and sharps counts were confirmed correct. The patient was transferred to the PACU in good condition. I was present for all portions of the procedure.  Yevonne Aline. Stanford Breed, MD [facility] Phone Number: [phone] 02/05/2021 3:38 PM

## 2021-02-05 NOTE — Discharge Instructions (Signed)
° °  [facility] ° °Discharge Instructions ° °AV Fistula or Graft Surgery for Dialysis Access ° °Please refer to the following instructions for your post-procedure care. Your surgeon or physician assistant will discuss any changes with you. ° °Activity ° °You may drive the day following your surgery, if you are comfortable and no longer taking prescription pain medication. Resume full activity as the soreness in your incision resolves. ° °Bathing/Showering ° °You may shower after you go home. Keep your incision dry for 48 hours. Do not soak in a bathtub, hot tub, or swim until the incision heals completely. You may not shower if you have a hemodialysis catheter. ° °Incision Care ° °Clean your incision with mild soap and water after 48 hours. Pat the area dry with a clean towel. You do not need a bandage unless otherwise instructed. Do not apply any ointments or creams to your incision. You may have skin glue on your incision. Do not peel it off. It will come off on its own in about one week. Your arm may swell a bit after surgery. To reduce swelling use pillows to elevate your arm so it is above your heart. Your doctor will tell you if you need to lightly wrap your arm with an ACE bandage. ° °Diet ° °Resume your normal diet. There are not special food restrictions following this procedure. In order to heal from your surgery, it is CRITICAL to get adequate nutrition. Your body requires vitamins, minerals, and protein. Vegetables are the best source of vitamins and minerals. Vegetables also provide the perfect balance of protein. Processed food has little nutritional value, so try to avoid this. ° °Medications ° °Resume taking all of your medications. If your incision is causing pain, you may take over-the counter pain relievers such as acetaminophen (Tylenol). If you were prescribed a stronger pain medication, please be aware these medications can cause nausea and constipation. Prevent  nausea by taking the medication with a snack or meal. Avoid constipation by drinking plenty of fluids and eating foods with high amount of fiber, such as fruits, vegetables, and grains. Do not take Tylenol if you are taking prescription pain medications. ° ° ° ° °Follow up °Your surgeon may want to see you in the office following your access surgery. If so, this will be arranged at the time of your surgery. ° °Please call us immediately for any of the following conditions: ° °Increased pain, redness, drainage (pus) from your incision site °Fever of 101 degrees or higher °Severe or worsening pain at your incision site °Hand pain or numbness. ° °Reduce your risk of vascular disease: ° °Stop smoking. If you would like help, call [facility] at 1-800-QUIT-NOW (1-800-784-8669) or [place] at 336-586-4000 ° °Manage your cholesterol °Maintain a desired weight °Control your diabetes °Keep your blood pressure down ° °Dialysis ° °It will take several weeks to several months for your new dialysis access to be ready for use. Your surgeon will determine when it is OK to use it. Your nephrologist will continue to direct your dialysis. You can continue to use your Permcath until your new access is ready for use. ° °If you have any questions, please call the office at [phone]. ° °

## 2021-02-05 NOTE — Anesthesia Postprocedure Evaluation (Signed)
Anesthesia Post Note  Patient: Sara Davis  Procedure(s) Performed: INSERTION OF RIGHT ARM ARTERIOVENOUS (AV) GORE-TEX GRAFT (Right: Arm Upper)     Patient location during evaluation: PACU Anesthesia Type: Regional Level of consciousness: awake and alert Pain management: pain level controlled Vital Signs Assessment: post-procedure vital signs reviewed and stable Respiratory status: spontaneous breathing, nonlabored ventilation, respiratory function stable and patient connected to nasal cannula oxygen Cardiovascular status: blood pressure returned to baseline and stable Postop Assessment: no apparent nausea or vomiting Anesthetic complications: no   No notable events documented.  Last Vitals:  Vitals:   02/05/21 1604 02/05/21 1619  BP: (!) 111/54 (!) 120/50  Pulse: 73 76  Resp: 14 20  Temp:  36.9 C  SpO2: 98% 97%    Last Pain:  Vitals:   02/05/21 1619  TempSrc:   PainSc: 0-No pain                 Jaymee Tilson

## 2021-02-05 NOTE — Interval H&P Note (Signed)
History and Physical Interval Note:  02/05/2021 12:04 PM  Sara Davis  has presented today for surgery, with the diagnosis of CKD V.  The various methods of treatment have been discussed with the patient and family. After consideration of risks, benefits and other options for treatment, the patient has consented to  Procedure(s) with comments: INSERTION OF RIGHT ARM ARTERIOVENOUS (AV) GORE-TEX GRAFT (Right) - PERIPHERAL NERVE BLOCK as a surgical intervention.  The patient's history has been reviewed, patient examined, no change in status, stable for surgery.  I have reviewed the patient's chart and labs.  Questions were answered to the patient's satisfaction.     Cherre Robins

## 2021-02-06 ENCOUNTER — Encounter (HOSPITAL_COMMUNITY): Payer: Self-pay | Admitting: Vascular Surgery

## 2021-02-11 ENCOUNTER — Other Ambulatory Visit: Payer: Self-pay | Admitting: Registered Nurse

## 2021-02-11 DIAGNOSIS — K862 Cyst of pancreas: Secondary | ICD-10-CM

## 2021-02-11 DIAGNOSIS — K863 Pseudocyst of pancreas: Secondary | ICD-10-CM

## 2021-02-21 ENCOUNTER — Encounter (HOSPITAL_COMMUNITY): Payer: Self-pay

## 2021-02-21 ENCOUNTER — Inpatient Hospital Stay (HOSPITAL_COMMUNITY)
Admission: RE | Admit: 2021-02-21 | Discharge: 2021-02-21 | Disposition: A | Discharge status: Home / Self Care | Payer: Medicare HMO | Source: Ambulatory Visit | Attending: Nephrology | Admitting: Nephrology

## 2021-03-05 ENCOUNTER — Ambulatory Visit (INDEPENDENT_AMBULATORY_CARE_PROVIDER_SITE_OTHER): Payer: Medicare HMO | Admitting: Physician Assistant

## 2021-03-05 ENCOUNTER — Other Ambulatory Visit: Payer: Self-pay

## 2021-03-05 VITALS — BP 149/66 | HR 56 | Temp 97.5°F | Resp 20 | Ht 66.0 in | Wt 136.0 lb

## 2021-03-05 DIAGNOSIS — N184 Chronic kidney disease, stage 4 (severe): Secondary | ICD-10-CM

## 2021-03-05 NOTE — Progress Notes (Signed)
° ° °  Postoperative Access Visit   History of Present Illness   Sara Davis is a 71 y.o. year old female who presents for postoperative follow-up for: right upper arm arteriovenous graft (Date: 02/05/21).  The patient's wounds are healed.  The patient has some R hand weakness but no severe steal symptoms.  The patient is  able to complete their activities of daily living.  She is not yet on HD.   Physical Examination   Vitals:   03/05/21 0942  BP: (!) 149/66  Pulse: (!) 56  Resp: 20  Temp: (!) 97.5 F (36.4 C)  TempSrc: Temporal  SpO2: 100%  Weight: 136 lb (61.7 kg)  Height: 5\' 6"  (1.676 m)   Body mass index is 21.95 kg/m.  right arm Incisions are healed, palpable radial pulse, hand grip is 5/5, sensation in digits is intact, palpable thrill, bruit can be auscultated     Medical Decision Making   Sara Davis is a 72 y.o. year old female who presents s/p right upper arm arteriovenous graft  Patent R arm AVG without signs or symptoms of steal syndrome The patient's access is ready for use The patient may follow up on a prn basis   Dagoberto Ligas PA-C [facility] Office: [phone]  Clinic MD: Donzetta Matters

## 2021-03-07 ENCOUNTER — Encounter (HOSPITAL_COMMUNITY): Payer: Medicare HMO

## 2021-03-20 ENCOUNTER — Encounter (HOSPITAL_COMMUNITY)
Admission: RE | Admit: 2021-03-20 | Discharge: 2021-03-20 | Disposition: A | Discharge status: Home / Self Care | Payer: Medicare HMO | Source: Ambulatory Visit | Attending: Nephrology | Admitting: Nephrology

## 2021-03-20 VITALS — BP 135/72 | HR 59 | Resp 20

## 2021-03-20 DIAGNOSIS — N184 Chronic kidney disease, stage 4 (severe): Secondary | ICD-10-CM | POA: Diagnosis not present

## 2021-03-20 LAB — POCT HEMOGLOBIN-HEMACUE: Hemoglobin: 7 g/dL — ABNORMAL LOW (ref 12.0–15.0)

## 2021-03-20 MED ORDER — EPOETIN ALFA-EPBX 10000 UNIT/ML IJ SOLN
20000.0000 [IU] | Freq: Once | INTRAMUSCULAR | Status: AC
  Administered 2021-03-20: 20000 [IU] via SUBCUTANEOUS

## 2021-03-20 MED ORDER — EPOETIN ALFA-EPBX 10000 UNIT/ML IJ SOLN
INTRAMUSCULAR | Status: AC
  Filled 2021-03-20: qty 2

## 2021-03-20 NOTE — Progress Notes (Signed)
Pt here for first retacrit injection.  HGB 7.0.  Ptc/o shortness of breath and tired, no bleeding, no chest pain.  Office notified and instructed to proceed with injection and ask pt to schedule follow up with office.  Pt made aware

## 2021-03-21 ENCOUNTER — Encounter (HOSPITAL_COMMUNITY): Payer: Medicare HMO

## 2021-04-03 ENCOUNTER — Encounter (HOSPITAL_COMMUNITY)
Admission: RE | Admit: 2021-04-03 | Discharge: 2021-04-03 | Disposition: A | Discharge status: Home / Self Care | Payer: Medicare HMO | Source: Ambulatory Visit | Attending: Nephrology | Admitting: Nephrology

## 2021-04-03 VITALS — BP 163/54 | HR 62 | Temp 97.2°F | Resp 20

## 2021-04-03 DIAGNOSIS — N184 Chronic kidney disease, stage 4 (severe): Secondary | ICD-10-CM | POA: Diagnosis present

## 2021-04-03 LAB — POCT HEMOGLOBIN-HEMACUE: Hemoglobin: 8 g/dL — ABNORMAL LOW (ref 12.0–15.0)

## 2021-04-03 MED ORDER — EPOETIN ALFA-EPBX 10000 UNIT/ML IJ SOLN
INTRAMUSCULAR | Status: AC
  Filled 2021-04-03: qty 2

## 2021-04-03 MED ORDER — EPOETIN ALFA-EPBX 10000 UNIT/ML IJ SOLN
20000.0000 [IU] | Freq: Once | INTRAMUSCULAR | Status: AC
  Administered 2021-04-03: 20000 [IU] via SUBCUTANEOUS

## 2021-04-17 ENCOUNTER — Encounter (HOSPITAL_COMMUNITY): Payer: Medicare HMO

## 2021-07-17 ENCOUNTER — Other Ambulatory Visit (HOSPITAL_COMMUNITY): Payer: Self-pay | Admitting: Registered Nurse

## 2021-07-17 DIAGNOSIS — K862 Cyst of pancreas: Secondary | ICD-10-CM

## 2021-07-28 ENCOUNTER — Ambulatory Visit (HOSPITAL_COMMUNITY): Payer: Medicare HMO

## 2021-08-06 IMAGING — MG DIGITAL SCREENING BILAT W/ TOMO W/ CAD
8 series · 8 of 24 positions shown · non-contrast
Comparison: Previous exam(s).

CLINICAL DATA: Screening.

EXAM:
DIGITAL SCREENING BILATERAL MAMMOGRAM WITH TOMO AND CAD

[R MLO synth-2D]
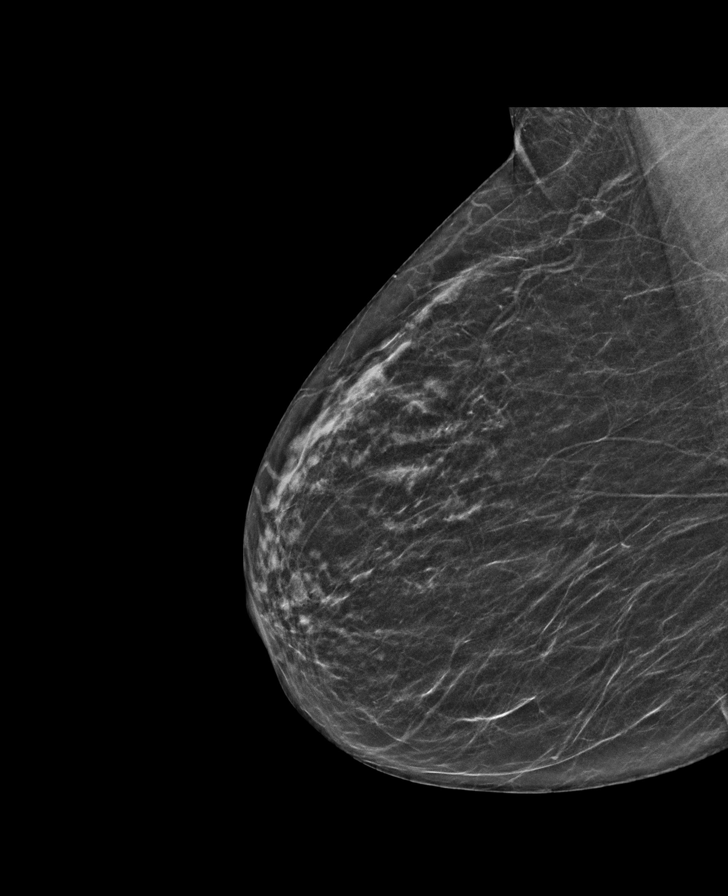

[R CC synth-2D]
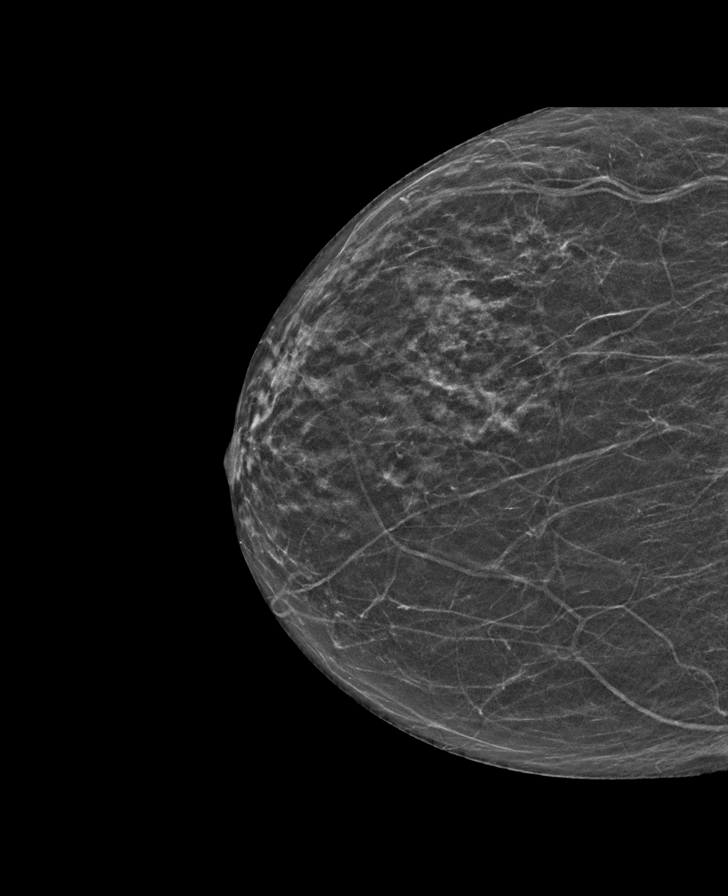

[L MLO synth-2D]
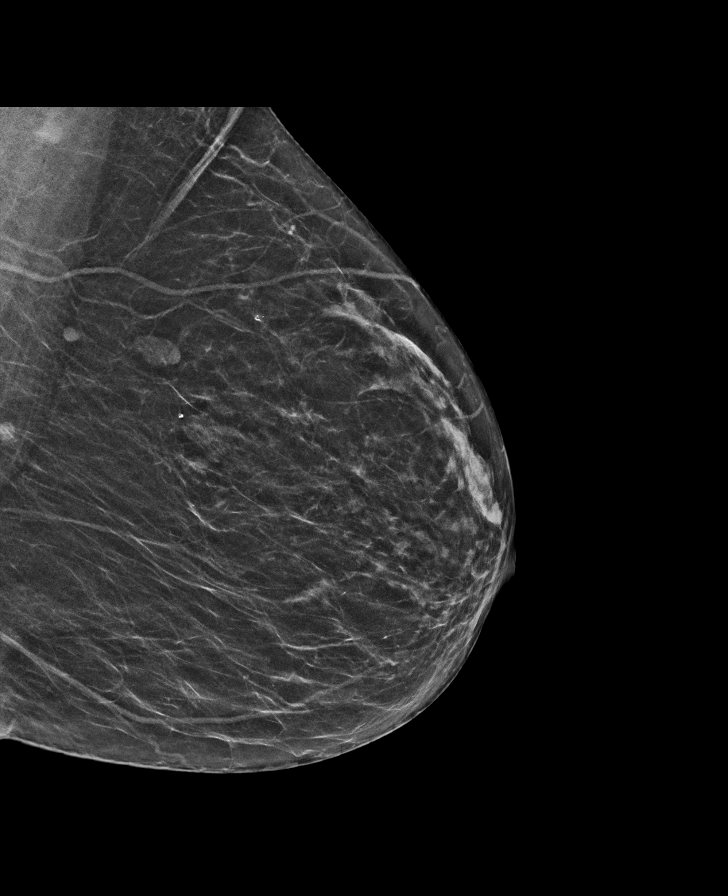

[L CC synth-2D]
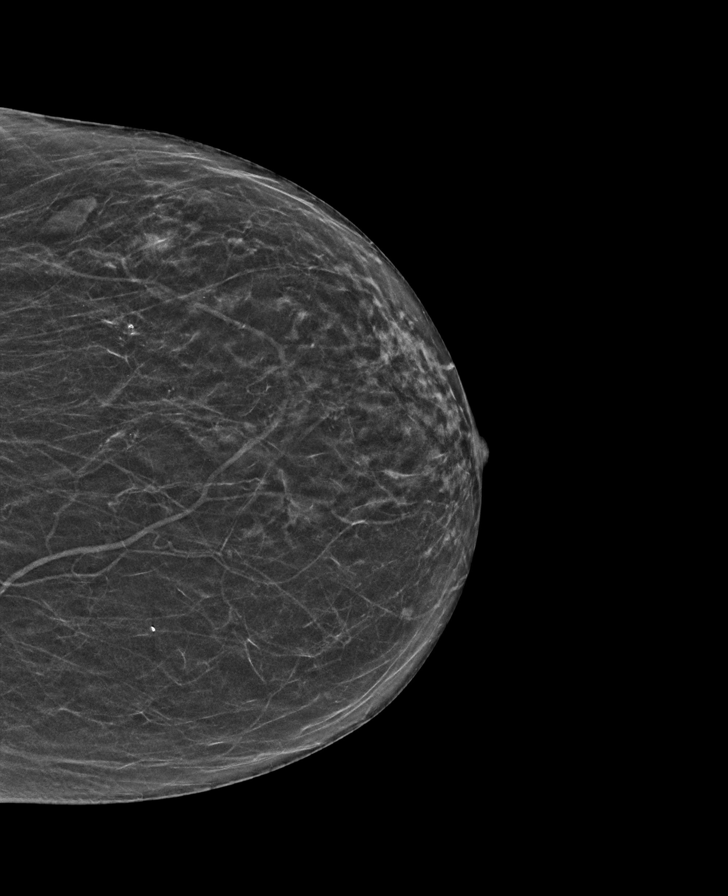

[R CC tomo · tomo slice 22/43.0]
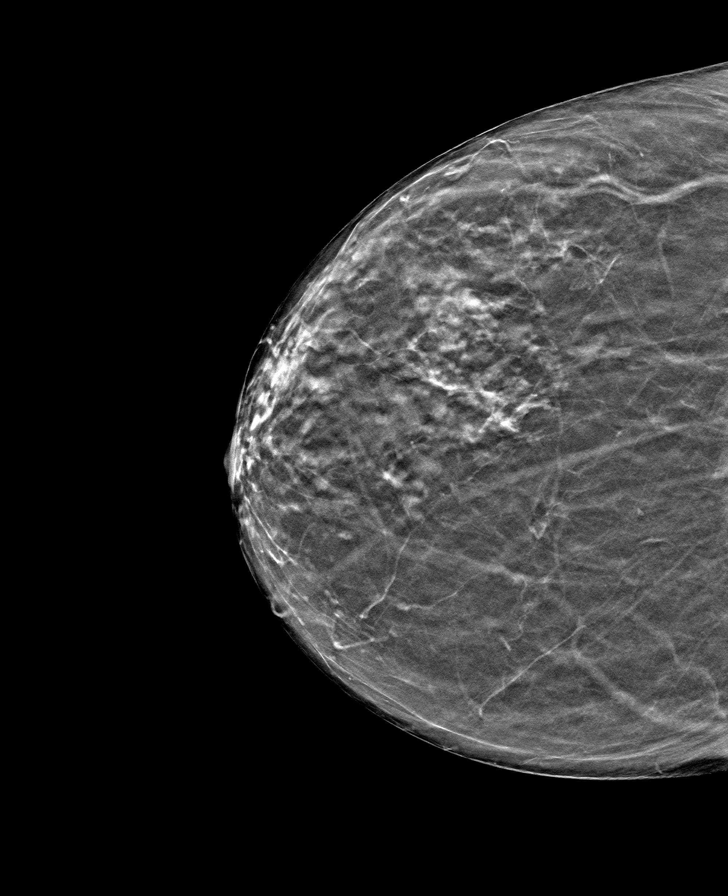

[L MLO tomo · tomo slice 27/53.0]
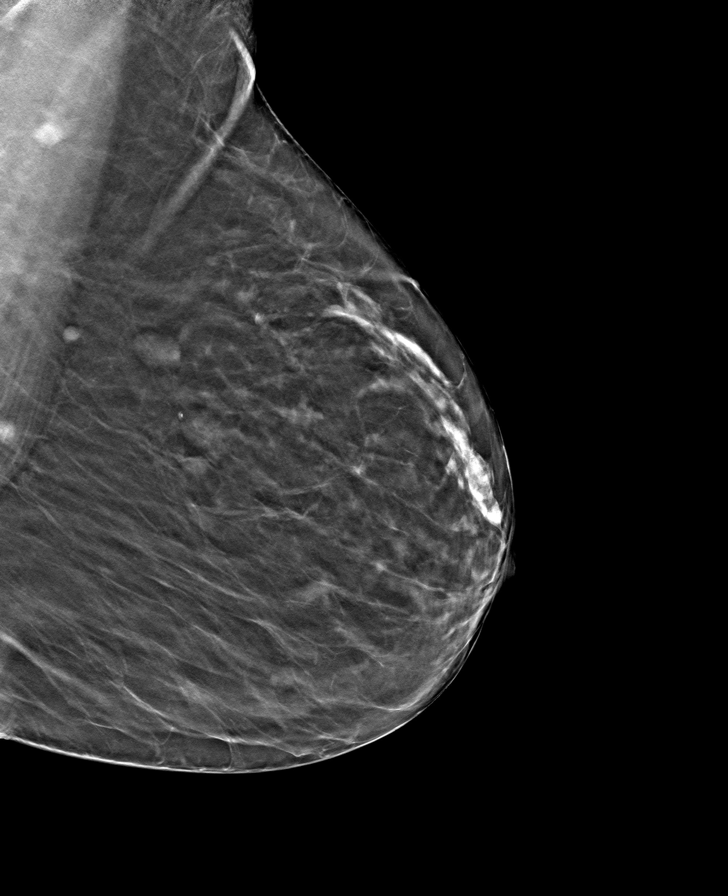

[R MLO tomo · tomo slice 25/50.0]
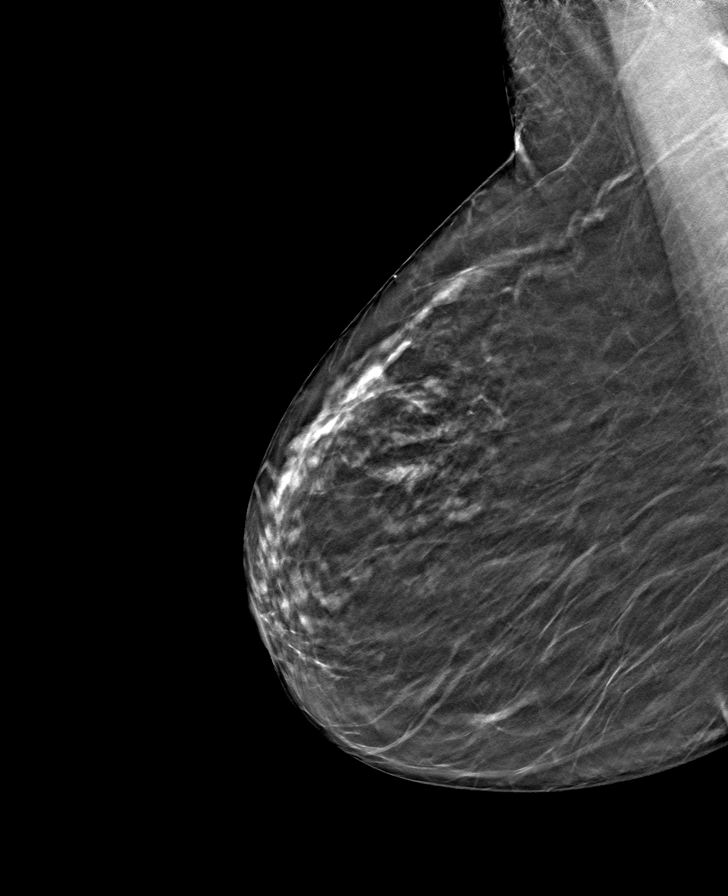

[L CC tomo · tomo slice 24/47.0]
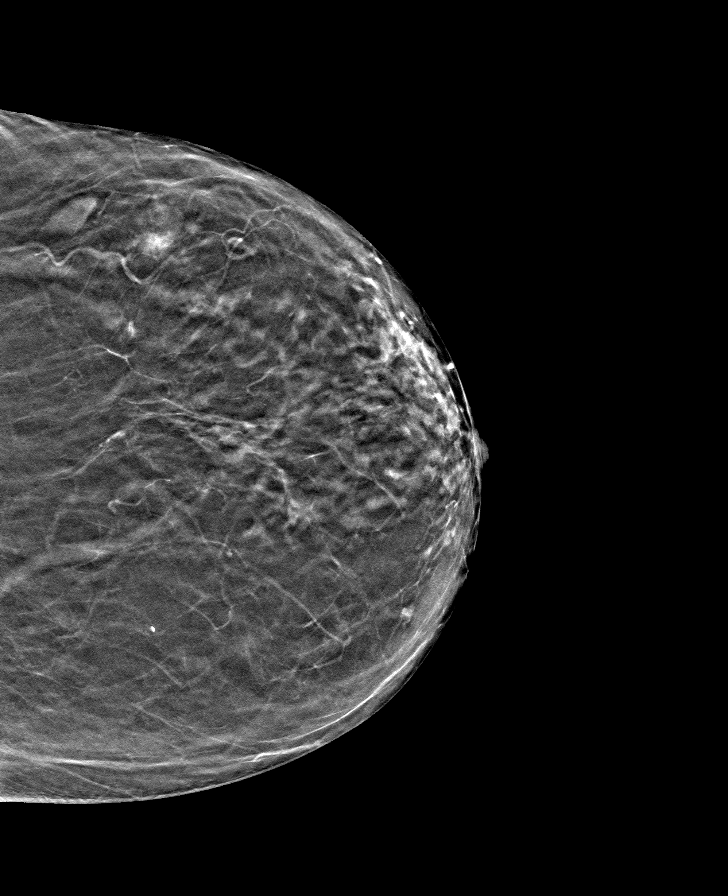

[8 of 24 positions shown; findings below may reference images not displayed]

ACR Breast Density Category b: There are scattered areas of
fibroglandular density.
FINDINGS: There are no findings suspicious for malignancy. Images were
processed with CAD.
IMPRESSION: No mammographic evidence of malignancy. A result letter of this
screening mammogram will be mailed directly to the patient.

RECOMMENDATION:
Screening mammogram in one year. (Code:[id number])

BI-RADS CATEGORY  1: Negative.

## 2021-08-08 ENCOUNTER — Ambulatory Visit (HOSPITAL_COMMUNITY)
Admission: RE | Admit: 2021-08-08 | Discharge: 2021-08-08 | Disposition: A | Discharge status: Home / Self Care | Payer: Medicare HMO | Source: Ambulatory Visit | Attending: Registered Nurse | Admitting: Registered Nurse

## 2021-08-08 DIAGNOSIS — K863 Pseudocyst of pancreas: Secondary | ICD-10-CM | POA: Insufficient documentation

## 2021-08-08 DIAGNOSIS — K862 Cyst of pancreas: Secondary | ICD-10-CM | POA: Diagnosis present

## 2022-06-15 ENCOUNTER — Encounter (HOSPITAL_COMMUNITY): Payer: Self-pay | Admitting: Emergency Medicine

## 2022-06-15 ENCOUNTER — Emergency Department (HOSPITAL_COMMUNITY): Payer: Medicare HMO

## 2022-06-15 ENCOUNTER — Other Ambulatory Visit: Payer: Self-pay

## 2022-06-15 ENCOUNTER — Inpatient Hospital Stay (HOSPITAL_COMMUNITY)
Admission: EM | Admit: 2022-06-15 | Discharge: 2022-06-17 | DRG: 377 | Disposition: A | Discharge status: Home / Self Care | Payer: Medicare HMO | Attending: Internal Medicine | Admitting: Internal Medicine

## 2022-06-15 DIAGNOSIS — N3000 Acute cystitis without hematuria: Secondary | ICD-10-CM

## 2022-06-15 DIAGNOSIS — N390 Urinary tract infection, site not specified: Secondary | ICD-10-CM

## 2022-06-15 DIAGNOSIS — K5521 Angiodysplasia of colon with hemorrhage: Secondary | ICD-10-CM | POA: Diagnosis not present

## 2022-06-15 DIAGNOSIS — D649 Anemia, unspecified: Secondary | ICD-10-CM

## 2022-06-15 DIAGNOSIS — Z809 Family history of malignant neoplasm, unspecified: Secondary | ICD-10-CM

## 2022-06-15 DIAGNOSIS — F17210 Nicotine dependence, cigarettes, uncomplicated: Secondary | ICD-10-CM | POA: Diagnosis present

## 2022-06-15 DIAGNOSIS — E785 Hyperlipidemia, unspecified: Secondary | ICD-10-CM | POA: Insufficient documentation

## 2022-06-15 DIAGNOSIS — I120 Hypertensive chronic kidney disease with stage 5 chronic kidney disease or end stage renal disease: Secondary | ICD-10-CM | POA: Diagnosis present

## 2022-06-15 DIAGNOSIS — Z8711 Personal history of peptic ulcer disease: Secondary | ICD-10-CM

## 2022-06-15 DIAGNOSIS — Z79899 Other long term (current) drug therapy: Secondary | ICD-10-CM

## 2022-06-15 DIAGNOSIS — R531 Weakness: Principal | ICD-10-CM

## 2022-06-15 DIAGNOSIS — K5520 Angiodysplasia of colon without hemorrhage: Secondary | ICD-10-CM

## 2022-06-15 DIAGNOSIS — Z8249 Family history of ischemic heart disease and other diseases of the circulatory system: Secondary | ICD-10-CM

## 2022-06-15 DIAGNOSIS — Z1152 Encounter for screening for COVID-19: Secondary | ICD-10-CM

## 2022-06-15 DIAGNOSIS — Z808 Family history of malignant neoplasm of other organs or systems: Secondary | ICD-10-CM

## 2022-06-15 DIAGNOSIS — Z8719 Personal history of other diseases of the digestive system: Secondary | ICD-10-CM

## 2022-06-15 DIAGNOSIS — Z992 Dependence on renal dialysis: Secondary | ICD-10-CM

## 2022-06-15 DIAGNOSIS — Z886 Allergy status to analgesic agent status: Secondary | ICD-10-CM

## 2022-06-15 DIAGNOSIS — M898X9 Other specified disorders of bone, unspecified site: Secondary | ICD-10-CM | POA: Diagnosis present

## 2022-06-15 DIAGNOSIS — I10 Essential (primary) hypertension: Secondary | ICD-10-CM | POA: Diagnosis present

## 2022-06-15 DIAGNOSIS — R748 Abnormal levels of other serum enzymes: Secondary | ICD-10-CM | POA: Diagnosis present

## 2022-06-15 DIAGNOSIS — Z8673 Personal history of transient ischemic attack (TIA), and cerebral infarction without residual deficits: Secondary | ICD-10-CM

## 2022-06-15 DIAGNOSIS — D631 Anemia in chronic kidney disease: Secondary | ICD-10-CM | POA: Diagnosis present

## 2022-06-15 DIAGNOSIS — Z794 Long term (current) use of insulin: Secondary | ICD-10-CM

## 2022-06-15 DIAGNOSIS — D62 Acute posthemorrhagic anemia: Secondary | ICD-10-CM | POA: Diagnosis not present

## 2022-06-15 DIAGNOSIS — K8020 Calculus of gallbladder without cholecystitis without obstruction: Secondary | ICD-10-CM | POA: Diagnosis present

## 2022-06-15 DIAGNOSIS — K2289 Other specified disease of esophagus: Secondary | ICD-10-CM | POA: Diagnosis present

## 2022-06-15 DIAGNOSIS — Z833 Family history of diabetes mellitus: Secondary | ICD-10-CM

## 2022-06-15 DIAGNOSIS — N186 End stage renal disease: Secondary | ICD-10-CM

## 2022-06-15 DIAGNOSIS — D500 Iron deficiency anemia secondary to blood loss (chronic): Secondary | ICD-10-CM

## 2022-06-15 DIAGNOSIS — E1122 Type 2 diabetes mellitus with diabetic chronic kidney disease: Secondary | ICD-10-CM

## 2022-06-15 LAB — LACTIC ACID, PLASMA: Lactic Acid, Venous: 1.2 mmol/L (ref 0.5–1.9)

## 2022-06-15 LAB — PROTIME-INR
INR: 1.1 (ref 0.8–1.2)
Prothrombin Time: 14.7 seconds (ref 11.4–15.2)

## 2022-06-15 LAB — TYPE AND SCREEN

## 2022-06-15 MED ORDER — VANCOMYCIN HCL 1250 MG/250ML IV SOLN
1250.0000 mg | Freq: Once | INTRAVENOUS | Status: AC
  Administered 2022-06-16: 1250 mg via INTRAVENOUS
  Filled 2022-06-15: qty 250

## 2022-06-15 MED ORDER — ACETAMINOPHEN 325 MG PO TABS
650.0000 mg | ORAL_TABLET | Freq: Once | ORAL | Status: AC
  Administered 2022-06-15: 650 mg via ORAL
  Filled 2022-06-15: qty 2

## 2022-06-15 MED ORDER — SODIUM CHLORIDE 0.9 % IV SOLN
1.0000 g | Freq: Once | INTRAVENOUS | Status: AC
  Administered 2022-06-16: 1 g via INTRAVENOUS
  Filled 2022-06-15: qty 10

## 2022-06-15 NOTE — ED Provider Notes (Signed)
[facility] Provider Note   CSN: [id number] Arrival date & time: 06/15/22  2204     History {Add pertinent medical, surgical, social history, OB history to HPI:1} Chief Complaint  Patient presents with   Weakness    Sara Davis is a 72 y.o. female.  Patient with a history of ESRD on dialysis, hypertension, diabetes presenting with 3-day history of body aches, chills, subjective fever, cough and pain all over with fatigue.  States she feels generally weak and was not able to go to dialysis on Saturday.  Last dialysis session was 5 days ago on Thursday.  Does make some urine.  States she feels sore and achy all over believes she has had a fever at home but did not check her temperature.  Nonproductive cough.  Nausea but no vomiting.  No chest pain or shortness of breath.  No abdominal pain.  No pain with urination or blood in the urine.  Does make some urine.  No recent black or bloody stools but does have a history of anemia.  Feels generally weak and fatigued.  Denies any known sick contacts.  Did not check temperature at home 100.3 on arrival to the ED.  The history is provided by the patient.  Weakness Associated symptoms: arthralgias, cough, fever and myalgias   Associated symptoms: no chest pain, no dizziness, no dysuria and no headaches        Home Medications Prior to Admission medications   Medication Sig Start Date End Date Taking? Authorizing Provider  Alcohol Swabs (B-D SINGLE USE SWABS REGULAR) PADS 1 Units by Does not apply route 3 (three) times daily as needed. 07/14/18   [name], [place], DO  atorvastatin (LIPITOR) 80 MG tablet Take 1 tablet (80 mg total) by mouth daily. Patient taking differently: Take 80 mg by mouth daily. hs 12/20/18   Melene Plan, MD  Blood Glucose Calibration (ACCU-CHEK AVIVA) SOLN 1 Units by In Vitro route 3 (three) times daily as needed. 07/14/18   [name], [place], DO  calcitRIOL (ROCALTROL) 0.25  MCG capsule Take 0.25 mcg by mouth daily. 01/29/21   [provider]  Calcium Carbonate (CALCIUM 600 PO) Take 600 mg by mouth 2 (two) times daily.    [provider]  carvedilol (COREG) 25 MG tablet Take 1 tablet (25 mg total) by mouth 2 (two) times daily with a meal. 05/09/19   Royal Hawthorn, NP  cholecalciferol (VITAMIN D) 25 MCG (1000 UNIT) tablet Take 1,000 Units by mouth daily.    [provider]  diltiazem (CARDIZEM SR) 120 MG 12 hr capsule Take 1 capsule (120 mg total) by mouth 2 (two) times daily. 05/09/19 02/05/21  Royal Hawthorn, NP  ferrous sulfate 325 (65 FE) MG tablet Take 1 tablet (325 mg total) by mouth every other day. Patient taking differently: Take 325 mg by mouth daily with breakfast. 12/20/18   Melene Plan, MD  glucose blood (ACCU-CHEK AVIVA PLUS) test strip Use as instructed 07/14/18   [name], [place], DO  Insulin Pen Needle (B-D ULTRAFINE III SHORT PEN) 31G X 8 MM MISC CHECK BLOOD SUGARS ONCE IN MORNING BEFORE FOOD AND ONCE IN THE EVENING. 07/14/18   Talbert Forest, [place], DO  Lancets (ACCU-CHEK SOFT TOUCH) lancets Use as instructed 07/14/18   [name], [place], DO  oxyCODONE-acetaminophen (PERCOCET/ROXICET) 5-325 MG tablet Take 1 tablet by mouth every 6 (six) hours as needed. 02/05/21   Lars Mage, PA-C  Allergies    Aspirin    Review of Systems   Review of Systems  Constitutional:  Positive for activity change, appetite change, fatigue and fever.  HENT:  Negative for congestion and sore throat.   Eyes:  Negative for visual disturbance.  Respiratory:  Positive for cough. Negative for choking.   Cardiovascular:  Negative for chest pain.  Genitourinary:  Negative for dysuria and hematuria.  Musculoskeletal:  Positive for arthralgias and myalgias.  Skin:  Negative for rash.  Neurological:  Positive for weakness. Negative for dizziness and headaches.   all other systems are negative except as noted in the HPI and PMH.    Physical  Exam Updated Vital Signs BP (!) 189/73 (BP Location: Left Arm)   Pulse 80   Temp 100.3 F (37.9 C) (Oral)   Resp 18   Ht 5\' 6"  (1.676 m)   Wt 59 kg   SpO2 100%   BMI 20.98 kg/m  Physical Exam Vitals and nursing note reviewed.  Constitutional:      General: She is not in acute distress.    Appearance: She is well-developed.     Comments: Feels warm  HENT:     Head: Normocephalic and atraumatic.     Mouth/Throat:     Mouth: Mucous membranes are dry.     Pharynx: No oropharyngeal exudate.  Eyes:     Pupils: Pupils are equal, round, and reactive to light.     Comments: Pale conjunctiva, dry mucous membrane  Neck:     Comments: No meningismus. Cardiovascular:     Rate and Rhythm: Normal rate and regular rhythm.     Heart sounds: Normal heart sounds. No murmur heard. Pulmonary:     Effort: Pulmonary effort is normal. No respiratory distress.     Breath sounds: Normal breath sounds.  Abdominal:     Palpations: Abdomen is soft.     Tenderness: There is no abdominal tenderness. There is no guarding or rebound.  Musculoskeletal:        General: No tenderness. Normal range of motion.     Cervical back: Normal range of motion and neck supple.     Comments: Right upper extremity fistula with thrill.  Skin:    General: Skin is warm.  Neurological:     Mental Status: She is alert and oriented to person, place, and time.     Cranial Nerves: No cranial nerve deficit.     Motor: No abnormal muscle tone.     Coordination: Coordination normal.     Comments:  5/5 strength throughout. CN 2-12 intact.Equal grip strength.   Psychiatric:        Behavior: Behavior normal.     ED Results / Procedures / Treatments   Labs (all labs ordered are listed, but only abnormal results are displayed) Labs Reviewed  CULTURE, BLOOD (ROUTINE X 2)  CULTURE, BLOOD (ROUTINE X 2)  URINE CULTURE  RESP PANEL BY RT-PCR (RSV, FLU A&B, COVID)  [id number]  SARS CORONAVIRUS 2 BY RT PCR  LACTIC ACID, PLASMA   LACTIC ACID, PLASMA  COMPREHENSIVE METABOLIC PANEL  CBC WITH DIFFERENTIAL/PLATELET  PROTIME-INR  URINALYSIS, ROUTINE W REFLEX MICROSCOPIC  TYPE AND SCREEN  TROPONIN I (HIGH SENSITIVITY)    EKG None  Radiology No results found.  Procedures Procedures  {Document cardiac monitor, telemetry assessment procedure when appropriate:1}  Medications Ordered in ED Medications  acetaminophen (TYLENOL) tablet 650 mg (has no administration in time range)    ED Course/ Medical Decision Making/ A&P   {  Click here for ABCD2, HEART and other calculatorsREFRESH Note before signing :1}                          Medical Decision Making Amount and/or Complexity of Data Reviewed Labs: ordered. Decision-making details documented in ED Course. Radiology: ordered and independent interpretation performed. Decision-making details documented in ED Course. ECG/medicine tests: ordered and independent interpretation performed. Decision-making details documented in ED Course.   ESRD patient with fatigue, chills, body aches, fever and cough.  Vital stable, no hypoxia or increased work of breathing.  Infectious workup pursued including blood cultures, chest x-ray and urinalysis.  {Document critical care time when appropriate:1} {Document review of labs and clinical decision tools ie heart score, Chads2Vasc2 etc:1}  {Document your independent review of radiology images, and any outside records:1} {Document your discussion with family members, caretakers, and with consultants:1} {Document social determinants of health affecting pt's care:1} {Document your decision making why or why not admission, treatments were needed:1} Final Clinical Impression(s) / ED Diagnoses Final diagnoses:  None    Rx / DC Orders ED Discharge Orders     None

## 2022-06-16 DIAGNOSIS — K5520 Angiodysplasia of colon without hemorrhage: Secondary | ICD-10-CM | POA: Diagnosis not present

## 2022-06-16 DIAGNOSIS — I120 Hypertensive chronic kidney disease with stage 5 chronic kidney disease or end stage renal disease: Secondary | ICD-10-CM | POA: Diagnosis present

## 2022-06-16 DIAGNOSIS — Z8719 Personal history of other diseases of the digestive system: Secondary | ICD-10-CM | POA: Diagnosis not present

## 2022-06-16 DIAGNOSIS — K8020 Calculus of gallbladder without cholecystitis without obstruction: Secondary | ICD-10-CM | POA: Diagnosis present

## 2022-06-16 DIAGNOSIS — N186 End stage renal disease: Secondary | ICD-10-CM

## 2022-06-16 DIAGNOSIS — N390 Urinary tract infection, site not specified: Secondary | ICD-10-CM

## 2022-06-16 DIAGNOSIS — F17210 Nicotine dependence, cigarettes, uncomplicated: Secondary | ICD-10-CM | POA: Diagnosis present

## 2022-06-16 DIAGNOSIS — Z992 Dependence on renal dialysis: Secondary | ICD-10-CM | POA: Diagnosis not present

## 2022-06-16 DIAGNOSIS — M898X9 Other specified disorders of bone, unspecified site: Secondary | ICD-10-CM | POA: Diagnosis present

## 2022-06-16 DIAGNOSIS — N184 Chronic kidney disease, stage 4 (severe): Secondary | ICD-10-CM

## 2022-06-16 DIAGNOSIS — E785 Hyperlipidemia, unspecified: Secondary | ICD-10-CM | POA: Diagnosis present

## 2022-06-16 DIAGNOSIS — E1122 Type 2 diabetes mellitus with diabetic chronic kidney disease: Secondary | ICD-10-CM

## 2022-06-16 DIAGNOSIS — Z833 Family history of diabetes mellitus: Secondary | ICD-10-CM | POA: Diagnosis not present

## 2022-06-16 DIAGNOSIS — K31819 Angiodysplasia of stomach and duodenum without bleeding: Secondary | ICD-10-CM | POA: Diagnosis not present

## 2022-06-16 DIAGNOSIS — D631 Anemia in chronic kidney disease: Secondary | ICD-10-CM | POA: Diagnosis present

## 2022-06-16 DIAGNOSIS — Z8249 Family history of ischemic heart disease and other diseases of the circulatory system: Secondary | ICD-10-CM | POA: Diagnosis not present

## 2022-06-16 DIAGNOSIS — Z886 Allergy status to analgesic agent status: Secondary | ICD-10-CM | POA: Diagnosis not present

## 2022-06-16 DIAGNOSIS — Z808 Family history of malignant neoplasm of other organs or systems: Secondary | ICD-10-CM | POA: Diagnosis not present

## 2022-06-16 DIAGNOSIS — D500 Iron deficiency anemia secondary to blood loss (chronic): Secondary | ICD-10-CM | POA: Diagnosis not present

## 2022-06-16 DIAGNOSIS — Z79899 Other long term (current) drug therapy: Secondary | ICD-10-CM | POA: Diagnosis not present

## 2022-06-16 DIAGNOSIS — Z86010 Personal history of colonic polyps: Secondary | ICD-10-CM

## 2022-06-16 DIAGNOSIS — D62 Acute posthemorrhagic anemia: Secondary | ICD-10-CM | POA: Diagnosis present

## 2022-06-16 DIAGNOSIS — R195 Other fecal abnormalities: Secondary | ICD-10-CM | POA: Diagnosis not present

## 2022-06-16 DIAGNOSIS — I10 Essential (primary) hypertension: Secondary | ICD-10-CM

## 2022-06-16 DIAGNOSIS — Z8711 Personal history of peptic ulcer disease: Secondary | ICD-10-CM | POA: Diagnosis not present

## 2022-06-16 DIAGNOSIS — K2289 Other specified disease of esophagus: Secondary | ICD-10-CM | POA: Diagnosis not present

## 2022-06-16 DIAGNOSIS — K921 Melena: Secondary | ICD-10-CM

## 2022-06-16 DIAGNOSIS — Z8673 Personal history of transient ischemic attack (TIA), and cerebral infarction without residual deficits: Secondary | ICD-10-CM | POA: Diagnosis not present

## 2022-06-16 DIAGNOSIS — Z1152 Encounter for screening for COVID-19: Secondary | ICD-10-CM | POA: Diagnosis not present

## 2022-06-16 DIAGNOSIS — Z794 Long term (current) use of insulin: Secondary | ICD-10-CM | POA: Diagnosis not present

## 2022-06-16 DIAGNOSIS — R748 Abnormal levels of other serum enzymes: Secondary | ICD-10-CM | POA: Diagnosis present

## 2022-06-16 DIAGNOSIS — K5521 Angiodysplasia of colon with hemorrhage: Secondary | ICD-10-CM | POA: Diagnosis present

## 2022-06-16 DIAGNOSIS — N3000 Acute cystitis without hematuria: Secondary | ICD-10-CM | POA: Diagnosis present

## 2022-06-16 DIAGNOSIS — Z809 Family history of malignant neoplasm, unspecified: Secondary | ICD-10-CM | POA: Diagnosis not present

## 2022-06-16 DIAGNOSIS — Z9189 Other specified personal risk factors, not elsewhere classified: Secondary | ICD-10-CM

## 2022-06-16 LAB — CBC
HCT: 27.1 % — ABNORMAL LOW (ref 36.0–46.0)
Hemoglobin: 8.7 g/dL — ABNORMAL LOW (ref 12.0–15.0)
MCH: 29.4 pg (ref 26.0–34.0)
MCHC: 32.1 g/dL (ref 30.0–36.0)
MCV: 91.6 fL (ref 80.0–100.0)
Platelets: 234 10*3/uL (ref 150–400)
RBC: 2.96 MIL/uL — ABNORMAL LOW (ref 3.87–5.11)
RDW: 15 % (ref 11.5–15.5)
WBC: 6.9 10*3/uL (ref 4.0–10.5)
nRBC: 0 % (ref 0.0–0.2)

## 2022-06-16 LAB — TYPE AND SCREEN: ABO/RH(D): O POS

## 2022-06-16 LAB — CBC WITH DIFFERENTIAL/PLATELET
Abs Immature Granulocytes: 0.02 10*3/uL (ref 0.00–0.07)
Basophils Absolute: 0 10*3/uL (ref 0.0–0.1)
Basophils Relative: 0 %
Eosinophils Absolute: 0.1 10*3/uL (ref 0.0–0.5)
Eosinophils Relative: 1 %
HCT: 21.2 % — ABNORMAL LOW (ref 36.0–46.0)
Hemoglobin: 6.5 g/dL — CL (ref 12.0–15.0)
Immature Granulocytes: 0 %
Lymphocytes Relative: 29 %
Lymphs Abs: 1.7 10*3/uL (ref 0.7–4.0)
MCH: 29 pg (ref 26.0–34.0)
MCHC: 30.7 g/dL (ref 30.0–36.0)
MCV: 94.6 fL (ref 80.0–100.0)
Monocytes Absolute: 0.5 10*3/uL (ref 0.1–1.0)
Monocytes Relative: 9 %
Neutro Abs: 3.7 10*3/uL (ref 1.7–7.7)
Neutrophils Relative %: 61 %
Platelets: 260 10*3/uL (ref 150–400)
RBC: 2.24 MIL/uL — ABNORMAL LOW (ref 3.87–5.11)
RDW: 15.1 % (ref 11.5–15.5)
WBC: 6 10*3/uL (ref 4.0–10.5)
nRBC: 0 % (ref 0.0–0.2)

## 2022-06-16 LAB — URINALYSIS, ROUTINE W REFLEX MICROSCOPIC
Bilirubin Urine: NEGATIVE
Glucose, UA: >=500 mg/dL — AB
Ketones, ur: 5 mg/dL — AB
Nitrite: NEGATIVE
Protein, ur: >=300 mg/dL — AB
Specific Gravity, Urine: 1.008 (ref 1.005–1.030)
WBC, UA: >50 WBC/hpf (ref 0–5)
pH: 8 (ref 5.0–8.0)

## 2022-06-16 LAB — COMPREHENSIVE METABOLIC PANEL
ALT: 8 U/L (ref 0–44)
AST: 11 U/L — ABNORMAL LOW (ref 15–41)
Albumin: 3.1 g/dL — ABNORMAL LOW (ref 3.5–5.0)
Alkaline Phosphatase: 357 U/L — ABNORMAL HIGH (ref 38–126)
Anion gap: 13 (ref 5–15)
BUN: 53 mg/dL — ABNORMAL HIGH (ref 8–23)
CO2: 19 mmol/L — ABNORMAL LOW (ref 22–32)
Calcium: 9.6 mg/dL (ref 8.9–10.3)
Chloride: 105 mmol/L (ref 98–111)
Creatinine, Ser: 7.79 mg/dL — ABNORMAL HIGH (ref 0.44–1.00)
GFR, Estimated: 5 mL/min — ABNORMAL LOW (ref >60)
Glucose, Bld: 108 mg/dL — ABNORMAL HIGH (ref 70–99)
Potassium: 3.5 mmol/L (ref 3.5–5.1)
Sodium: 137 mmol/L (ref 135–145)
Total Bilirubin: 0.5 mg/dL (ref 0.3–1.2)
Total Protein: 5.6 g/dL — ABNORMAL LOW (ref 6.5–8.1)

## 2022-06-16 LAB — BPAM RBC
Blood Product Expiration Date: 202406082359
Blood Product Expiration Date: 202406092359
ISSUE DATE / TIME: 202405140115

## 2022-06-16 LAB — LACTIC ACID, PLASMA: Lactic Acid, Venous: 1.1 mmol/L (ref 0.5–1.9)

## 2022-06-16 LAB — BASIC METABOLIC PANEL
Anion gap: 16 — ABNORMAL HIGH (ref 5–15)
BUN: 57 mg/dL — ABNORMAL HIGH (ref 8–23)
CO2: 17 mmol/L — ABNORMAL LOW (ref 22–32)
Calcium: 9.7 mg/dL (ref 8.9–10.3)
Chloride: 103 mmol/L (ref 98–111)
Creatinine, Ser: 7.64 mg/dL — ABNORMAL HIGH (ref 0.44–1.00)
GFR, Estimated: 5 mL/min — ABNORMAL LOW (ref >60)
Glucose, Bld: 102 mg/dL — ABNORMAL HIGH (ref 70–99)
Potassium: 3.6 mmol/L (ref 3.5–5.1)
Sodium: 136 mmol/L (ref 135–145)

## 2022-06-16 LAB — HEMOGLOBIN AND HEMATOCRIT, BLOOD
HCT: 30.5 % — ABNORMAL LOW (ref 36.0–46.0)
HCT: 30.1 % — ABNORMAL LOW (ref 36.0–46.0)
Hemoglobin: 9.9 g/dL — ABNORMAL LOW (ref 12.0–15.0)
Hemoglobin: 10.1 g/dL — ABNORMAL LOW (ref 12.0–15.0)

## 2022-06-16 LAB — RESP PANEL BY RT-PCR (RSV, FLU A&B, COVID)  RVPGX2
Influenza A by PCR: NEGATIVE
Influenza B by PCR: NEGATIVE
Resp Syncytial Virus by PCR: NEGATIVE
SARS Coronavirus 2 by RT PCR: NEGATIVE

## 2022-06-16 LAB — TROPONIN I (HIGH SENSITIVITY)
Troponin I (High Sensitivity): 83 ng/L — ABNORMAL HIGH (ref <18)
Troponin I (High Sensitivity): 86 ng/L — ABNORMAL HIGH (ref <18)

## 2022-06-16 LAB — HIV ANTIBODY (ROUTINE TESTING W REFLEX): HIV Screen 4th Generation wRfx: NONREACTIVE

## 2022-06-16 LAB — CULTURE, BLOOD (ROUTINE X 2): Culture: NO GROWTH

## 2022-06-16 LAB — HEPATITIS B SURFACE ANTIGEN: Hepatitis B Surface Ag: NONREACTIVE

## 2022-06-16 LAB — POC OCCULT BLOOD, ED: Fecal Occult Bld: POSITIVE — AB

## 2022-06-16 LAB — PREPARE RBC (CROSSMATCH)

## 2022-06-16 LAB — PHOSPHORUS: Phosphorus: 3.7 mg/dL (ref 2.5–4.6)

## 2022-06-16 LAB — MRSA NEXT GEN BY PCR, NASAL: MRSA by PCR Next Gen: NOT DETECTED

## 2022-06-16 MED ORDER — ACETAMINOPHEN 325 MG PO TABS: 650.0000 mg | ORAL_TABLET | Freq: Four times a day (QID) | ORAL | Status: DC | PRN

## 2022-06-16 MED ORDER — SODIUM CHLORIDE 0.9 % IV SOLN
1.0000 g | INTRAVENOUS | Status: DC
  Administered 2022-06-16: 1 g via INTRAVENOUS
  Filled 2022-06-16: qty 10

## 2022-06-16 MED ORDER — PANTOPRAZOLE SODIUM 40 MG IV SOLR
40.0000 mg | Freq: Two times a day (BID) | INTRAVENOUS | Status: DC
  Administered 2022-06-16 (×2): 40 mg via INTRAVENOUS
  Filled 2022-06-16 (×2): qty 10

## 2022-06-16 MED ORDER — ONDANSETRON HCL 4 MG PO TABS: 4.0000 mg | ORAL_TABLET | Freq: Four times a day (QID) | ORAL | Status: DC | PRN

## 2022-06-16 MED ORDER — VITAMIN D 25 MCG (1000 UNIT) PO TABS
1000.0000 [IU] | ORAL_TABLET | Freq: Every day | ORAL | Status: DC
  Administered 2022-06-16 – 2022-06-17 (×2): 1000 [IU] via ORAL
  Filled 2022-06-16 (×2): qty 1

## 2022-06-16 MED ORDER — ATORVASTATIN CALCIUM 80 MG PO TABS
80.0000 mg | ORAL_TABLET | Freq: Every day | ORAL | Status: DC
  Administered 2022-06-16 – 2022-06-17 (×2): 80 mg via ORAL
  Filled 2022-06-16: qty 2
  Filled 2022-06-16: qty 1

## 2022-06-16 MED ORDER — ACETAMINOPHEN 650 MG RE SUPP: 650.0000 mg | Freq: Four times a day (QID) | RECTAL | Status: DC | PRN

## 2022-06-16 MED ORDER — LABETALOL HCL 5 MG/ML IV SOLN
20.0000 mg | INTRAVENOUS | Status: DC | PRN
  Administered 2022-06-16: 20 mg via INTRAVENOUS
  Filled 2022-06-16: qty 4

## 2022-06-16 MED ORDER — DARBEPOETIN ALFA 100 MCG/0.5ML IJ SOSY
100.0000 ug | PREFILLED_SYRINGE | INTRAMUSCULAR | Status: DC
  Administered 2022-06-16: 100 ug via SUBCUTANEOUS
  Filled 2022-06-16: qty 0.5

## 2022-06-16 MED ORDER — SODIUM CHLORIDE 0.9% IV SOLUTION: Freq: Once | INTRAVENOUS | Status: AC

## 2022-06-16 MED ORDER — OXYCODONE-ACETAMINOPHEN 5-325 MG PO TABS: 1.0000 | ORAL_TABLET | Freq: Four times a day (QID) | ORAL | Status: DC | PRN

## 2022-06-16 MED ORDER — ENOXAPARIN SODIUM 40 MG/0.4ML IJ SOSY: 40.0000 mg | PREFILLED_SYRINGE | INTRAMUSCULAR | Status: DC

## 2022-06-16 MED ORDER — DILTIAZEM HCL ER 60 MG PO CP12
120.0000 mg | ORAL_CAPSULE | Freq: Two times a day (BID) | ORAL | Status: DC
  Administered 2022-06-16 – 2022-06-17 (×2): 120 mg via ORAL
  Filled 2022-06-16 (×4): qty 2

## 2022-06-16 MED ORDER — CALCIUM CARBONATE 1250 (500 CA) MG PO TABS
1.0000 | ORAL_TABLET | Freq: Two times a day (BID) | ORAL | Status: DC
  Administered 2022-06-16 – 2022-06-17 (×3): 1250 mg via ORAL
  Filled 2022-06-16 (×4): qty 1

## 2022-06-16 MED ORDER — FERROUS SULFATE 325 (65 FE) MG PO TABS
325.0000 mg | ORAL_TABLET | Freq: Every day | ORAL | Status: DC
  Administered 2022-06-16 – 2022-06-17 (×2): 325 mg via ORAL
  Filled 2022-06-16 (×2): qty 1

## 2022-06-16 MED ORDER — CARVEDILOL 25 MG PO TABS
25.0000 mg | ORAL_TABLET | Freq: Two times a day (BID) | ORAL | Status: DC
  Administered 2022-06-17: 25 mg via ORAL
  Filled 2022-06-16: qty 2

## 2022-06-16 MED ORDER — TRAZODONE HCL 50 MG PO TABS
25.0000 mg | ORAL_TABLET | Freq: Every evening | ORAL | Status: DC | PRN
  Administered 2022-06-16: 25 mg via ORAL
  Filled 2022-06-16: qty 1

## 2022-06-16 MED ORDER — CHLORHEXIDINE GLUCONATE CLOTH 2 % EX PADS: 6.0000 | MEDICATED_PAD | Freq: Every day | CUTANEOUS | Status: DC

## 2022-06-16 MED ORDER — CALCITRIOL 0.25 MCG PO CAPS
0.2500 ug | ORAL_CAPSULE | Freq: Every day | ORAL | Status: DC
  Administered 2022-06-17: 0.25 ug via ORAL
  Filled 2022-06-16: qty 1

## 2022-06-16 MED ORDER — PANTOPRAZOLE SODIUM 40 MG IV SOLR
40.0000 mg | Freq: Once | INTRAVENOUS | Status: AC
  Administered 2022-06-16: 40 mg via INTRAVENOUS
  Filled 2022-06-16: qty 10

## 2022-06-16 MED ORDER — ONDANSETRON HCL 4 MG/2ML IJ SOLN: 4.0000 mg | Freq: Four times a day (QID) | INTRAMUSCULAR | Status: DC | PRN

## 2022-06-16 NOTE — H&P (View-Only) (Signed)
Consultation Note   Referring Provider:  [facility] PCP: Loura Back, NP Primary Gastroenterologist: Gentry Fitz        Reason for consultation: anemia, heme positive stool  DOA: 06/15/2022         Hospital Day: 2   Assessment and Plan   71 yo female with multiple co-morbidities including small bowel AVMs presenting with dark, heme positive stool and slight worsening of chronic anemia Presenting hgb 6.5 down from baseline of 7-8. Limited historian but sounds like stools have been loose since starting dialysis several months ago but they became dark only a few weeks ago. Takes oral iron. May have slow or intermittent bleeding from small bowel AVMs.   Chronically elevated alk phos.  Possibly related to CKD. No biliary duct dilation on CT scan in July 2023.   Cholelithiasis  UTI On Rocephin  History of reflux esophagitis in 2019.  No reflux symptoms. Not on acid blocker at home  History of colon polyps 1 to 2 small tubular adenomas in Dec 2019. A 5 year follow up colonoscopy was recommended. Based on updated guidelines this could be changed to a 7 year follow up  ESRD on HD TTS    History of Present Illness Patient is a 72 y.o. year old female whose past medical history includes but is not necessarily limited to DM2, HTN, HLD, ESRD on HD TTS, iron deficiency anemia, small bowel AVMs, adenomatous colon polyps, depression. She presented to hospital yesterday with fatigue and dark stools. Limited historian but says her stools have been loose since started dialysis ~ 6 months ago. A few weeks ago her stools turned dark in color. She takes iron but has been taking it several months without stool color changes. She doesn't take Pepto Bismol. No NSAID use. She has no N/V or abdominal pain.   Significant studies:  Normal WBC. Hgb 6.5. Alk phos 357, normal bilirubin. Normal AST/ ALT.  Hgb improved to 9.9 with 2 u PRBCs.   Labs and  Imaging: Recent Labs    06/15/22 2320 06/16/22 0525 06/16/22 1113  WBC 6.0 6.9  --   HGB 6.5* 8.7* 9.9*  HCT 21.2* 27.1* 30.5*  PLT 260 234  --    Recent Labs    06/15/22 2320 06/16/22 0525  NA 137 136  K 3.5 3.6  CL 105 103  CO2 19* 17*  GLUCOSE 108* 102*  BUN 53* 57*  CREATININE 7.79* 7.64*  CALCIUM 9.6 9.7   Recent Labs    06/15/22 2320  PROT 5.6*  ALBUMIN 3.1*  AST 11*  ALT 8  ALKPHOS 357*  BILITOT 0.5   Recent Labs    06/16/22 1113  HEPBSAG NON REACTIVE   Recent Labs    06/15/22 2320  LABPROT 14.7  INR 1.1    Previous GI Evaluation:   01/07/2018 EGD for iron deficiency anemia Dr. 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 Marina Goodell: 3 4-8 mm polyps in sigmoid colon and in the transverse colon, otherwise normal, pathology was a mix of hyperplastic and tubular adenomatous recall recommended in 5 years based on  surveillance guidelines at that time. Colon, polyp(s), transverse and sigmoid - TUBULAR ADENOMA(S). - HYPERPLASTIC POLYP(S). - HIGH GRADE DYSPLASIA IS NOT IDENTIFIED   Principal Problem:   Acute blood loss anemia Active Problems:   Essential hypertension   Acute lower UTI   End-stage renal disease on hemodialysis (HCC)   Dyslipidemia   Type 2 diabetes mellitus with chronic kidney disease, without long-term current use of insulin (HCC)     Past Medical History:  Diagnosis Date   Anemia    Cellulitis of scalp 02/2015   Chronic kidney disease 01/20/2021   stage V   Depression    Diabetes mellitus without complication (HCC)    History of blood transfusion    Hyperlipidemia    Hypertension     Past Surgical History:  Procedure Laterality Date   ABDOMINAL HYSTERECTOMY     AV FISTULA PLACEMENT Right 02/05/2021   Procedure: INSERTION OF RIGHT ARM  ARTERIOVENOUS (AV) GORE-TEX GRAFT;  Surgeon: Leonie Douglas, MD;  Location: [facility] OR;  Service: Vascular;  Laterality: Right;   BIOPSY  01/07/2018   Procedure: BIOPSY;  Surgeon: Hilarie Fredrickson, MD;  Location: [facility] ENDOSCOPY;  Service: Endoscopy;;  Clotest   COLONOSCOPY WITH PROPOFOL N/A 01/07/2018   Procedure: COLONOSCOPY WITH PROPOFOL;  Surgeon: Hilarie Fredrickson, MD;  Location[facility] ENDOSCOPY;  Service: Endoscopy;  Laterality: N/A;   ESOPHAGOGASTRODUODENOSCOPY (EGD) WITH PROPOFOL N/A 01/07/2018   Procedure: ESOPHAGOGASTRODUODENOSCOPY (EGD) WITH PROPOFOL;  Surgeon: Hilarie Fredrickson, MD;  Location: [facility] ENDOSCOPY;  Service: Endoscopy;  Laterality: N/A;   ESOPHAGOGASTRODUODENOSCOPY (EGD) WITH PROPOFOL N/A 12/14/2018   Procedure: ESOPHAGOGASTRODUODENOSCOPY (EGD) WITH PROPOFOL;  Surgeon: Hilarie Fredrickson, MD;  Location: [facility] ENDOSCOPY;  Service: Endoscopy;  Laterality: N/A;   HEMOSTASIS CONTROL  12/14/2018   Procedure: HEMOSTASIS CONTROL;  Surgeon: Hilarie Fredrickson, MD;  Location[facility] ENDOSCOPY;  Service: Endoscopy;;   POLYPECTOMY  01/07/2018   Procedure: POLYPECTOMY;  Surgeon: Hilarie Fredrickson, MD;  Location: [facility] ENDOSCOPY;  Service: Endoscopy;;    Family History  Problem Relation Age of Onset   Diabetes Mother    Cancer Mother    Diabetes Sister    Hypertension Sister    Cancer Sister 13       Bone   Diabetes Brother    Diabetes Sister    Diabetes Sister     Prior to Admission medications   Medication Sig Start Date End Date Taking? Authorizing Provider  amLODipine (NORVASC) 10 MG tablet Take 10 mg by mouth daily. 01/30/22  Yes [provider]  atorvastatin (LIPITOR) 80 MG tablet Take 1 tablet (80 mg total) by mouth daily. 12/20/18  Yes Melene Plan, MD  AURYXIA 1 GM 210 MG(Fe) tablet Take 420 mg by mouth 3 (three) times daily. 04/12/22  Yes [provider]  B Complex-C-Folic Acid (DIALYVITE TABLET) TABS Take 1 tablet by mouth daily. 03/05/22  Yes [provider]  carvedilol (COREG) 25 MG  tablet Take 1 tablet (25 mg total) by mouth 2 (two) times daily with a meal. 05/09/19  Yes Royal Hawthorn, NP  cinacalcet (SENSIPAR) 60 MG tablet Take 180 mg by mouth daily. 04/21/22  Yes [provider]  Alcohol Swabs (B-D SINGLE USE SWABS REGULAR) PADS 1 Units by Does not apply route 3 (three) times daily as needed. 07/14/18   [name], [place], DO  calcitRIOL (ROCALTROL) 0.25 MCG capsule Take 0.25 mcg by mouth daily. Patient not taking: Reported on 06/16/2022 01/29/21   [provider]  Calcium Carbonate (  CALCIUM 600 PO) Take 600 mg by mouth 2 (two) times daily. Patient not taking: Reported on 06/16/2022    [provider]  cholecalciferol (VITAMIN D) 25 MCG (1000 UNIT) tablet Take 1,000 Units by mouth daily. Patient not taking: Reported on 06/16/2022    [provider]  diltiazem (CARDIZEM SR) 120 MG 12 hr capsule Take 1 capsule (120 mg total) by mouth 2 (two) times daily. Patient not taking: Reported on 06/16/2022 05/09/19 02/05/21  Royal Hawthorn, NP  ferrous sulfate 325 (65 FE) MG tablet Take 1 tablet (325 mg total) by mouth every other day. Patient not taking: Reported on 06/16/2022 12/20/18   Melene Plan, MD  glucose blood (ACCU-CHEK AVIVA PLUS) test strip Use as instructed 07/14/18   [name], [place], DO  Lancets (ACCU-CHEK SOFT TOUCH) lancets Use as instructed 07/14/18   [name], [place], DO  oxyCODONE-acetaminophen (PERCOCET/ROXICET) 5-325 MG tablet Take 1 tablet by mouth every 6 (six) hours as needed. Patient not taking: Reported on 06/16/2022 02/05/21   Lars Mage, PA-C    Current Facility-Administered Medications  Medication Dose Route Frequency Provider Last Rate Last Admin   acetaminophen (TYLENOL) tablet 650 mg  650 mg Oral Q6H PRN Mansy, Jan A, MD       Or   acetaminophen (TYLENOL) suppository 650 mg  650 mg Rectal Q6H PRN Mansy, Jan A, MD       atorvastatin (LIPITOR) tablet 80 mg  80 mg Oral Daily Mansy, Jan A, MD       calcitRIOL  (ROCALTROL) capsule 0.25 mcg  0.25 mcg Oral Daily Mansy, Jan A, MD       calcium carbonate (OS-CAL - dosed in mg of elemental calcium) tablet 1,250 mg  1 tablet Oral BID Mansy, Jan A, MD   1,250 mg at 06/16/22 0901   carvedilol (COREG) tablet 25 mg  25 mg Oral BID WC Mansy, Jan A, MD       cefTRIAXone (ROCEPHIN) 1 g in sodium chloride 0.9 % 100 mL IVPB  1 g Intravenous Q24H Mansy, Jan A, MD       Chlorhexidine Gluconate Cloth 2 % PADS 6 each  6 each Topical Q0600 Delano Metz, MD       cholecalciferol (VITAMIN D3) 25 MCG (1000 UNIT) tablet 1,000 Units  1,000 Units Oral Daily Mansy, Jan A, MD       diltiazem (CARDIZEM SR) 12 hr capsule 120 mg  120 mg Oral BID Mansy, Jan A, MD       ferrous sulfate tablet 325 mg  325 mg Oral Q breakfast Mansy, Jan A, MD   325 mg at 06/16/22 0900   labetalol (NORMODYNE) injection 20 mg  20 mg Intravenous Q3H PRN Mansy, Jan A, MD   20 mg at 06/16/22 0455   ondansetron (ZOFRAN) tablet 4 mg  4 mg Oral Q6H PRN Mansy, Jan A, MD       Or   ondansetron [facility]) injection 4 mg  4 mg Intravenous Q6H PRN Mansy, Jan A, MD       oxyCODONE-acetaminophen (PERCOCET/ROXICET) 5-325 MG per tablet 1 tablet  1 tablet Oral Q6H PRN Mansy, Jan A, MD       pantoprazole (PROTONIX) injection 40 mg  40 mg Intravenous Q12H Mansy, Jan A, MD   40 mg at 06/16/22 0901   traZODone (DESYREL) tablet 25 mg  25 mg Oral QHS PRN Mansy, Vernetta Honey, MD        Allergies as of 06/15/2022 - Review  Complete 06/15/2022  Allergen Reaction Noted   Aspirin Other (See Comments) 01/14/2012    Social History   Socioeconomic History   Marital status: Single    Spouse name: Not on file   Number of children: 2   Years of education: Not on file   Highest education level: Not on file  Occupational History   Occupation: Retired  Tobacco Use   Smoking status: Every Day    Packs/day: 0.50    Years: 53.00    Additional pack years: 0.00    Total pack years: 26.50    Types: Cigarettes   Smokeless tobacco: Never   Vaping Use   Vaping Use: Never used  Substance and Sexual Activity   Alcohol use: No    Alcohol/week: 0.0 standard drinks of alcohol   Drug use: No   Sexual activity: Not on file  Other Topics Concern   Not on file  Social History Narrative   Not on file   Social Determinants of Health   Financial Resource Strain: Not on file  Food Insecurity: Not on file  Transportation Needs: Not on file  Physical Activity: Not on file  Stress: Not on file  Social Connections: Not on file  Intimate Partner Violence: Not on file    Code Status   Code Status: Full Code  Review of Systems: All systems reviewed and negative except where noted in HPI.  Physical Exam: Vital signs in last 24 hours: Temp:  [98 F (36.7 C)-100.3 F (37.9 C)] 98.6 F (37 C) (05/14 0950) Pulse Rate:  [66-81] 70 (05/14 0950) Resp:  [11-27] 18 (05/14 0950) BP: (149-198)/(64-123) 198/69 (05/14 0950) SpO2:  [96 %-100 %] 98 % (05/14 0950) Weight:  [58.5 kg-59 kg] 58.5 kg (05/14 0642) Last BM Date : 06/10/22  General:  Pleasant thin female in NAD Psych:  Cooperative. Normal mood and affect Eyes: Pupils equal Ears:  Normal auditory acuity Nose: No deformity, discharge or lesions Neck:  Supple, no masses felt Lungs:  Clear to auscultation.  Heart:  Regular rate, regular rhythm.  Abdomen:  Soft, nondistended, nontender, active bowel sounds, no masses felt Rectal :  Deferred Msk: Symmetrical without gross deformities.  Neurologic:  Alert, oriented, grossly normal neurologically Extremities : No edema Skin:  Intact without significant lesions.    Intake/Output from previous day: 05/13 0701 - 05/14 0700 In: 450 [I.V.:100; IV Piggyback:350] Out: -  Intake/Output this shift:  No intake/output data recorded.  Willette Cluster, NP-C @  06/16/2022, 12:46 PM  GI ATTENDING  History, laboratories, x-rays, prior endoscopy reports reviewed.  Patient seen and examined.  Familiar to me from previous  hospitalizations.  Has recurrent iron deficiency anemia for which she is undergone GI workups.  Presents now with worsening chronic anemia and reports of dark stools.  Stool is heme positive.  On iron.  Her anemia is multifactorial.  Does have a history of nonbleeding gastric ulcer as well as AVMs.  Clinically stable.  Recommend transfusing 2 clinically appropriate hemoglobin level.  Would recommend upper endoscopy/possible enteroscopy this admission.  Possibly tomorrow.  Discussed with patient.The nature of the procedure, as well as the risks, benefits, and alternatives were carefully and thoroughly reviewed with the patient. Ample time for discussion and questions allowed. The patient understood, was satisfied, and agreed to proceed.  She is higher than baseline risk given her comorbidities.  Wilhemina Bonito. Eda Keys., M.D. [facility] Division of Gastroenterology

## 2022-06-16 NOTE — Assessment & Plan Note (Signed)
Continue statin therapy.

## 2022-06-16 NOTE — Assessment & Plan Note (Signed)
-   The patient will be placed on IV Rocephin. - We will follow urine culture.

## 2022-06-16 NOTE — Consult Note (Signed)
Renal Service Consult Note [facility]  Sara Davis 06/16/2022 Maree Krabbe, MD Requesting Physician: Dr. Tyson Babinski  Reason for Consult: ESRD pt w/ symptomatic anemia HPI: The patient is a 72 y.o. year-old w/ PMH as below who presented to ED w/ c/o fatigue, tiredness, not feeling good. In ED temp 100.3, labs were okay except Hb 6.5. CXR/ EKG neg. Pt rec'd 2u prbcs overnight and was admitted. GI consult to be called. We are asked to see for ESRD.    Pt seen in room. States she lives w/ her son and drives herself to dialysis. No recent dialysis issues.   ROS - denies CP, no joint pain, no HA, no blurry vision, no rash, no diarrhea, no nausea/ vomiting, no dysuria, no difficulty voiding   Past Medical History  Past Medical History:  Diagnosis Date   Anemia    Cellulitis of scalp 02/2015   Chronic kidney disease 01/20/2021   stage V   Depression    Diabetes mellitus without complication (HCC)    History of blood transfusion    Hyperlipidemia    Hypertension    Past Surgical History  Past Surgical History:  Procedure Laterality Date   ABDOMINAL HYSTERECTOMY     AV FISTULA PLACEMENT Right 02/05/2021   Procedure: INSERTION OF RIGHT ARM ARTERIOVENOUS (AV) GORE-TEX GRAFT;  Surgeon: Leonie Douglas, MD;  Location: [facility] OR;  Service: Vascular;  Laterality: Right;   BIOPSY  01/07/2018   Procedure: BIOPSY;  Surgeon: Hilarie Fredrickson, MD;  Location: [facility] ENDOSCOPY;  Service: Endoscopy;;  Clotest   COLONOSCOPY WITH PROPOFOL N/A 01/07/2018   Procedure: COLONOSCOPY WITH PROPOFOL;  Surgeon: Hilarie Fredrickson, MD;  Location: [facility] ENDOSCOPY;  Service: Endoscopy;  Laterality: N/A;   ESOPHAGOGASTRODUODENOSCOPY (EGD) WITH PROPOFOL N/A 01/07/2018   Procedure: ESOPHAGOGASTRODUODENOSCOPY (EGD) WITH PROPOFOL;  Surgeon: Hilarie Fredrickson, MD;  Location: [facility] ENDOSCOPY;  Service: Endoscopy;  Laterality: N/A;   ESOPHAGOGASTRODUODENOSCOPY (EGD) WITH PROPOFOL N/A 12/14/2018   Procedure:  ESOPHAGOGASTRODUODENOSCOPY (EGD) WITH PROPOFOL;  Surgeon: Hilarie Fredrickson, MD;  Location: [facility] ENDOSCOPY;  Service: Endoscopy;  Laterality: N/A;   HEMOSTASIS CONTROL  12/14/2018   Procedure: HEMOSTASIS CONTROL;  Surgeon: Hilarie Fredrickson, MD;  Location: [facility] ENDOSCOPY;  Service: Endoscopy;;   POLYPECTOMY  01/07/2018   Procedure: POLYPECTOMY;  Surgeon: Hilarie Fredrickson, MD;  Location: [facility] ENDOSCOPY;  Service: Endoscopy;;   Family History  Family History  Problem Relation Age of Onset   Diabetes Mother    Cancer Mother    Diabetes Sister    Hypertension Sister    Cancer Sister 79       Bone   Diabetes Brother    Diabetes Sister    Diabetes Sister    Social History  reports that she has been smoking cigarettes. She has a 26.50 pack-year smoking history. She has never used smokeless tobacco. She reports that she does not drink alcohol and does not use drugs. Allergies  Allergies  Allergen Reactions   Aspirin Other (See Comments)    Upset stomach, can only take coated aspirin but prefers to not take it at all   Home medications Prior to Admission medications   Medication Sig Start Date End Date Taking? Authorizing Provider  atorvastatin (LIPITOR) 80 MG tablet Take 1 tablet (80 mg total) by mouth daily. Patient taking differently: Take 80 mg by mouth daily. hs 12/20/18  Yes Melene Plan, MD  Alcohol Swabs (B-D SINGLE USE SWABS REGULAR) PADS 1 Units by Does not  apply route 3 (three) times daily as needed. 07/14/18   [name], [place], DO  Blood Glucose Calibration (ACCU-CHEK AVIVA) SOLN 1 Units by In Vitro route 3 (three) times daily as needed. 07/14/18   [name], [place], DO  calcitRIOL (ROCALTROL) 0.25 MCG capsule Take 0.25 mcg by mouth daily. 01/29/21   [provider]  Calcium Carbonate (CALCIUM 600 PO) Take 600 mg by mouth 2 (two) times daily.    [provider]  carvedilol (COREG) 25 MG tablet Take 1 tablet (25 mg total) by mouth 2 (two) times daily with a meal. 05/09/19   Royal Hawthorn, NP  cholecalciferol (VITAMIN D) 25 MCG (1000 UNIT) tablet Take 1,000 Units by mouth daily.    [provider]  diltiazem (CARDIZEM SR) 120 MG 12 hr capsule Take 1 capsule (120 mg total) by mouth 2 (two) times daily. 05/09/19 02/05/21  Royal Hawthorn, NP  ferrous sulfate 325 (65 FE) MG tablet Take 1 tablet (325 mg total) by mouth every other day. Patient taking differently: Take 325 mg by mouth daily with breakfast. 12/20/18   Melene Plan, MD  glucose blood (ACCU-CHEK AVIVA PLUS) test strip Use as instructed 07/14/18   [name], [place], DO  Insulin Pen Needle (B-D ULTRAFINE III SHORT PEN) 31G X 8 MM MISC CHECK BLOOD SUGARS ONCE IN MORNING BEFORE FOOD AND ONCE IN THE EVENING. 07/14/18   Talbert Forest, [place], DO  Lancets (ACCU-CHEK SOFT TOUCH) lancets Use as instructed 07/14/18   [name], [place], DO  oxyCODONE-acetaminophen (PERCOCET/ROXICET) 5-325 MG tablet Take 1 tablet by mouth every 6 (six) hours as needed. 02/05/21   Lars Mage, PA-C     Vitals:   06/16/22 0530 06/16/22 0545 06/16/22 0600 06/16/22 0642  BP: (!) 170/77 (!) 172/71 (!) 180/70 (!) 167/67  Pulse: 66 66 67 70  Resp: 20 20 (!) 21 18  Temp:    98.1 F (36.7 C)  TempSrc:    Oral  SpO2: 99% 98% 98% 96%  Weight:    58.5 kg  Height:    5\' 6"  (1.676 m)   Exam Gen alert, no distress, mildly frail elderly female No rash, cyanosis or gangrene Sclera anicteric, throat clear  No jvd or bruits Chest clear bilat to bases, no rales/ wheezing RRR no MRG Abd soft ntnd no mass or ascites +bs GU defer MS no joint effusions or deformity Ext no LE or UE edema, no wounds or ulcers Neuro is alert, Ox 3 , nf    RUE AVG+bruit       Home meds include - lipitor, sensipar 180 qd, norvasc 10 qd, rocaltrol 0.25 mcg po tiw, coreg 25 bid, cardizem SR 120mg  bid, percocet prn, prns/ vits/ supps     OP HD: TTS G-O 3h   400/700   58.5kg   2/2 bath  RUE AVG   Hep 3000 - last HD 5/09, post wt 59.3kg, low wt gains coming in  at dry wt - last Hbs --> 9.0 on 4/25, 8.0 on 5/2, 7.6 on 5/9 - ferritin 700- 900 last 3 mos - mircera 120 mcg IV q 2 wks, last 5/02, due 5/16 - venofer 50mg  weekly IV, last 5/09   Assessment/ Plan: Acute blood loss anemia - symptomatic w/ Hb 6.5. SP 2u prbc's overnight. Stool + hemoccult. GI consulting per pmd notes. Per GI/ pmd.  UTI - started on IV rocephin ESRD - on HD TTS.  Missed HD last Saturday. Plan for HD today. HTN/ volume - BP's  are up a bit, cont coreg/ norvasc/ cardizem. At dry wt, lower if possible while here.  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  Will order darbe esa 100 mcg SQ weekly while here. SP 2u prbc's and Hb up > 8 MBD ckd - CCa in range, add on phos. Cont daily po vdra.  H/o CVA      Vinson Moselle  MD [facility] 06/16/2022, 8:22 AM  Recent Labs  Lab 06/15/22 2320 06/16/22 0525  HGB 6.5* 8.7*  ALBUMIN 3.1*  --   CALCIUM 9.6 9.7  CREATININE 7.79* 7.64*  K 3.5 3.6   Inpatient medications:  atorvastatin  80 mg Oral Daily   calcitRIOL  0.25 mcg Oral Daily   calcium carbonate  1 tablet Oral BID   carvedilol  25 mg Oral BID WC   cholecalciferol  1,000 Units Oral Daily   diltiazem  120 mg Oral BID   ferrous sulfate  325 mg Oral Q breakfast   pantoprazole (PROTONIX) IV  40 mg Intravenous Q12H    cefTRIAXone (ROCEPHIN)  IV     acetaminophen **OR** acetaminophen, labetalol, ondansetron **OR** ondansetron (ZOFRAN) IV, oxyCODONE-acetaminophen, traZODone

## 2022-06-16 NOTE — Progress Notes (Addendum)
Same day note  Sara Davis is a 72 y.o. female with medical history significant for type 2 diabetes mellitus, depression, hypertension, dyslipidemia and end-stage renal disease on hemodialysis on TTS, presented to hospital with fatigue generalized weakness with dark stools.  In the ED patient had low-grade fever and was slightly hypotensive with creatinine of 7.7.   High sensitive troponin I was 86 and later 83.  Lactic acid was 1.2 and later 1.1.  CBC showed hemoglobin of 6.5 and hematocrit 21.2 compared to 8.2 and 24 on 02/21/2021.  KG showed normal sinus rhythm.  Chest x-ray was negative for infiltrate.  In the ED patient received 2 units of packed RBC 40 mg of IV Protonix cefepime and vancomycin and was then considered for admission to the hospital for further evaluation and treatment.  Patient seen and examined at bedside.  Patient was admitted to the hospital for generalized weakness  At the time of my evaluation, patient complains of feeling okay.  Has not had a bowel movement since last Wednesday.  Physical examination reveals average built female, pallor  Laboratory data and imaging was reviewed  Assessment and Plan.  Acute blood loss anemia secondary to GI bleed. Initial hemoglobin of 6.5.  Status post 2 unit packed RBC transfusion.  GI has been consulted for FOBT positive and dark stools.  Continue IV Protonix.  Latest hemoglobin of 8.7.  Acute lower UTI Continue IV Rocephin.  Follow urine culture.  Urinalysis with significant abnormality.  Blood cultures negative in less than 12 hours.  No leukocytosis.   End-stage renal disease on hemodialysis [facility] Nephrology has been notified for resumption of hemodialysis.   Type 2 diabetes mellitus with chronic kidney disease, without long-term current use of insulin (HCC) Continue sliding scale insulin.   Dyslipidemia Continue statins   Essential hypertension Continue Cardizem.   Spoke with the patient's son on the phone and  updated him about the clinical condition of the patient.  No Charge  Signed,  Tenny Craw, MD [facility]

## 2022-06-16 NOTE — Consult Note (Addendum)
                                             Consultation Note   Referring Provider:  [facility] PCP: Nguyen, Kim, NP Primary Gastroenterologist: Unassigned        Reason for consultation: anemia, heme positive stool  DOA: 06/15/2022         Hospital Day: 2   Assessment and Plan   72 yo female with multiple co-morbidities including small bowel AVMs presenting with dark, heme positive stool and slight worsening of chronic anemia Presenting hgb 6.5 down from baseline of 7-8. Limited historian but sounds like stools have been loose since starting dialysis several months ago but they became dark only a few weeks ago. Takes oral iron. May have slow or intermittent bleeding from small bowel AVMs.   Chronically elevated alk phos.  Possibly related to CKD. No biliary duct dilation on CT scan in July 2023.   Cholelithiasis  UTI On Rocephin  History of reflux esophagitis in 2019.  No reflux symptoms. Not on acid blocker at home  History of colon polyps 1 to 2 small tubular adenomas in Dec 2019. A 5 year follow up colonoscopy was recommended. Based on updated guidelines this could be changed to a 7 year follow up  ESRD on HD TTS    History of Present Illness Patient is a 72 y.o. year old female whose past medical history includes but is not necessarily limited to DM2, HTN, HLD, ESRD on HD TTS, iron deficiency anemia, small bowel AVMs, adenomatous colon polyps, depression. She presented to hospital yesterday with fatigue and dark stools. Limited historian but says her stools have been loose since started dialysis ~ 6 months ago. A few weeks ago her stools turned dark in color. She takes iron but has been taking it several months without stool color changes. She doesn't take Pepto Bismol. No NSAID use. She has no N/V or abdominal pain.   Significant studies:  Normal WBC. Hgb 6.5. Alk phos 357, normal bilirubin. Normal AST/ ALT.  Hgb improved to 9.9 with 2 u PRBCs.   Labs and  Imaging: Recent Labs    06/15/22 2320 06/16/22 0525 06/16/22 1113  WBC 6.0 6.9  --   HGB 6.5* 8.7* 9.9*  HCT 21.2* 27.1* 30.5*  PLT 260 234  --    Recent Labs    06/15/22 2320 06/16/22 0525  NA 137 136  K 3.5 3.6  CL 105 103  CO2 19* 17*  GLUCOSE 108* 102*  BUN 53* 57*  CREATININE 7.79* 7.64*  CALCIUM 9.6 9.7   Recent Labs    06/15/22 2320  PROT 5.6*  ALBUMIN 3.1*  AST 11*  ALT 8  ALKPHOS 357*  BILITOT 0.5   Recent Labs    06/16/22 1113  HEPBSAG NON REACTIVE   Recent Labs    06/15/22 2320  LABPROT 14.7  INR 1.1    Previous GI Evaluation:   01/07/2018 EGD for iron deficiency anemia Dr. Emaleigh Guimond: Reflux esophagitis, nonbleeding gastric ulcer with no stigmata of bleeding, normal stomach otherwise, few nonbleeding angiodysplastic lesions in the duodenum; CLO test + h.pylori 01/07/2018 colonoscopy for iron deficiency anemia Dr. Valaree Fresquez: 3 4-8 mm polyps in sigmoid colon and in the transverse colon, otherwise normal, pathology was a mix of hyperplastic and tubular adenomatous recall recommended in 5 years based on   surveillance guidelines at that time. Colon, polyp(s), transverse and sigmoid - TUBULAR ADENOMA(S). - HYPERPLASTIC POLYP(S). - HIGH GRADE DYSPLASIA IS NOT IDENTIFIED   Principal Problem:   Acute blood loss anemia Active Problems:   Essential hypertension   Acute lower UTI   End-stage renal disease on hemodialysis (HCC)   Dyslipidemia   Type 2 diabetes mellitus with chronic kidney disease, without long-term current use of insulin (HCC)     Past Medical History:  Diagnosis Date   Anemia    Cellulitis of scalp 02/2015   Chronic kidney disease 01/20/2021   stage V   Depression    Diabetes mellitus without complication (HCC)    History of blood transfusion    Hyperlipidemia    Hypertension     Past Surgical History:  Procedure Laterality Date   ABDOMINAL HYSTERECTOMY     AV FISTULA PLACEMENT Right 02/05/2021   Procedure: INSERTION OF RIGHT ARM  ARTERIOVENOUS (AV) GORE-TEX GRAFT;  Surgeon: Hawken, Thomas N, MD;  Location: [facility] OR;  Service: Vascular;  Laterality: Right;   BIOPSY  01/07/2018   Procedure: BIOPSY;  Surgeon: Daviana Haymaker N, MD;  Location: [facility] ENDOSCOPY;  Service: Endoscopy;;  Clotest   COLONOSCOPY WITH PROPOFOL N/A 01/07/2018   Procedure: COLONOSCOPY WITH PROPOFOL;  Surgeon: Hyder Deman N, MD;  Location: [facility] ENDOSCOPY;  Service: Endoscopy;  Laterality: N/A;   ESOPHAGOGASTRODUODENOSCOPY (EGD) WITH PROPOFOL N/A 01/07/2018   Procedure: ESOPHAGOGASTRODUODENOSCOPY (EGD) WITH PROPOFOL;  Surgeon: Park Beck N, MD;  Location: [facility] ENDOSCOPY;  Service: Endoscopy;  Laterality: N/A;   ESOPHAGOGASTRODUODENOSCOPY (EGD) WITH PROPOFOL N/A 12/14/2018   Procedure: ESOPHAGOGASTRODUODENOSCOPY (EGD) WITH PROPOFOL;  Surgeon: Joshia Kitchings N, MD;  Location: [facility] ENDOSCOPY;  Service: Endoscopy;  Laterality: N/A;   HEMOSTASIS CONTROL  12/14/2018   Procedure: HEMOSTASIS CONTROL;  Surgeon: Benjimin Hadden N, MD;  Location: [facility] ENDOSCOPY;  Service: Endoscopy;;   POLYPECTOMY  01/07/2018   Procedure: POLYPECTOMY;  Surgeon: Shawna Wearing N, MD;  Location: [facility] ENDOSCOPY;  Service: Endoscopy;;    Family History  Problem Relation Age of Onset   Diabetes Mother    Cancer Mother    Diabetes Sister    Hypertension Sister    Cancer Sister 56       Bone   Diabetes Brother    Diabetes Sister    Diabetes Sister     Prior to Admission medications   Medication Sig Start Date End Date Taking? Authorizing Provider  amLODipine (NORVASC) 10 MG tablet Take 10 mg by mouth daily. 01/30/22  Yes [provider]  atorvastatin (LIPITOR) 80 MG tablet Take 1 tablet (80 mg total) by mouth daily. 12/20/18  Yes Kim, Rachel E, MD  AURYXIA 1 GM 210 MG(Fe) tablet Take 420 mg by mouth 3 (three) times daily. 04/12/22  Yes [provider]  B Complex-C-Folic Acid (DIALYVITE TABLET) TABS Take 1 tablet by mouth daily. 03/05/22  Yes [provider]  carvedilol (COREG) 25 MG  tablet Take 1 tablet (25 mg total) by mouth 2 (two) times daily with a meal. 05/09/19  Yes Byrd, Sarah Arnette, NP  cinacalcet (SENSIPAR) 60 MG tablet Take 180 mg by mouth daily. 04/21/22  Yes [provider]  Alcohol Swabs (B-D SINGLE USE SWABS REGULAR) PADS 1 Units by Does not apply route 3 (three) times daily as needed. 07/14/18   [name], Jordan, DO  calcitRIOL (ROCALTROL) 0.25 MCG capsule Take 0.25 mcg by mouth daily. Patient not taking: Reported on 06/16/2022 01/29/21   [provider]  Calcium Carbonate (  CALCIUM 600 PO) Take 600 mg by mouth 2 (two) times daily. Patient not taking: Reported on 06/16/2022    [provider]  cholecalciferol (VITAMIN D) 25 MCG (1000 UNIT) tablet Take 1,000 Units by mouth daily. Patient not taking: Reported on 06/16/2022    [provider]  diltiazem (CARDIZEM SR) 120 MG 12 hr capsule Take 1 capsule (120 mg total) by mouth 2 (two) times daily. Patient not taking: Reported on 06/16/2022 05/09/19 02/05/21  Byrd, Sarah Arnette, NP  ferrous sulfate 325 (65 FE) MG tablet Take 1 tablet (325 mg total) by mouth every other day. Patient not taking: Reported on 06/16/2022 12/20/18   Kim, Rachel E, MD  glucose blood (ACCU-CHEK AVIVA PLUS) test strip Use as instructed 07/14/18   [name], Jordan, DO  Lancets (ACCU-CHEK SOFT TOUCH) lancets Use as instructed 07/14/18   [name], Jordan, DO  oxyCODONE-acetaminophen (PERCOCET/ROXICET) 5-325 MG tablet Take 1 tablet by mouth every 6 (six) hours as needed. Patient not taking: Reported on 06/16/2022 02/05/21   Collins, Emma M, PA-C    Current Facility-Administered Medications  Medication Dose Route Frequency Provider Last Rate Last Admin   acetaminophen (TYLENOL) tablet 650 mg  650 mg Oral Q6H PRN Mansy, Jan A, MD       Or   acetaminophen (TYLENOL) suppository 650 mg  650 mg Rectal Q6H PRN Mansy, Jan A, MD       atorvastatin (LIPITOR) tablet 80 mg  80 mg Oral Daily Mansy, Jan A, MD       calcitRIOL  (ROCALTROL) capsule 0.25 mcg  0.25 mcg Oral Daily Mansy, Jan A, MD       calcium carbonate (OS-CAL - dosed in mg of elemental calcium) tablet 1,250 mg  1 tablet Oral BID Mansy, Jan A, MD   1,250 mg at 06/16/22 0901   carvedilol (COREG) tablet 25 mg  25 mg Oral BID WC Mansy, Jan A, MD       cefTRIAXone (ROCEPHIN) 1 g in sodium chloride 0.9 % 100 mL IVPB  1 g Intravenous Q24H Mansy, Jan A, MD       Chlorhexidine Gluconate Cloth 2 % PADS 6 each  6 each Topical Q0600 Schertz, Robert, MD       cholecalciferol (VITAMIN D3) 25 MCG (1000 UNIT) tablet 1,000 Units  1,000 Units Oral Daily Mansy, Jan A, MD       diltiazem (CARDIZEM SR) 12 hr capsule 120 mg  120 mg Oral BID Mansy, Jan A, MD       ferrous sulfate tablet 325 mg  325 mg Oral Q breakfast Mansy, Jan A, MD   325 mg at 06/16/22 0900   labetalol (NORMODYNE) injection 20 mg  20 mg Intravenous Q3H PRN Mansy, Jan A, MD   20 mg at 06/16/22 0455   ondansetron (ZOFRAN) tablet 4 mg  4 mg Oral Q6H PRN Mansy, Jan A, MD       Or   ondansetron (ZOFRAN) injection 4 mg  4 mg Intravenous Q6H PRN Mansy, Jan A, MD       oxyCODONE-acetaminophen (PERCOCET/ROXICET) 5-325 MG per tablet 1 tablet  1 tablet Oral Q6H PRN Mansy, Jan A, MD       pantoprazole (PROTONIX) injection 40 mg  40 mg Intravenous Q12H Mansy, Jan A, MD   40 mg at 06/16/22 0901   traZODone (DESYREL) tablet 25 mg  25 mg Oral QHS PRN Mansy, Jan A, MD        Allergies as of 06/15/2022 - Review   Complete 06/15/2022  Allergen Reaction Noted   Aspirin Other (See Comments) 01/14/2012    Social History   Socioeconomic History   Marital status: Single    Spouse name: Not on file   Number of children: 2   Years of education: Not on file   Highest education level: Not on file  Occupational History   Occupation: Retired  Tobacco Use   Smoking status: Every Day    Packs/day: 0.50    Years: 53.00    Additional pack years: 0.00    Total pack years: 26.50    Types: Cigarettes   Smokeless tobacco: Never   Vaping Use   Vaping Use: Never used  Substance and Sexual Activity   Alcohol use: No    Alcohol/week: 0.0 standard drinks of alcohol   Drug use: No   Sexual activity: Not on file  Other Topics Concern   Not on file  Social History Narrative   Not on file   Social Determinants of Health   Financial Resource Strain: Not on file  Food Insecurity: Not on file  Transportation Needs: Not on file  Physical Activity: Not on file  Stress: Not on file  Social Connections: Not on file  Intimate Partner Violence: Not on file    Code Status   Code Status: Full Code  Review of Systems: All systems reviewed and negative except where noted in HPI.  Physical Exam: Vital signs in last 24 hours: Temp:  [98 F (36.7 C)-100.3 F (37.9 C)] 98.6 F (37 C) (05/14 0950) Pulse Rate:  [66-81] 70 (05/14 0950) Resp:  [11-27] 18 (05/14 0950) BP: (149-198)/(64-123) 198/69 (05/14 0950) SpO2:  [96 %-100 %] 98 % (05/14 0950) Weight:  [58.5 kg-59 kg] 58.5 kg (05/14 0642) Last BM Date : 06/10/22  General:  Pleasant thin female in NAD Psych:  Cooperative. Normal mood and affect Eyes: Pupils equal Ears:  Normal auditory acuity Nose: No deformity, discharge or lesions Neck:  Supple, no masses felt Lungs:  Clear to auscultation.  Heart:  Regular rate, regular rhythm.  Abdomen:  Soft, nondistended, nontender, active bowel sounds, no masses felt Rectal :  Deferred Msk: Symmetrical without gross deformities.  Neurologic:  Alert, oriented, grossly normal neurologically Extremities : No edema Skin:  Intact without significant lesions.    Intake/Output from previous day: 05/13 0701 - 05/14 0700 In: 450 [I.V.:100; IV Piggyback:350] Out: -  Intake/Output this shift:  No intake/output data recorded.  Paula Guenther, NP-C @  06/16/2022, 12:46 PM  GI ATTENDING  History, laboratories, x-rays, prior endoscopy reports reviewed.  Patient seen and examined.  Familiar to me from previous  hospitalizations.  Has recurrent iron deficiency anemia for which she is undergone GI workups.  Presents now with worsening chronic anemia and reports of dark stools.  Stool is heme positive.  On iron.  Her anemia is multifactorial.  Does have a history of nonbleeding gastric ulcer as well as AVMs.  Clinically stable.  Recommend transfusing 2 clinically appropriate hemoglobin level.  Would recommend upper endoscopy/possible enteroscopy this admission.  Possibly tomorrow.  Discussed with patient.The nature of the procedure, as well as the risks, benefits, and alternatives were carefully and thoroughly reviewed with the patient. Ample time for discussion and questions allowed. The patient understood, was satisfied, and agreed to proceed.  She is higher than baseline risk given her comorbidities.  Doni Widmer N. Stormey Wilborn, Jr., M.D. [facility] G[facility]enter[facility]   

## 2022-06-16 NOTE — Assessment & Plan Note (Addendum)
-   The patient will admitted to a medical telemetry bed. - She was typed and crossmatched and will be transfused 2 units of packed red blood cells.  Will follow posttransfusion H&H as well as serial levels. - This is likely associated with GI bleeding giving her positive stool Hemoccult. - GI consult will be obtained. - I notified Willette Cluster NP on-call for [facility]. - We will continue the patient on IV Protonix 40 mg every 12 hours for now.

## 2022-06-16 NOTE — Assessment & Plan Note (Signed)
--   She will be placed on supplemental coverage with Novolog SQ.

## 2022-06-16 NOTE — ED Notes (Signed)
ED TO INPATIENT HANDOFF REPORT  ED Nurse Name and Phone #: Grant Fontana PM [phone]   S Name/Age/Gender Sara Davis 72 y.o. female Room/Bed: 033C/033C  Code Status   Code Status: Full Code  Home/SNF/Other Home Patient oriented to: self, place, time, and situation Is this baseline? Yes   Triage Complete: Triage complete  Chief Complaint Acute blood loss anemia [D62]  Triage Note No notes on file   Allergies Allergies  Allergen Reactions   Aspirin Other (See Comments)    Upset stomach, can only take coated aspirin but prefers to not take it at all    Level of Care/Admitting Diagnosis ED Disposition     ED Disposition  Admit   Condition  --   Comment  Hospital Area: [facility] [[id number]]  Level of Care: Telemetry Medical [104]  May admit patient to [facility] or [facility] if equivalent level of care is available:: No  Covid Evaluation: Asymptomatic - no recent exposure (last 10 days) testing not required  Diagnosis: Acute blood loss anemia [454098]  Admitting Physician: Hannah Beat [[id number]]  Attending Physician: Hannah Beat [[id number]]  Certification:: I certify this patient will need inpatient services for at least 2 midnights  Estimated Length of Stay: 2          B Medical/Surgery History Past Medical History:  Diagnosis Date   Anemia    Cellulitis of scalp 02/2015   Chronic kidney disease 01/20/2021   stage V   Depression    Diabetes mellitus without complication (HCC)    History of blood transfusion    Hyperlipidemia    Hypertension    Past Surgical History:  Procedure Laterality Date   ABDOMINAL HYSTERECTOMY     AV FISTULA PLACEMENT Right 02/05/2021   Procedure: INSERTION OF RIGHT ARM ARTERIOVENOUS (AV) GORE-TEX GRAFT;  Surgeon: Leonie Douglas, MD;  Location: [facility] OR;  Service: Vascular;  Laterality: Right;   BIOPSY  01/07/2018   Procedure: BIOPSY;  Surgeon: Hilarie Fredrickson, MD;  Location: [facility] ENDOSCOPY;  Service:  Endoscopy;;  Clotest   COLONOSCOPY WITH PROPOFOL N/A 01/07/2018   Procedure: COLONOSCOPY WITH PROPOFOL;  Surgeon: Hilarie Fredrickson, MD;  Location: [facility] ENDOSCOPY;  Service: Endoscopy;  Laterality: N/A;   ESOPHAGOGASTRODUODENOSCOPY (EGD) WITH PROPOFOL N/A 01/07/2018   Procedure: ESOPHAGOGASTRODUODENOSCOPY (EGD) WITH PROPOFOL;  Surgeon: Hilarie Fredrickson, MD;  Location: [facility] ENDOSCOPY;  Service: Endoscopy;  Laterality: N/A;   ESOPHAGOGASTRODUODENOSCOPY (EGD) WITH PROPOFOL N/A 12/14/2018   Procedure: ESOPHAGOGASTRODUODENOSCOPY (EGD) WITH PROPOFOL;  Surgeon: Hilarie Fredrickson, MD;  Location: [facility] ENDOSCOPY;  Service: Endoscopy;  Laterality: N/A;   HEMOSTASIS CONTROL  12/14/2018   Procedure: HEMOSTASIS CONTROL;  Surgeon: Hilarie Fredrickson, MD;  Location: [facility] ENDOSCOPY;  Service: Endoscopy;;   POLYPECTOMY  01/07/2018   Procedure: POLYPECTOMY;  Surgeon: Hilarie Fredrickson, MD;  Location: [facility] ENDOSCOPY;  Service: Endoscopy;;     A IV Location/Drains/Wounds Patient Lines/Drains/Airways Status     Active Line/Drains/Airways     Name Placement date Placement time Site Days   Peripheral IV 06/15/22 20 G 1" Left;Posterior Hand 06/15/22  2320  Hand  1   Peripheral IV 06/16/22 18 G 1.16" Left Antecubital 06/16/22  0114  Antecubital  less than 1   Fistula / Graft Right Upper arm Arteriovenous vein graft 02/05/21  1441  Upper arm  496   Incision (Closed) 02/05/21 Arm Right 02/05/21  1444  -- 496  Intake/Output Last 24 hours  Intake/Output Summary (Last 24 hours) at 06/16/2022 0514 Last data filed at 06/16/2022 0414 Gross per 24 hour  Intake 450 ml  Output --  Net 450 ml    Labs/Imaging Results for orders placed or performed during the hospital encounter of 06/15/22 (from the past 48 hour(s))  Type and screen [facility]     Status: None (Preliminary result)   Collection Time: 06/15/22 11:15 PM  Result Value Ref Range   ABO/RH(D) O POS    Antibody Screen NEG    Sample Expiration  06/18/2022,2359    Unit Number [id number]    Blood Component Type RED CELLS,LR    Unit division 00    Status of Unit ISSUED    Transfusion Status OK TO TRANSFUSE    Crossmatch Result      Compatible Performed at [facility], [address] [place], [place] [address]    Unit Number [id number]    Blood Component Type RED CELLS,LR    Unit division 00    Status of Unit ISSUED    Transfusion Status OK TO TRANSFUSE    Crossmatch Result Compatible   Lactic acid, plasma     Status: None   Collection Time: 06/15/22 11:20 PM  Result Value Ref Range   Lactic Acid, Venous 1.2 0.5 - 1.9 mmol/L    Comment: Performed at [facility], [address] [place], [place] [address]  Comprehensive metabolic panel     Status: Abnormal   Collection Time: 06/15/22 11:20 PM  Result Value Ref Range   Sodium 137 135 - 145 mmol/L   Potassium 3.5 3.5 - 5.1 mmol/L   Chloride 105 98 - 111 mmol/L   CO2 19 (L) 22 - 32 mmol/L   Glucose, Bld 108 (H) 70 - 99 mg/dL    Comment: Glucose reference range applies only to samples taken after fasting for at least 8 hours.   BUN 53 (H) 8 - 23 mg/dL   Creatinine, Ser 6.29 (H) 0.44 - 1.00 mg/dL   Calcium 9.6 8.9 - 52.8 mg/dL   Total Protein 5.6 (L) 6.5 - 8.1 g/dL   Albumin 3.1 (L) 3.5 - 5.0 g/dL   AST 11 (L) 15 - 41 U/L   ALT 8 0 - 44 U/L   Alkaline Phosphatase 357 (H) 38 - 126 U/L   Total Bilirubin 0.5 0.3 - 1.2 mg/dL   GFR, Estimated 5 (L) >60 mL/min    Comment: (NOTE) Calculated using the CKD-EPI Creatinine Equation (2021)    Anion gap 13 5 - 15    Comment: Performed at [facility] Lab, [address], [place], [place] [address]  CBC with Differential     Status: Abnormal   Collection Time: 06/15/22 11:20 PM  Result Value Ref Range   WBC 6.0 4.0 - 10.5 K/uL   RBC 2.24 (L) 3.87 - 5.11 MIL/uL   Hemoglobin 6.5 (LL) 12.0 - 15.0 g/dL    Comment: REPEATED TO VERIFY THIS CRITICAL RESULT HAS VERIFIED AND BEEN CALLED TO KURT Trannie Bardales RN BY SHERRY  GALLOWAY ON 05 14 2024 AT 0023, AND HAS BEEN READ BACK.     HCT 21.2 (L) 36.0 - 46.0 %   MCV 94.6 80.0 - 100.0 fL   MCH 29.0 26.0 - 34.0 pg   MCHC 30.7 30.0 - 36.0 g/dL   RDW 40.1 02.7 - 25.3 %   Platelets 260 150 - 400 K/uL   nRBC 0.0  0.0 - 0.2 %   Neutrophils Relative % 61 %   Neutro Abs 3.7 1.7 - 7.7 K/uL   Lymphocytes Relative 29 %   Lymphs Abs 1.7 0.7 - 4.0 K/uL   Monocytes Relative 9 %   Monocytes Absolute 0.5 0.1 - 1.0 K/uL   Eosinophils Relative 1 %   Eosinophils Absolute 0.1 0.0 - 0.5 K/uL   Basophils Relative 0 %   Basophils Absolute 0.0 0.0 - 0.1 K/uL   Immature Granulocytes 0 %   Abs Immature Granulocytes 0.02 0.00 - 0.07 K/uL    Comment: Performed at [facility], [address], [place], [place] [address]  Protime-INR     Status: None   Collection Time: 06/15/22 11:20 PM  Result Value Ref Range   Prothrombin Time 14.7 11.4 - 15.2 seconds   INR 1.1 0.8 - 1.2    Comment: (NOTE) INR goal varies based on device and disease states. Performed at [facility], [address] [place], [place] [address]   Resp panel by RT-PCR (RSV, Flu A&B, Covid) Anterior Nasal Swab     Status: None   Collection Time: 06/15/22 11:20 PM   Specimen: Anterior Nasal Swab  Result Value Ref Range   SARS Coronavirus 2 by RT PCR NEGATIVE NEGATIVE   Influenza A by PCR NEGATIVE NEGATIVE   Influenza B by PCR NEGATIVE NEGATIVE    Comment: (NOTE) The Xpert Xpress SARS-CoV-2/FLU/RSV plus assay is intended as an aid in the diagnosis of influenza from Nasopharyngeal swab specimens and should not be used as a sole basis for treatment. Nasal washings and aspirates are unacceptable for Xpert Xpress SARS-CoV-2/FLU/RSV testing.  Fact Sheet for Patients: BloggerCourse.com  Fact Sheet for Healthcare Providers: SeriousBroker.it  This test is not yet approved or cleared by the [place] FDA and has been authorized for detection and/or  diagnosis of SARS-CoV-2 by FDA under an Emergency Use Authorization (EUA). This EUA will remain in effect (meaning this test can be used) for the duration of the COVID-19 declaration under Section 564(b)(1) of the Act, 21 U.S.C. section 360bbb-3(b)(1), unless the authorization is terminated or revoked.     Resp Syncytial Virus by PCR NEGATIVE NEGATIVE    Comment: (NOTE) Fact Sheet for Patients: BloggerCourse.com  Fact Sheet for Healthcare Providers: SeriousBroker.it  This test is not yet approved or cleared by the [place] FDA and has been authorized for detection and/or diagnosis of SARS-CoV-2 by FDA under an Emergency Use Authorization (EUA). This EUA will remain in effect (meaning this test can be used) for the duration of the COVID-19 declaration under Section 564(b)(1) of the Act, 21 U.S.C. section 360bbb-3(b)(1), unless the authorization is terminated or revoked.  Performed at [facility], [address], [place], [place] [address]   Troponin I (High Sensitivity)     Status: Abnormal   Collection Time: 06/15/22 11:20 PM  Result Value Ref Range   Troponin I (High Sensitivity) 86 (H) <18 ng/L    Comment: (NOTE) Elevated high sensitivity troponin I (hsTnI) values and significant  changes across serial measurements may suggest ACS but many other  chronic and acute conditions are known to elevate hsTnI results.  Refer to the "Links" section for chest pain algorithms and additional  guidance. Performed at [facility], [address] [place], [place] [address]   Lactic acid, plasma     Status: None   Collection Time: 06/16/22 12:38 AM  Result Value Ref Range  Lactic Acid, Venous 1.1 0.5 - 1.9 mmol/L    Comment: Performed at [facility], [address], [place], [place] [address]  Troponin I (High Sensitivity)     Status: Abnormal   Collection Time: 06/16/22 12:39 AM  Result Value Ref Range    Troponin I (High Sensitivity) 83 (H) <18 ng/L    Comment: (NOTE) Elevated high sensitivity troponin I (hsTnI) values and significant  changes across serial measurements may suggest ACS but many other  chronic and acute conditions are known to elevate hsTnI results.  Refer to the "Links" section for chest pain algorithms and additional  guidance. Performed at [facility], [address] [place], [place] [address]   Prepare RBC (crossmatch)     Status: None   Collection Time: 06/16/22 12:54 AM  Result Value Ref Range   Order Confirmation      ORDER PROCESSED BY BLOOD BANK Performed at [facility], [address], [place], [place] [address]   POC occult blood, ED     Status: Abnormal   Collection Time: 06/16/22  1:03 AM  Result Value Ref Range   Fecal Occult Bld POSITIVE (A) NEGATIVE  Urinalysis, Routine w reflex microscopic -Urine, Clean Catch     Status: Abnormal   Collection Time: 06/16/22  1:09 AM  Result Value Ref Range   Color, Urine YELLOW YELLOW   APPearance CLOUDY (A) CLEAR   Specific Gravity, Urine 1.008 1.005 - 1.030   pH 8.0 5.0 - 8.0   Glucose, UA >=500 (A) NEGATIVE mg/dL   Hgb urine dipstick MODERATE (A) NEGATIVE   Bilirubin Urine NEGATIVE NEGATIVE   Ketones, ur 5 (A) NEGATIVE mg/dL   Protein, ur >=914 (A) NEGATIVE mg/dL   Nitrite NEGATIVE NEGATIVE   Leukocytes,Ua LARGE (A) NEGATIVE   RBC / HPF 6-10 0 - 5 RBC/hpf   WBC, UA >50 0 - 5 WBC/hpf   Bacteria, UA MANY (A) NONE SEEN   Squamous Epithelial / HPF 6-10 0 - 5 /HPF   WBC Clumps PRESENT     Comment: Performed at [facility] Lab, [address], [place], [place] [address]   DG Chest 2 View  Result Date: 06/15/2022 CLINICAL DATA:  Possible sepsis EXAM: CHEST - 2 VIEW COMPARISON:  12/13/2018 FINDINGS: Cardiac shadow is within normal limits. Lungs are well aerated bilaterally. No focal infiltrate or effusion is seen. No bony abnormality is noted. IMPRESSION: No active cardiopulmonary disease.  Electronically Signed   By: Alcide Clever M.D.   On: 06/15/2022 23:45    Pending Labs Unresulted Labs (From admission, onward)     Start     Ordered   06/16/22 0500  Basic metabolic panel  Tomorrow morning,   R        06/16/22 0458   06/16/22 0500  CBC  Tomorrow morning,   R        06/16/22 0458   06/16/22 0458  Hemoglobin and hematocrit, blood  Now then every 6 hours,   R      06/16/22 0458   06/16/22 0455  HIV Antibody (routine testing w rflx)  (HIV Antibody (Routine testing w reflex) panel)  Once,   R        06/16/22 0458   06/15/22 2253  Blood Culture (routine x 2)  (Undifferentiated presentation (screening labs and basic nursing orders))  BLOOD CULTURE X 2,   STAT      06/15/22 2253   06/15/22 2253  Urine  Culture (for pregnant, neutropenic or urologic patients or patients with an indwelling urinary catheter)  (Undifferentiated presentation (screening labs and basic nursing orders))  Once,   URGENT       Question:  Indication  Answer:  Sepsis   06/15/22 2253            Vitals/Pain Today's Vitals   06/16/22 0428 06/16/22 0430 06/16/22 0445 06/16/22 0452  BP: (!) 169/95 (!) 179/66 (!) 184/73 (!) 184/73  Pulse: 70 70 74 74  Resp: 17 15 (!) 21 (!) 21  Temp: 98.2 F (36.8 C)   98 F (36.7 C)  TempSrc: Oral   Oral  SpO2: 99% 100% 98% 98%  Weight:      Height:      PainSc:        Isolation Precautions Airborne and Contact precautions  Medications Medications  labetalol (NORMODYNE) injection 20 mg (20 mg Intravenous Given 06/16/22 0455)  oxyCODONE-acetaminophen (PERCOCET/ROXICET) 5-325 MG per tablet 1 tablet (has no administration in time range)  atorvastatin (LIPITOR) tablet 80 mg (has no administration in time range)  carvedilol (COREG) tablet 25 mg (has no administration in time range)  diltiazem (CARDIZEM SR) 12 hr capsule 120 mg (has no administration in time range)  calcitRIOL (ROCALTROL) capsule 0.25 mcg (has no administration in time range)  ferrous sulfate  tablet 325 mg (has no administration in time range)  calcium carbonate (OS-CAL) tablet 600 mg (has no administration in time range)  cholecalciferol (VITAMIN D3) 25 MCG (1000 UNIT) tablet 1,000 Units (has no administration in time range)  enoxaparin (LOVENOX) injection 40 mg (has no administration in time range)  acetaminophen (TYLENOL) tablet 650 mg (has no administration in time range)    Or  acetaminophen (TYLENOL) suppository 650 mg (has no administration in time range)  traZODone (DESYREL) tablet 25 mg (has no administration in time range)  ondansetron (ZOFRAN) tablet 4 mg (has no administration in time range)    Or  ondansetron (ZOFRAN) injection 4 mg (has no administration in time range)  pantoprazole (PROTONIX) injection 40 mg (has no administration in time range)  cefTRIAXone (ROCEPHIN) 1 g in sodium chloride 0.9 % 100 mL IVPB (has no administration in time range)  acetaminophen (TYLENOL) tablet 650 mg (650 mg Oral Given 06/15/22 2351)  ceFEPIme (MAXIPIME) 1 g in sodium chloride 0.9 % 100 mL IVPB (0 g Intravenous Stopped 06/16/22 0115)  vancomycin (VANCOREADY) IVPB 1250 mg/250 mL (0 mg Intravenous Stopped 06/16/22 0208)  0.9 %  sodium chloride infusion (Manually program via Guardrails IV Fluids) ( Intravenous New Bag/Given 06/16/22 0122)  pantoprazole (PROTONIX) injection 40 mg (40 mg Intravenous Given 06/16/22 0117)    Mobility walks     Focused Assessments Renal Assessment Handoff:  Hemodialysis Schedule: Hemodialysis Schedule: Tuesday/Thursday/Saturday Last Hemodialysis date and time: Thursday 5/9   Restricted appendage: right arm   R Recommendations: See Admitting Provider Note  Report given to:   Additional Notes:

## 2022-06-16 NOTE — Assessment & Plan Note (Signed)
-   We will continue her antihypertensives. 

## 2022-06-16 NOTE — H&P (Signed)
[place]   PATIENT NAME: Sara Davis    MR#:  [id number]  DATE OF BIRTH:  [birth year]  DATE OF ADMISSION:  06/15/2022  PRIMARY CARE PHYSICIAN: Loura Back, NP   Patient is coming from: Home  REQUESTING/REFERRING PHYSICIAN: Rancour, Jeannett Senior, MD  CHIEF COMPLAINT:   Chief Complaint  Patient presents with   Weakness    HISTORY OF PRESENT ILLNESS:  Sara Davis is a 72 y.o. female with medical history significant for type 2 diabetes mellitus, depression, hypertension, dyslipidemia and end-stage renal disease on hemodialysis on TTS, who presented to the emergency room with acute onset of fatigue and tiredness and not feeling good.  She stated that she noted dark stools but denies any black tarry stools or bloody bowel movements.  She denies any nausea or vomiting or heartburn.  She makes a small amount of urine.  She denies any dysuria or hematuria, urinary frequency or urgency or flank pain.  No cough or wheezing or hemoptysis.  No other bleeding diathesis.  She denies any chest pain or palpitations.  She denies any fever or chills at home.  ED Course: When she came to the ER BP was 189/73 with temperature 100.3 otherwise normal vital signs.  Labs revealed a CO2 of 19 with a BUN of 53 and creatinine 7.79 with alk phos of 357 and albumin of 3.1 and total protein of 5.6.  High sensitive troponin I was 86 and later 83.  Lactic acid was 1.2 and later 1.1.  CBC showed hemoglobin of 6.5 and hematocrit 21.2 compared to 8.2 and 24 on 02/21/2021. EKG as reviewed by me : Normal sinus rhythm with a rate of 74 with probable atrial enlargement. Imaging: Two-view chest x-ray showed no acute cardiopulmonary disease.  The patient was typed and crossmatched ordered 2 units of packed red blood cells.  She was also ordered 40 mg of IV Protonix as well as IV cefepime and vancomycin and 650 mg of p.o. Tylenol.  She will be admitted to a medical telemetry bed for further evaluation and management. PAST  MEDICAL HISTORY:   Past Medical History:  Diagnosis Date   Anemia    Cellulitis of scalp 02/2015   Chronic kidney disease 01/20/2021   stage V   Depression    Diabetes mellitus without complication (HCC)    History of blood transfusion    Hyperlipidemia    Hypertension     PAST SURGICAL HISTORY:   Past Surgical History:  Procedure Laterality Date   ABDOMINAL HYSTERECTOMY     AV FISTULA PLACEMENT Right 02/05/2021   Procedure: INSERTION OF RIGHT ARM ARTERIOVENOUS (AV) GORE-TEX GRAFT;  Surgeon: Leonie Douglas, MD;  Location: [facility] OR;  Service: Vascular;  Laterality: Right;   BIOPSY  01/07/2018   Procedure: BIOPSY;  Surgeon: Hilarie Fredrickson, MD;  Location: [facility] ENDOSCOPY;  Service: Endoscopy;;  Clotest   COLONOSCOPY WITH PROPOFOL N/A 01/07/2018   Procedure: COLONOSCOPY WITH PROPOFOL;  Surgeon: Hilarie Fredrickson, MD;  Location: [facility] ENDOSCOPY;  Service: Endoscopy;  Laterality: N/A;   ESOPHAGOGASTRODUODENOSCOPY (EGD) WITH PROPOFOL N/A 01/07/2018   Procedure: ESOPHAGOGASTRODUODENOSCOPY (EGD) WITH PROPOFOL;  Surgeon: Hilarie Fredrickson, MD;  Location: [facility] ENDOSCOPY;  Service: Endoscopy;  Laterality: N/A;   ESOPHAGOGASTRODUODENOSCOPY (EGD) WITH PROPOFOL N/A 12/14/2018   Procedure: ESOPHAGOGASTRODUODENOSCOPY (EGD) WITH PROPOFOL;  Surgeon: Hilarie Fredrickson, MD;  Location: [facility] ENDOSCOPY;  Service: Endoscopy;  Laterality: N/A;   HEMOSTASIS CONTROL  12/14/2018   Procedure: HEMOSTASIS CONTROL;  Surgeon: Marina Goodell,  Wilhemina Bonito, MD;  Location: [facility] ENDOSCOPY;  Service: Endoscopy;;   POLYPECTOMY  01/07/2018   Procedure: POLYPECTOMY;  Surgeon: Hilarie Fredrickson, MD;  Location: [facility] ENDOSCOPY;  Service: Endoscopy;;    SOCIAL HISTORY:   Social History   Tobacco Use   Smoking status: Every Day    Packs/day: 0.50    Years: 53.00    Additional pack years: 0.00    Total pack years: 26.50    Types: Cigarettes   Smokeless tobacco: Never  Substance Use Topics   Alcohol use: No    Alcohol/week: 0.0 standard drinks of alcohol    FAMILY  HISTORY:   Family History  Problem Relation Age of Onset   Diabetes Mother    Cancer Mother    Diabetes Sister    Hypertension Sister    Cancer Sister 65       Bone   Diabetes Brother    Diabetes Sister    Diabetes Sister     DRUG ALLERGIES:   Allergies  Allergen Reactions   Aspirin Other (See Comments)    Upset stomach, can only take coated aspirin but prefers to not take it at all    REVIEW OF SYSTEMS:   ROS As per history of present illness. All pertinent systems were reviewed above. Constitutional, HEENT, cardiovascular, respiratory, GI, GU, musculoskeletal, neuro, psychiatric, endocrine, integumentary and hematologic systems were reviewed and are otherwise negative/unremarkable except for positive findings mentioned above in the HPI.   MEDICATIONS AT HOME:   Prior to Admission medications   Medication Sig Start Date End Date Taking? Authorizing Provider  atorvastatin (LIPITOR) 80 MG tablet Take 1 tablet (80 mg total) by mouth daily. Patient taking differently: Take 80 mg by mouth daily. hs 12/20/18  Yes Melene Plan, MD  Alcohol Swabs (B-D SINGLE USE SWABS REGULAR) PADS 1 Units by Does not apply route 3 (three) times daily as needed. 07/14/18   [name], [place], DO  Blood Glucose Calibration (ACCU-CHEK AVIVA) SOLN 1 Units by In Vitro route 3 (three) times daily as needed. 07/14/18   [name], [place], DO  calcitRIOL (ROCALTROL) 0.25 MCG capsule Take 0.25 mcg by mouth daily. 01/29/21   [provider]  Calcium Carbonate (CALCIUM 600 PO) Take 600 mg by mouth 2 (two) times daily.    [provider]  carvedilol (COREG) 25 MG tablet Take 1 tablet (25 mg total) by mouth 2 (two) times daily with a meal. 05/09/19   Royal Hawthorn, NP  cholecalciferol (VITAMIN D) 25 MCG (1000 UNIT) tablet Take 1,000 Units by mouth daily.    [provider]  diltiazem (CARDIZEM SR) 120 MG 12 hr capsule Take 1 capsule (120 mg total) by mouth 2 (two) times daily. 05/09/19  02/05/21  Royal Hawthorn, NP  ferrous sulfate 325 (65 FE) MG tablet Take 1 tablet (325 mg total) by mouth every other day. Patient taking differently: Take 325 mg by mouth daily with breakfast. 12/20/18   Melene Plan, MD  glucose blood (ACCU-CHEK AVIVA PLUS) test strip Use as instructed 07/14/18   [name], [place], DO  Insulin Pen Needle (B-D ULTRAFINE III SHORT PEN) 31G X 8 MM MISC CHECK BLOOD SUGARS ONCE IN MORNING BEFORE FOOD AND ONCE IN THE EVENING. 07/14/18   Talbert Forest, [place], DO  Lancets (ACCU-CHEK SOFT TOUCH) lancets Use as instructed 07/14/18   [name], [place], DO  oxyCODONE-acetaminophen (PERCOCET/ROXICET) 5-325 MG tablet Take 1 tablet by mouth every 6 (six) hours as needed. 02/05/21   Lars Mage,  PA-C      VITAL SIGNS:  Blood pressure (!) 180/70, pulse 67, temperature 98 F (36.7 C), temperature source Oral, resp. rate (!) 21, height 5\' 6"  (1.676 m), weight 59 kg, SpO2 98 %.  PHYSICAL EXAMINATION:  Physical Exam  GENERAL: Ill looking 72 y.o.-year-old African-American female patient lying in the bed with no acute distress.  EYES: Pupils equal, round, reactive to light and accommodation. No scleral icterus.  Positive pallor extraocular muscles intact.  HEENT: Head atraumatic, normocephalic. Oropharynx and nasopharynx clear.  NECK:  Supple, no jugular venous distention. No thyroid enlargement, no tenderness.  LUNGS: Normal breath sounds bilaterally, no wheezing, rales,rhonchi or crepitation. No use of accessory muscles of respiration.  CARDIOVASCULAR: Regular rate and rhythm, S1, S2 normal. No murmurs, rubs, or gallops.  ABDOMEN: Soft, nondistended, nontender. Bowel sounds present. No organomegaly or mass.  EXTREMITIES: No pedal edema, cyanosis, or clubbing.  NEUROLOGIC: Cranial nerves II through XII are intact. Muscle strength 5/5 in all extremities. Sensation intact. Gait not checked.  PSYCHIATRIC: The patient is alert and oriented x 3.  Normal affect and good eye  contact. SKIN: No obvious rash, lesion, or ulcer.   LABORATORY PANEL:   CBC Recent Labs  Lab 06/15/22 2320  WBC 6.0  HGB 6.5*  HCT 21.2*  PLT 260   ------------------------------------------------------------------------------------------------------------------  Chemistries  Recent Labs  Lab 06/15/22 2320 06/16/22 0525  NA 137 136  K 3.5 3.6  CL 105 103  CO2 19* 17*  GLUCOSE 108* 102*  BUN 53* 57*  CREATININE 7.79* 7.64*  CALCIUM 9.6 9.7  AST 11*  --   ALT 8  --   ALKPHOS 357*  --   BILITOT 0.5  --    ------------------------------------------------------------------------------------------------------------------  Cardiac Enzymes No results for input(s): "TROPONINI" in the last 168 hours. ------------------------------------------------------------------------------------------------------------------  RADIOLOGY:  DG Chest 2 View  Result Date: 06/15/2022 CLINICAL DATA:  Possible sepsis EXAM: CHEST - 2 VIEW COMPARISON:  12/13/2018 FINDINGS: Cardiac shadow is within normal limits. Lungs are well aerated bilaterally. No focal infiltrate or effusion is seen. No bony abnormality is noted. IMPRESSION: No active cardiopulmonary disease. Electronically Signed   By: Alcide Clever M.D.   On: 06/15/2022 23:45      IMPRESSION AND PLAN:  Assessment and Plan: * Acute blood loss anemia - The patient will admitted to a medical telemetry bed. - She was typed and crossmatched and will be transfused 2 units of packed red blood cells.  Will follow posttransfusion H&H as well as serial levels. - This is likely associated with GI bleeding giving her positive stool Hemoccult. - GI consult will be obtained. - I notified Willette Cluster NP on-call for [facility]. - We will continue the patient on IV Protonix 40 mg every 12 hours for now.  Acute lower UTI - The patient will be placed on IV Rocephin. - We will follow urine culture.  End-stage renal disease on hemodialysis [facility]) -  Nephrology consult to be obtained. - I notified Dr. Marisue Humble about the patient and he is aware.  Type 2 diabetes mellitus with chronic kidney disease, without long-term current use of insulin (HCC) -- She will be placed on supplemental coverage with Novolog SQ.  Dyslipidemia - Continue statin therapy.  Essential hypertension - We will continue her antihypertensives.   DVT prophylaxis: SCD Advanced Care Planning:  Code Status: full code.  Family Communication:  The plan of care was discussed in details with the patient (and family). I answered all questions. The patient  agreed to proceed with the above mentioned plan. Further management will depend upon hospital course. Disposition Plan: Back to previous home environment Consults called: Nephrology and GI. All the records are reviewed and case discussed with ED provider.  Status is: Inpatient    At the time of the admission, it appears that the appropriate admission status for this patient is inpatient.  This is judged to be reasonable and necessary in order to provide the required intensity of service to ensure the patient's safety given the presenting symptoms, physical exam findings and initial radiographic and laboratory data in the context of comorbid conditions.  The patient requires inpatient status due to high intensity of service, high risk of further deterioration and high frequency of surveillance required.  I certify that at the time of admission, it is my clinical judgment that the patient will require inpatient hospital care extending more than 2 midnights.                            Dispo: The patient is from: Home              Anticipated d/c is to: Home              Patient currently is not medically stable to d/c.              Difficult to place patient: No  Hannah Beat M.D on 06/16/2022 at 6:34 AM  [facility]   From 7 PM-7 AM, contact night-coverage [email or web address]  CC: Primary care physician; Loura Back, NP

## 2022-06-16 NOTE — Hospital Course (Addendum)
Same day note  Sara Davis is a 72 y.o. female with medical history significant for type 2 diabetes mellitus, depression, hypertension, dyslipidemia and end-stage renal disease on hemodialysis on TTS, presented to hospital with fatigue generalized weakness with dark stools.  In the ED patient had low-grade fever and was slightly hypotensive with creatinine of 7.7.   High sensitive troponin I was 86 and later 83.  Lactic acid was 1.2 and later 1.1.  CBC showed hemoglobin of 6.5 and hematocrit 21.2 compared to 8.2 and 24 on 02/21/2021.  KG showed normal sinus rhythm.  Chest x-ray was negative for infiltrate.  In the ED patient received 2 units of packed RBC 40 mg of IV Protonix cefepime and vancomycin and was then considered for admission to the hospital for further evaluation and treatment.  Patient seen and examined at bedside.  Patient was admitted to the hospital for generalized weakness  At the time of my evaluation, patient complains of  Physical examination reveals  Laboratory data and imaging was reviewed  Assessment and Plan.  Acute blood loss anemia secondary to GI bleed. Initial hemoglobin of 6.5.  Status post 2 unit packed RBC transfusion.  GI has been consulted for FOBT positive..  Continue IV Protonix.  Latest hemoglobin of 8.7.  Acute lower UTI Continue IV Rocephin.  Follow urine culture.  Urinalysis with significant abnormality.  Blood cultures negative in less than 12 hours.  No leukocytosis.   End-stage renal disease on hemodialysis [facility]) Nephrology has been notified for resumption of hemodialysis.   Type 2 diabetes mellitus with chronic kidney disease, without long-term current use of insulin (HCC) Continue sliding scale insulin.   Dyslipidemia Continue statins   Essential hypertension Continue Cardizem.    No Charge  Signed,  Tenny Craw, MD [facility]

## 2022-06-16 NOTE — Progress Notes (Addendum)
Received patient in bed to unit.  Alert and oriented.  Informed consent signed and in chart.   TX duration:3.5 h. Pt had cramping at last 10 mins.  Patient tolerated well.  Transported back to the room  Alert, without acute distress.  Hand-off given to patient's nurse.   Access used: AVG Access issues: none  Total UF removed: 2.4 L Medication(s) given: none Post HD VS: 170/72 P 80 R 18. O2 sat 100 % in room air. Post HD weight: 56.1kg   [facility]

## 2022-06-16 NOTE — Assessment & Plan Note (Signed)
-   Nephrology consult to be obtained. - I notified Dr. Marisue Humble about the patient and he is aware.

## 2022-06-17 ENCOUNTER — Encounter (HOSPITAL_COMMUNITY): Payer: Self-pay | Admitting: Family Medicine

## 2022-06-17 ENCOUNTER — Inpatient Hospital Stay (HOSPITAL_COMMUNITY): Payer: Medicare HMO | Admitting: Anesthesiology

## 2022-06-17 ENCOUNTER — Encounter (HOSPITAL_COMMUNITY)
Admission: EM | Disposition: A | Discharge status: Home / Self Care | Payer: Self-pay | Source: Home / Self Care | Attending: Internal Medicine

## 2022-06-17 DIAGNOSIS — Z992 Dependence on renal dialysis: Secondary | ICD-10-CM

## 2022-06-17 DIAGNOSIS — K2289 Other specified disease of esophagus: Secondary | ICD-10-CM

## 2022-06-17 DIAGNOSIS — N186 End stage renal disease: Secondary | ICD-10-CM

## 2022-06-17 DIAGNOSIS — D500 Iron deficiency anemia secondary to blood loss (chronic): Secondary | ICD-10-CM | POA: Diagnosis not present

## 2022-06-17 DIAGNOSIS — K31819 Angiodysplasia of stomach and duodenum without bleeding: Secondary | ICD-10-CM

## 2022-06-17 DIAGNOSIS — E1122 Type 2 diabetes mellitus with diabetic chronic kidney disease: Secondary | ICD-10-CM | POA: Diagnosis not present

## 2022-06-17 DIAGNOSIS — N390 Urinary tract infection, site not specified: Secondary | ICD-10-CM | POA: Diagnosis not present

## 2022-06-17 DIAGNOSIS — K5520 Angiodysplasia of colon without hemorrhage: Secondary | ICD-10-CM

## 2022-06-17 DIAGNOSIS — K5521 Angiodysplasia of colon with hemorrhage: Secondary | ICD-10-CM

## 2022-06-17 HISTORY — PX: HEMOSTASIS CLIP PLACEMENT: SHX6857

## 2022-06-17 HISTORY — PX: HOT HEMOSTASIS: SHX5433

## 2022-06-17 HISTORY — PX: ENTEROSCOPY: SHX5533

## 2022-06-17 HISTORY — PX: SCLEROTHERAPY: SHX6841

## 2022-06-17 LAB — URINE CULTURE

## 2022-06-17 LAB — BPAM RBC
ISSUE DATE / TIME: 202405140422
Unit Type and Rh: 5100
Unit Type and Rh: 5100

## 2022-06-17 LAB — TYPE AND SCREEN
Unit division: 0
Unit division: 0

## 2022-06-17 LAB — GLUCOSE, CAPILLARY: Glucose-Capillary: 109 mg/dL — ABNORMAL HIGH (ref 70–99)

## 2022-06-17 LAB — CULTURE, BLOOD (ROUTINE X 2): Special Requests: ADEQUATE

## 2022-06-17 LAB — HEPATITIS B SURFACE ANTIBODY, QUANTITATIVE: Hep B S AB Quant (Post): 104 m[IU]/mL (ref >9.9)

## 2022-06-17 SURGERY — ENTEROSCOPY
Anesthesia: Monitor Anesthesia Care

## 2022-06-17 MED ORDER — DILTIAZEM HCL ER 120 MG PO CP12: 120.0000 mg | ORAL_CAPSULE | Freq: Two times a day (BID) | ORAL | 0 refills | Status: DC

## 2022-06-17 MED ORDER — VITAMIN D3 25 MCG (1000 UNIT) PO TABS: 1000.0000 [IU] | ORAL_TABLET | Freq: Every day | ORAL | 2 refills | Status: AC

## 2022-06-17 MED ORDER — PROPOFOL 10 MG/ML IV BOLUS
INTRAVENOUS | Status: DC | PRN
  Administered 2022-06-17: 20 mg via INTRAVENOUS

## 2022-06-17 MED ORDER — PANTOPRAZOLE SODIUM 40 MG PO TBEC: 40.0000 mg | DELAYED_RELEASE_TABLET | Freq: Every day | ORAL | 0 refills | Status: DC

## 2022-06-17 MED ORDER — PANTOPRAZOLE SODIUM 40 MG PO TBEC
40.0000 mg | DELAYED_RELEASE_TABLET | Freq: Every day | ORAL | Status: DC
  Administered 2022-06-17: 40 mg via ORAL
  Filled 2022-06-17: qty 1

## 2022-06-17 MED ORDER — SODIUM CHLORIDE 0.9 % IV SOLN: INTRAVENOUS | Status: DC | PRN

## 2022-06-17 MED ORDER — PROPOFOL 500 MG/50ML IV EMUL
INTRAVENOUS | Status: DC | PRN
  Administered 2022-06-17: 100 ug/kg/min via INTRAVENOUS

## 2022-06-17 MED ORDER — SODIUM CHLORIDE (PF) 0.9 % IJ SOLN
PREFILLED_SYRINGE | INTRAMUSCULAR | Status: DC | PRN
  Administered 2022-06-17: 1 mL

## 2022-06-17 NOTE — Progress Notes (Signed)
[facility] Progress Note   Subjective:    Seen and examined patient at bedside. She tolerated yesterday's HD with net UF 2.4L. S/p EGD today and hemostatic clip placed. Patient will on PPI X 4 weeks and GI recommends she receive IV Fe in outpatient. Plan for discharge today.  Objective Vitals:   06/17/22 0813 06/17/22 1006 06/17/22 1148 06/17/22 1203  BP: 124/62 (!) 157/55 (!) 156/62 (!) 157/73  Pulse: 69 68 74 67  Resp: 15 14 20 14   Temp: 98.7 F (37.1 C) (!) 97.2 F (36.2 C) 98.6 F (37 C) 98.6 F (37 C)  TempSrc: Oral Temporal    SpO2: 98% 96% 97% 96%  Weight:      Height:       Physical Exam General: Awake, alert, NAD, on RA Heart: S1 and S2; No murmurs, gallops, or rubs Lungs: Clear throughout Abdomen: Soft and non-tender Extremities:No LE edema Dialysis Access: R AVG (+) B/T   Filed Weights   06/16/22 1554 06/16/22 1916 06/17/22 0502  Weight: 58.5 kg 56.1 kg 54.9 kg    Intake/Output Summary (Last 24 hours) at 06/17/2022 1419 Last data filed at 06/17/2022 1141 Gross per 24 hour  Intake 1122.76 ml  Output 2400 ml  Net -1277.24 ml    Additional Objective Labs: Basic Metabolic Panel: Recent Labs  Lab 06/15/22 2320 06/16/22 0525 06/16/22 2242  NA 137 136  --   K 3.5 3.6  --   CL 105 103  --   CO2 19* 17*  --   GLUCOSE 108* 102*  --   BUN 53* 57*  --   CREATININE 7.79* 7.64*  --   CALCIUM 9.6 9.7  --   PHOS  --   --  3.7   Liver Function Tests: Recent Labs  Lab 06/15/22 2320  AST 11*  ALT 8  ALKPHOS 357*  BILITOT 0.5  PROT 5.6*  ALBUMIN 3.1*   No results for input(s): "LIPASE", "AMYLASE" in the last 168 hours. CBC: Recent Labs  Lab 06/15/22 2320 06/16/22 0525 06/16/22 1113 06/16/22 2242  WBC 6.0 6.9  --   --   NEUTROABS 3.7  --   --   --   HGB 6.5* 8.7* 9.9* 10.1*  HCT 21.2* 27.1* 30.5* 30.1*  MCV 94.6 91.6  --   --   PLT 260 234  --   --    Blood Culture    Component Value Date/Time   SDES BLOOD LEFT HAND  06/15/2022 2315   SPECREQUEST  06/15/2022 2315    BOTTLES DRAWN AEROBIC AND ANAEROBIC Blood Culture adequate volume   CULT  06/15/2022 2315    NO GROWTH 2 DAYS Performed at [facility], [address], [place], [place] [address]    REPTSTATUS PENDING 06/15/2022 2315    Cardiac Enzymes: No results for input(s): "CKTOTAL", "CKMB", "CKMBINDEX", "TROPONINI" in the last 168 hours. CBG: Recent Labs  Lab 06/17/22 1151  GLUCAP 109*   Iron Studies: No results for input(s): "IRON", "TIBC", "TRANSFERRIN", "FERRITIN" in the last 72 hours. Lab Results  Component Value Date   INR 1.1 06/15/2022   INR 1.1 12/13/2018   INR 1.10 01/07/2018   Studies/Results: DG Chest 2 View  Result Date: 06/15/2022 CLINICAL DATA:  Possible sepsis EXAM: CHEST - 2 VIEW COMPARISON:  12/13/2018 FINDINGS: Cardiac shadow is within normal limits. Lungs are well aerated bilaterally. No focal infiltrate or effusion is seen. No bony abnormality is noted. IMPRESSION: No active cardiopulmonary disease. Electronically  Signed   By: Alcide Clever M.D.   On: 06/15/2022 23:45    Medications:  cefTRIAXone (ROCEPHIN)  IV 1 g (06/16/22 2231)    atorvastatin  80 mg Oral Daily   calcitRIOL  0.25 mcg Oral Daily   calcium carbonate  1 tablet Oral BID   carvedilol  25 mg Oral BID WC   cholecalciferol  1,000 Units Oral Daily   darbepoetin (ARANESP) injection - DIALYSIS  100 mcg Subcutaneous Q Tue-1800   diltiazem  120 mg Oral BID   ferrous sulfate  325 mg Oral Q breakfast   pantoprazole  40 mg Oral Daily    Dialysis Orders: TTS G-O 3h   400/700   58.5kg   2/2 bath  RUE AVG   Hep 3000 - last HD 5/09, post wt 59.3kg, low wt gains coming in at dry wt - last Hbs --> 9.0 on 4/25, 8.0 on 5/2, 7.6 on 5/9 - ferritin 700- 900 last 3 mos - mircera 120 mcg IV q 2 wks, last 5/02, due 5/16 - venofer 50mg  weekly IV, last 5/09  Home meds include - lipitor, sensipar 180 qd, norvasc 10 qd, rocaltrol 0.25 mcg po tiw, coreg 25  bid, cardizem SR 120mg  bid, percocet prn, prns/ vits/ supps   Assessment/Plan: Acute blood loss anemia - symptomatic w/ Hb 6.5. SP 2u prbc's overnight. Stool + hemoccult. Per GI/ pmd. 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 GI has recommended to oral Protonix for 4 weeks and recommends IV iron as outpatient. UTI - received IV rocephin and appears to have completed the course. ESRD - on HD TTS.  Missed HD last Saturday. S/p HD 5/14. Will resume tomorrow in outpatient. HTN/ volume - BP's are up a bit, cont coreg/ norvasc/ cardizem. At dry wt, lower if possible while here.  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  Will order darbe esa 100 mcg SQ weekly while here-received on 5/14. SP 2u prbc's here. Dc Hgb is 10.1. MBD ckd - CCa in range, add on phos. Cont daily po vdra.  H/o CVA Dispo - okay for dc from renal standpoint.  Salome Holmes, NP [facility] 06/17/2022,2:19 PM  LOS: 1 day

## 2022-06-17 NOTE — Interval H&P Note (Signed)
History and Physical Interval Note:  06/17/2022 11:03 AM  Sara Davis  has presented today for surgery, with the diagnosis of anemia, history of small bowel arteriovenous malformations.  The various methods of treatment have been discussed with the patient and family. After consideration of risks, benefits and other options for treatment, the patient has consented to  Procedure(s): ENTEROSCOPY (N/A) as a surgical intervention.  The patient's history has been reviewed, patient examined, no change in status, stable for surgery.  I have reviewed the patient's chart and labs.  Questions were answered to the patient's satisfaction.     [name]

## 2022-06-17 NOTE — Anesthesia Procedure Notes (Signed)
Procedure Name: MAC Date/Time: 06/17/2022 11:15 AM  Performed by: Nils Pyle, CRNAPre-anesthesia Checklist: Patient identified, Emergency Drugs available, Suction available and Patient being monitored Patient Re-evaluated:Patient Re-evaluated prior to induction Oxygen Delivery Method: Nasal cannula Preoxygenation: Pre-oxygenation with 100% oxygen Induction Type: IV induction Placement Confirmation: positive ETCO2 and breath sounds checked- equal and bilateral Dental Injury: Teeth and Oropharynx as per pre-operative assessment

## 2022-06-17 NOTE — Progress Notes (Signed)
Sheilah Mins to be discharged Home per MD order. Discussed prescriptions and follow up appointments with the patient. Prescriptions and medication list explained in detail. Patient and son verbalized understanding.  Skin clean, dry and intact without evidence of skin break down, no evidence of skin tears noted. IV catheter discontinued intact. Site without signs and symptoms of complications. Dressing and pressure applied. Pt denies pain at the site currently. No complaints noted.  Patient free of lines, drains, and wounds.   An After Visit Summary (AVS) was printed and given to the patient. Patient escorted via wheelchair, and discharged home via private auto.  Arvilla Meres, RN

## 2022-06-17 NOTE — Anesthesia Preprocedure Evaluation (Addendum)
Anesthesia Evaluation  Patient identified by MRN, date of birth, ID band Patient awake    Reviewed: Allergy & Precautions, H&P , NPO status , Patient's Chart, lab work & pertinent test results  Airway Mallampati: II  TM Distance: >3 FB Neck ROM: Full    Dental no notable dental hx. (+) Edentulous Upper, Edentulous Lower,    Pulmonary neg pulmonary ROS, Current Smoker and Patient abstained from smoking.   Pulmonary exam normal breath sounds clear to auscultation       Cardiovascular Exercise Tolerance: Good hypertension, Pt. on medications +CHF  negative cardio ROS Normal cardiovascular exam Rhythm:Regular Rate:Normal     Neuro/Psych  Headaches PSYCHIATRIC DISORDERS  Depression    negative neurological ROS  negative psych ROS   GI/Hepatic negative GI ROS, Neg liver ROS,,,  Endo/Other  diabetes, Type 2    Renal/GU Dialysis and ESRFRenal disease  negative genitourinary   Musculoskeletal negative musculoskeletal ROS (+)    Abdominal   Peds negative pediatric ROS (+)  Hematology negative hematology ROS (+) Blood dyscrasia, anemia   Anesthesia Other Findings   Reproductive/Obstetrics negative OB ROS                             Anesthesia Physical Anesthesia Plan  ASA: 3  Anesthesia Plan: MAC   Post-op Pain Management: Minimal or no pain anticipated   Induction: Intravenous  PONV Risk Score and Plan: 1 and Propofol infusion  Airway Management Planned: Natural Airway and Simple Face Mask  Additional Equipment: None  Intra-op Plan:   Post-operative Plan:   Informed Consent: I have reviewed the patients History and Physical, chart, labs and discussed the procedure including the risks, benefits and alternatives for the proposed anesthesia with the patient or authorized representative who has indicated his/her understanding and acceptance.       Plan Discussed with:  Anesthesiologist and CRNA  Anesthesia Plan Comments:        Anesthesia Quick Evaluation

## 2022-06-17 NOTE — TOC Transition Note (Signed)
Transition of Care [facility]) - CM/SW Discharge Note   Patient Details  Name: Sara Davis MRN: [id number] Date of Birth: [birth year]  Transition [facility] CM/SW Contact:  Tom-Johnson, Hershal Coria, RN Phone Number: 06/17/2022, 1:42 PM   Clinical Narrative:     Patient is scheduled for discharge today.  Readmission Risk Assessment done. Discharge instructions on AVS. No TOC needs or recommendations noted. Son, Viviann Spare to transport at discharge.  No further TOC needs noted.      Final next level of care: Home/Self Care Barriers to Discharge: Barriers Resolved   Patient Goals and CMS Choice CMS Medicare.gov Compare Post Acute Care list provided to:: Patient Choice offered to / list presented to : NA  Discharge Placement                  Patient to be transferred to facility by: Son Name of family member notified: Viviann Spare    Discharge Plan and Services Additional resources added to the After Visit Summary for                  DME Arranged: N/A DME Agency: NA       HH Arranged: NA HH Agency: NA        Social Determinants of Health (SDOH) Interventions SDOH Screenings   Depression (PHQ2-9): Low Risk  (05/16/2019)  Tobacco Use: High Risk (06/17/2022)     Readmission Risk Interventions    06/17/2022    1:40 PM  Readmission Risk Prevention Plan  Transportation Screening Complete  PCP or Specialist Appt within 5-7 Days Complete  Home Care Screening Complete  Medication Review (RN CM) Referral to Pharmacy

## 2022-06-17 NOTE — Anesthesia Postprocedure Evaluation (Signed)
Anesthesia Post Note  Patient: Sara Davis  Procedure(s) Performed: ENTEROSCOPY HOT HEMOSTASIS (ARGON PLASMA COAGULATION/BICAP) SCLEROTHERAPY HEMOSTASIS CLIP PLACEMENT     Patient location during evaluation: PACU Anesthesia Type: MAC Level of consciousness: awake and alert Pain management: pain level controlled Vital Signs Assessment: post-procedure vital signs reviewed and stable Respiratory status: spontaneous breathing, nonlabored ventilation, respiratory function stable and patient connected to nasal cannula oxygen Cardiovascular status: blood pressure returned to baseline and stable Postop Assessment: no apparent nausea or vomiting Anesthetic complications: no   No notable events documented.  Last Vitals:  Vitals:   06/17/22 1148 06/17/22 1203  BP: (!) 156/62 (!) 157/73  Pulse: 74 67  Resp: 20 14  Temp: 37 C 37 C  SpO2: 97% 96%    Last Pain:  Vitals:   06/17/22 1203  TempSrc:   PainSc: 0-No pain                 Homer Miller

## 2022-06-17 NOTE — Transfer of Care (Signed)
Immediate Anesthesia Transfer of Care Note  Patient: Sara Davis  Procedure(s) Performed: ENTEROSCOPY HOT HEMOSTASIS (ARGON PLASMA COAGULATION/BICAP) SCLEROTHERAPY HEMOSTASIS CLIP PLACEMENT  Patient Location: PACU  Anesthesia Type:MAC  Level of Consciousness: awake, alert , and oriented  Airway & Oxygen Therapy: Patient Spontanous Breathing and Patient connected to nasal cannula oxygen  Post-op Assessment: Report given to RN, Post -op Vital signs reviewed and stable, and Patient moving all extremities X 4  Post vital signs: Reviewed and stable  Last Vitals:  Vitals Value Taken Time  BP 156/62 06/17/22 1148  Temp    Pulse 69 06/17/22 1148  Resp 25 06/17/22 1148  SpO2 99 % 06/17/22 1148  Vitals shown include unvalidated device data.  Last Pain:  Vitals:   06/17/22 1006  TempSrc: Temporal  PainSc: 0-No pain         Complications: No notable events documented.

## 2022-06-17 NOTE — Discharge Summary (Signed)
Physician Discharge Summary  Sara Davis:[id number] DOB: January 23, 1951 DOA: 06/15/2022  PCP: Loura Back, NP  Admit date: 06/15/2022 Discharge date: 06/17/2022  Admitted From: Home  Discharge disposition: Home  Recommendations for Outpatient Follow-Up:   Follow up with your primary care provider in one week.  Check blood work at that time. Patient would benefit from IV iron as outpatient with hemodialysis. Has been prescribed Protonix for 1 month.  Discharge Diagnosis:   Principal Problem:   Acute blood loss anemia Active Problems:   Acute lower UTI   End-stage renal disease on hemodialysis (HCC)   Type 2 diabetes mellitus with chronic kidney disease, without long-term current use of insulin (HCC)   Essential hypertension   Dyslipidemia   Anemia due to chronic blood loss   AVM (arteriovenous malformation) of small bowel, acquired with hemorrhage   Discharge Condition: Improved.  Diet recommendation: Low sodium, heart healthy.  Carbohydrate-modified.    Wound care: None.  Code status: Full.   History of Present Illness:   Sara Davis is a 72 y.o. female with medical history significant for type 2 diabetes mellitus, depression, hypertension, dyslipidemia and end-stage renal disease on hemodialysis on TTS, presented to hospital with fatigue generalized weakness with dark stools.  In the ED, patient had low-grade fever and was slightly hypotensive with creatinine of 7.7.   High sensitive troponin I was 86 and later 83.  Lactic acid was 1.2 and later 1.1.  CBC showed hemoglobin of 6.5 and hematocrit 21.2 compared to 8.2 and 24 on 02/21/2021.  EKG showed normal sinus rhythm.  Chest x-ray was negative for infiltrate.  In the ED, patient received 2 units of packed RBC, 40 mg of IV Protonix, cefepime and vancomycin and was then considered for admission to the hospital for further evaluation and treatment.   Hospital Course:   Following conditions were addressed during  hospitalization as listed below,  Acute blood loss anemia secondary to GI bleed. Initial hemoglobin of 6.5.  Status post 2 unit packed RBC transfusion.  GI was consulted and patient underwent upper GI endoscopy and small bowel endoscopy.  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  GI has recommended to oral Protonix for 4 weeks and recommends IV iron as outpatient.  Hemoglobin prior to discharge was 10.1.   Abnormal urinalysis Patient received IV Rocephin during hospitalization.  Urine culture with multiple species.  Will discontinue antibiotic on discharge.   End-stage renal disease on hemodialysis [facility]) Nephrology was consulted for hemodialysis and received hemodialysis on 06/15/2020.   Type 2 diabetes mellitus with chronic kidney disease, without long-term current use of insulin (HCC) Continue diet control.  Dyslipidemia Continue statins   Essential hypertension Continue Cardizem.  Disposition.  At this time, patient is stable for disposition home with outpatient PCP follow-up   Medical Consultants:   GI Nephrology  Procedures:    Hemodialysis Small bowel endoscopy with APC treatment, hemostatic clip placement  Subjective:   Today, patient was seen and examined at bedside.  Denies any nausea vomiting fever or chills.  Seen before endoscopic evaluation.  Discharge Exam:   Vitals:   06/17/22 1148 06/17/22 1203  BP: (!) 156/62 (!) 157/73  Pulse: 74 67  Resp: 20 14  Temp: 98.6 F (37 C) 98.6 F (37 C)  SpO2: 97% 96%   Vitals:   06/17/22 0813 06/17/22 1006 06/17/22 1148 06/17/22 1203  BP: 124/62 (!) 157/55 (!) 156/62 (!) 157/73  Pulse: 69 68 74 67  Resp: 15 14 20 14   Temp: 98.7 F (37.1 C) (!) 97.2 F (36.2 C) 98.6 F (37 C) 98.6 F (37 C)  TempSrc: Oral Temporal    SpO2: 98% 96% 97% 96%  Weight:      Height:       Body mass index is 19.54 kg/m.  General: Alert awake, not in obvious distress  thinly built HENT: pupils equally reacting to light,  No scleral pallor or icterus noted. Oral mucosa is moist.  Chest:  Clear breath sounds.   No crackles or wheezes.  CVS: S1 &S2 heard. No murmur.  Regular rate and rhythm. Abdomen: Soft, nontender, nondistended.  Bowel sounds are heard.   Extremities: No cyanosis, clubbing or edema.  Peripheral pulses are palpable. Psych: Alert, awake and oriented, normal mood CNS:  No cranial nerve deficits.  Power equal in all extremities.   Skin: Warm and dry.  No rashes noted.  The results of significant diagnostics from this hospitalization (including imaging, microbiology, ancillary and laboratory) are listed below for reference.     Diagnostic Studies:   DG Chest 2 View  Result Date: 06/15/2022 CLINICAL DATA:  Possible sepsis EXAM: CHEST - 2 VIEW COMPARISON:  12/13/2018 FINDINGS: Cardiac shadow is within normal limits. Lungs are well aerated bilaterally. No focal infiltrate or effusion is seen. No bony abnormality is noted. IMPRESSION: No active cardiopulmonary disease. Electronically Signed   By: Alcide Clever M.D.   On: 06/15/2022 23:45     Labs:   Basic Metabolic Panel: Recent Labs  Lab 06/15/22 2320 06/16/22 0525 06/16/22 2242  NA 137 136  --   K 3.5 3.6  --   CL 105 103  --   CO2 19* 17*  --   GLUCOSE 108* 102*  --   BUN 53* 57*  --   CREATININE 7.79* 7.64*  --   CALCIUM 9.6 9.7  --   PHOS  --   --  3.7   GFR Estimated Creatinine Clearance: 5.9 mL/min (A) (by C-G formula based on SCr of 7.64 mg/dL (H)). Liver Function Tests: Recent Labs  Lab 06/15/22 2320  AST 11*  ALT 8  ALKPHOS 357*  BILITOT 0.5  PROT 5.6*  ALBUMIN 3.1*   No results for input(s): "LIPASE", "AMYLASE" in the last 168 hours. No results for input(s): "AMMONIA" in the last 168 hours. Coagulation profile Recent Labs  Lab 06/15/22 2320  INR 1.1    CBC: Recent Labs  Lab 06/15/22 2320 06/16/22 0525 06/16/22 1113 06/16/22 2242  WBC 6.0 6.9  --    --   NEUTROABS 3.7  --   --   --   HGB 6.5* 8.7* 9.9* 10.1*  HCT 21.2* 27.1* 30.5* 30.1*  MCV 94.6 91.6  --   --   PLT 260 234  --   --    Cardiac Enzymes: No results for input(s): "CKTOTAL", "CKMB", "CKMBINDEX", "TROPONINI" in the last 168 hours. BNP: Invalid input(s): "POCBNP" CBG: Recent Labs  Lab 06/17/22 1151  GLUCAP 109*   D-Dimer No results for input(s): "DDIMER" in the last 72 hours. Hgb A1c No results for input(s): "HGBA1C" in the last 72 hours. Lipid Profile No results for input(s): "CHOL", "HDL", "LDLCALC", "TRIG", "CHOLHDL", "LDLDIRECT" in the last 72 hours. Thyroid function studies No results for input(s): "TSH", "T4TOTAL", "T3FREE", "THYROIDAB" in the last 72 hours.  Invalid input(s): "FREET3" Anemia work up No results for input(s): "VITAMINB12", "FOLATE", "FERRITIN", "TIBC", "IRON", "RETICCTPCT" in  the last 72 hours. Microbiology Recent Results (from the past 240 hour(s))  Urine Culture (for pregnant, neutropenic or urologic patients or patients with an indwelling urinary catheter)     Status: Abnormal   Collection Time: 06/15/22 10:53 PM   Specimen: Urine, Clean Catch  Result Value Ref Range Status   Specimen Description URINE, CLEAN CATCH  Final   Special Requests   Final    NONE Performed at [facility], [address], [place], [place] [address]    Culture MULTIPLE SPECIES PRESENT, SUGGEST RECOLLECTION (A)  Final   Report Status 06/17/2022 FINAL  Final  Blood Culture (routine x 2)     Status: None (Preliminary result)   Collection Time: 06/15/22 11:10 PM   Specimen: BLOOD LEFT FOREARM  Result Value Ref Range Status   Specimen Description BLOOD LEFT FOREARM  Final   Special Requests   Final    BOTTLES DRAWN AEROBIC AND ANAEROBIC Blood Culture results may not be optimal due to an inadequate volume of blood received in culture bottles   Culture   Final    NO GROWTH 2 DAYS Performed at [facility], [address] [place], [place]  [address]    Report Status PENDING  Incomplete  Blood Culture (routine x 2)     Status: None (Preliminary result)   Collection Time: 06/15/22 11:15 PM   Specimen: BLOOD LEFT HAND  Result Value Ref Range Status   Specimen Description BLOOD LEFT HAND  Final   Special Requests   Final    BOTTLES DRAWN AEROBIC AND ANAEROBIC Blood Culture adequate volume   Culture   Final    NO GROWTH 2 DAYS Performed at [facility], [address], [place], [place] [address]    Report Status PENDING  Incomplete  Resp panel by RT-PCR (RSV, Flu A&B, Covid) Anterior Nasal Swab     Status: None   Collection Time: 06/15/22 11:20 PM   Specimen: Anterior Nasal Swab  Result Value Ref Range Status   SARS Coronavirus 2 by RT PCR NEGATIVE NEGATIVE Final   Influenza A by PCR NEGATIVE NEGATIVE Final   Influenza B by PCR NEGATIVE NEGATIVE Final    Comment: (NOTE) The Xpert Xpress SARS-CoV-2/FLU/RSV plus assay is intended as an aid in the diagnosis of influenza from Nasopharyngeal swab specimens and should not be used as a sole basis for treatment. Nasal washings and aspirates are unacceptable for Xpert Xpress SARS-CoV-2/FLU/RSV testing.  Fact Sheet for Patients: BloggerCourse.com  Fact Sheet for Healthcare Providers: SeriousBroker.it  This test is not yet approved or cleared by the [place] FDA and has been authorized for detection and/or diagnosis of SARS-CoV-2 by FDA under an Emergency Use Authorization (EUA). This EUA will remain in effect (meaning this test can be used) for the duration of the COVID-19 declaration under Section 564(b)(1) of the Act, 21 U.S.C. section 360bbb-3(b)(1), unless the authorization is terminated or revoked.     Resp Syncytial Virus by PCR NEGATIVE NEGATIVE Final    Comment: (NOTE) Fact Sheet for Patients: BloggerCourse.com  Fact Sheet for Healthcare  Providers: SeriousBroker.it  This test is not yet approved or cleared by the [place] FDA and has been authorized for detection and/or diagnosis of SARS-CoV-2 by FDA under an Emergency Use Authorization (EUA). This EUA will remain in effect (meaning this test can be used) for the duration of the COVID-19 declaration under Section 564(b)(1) of the Act, 21 U.S.C. section 360bbb-3(b)(1), unless the authorization is  terminated or revoked.  Performed at [facility], [address] [place], [place] [address]   MRSA Next Gen by PCR, Nasal     Status: None   Collection Time: 06/16/22 10:23 AM   Specimen: Nasal Mucosa; Nasal Swab  Result Value Ref Range Status   MRSA by PCR Next Gen NOT DETECTED NOT DETECTED Final    Comment: (NOTE) The GeneXpert MRSA Assay (FDA approved for NASAL specimens only), is one component of a comprehensive MRSA colonization surveillance program. It is not intended to diagnose MRSA infection nor to guide or monitor treatment for MRSA infections. Test performance is not FDA approved in patients less than 60 years old. Performed at [facility], [address], [place], [place] [address]      Discharge Instructions:   Discharge Instructions     Diet - low sodium heart healthy   Complete by: As directed    Diet Carb Modified   Complete by: As directed    Discharge instructions   Complete by: As directed    Follow-up with your primary care provider in 1 week.  Check blood work at that time.  Seek medical attention for worsening symptoms.  Take medications as prescribed.   Increase activity slowly   Complete by: As directed       Allergies as of 06/17/2022       Reactions   Aspirin Other (See Comments)   Upset stomach, can only take coated aspirin but prefers to not take it at all        Medication List     STOP taking these medications    amLODipine 10 MG tablet Commonly known as: NORVASC   ferrous  sulfate 325 (65 FE) MG tablet   oxyCODONE-acetaminophen 5-325 MG tablet Commonly known as: PERCOCET/ROXICET       TAKE these medications    Accu-Chek Aviva Plus test strip Generic drug: glucose blood Use as instructed   accu-chek soft touch lancets Use as instructed   atorvastatin 80 MG tablet Commonly known as: LIPITOR Take 1 tablet (80 mg total) by mouth daily.   Auryxia 1 GM 210 MG(Fe) tablet Generic drug: ferric citrate Take 420 mg by mouth 3 (three) times daily.   B-D SINGLE USE SWABS REGULAR Pads 1 Units by Does not apply route 3 (three) times daily as needed.   calcitRIOL 0.25 MCG capsule Commonly known as: ROCALTROL Take 0.25 mcg by mouth daily.   CALCIUM 600 PO Take 600 mg by mouth 2 (two) times daily.   carvedilol 25 MG tablet Commonly known as: COREG Take 1 tablet (25 mg total) by mouth 2 (two) times daily with a meal.   cholecalciferol 25 MCG (1000 UNIT) tablet Commonly known as: VITAMIN D3 Take 1 tablet (1,000 Units total) by mouth daily.   cinacalcet 60 MG tablet Commonly known as: SENSIPAR Take 180 mg by mouth daily.   DIALYVITE TABLET Tabs Take 1 tablet by mouth daily.   diltiazem 120 MG 12 hr capsule Commonly known as: CARDIZEM SR Take 1 capsule (120 mg total) by mouth 2 (two) times daily.   pantoprazole 40 MG tablet Commonly known as: PROTONIX Take 1 tablet (40 mg total) by mouth daily for 28 days. Start taking on: Jun 18, 2022          Time coordinating discharge: 39 minutes  Signed:  Shekia Kuper  [facility] 06/17/2022, 1:22 PM

## 2022-06-17 NOTE — Op Note (Signed)
[name] Procedure Date : 06/17/2022 MRN: [id number] Attending MD: Starr Lake. Myrtie Neither , MD, [id number] Date of Birth: 07-Mar-1950 CSN: [id number] Age: 72 Admit Type: Inpatient Procedure:                Small bowel enteroscopy Indications:              Iron deficiency anemia secondary to chronic blood                            loss, Occult blood in stool, Hx of Arteriovenous                            malformation in the small intestine Providers:                Starr Lake. Myrtie Neither, MD, Jasmine Pang, RN,                            Sunday Corn Mbumina, Technician Referring MD:              Medicines:                Monitored Anesthesia Care Complications:            No immediate complications. Estimated Blood Loss:     Estimated blood loss was minimal. Procedure:                Pre-Anesthesia Assessment:                           - Prior to the procedure, a History and Physical                            was performed, and patient medications and                            allergies were reviewed. The patient's tolerance of                            previous anesthesia was also reviewed. The risks                            and benefits of the procedure and the sedation                            options and risks were discussed with the patient.                            All questions were answered, and informed consent                            was obtained. Prior Anticoagulants: The patient has                            taken no anticoagulant or antiplatelet agents. ASA  Grade Assessment: IV - A patient with severe                            systemic disease that is a constant threat to life.                            After reviewing the risks and benefits, the patient                            was deemed in satisfactory condition to undergo the                            procedure.                            After obtaining informed consent, the endoscope was                            passed under direct vision. Throughout the                            procedure, the patient's blood pressure, pulse, and                            oxygen saturations were monitored continuously. The                            [id number]-HQ190L ([id number] Olympus colonoscope was                            introduced through the mouth and advanced to the                            proximal jejunum (approximately 130 cm of reduced                            scope). The small bowel enteroscopy was                            accomplished without difficulty. The patient                            tolerated the procedure well. Scope In: Scope Out: Findings:      Localized mucosal changes were found in the lower third of the       esophagus. Specifically, scant adherent black pill debris along with a       small pill fragment in the area (possible iron tablet).      The stomach was normal.      There was no evidence of significant pathology in the entire examined       portion of jejunum.      A few angioectasias with no bleeding were found in the duodenal bulb.       Coagulation for hemostasis using argon plasma at 0.5 liters/minute and       20 watts  was successful to ablate the lesions. One of the AVMs continue       to ooze blood after APC treatment, so the area was successfully injected       with 1 mL of a 0.01 mg/mL solution of epinephrine for hemostasis. This       controlled the bleeding. To prevent bleeding post-intervention, one       hemostatic clip was successfully placed (MR conditional). There was no       bleeding at the end of the procedure. Impression:               - Mucosal changes in the esophagus.                           - Normal stomach.                           - The examined portion of the jejunum was normal.                           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                             Treated with argon plasma coagulation (APC).                            Injected. Clip (MR conditional) was placed.                           - No specimens collected. Recommendation:           - Return patient to hospital ward for ongoing care.                           - Use Protonix (pantoprazole) 40 mg PO daily for 4                            weeks.                           - This patient's blood counts and iron levels                            should be regularly checked at dialysis and IV iron                            administered as needed.                           Her outpatient oral iron supplementation appears                            insufficient to control her anemia since there is                            likely to be recurrence of intermittent occult GI  blood loss from small bowel AVMs.                           - Regular diet.                           This patient can be discharged home anytime from a                            GI perspective. Inpatient GI service signing off,                            call as needed. Procedure Code(s):        --- Professional ---                           [phone], Small intestinal endoscopy, enteroscopy                            beyond second portion of duodenum, not including                            ileum; with control of bleeding (eg, injection,                            bipolar cautery, unipolar cautery, laser, heater                            probe, stapler, plasma coagulator) Diagnosis Code(s):        --- Professional ---                           K22.89, Other specified disease of esophagus                           K31.819, Angiodysplasia of stomach and duodenum                            without bleeding                           D50.0, Iron deficiency anemia secondary to blood                            loss (chronic)                           R19.5, Other fecal  abnormalities CPT copyright 2022 American Medical Association. All rights reserved. The codes documented in this report are preliminary and upon coder review may  be revised to meet current compliance requirements. Chez Bulnes L. Myrtie Neither, MD 06/17/2022 11:51:47 AM This report has been signed electronically. Number of Addenda: 0

## 2022-06-17 NOTE — Plan of Care (Signed)
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 GI has recommended to oral Protonix for 4 weeks and recommends IV iron as outpatient.   Abnormal U/A: received IV Rocephin. Urine cx showed multiple species, ABX were stopped!  Aranesp: Given: Yes   Date and amount of last dose: on 06/16/22  Last Hgb: 10.1 PRBC's Given: Yes Date/# of units: 2 units PRBCs on 06/16/22 ESA dose for discharge: Mircera 120 mcg IV q 2 weeks already ordered IV Iron dose at discharge: Continue weekly IV Fe  Heparin change: Yes-s/p GIB, hold Heparin X 1 week then resume  EDW Change: Yes New EDW: Lower to 56.5kg  Bath Change: No  Access intervention/Change: No   Hectorol/Calcitriol change: N/A  Discharge Labs: Calcium 9.7 Phosphorus 3.7 Albumin 3.1 K+ 3.6  IV Antibiotics: No Details: Urine Cx showed multiple species. ABXs were stopped  On Coumadin?: No   OTHER/APPTS/LAB ORDERS: Please obtain CBC including iron studies with ferritin at next HD. May need to give IV Fe per dc summary    D/C Meds to be reconciled by nurse after every discharge.  Completed By: Salome Holmes, NP-C 06/17/2022, 5:35 PM  [facility] Pager: [phone]  Reviewed by: MD:______ RN_______

## 2022-06-17 NOTE — Progress Notes (Addendum)
Pt receives out-pt HD at [facility] on TTS. Will assist as needed.   Olivia Canter Renal Navigator [phone]  Addendum at 1:46 pm: D/C order noted. Contacted Berenice Primas to advise clinic of pt's d/c today and that pt should resume care tomorrow.

## 2022-06-18 LAB — CULTURE, BLOOD (ROUTINE X 2): Culture: NO GROWTH

## 2022-06-18 NOTE — TOC Transition Note (Signed)
Transition of Care - Initial Contact from Inpatient Facility  Date of discharge: 06/17/22 Date of contact: 06/18/22  Method: Phone Spoke to: Patient's Son  Patient contacted to discuss transition of care from recent inpatient hospitalization. Patient was admitted to [facility] from 06/15/22-06/17/22 with discharge diagnosis of acute blood loss anemia (s/p EGD).  The discharge medication list was reviewed.  Patient received HD today. Informed by patient's son of patient's feeling dizzy after HD today. Reviewed post weight after today's HD. Will keep EDW at 57.5kg for now and monitor her symptoms and trend. Next HD on 06/19/22 at [place].  Salome Holmes, NP

## 2022-06-19 LAB — CULTURE, BLOOD (ROUTINE X 2)

## 2022-06-20 LAB — CULTURE, BLOOD (ROUTINE X 2)

## 2022-06-21 ENCOUNTER — Encounter (HOSPITAL_COMMUNITY): Payer: Self-pay | Admitting: Gastroenterology

## 2022-09-10 ENCOUNTER — Other Ambulatory Visit (HOSPITAL_COMMUNITY): Payer: Self-pay | Admitting: Nephrology

## 2022-09-10 DIAGNOSIS — E210 Primary hyperparathyroidism: Secondary | ICD-10-CM

## 2022-10-12 ENCOUNTER — Encounter (HOSPITAL_COMMUNITY)
Admission: RE | Admit: 2022-10-12 | Discharge: 2022-10-12 | Disposition: A | Discharge status: Home / Self Care | Payer: Medicare HMO | Source: Ambulatory Visit | Attending: Nephrology | Admitting: Nephrology

## 2022-10-12 DIAGNOSIS — E210 Primary hyperparathyroidism: Secondary | ICD-10-CM | POA: Diagnosis present

## 2022-10-12 MED ORDER — TECHNETIUM TC 99M SESTAMIBI GENERIC - CARDIOLITE
24.9000 | Freq: Once | INTRAVENOUS | Status: AC | PRN
  Administered 2022-10-12: 24.9 via INTRAVENOUS

## 2022-10-17 ENCOUNTER — Other Ambulatory Visit: Payer: Self-pay

## 2022-10-17 ENCOUNTER — Observation Stay (HOSPITAL_COMMUNITY)
Admission: EM | Admit: 2022-10-17 | Discharge: 2022-10-20 | Disposition: A | Discharge status: Home / Self Care | Payer: Medicare HMO | Attending: Internal Medicine | Admitting: Internal Medicine

## 2022-10-17 ENCOUNTER — Emergency Department (HOSPITAL_COMMUNITY): Payer: Medicare HMO

## 2022-10-17 ENCOUNTER — Observation Stay (HOSPITAL_COMMUNITY): Payer: Medicare HMO

## 2022-10-17 ENCOUNTER — Encounter (HOSPITAL_COMMUNITY): Payer: Self-pay

## 2022-10-17 DIAGNOSIS — N186 End stage renal disease: Secondary | ICD-10-CM | POA: Diagnosis not present

## 2022-10-17 DIAGNOSIS — R195 Other fecal abnormalities: Secondary | ICD-10-CM

## 2022-10-17 DIAGNOSIS — E1122 Type 2 diabetes mellitus with diabetic chronic kidney disease: Secondary | ICD-10-CM | POA: Insufficient documentation

## 2022-10-17 DIAGNOSIS — R2689 Other abnormalities of gait and mobility: Secondary | ICD-10-CM | POA: Diagnosis not present

## 2022-10-17 DIAGNOSIS — R2681 Unsteadiness on feet: Secondary | ICD-10-CM | POA: Insufficient documentation

## 2022-10-17 DIAGNOSIS — F17210 Nicotine dependence, cigarettes, uncomplicated: Secondary | ICD-10-CM | POA: Insufficient documentation

## 2022-10-17 DIAGNOSIS — D1339 Benign neoplasm of other parts of small intestine: Secondary | ICD-10-CM | POA: Diagnosis not present

## 2022-10-17 DIAGNOSIS — K31819 Angiodysplasia of stomach and duodenum without bleeding: Secondary | ICD-10-CM | POA: Diagnosis not present

## 2022-10-17 DIAGNOSIS — R69 Illness, unspecified: Secondary | ICD-10-CM

## 2022-10-17 DIAGNOSIS — Z79899 Other long term (current) drug therapy: Secondary | ICD-10-CM | POA: Insufficient documentation

## 2022-10-17 DIAGNOSIS — K5520 Angiodysplasia of colon without hemorrhage: Secondary | ICD-10-CM | POA: Diagnosis not present

## 2022-10-17 DIAGNOSIS — K921 Melena: Secondary | ICD-10-CM | POA: Diagnosis not present

## 2022-10-17 DIAGNOSIS — D649 Anemia, unspecified: Secondary | ICD-10-CM | POA: Diagnosis not present

## 2022-10-17 DIAGNOSIS — I120 Hypertensive chronic kidney disease with stage 5 chronic kidney disease or end stage renal disease: Secondary | ICD-10-CM | POA: Insufficient documentation

## 2022-10-17 DIAGNOSIS — M6281 Muscle weakness (generalized): Secondary | ICD-10-CM | POA: Diagnosis not present

## 2022-10-17 DIAGNOSIS — D508 Other iron deficiency anemias: Secondary | ICD-10-CM | POA: Diagnosis not present

## 2022-10-17 DIAGNOSIS — D500 Iron deficiency anemia secondary to blood loss (chronic): Principal | ICD-10-CM | POA: Insufficient documentation

## 2022-10-17 DIAGNOSIS — D351 Benign neoplasm of parathyroid gland: Secondary | ICD-10-CM | POA: Insufficient documentation

## 2022-10-17 DIAGNOSIS — Z992 Dependence on renal dialysis: Secondary | ICD-10-CM

## 2022-10-17 LAB — COMPREHENSIVE METABOLIC PANEL
ALT: 10 U/L (ref 0–44)
AST: 14 U/L — ABNORMAL LOW (ref 15–41)
Albumin: 3.4 g/dL — ABNORMAL LOW (ref 3.5–5.0)
Alkaline Phosphatase: 812 U/L — ABNORMAL HIGH (ref 38–126)
Anion gap: 10 (ref 5–15)
BUN: 8 mg/dL (ref 8–23)
CO2: 29 mmol/L (ref 22–32)
Calcium: 9.2 mg/dL (ref 8.9–10.3)
Chloride: 98 mmol/L (ref 98–111)
Creatinine, Ser: 2.68 mg/dL — ABNORMAL HIGH (ref 0.44–1.00)
GFR, Estimated: 18 mL/min — ABNORMAL LOW (ref >60)
Glucose, Bld: 139 mg/dL — ABNORMAL HIGH (ref 70–99)
Potassium: 3.5 mmol/L (ref 3.5–5.1)
Sodium: 137 mmol/L (ref 135–145)
Total Bilirubin: 0.4 mg/dL (ref 0.3–1.2)
Total Protein: 6.1 g/dL — ABNORMAL LOW (ref 6.5–8.1)

## 2022-10-17 LAB — CBC
HCT: 25.5 % — ABNORMAL LOW (ref 36.0–46.0)
Hemoglobin: 8.3 g/dL — ABNORMAL LOW (ref 12.0–15.0)
MCH: 30.2 pg (ref 26.0–34.0)
MCHC: 32.5 g/dL (ref 30.0–36.0)
MCV: 92.7 fL (ref 80.0–100.0)
Platelets: 224 10*3/uL (ref 150–400)
RBC: 2.75 MIL/uL — ABNORMAL LOW (ref 3.87–5.11)
RDW: 15.6 % — ABNORMAL HIGH (ref 11.5–15.5)
WBC: 6.4 10*3/uL (ref 4.0–10.5)
nRBC: 0 % (ref 0.0–0.2)

## 2022-10-17 LAB — CBC WITH DIFFERENTIAL/PLATELET
Abs Immature Granulocytes: 0.02 10*3/uL (ref 0.00–0.07)
Basophils Absolute: 0 10*3/uL (ref 0.0–0.1)
Basophils Relative: 0 %
Eosinophils Absolute: 0.1 10*3/uL (ref 0.0–0.5)
Eosinophils Relative: 2 %
HCT: 21.2 % — ABNORMAL LOW (ref 36.0–46.0)
Hemoglobin: 6.4 g/dL — CL (ref 12.0–15.0)
Immature Granulocytes: 0 %
Lymphocytes Relative: 23 %
Lymphs Abs: 1.4 10*3/uL (ref 0.7–4.0)
MCH: 29.4 pg (ref 26.0–34.0)
MCHC: 30.2 g/dL (ref 30.0–36.0)
MCV: 97.2 fL (ref 80.0–100.0)
Monocytes Absolute: 0.5 10*3/uL (ref 0.1–1.0)
Monocytes Relative: 7 %
Neutro Abs: 4.2 10*3/uL (ref 1.7–7.7)
Neutrophils Relative %: 68 %
Platelets: 262 10*3/uL (ref 150–400)
RBC: 2.18 MIL/uL — ABNORMAL LOW (ref 3.87–5.11)
RDW: 15.8 % — ABNORMAL HIGH (ref 11.5–15.5)
WBC: 6.2 10*3/uL (ref 4.0–10.5)
nRBC: 0 % (ref 0.0–0.2)

## 2022-10-17 LAB — PREPARE RBC (CROSSMATCH)

## 2022-10-17 LAB — GLUCOSE, CAPILLARY
Glucose-Capillary: 90 mg/dL (ref 70–99)
Glucose-Capillary: 141 mg/dL — ABNORMAL HIGH (ref 70–99)

## 2022-10-17 LAB — GAMMA GT: GGT: 30 U/L (ref 7–50)

## 2022-10-17 LAB — IRON AND TIBC
Iron: 70 ug/dL (ref 28–170)
Saturation Ratios: 23 % (ref 10.4–31.8)
TIBC: 309 ug/dL (ref 250–450)
UIBC: 239 ug/dL

## 2022-10-17 LAB — FERRITIN: Ferritin: 323 ng/mL — ABNORMAL HIGH (ref 11–307)

## 2022-10-17 LAB — POC OCCULT BLOOD, ED: Fecal Occult Bld: POSITIVE — AB

## 2022-10-17 LAB — VITAMIN B12: Vitamin B-12: 188 pg/mL (ref 180–914)

## 2022-10-17 LAB — MRSA NEXT GEN BY PCR, NASAL: MRSA by PCR Next Gen: NOT DETECTED

## 2022-10-17 LAB — FOLATE: Folate: 4.4 ng/mL — ABNORMAL LOW (ref >5.9)

## 2022-10-17 MED ORDER — INSULIN ASPART 100 UNIT/ML IJ SOLN: 0.0000 [IU] | Freq: Three times a day (TID) | INTRAMUSCULAR | Status: DC

## 2022-10-17 MED ORDER — CARVEDILOL 25 MG PO TABS
25.0000 mg | ORAL_TABLET | Freq: Two times a day (BID) | ORAL | Status: DC
  Administered 2022-10-17 – 2022-10-19 (×5): 25 mg via ORAL
  Filled 2022-10-17 (×5): qty 1

## 2022-10-17 MED ORDER — ATORVASTATIN CALCIUM 80 MG PO TABS
80.0000 mg | ORAL_TABLET | Freq: Every day | ORAL | Status: DC
  Administered 2022-10-17 – 2022-10-19 (×3): 80 mg via ORAL
  Filled 2022-10-17: qty 2
  Filled 2022-10-17 (×2): qty 1

## 2022-10-17 MED ORDER — ACETAMINOPHEN 325 MG PO TABS
650.0000 mg | ORAL_TABLET | ORAL | Status: DC
  Filled 2022-10-17 (×3): qty 2

## 2022-10-17 MED ORDER — SODIUM CHLORIDE 0.9% IV SOLUTION: Freq: Once | INTRAVENOUS | Status: AC

## 2022-10-17 MED ORDER — SODIUM CHLORIDE 0.9 % IV SOLN: INTRAVENOUS | Status: DC

## 2022-10-17 MED ORDER — ORAL CARE MOUTH RINSE: 15.0000 mL | OROMUCOSAL | Status: DC | PRN

## 2022-10-17 MED ORDER — CAPSAICIN 0.025 % EX CREA
TOPICAL_CREAM | Freq: Three times a day (TID) | CUTANEOUS | Status: DC
  Filled 2022-10-17 (×2): qty 60
  Filled 2022-10-17 (×3): qty 57

## 2022-10-17 MED ORDER — FOLIC ACID 1 MG PO TABS
1.0000 mg | ORAL_TABLET | Freq: Every day | ORAL | Status: DC
  Administered 2022-10-17 – 2022-10-19 (×3): 1 mg via ORAL
  Filled 2022-10-17 (×4): qty 1

## 2022-10-17 MED ORDER — PANTOPRAZOLE SODIUM 40 MG PO TBEC
40.0000 mg | DELAYED_RELEASE_TABLET | Freq: Every day | ORAL | Status: DC
  Administered 2022-10-17 – 2022-10-19 (×3): 40 mg via ORAL
  Filled 2022-10-17 (×4): qty 1

## 2022-10-17 NOTE — Plan of Care (Signed)
Problem: Education: Goal: Knowledge of General Education information will improve Description: Including pain rating scale, medication(s)/side effects and non-pharmacologic comfort measures Outcome: Progressing   Problem: Clinical Measurements: Goal: Will remain free from infection Outcome: Progressing   Problem: Nutrition: Goal: Adequate nutrition will be maintained Outcome: Progressing

## 2022-10-17 NOTE — Plan of Care (Signed)

## 2022-10-17 NOTE — Progress Notes (Signed)
PT Cancellation Note  Patient Details Name: Sara Davis MRN: [id number] DOB: 1950-10-12   Cancelled Treatment:    Reason Eval/Treat Not Completed: Fatigue/lethargy limiting ability to participate this afternoon. States she is too fatigued to get out of bed again since she woke up early this morning for dialysis. Will continue to follow and evaluate as able.   Vickki Muff, PT, DPT   Acute Rehabilitation Department Office [phone] Secure Chat Communication Preferred   Ronnie Derby 10/17/2022, 4:10 PM

## 2022-10-17 NOTE — ED Triage Notes (Signed)
Pt arrives via POV. Pt reports she was sent to the ED from dialysis. She states she was told her HBG was less than 7. PT reports some intermittent sob. Pt is AxOx4.

## 2022-10-17 NOTE — ED Notes (Signed)
ED TO INPATIENT HANDOFF REPORT  ED Nurse Name and Phone #: Marchelle Folks RN   S Name/Age/Gender Sara Davis 72 y.o. female Room/Bed: 024C/024C  Code Status   Code Status: Full Code  Home/SNF/Other Home Patient oriented to: self, place, time, and situation Is this baseline? Yes   Triage Complete: Triage complete  Chief Complaint Acute on chronic anemia [D64.9]  Triage Note Pt arrives via POV. Pt reports she was sent to the ED from dialysis. She states she was told her HBG was less than 7. PT reports some intermittent sob. Pt is AxOx4.    Allergies Allergies  Allergen Reactions   Aspirin Other (See Comments)    Upset stomach, can only take coated aspirin but prefers to not take it at all    Level of Care/Admitting Diagnosis ED Disposition     ED Disposition  Admit   Condition  --   Comment  Hospital Area: [facility] [[id number]]  Level of Care: Med-Surg [16]  May place patient in observation at [facility] or [facility] if equivalent level of care is available:: No  Covid Evaluation: Asymptomatic - no recent exposure (last 10 days) testing not required  Diagnosis: Acute on chronic anemia [1610960]  Admitting Physician: Mercie Eon [[id number]]  Attending Physician: Mercie Eon [[id number]]          B Medical/Surgery History Past Medical History:  Diagnosis Date   Anemia    Cellulitis of scalp 02/2015   Chronic kidney disease 01/20/2021   stage V   Depression    Diabetes mellitus without complication (HCC)    History of blood transfusion    Hyperlipidemia    Hypertension    Past Surgical History:  Procedure Laterality Date   ABDOMINAL HYSTERECTOMY     AV FISTULA PLACEMENT Right 02/05/2021   Procedure: INSERTION OF RIGHT ARM ARTERIOVENOUS (AV) GORE-TEX GRAFT;  Surgeon: Leonie Douglas, MD;  Location: [facility] OR;  Service: Vascular;  Laterality: Right;   BIOPSY  01/07/2018   Procedure: BIOPSY;  Surgeon: Hilarie Fredrickson, MD;  Location: [facility] ENDOSCOPY;   Service: Endoscopy;;  Clotest   COLONOSCOPY WITH PROPOFOL N/A 01/07/2018   Procedure: COLONOSCOPY WITH PROPOFOL;  Surgeon: Hilarie Fredrickson, MD;  Location: [facility] ENDOSCOPY;  Service: Endoscopy;  Laterality: N/A;   ENTEROSCOPY N/A 06/17/2022   Procedure: ENTEROSCOPY;  Surgeon: Sherrilyn Rist, MD;  Location: [facility] ENDOSCOPY;  Service: Gastroenterology;  Laterality: N/A;   ESOPHAGOGASTRODUODENOSCOPY (EGD) WITH PROPOFOL N/A 01/07/2018   Procedure: ESOPHAGOGASTRODUODENOSCOPY (EGD) WITH PROPOFOL;  Surgeon: Hilarie Fredrickson, MD;  Location: [facility] ENDOSCOPY;  Service: Endoscopy;  Laterality: N/A;   ESOPHAGOGASTRODUODENOSCOPY (EGD) WITH PROPOFOL N/A 12/14/2018   Procedure: ESOPHAGOGASTRODUODENOSCOPY (EGD) WITH PROPOFOL;  Surgeon: Hilarie Fredrickson, MD;  Location: [facility] ENDOSCOPY;  Service: Endoscopy;  Laterality: N/A;   HEMOSTASIS CLIP PLACEMENT  06/17/2022   Procedure: HEMOSTASIS CLIP PLACEMENT;  Surgeon: Sherrilyn Rist, MD;  Location: [facility] ENDOSCOPY;  Service: Gastroenterology;;   HEMOSTASIS CONTROL  12/14/2018   Procedure: HEMOSTASIS CONTROL;  Surgeon: Hilarie Fredrickson, MD;  Location: [facility] ENDOSCOPY;  Service: Endoscopy;;   HOT HEMOSTASIS N/A 06/17/2022   Procedure: HOT HEMOSTASIS (ARGON PLASMA COAGULATION/BICAP);  Surgeon: Sherrilyn Rist, MD;  Location: [facility] ENDOSCOPY;  Service: Gastroenterology;  Laterality: N/A;   POLYPECTOMY  01/07/2018   Procedure: POLYPECTOMY;  Surgeon: Hilarie Fredrickson, MD;  Location: [facility] ENDOSCOPY;  Service: Endoscopy;;   SCLEROTHERAPY  06/17/2022   Procedure: Susa Day;  Surgeon: Sherrilyn Rist, MD;  Location: [facility] ENDOSCOPY;  Service: Gastroenterology;;     A IV Location/Drains/Wounds Patient Lines/Drains/Airways Status     Active Line/Drains/Airways     Name Placement date Placement time Site Days   Peripheral IV 10/17/22 20 G Anterior;Left;Proximal Forearm 10/17/22  0952  Forearm  less than 1   Fistula / Graft Right Upper arm Arteriovenous vein graft 02/05/21  1441  Upper arm  619             Intake/Output Last 24 hours  Intake/Output Summary (Last 24 hours) at 10/17/2022 1331 Last data filed at 10/17/2022 1210 Gross per 24 hour  Intake 375 ml  Output --  Net 375 ml    Labs/Imaging Results for orders placed or performed during the hospital encounter of 10/17/22 (from the past 48 hour(s))  CBC with Differential     Status: Abnormal   Collection Time: 10/17/22  8:10 AM  Result Value Ref Range   WBC 6.2 4.0 - 10.5 K/uL   RBC 2.18 (L) 3.87 - 5.11 MIL/uL   Hemoglobin 6.4 (LL) 12.0 - 15.0 g/dL    Comment: REPEATED TO VERIFY THIS CRITICAL RESULT HAS VERIFIED AND BEEN CALLED TO Moriah Loughry, RN BY [employer] ON 09 14 2024 AT 0852, AND HAS BEEN READ BACK.     HCT 21.2 (L) 36.0 - 46.0 %   MCV 97.2 80.0 - 100.0 fL   MCH 29.4 26.0 - 34.0 pg   MCHC 30.2 30.0 - 36.0 g/dL   RDW 14.7 (H) 82.9 - 56.2 %   Platelets 262 150 - 400 K/uL   nRBC 0.0 0.0 - 0.2 %   Neutrophils Relative % 68 %   Neutro Abs 4.2 1.7 - 7.7 K/uL   Lymphocytes Relative 23 %   Lymphs Abs 1.4 0.7 - 4.0 K/uL   Monocytes Relative 7 %   Monocytes Absolute 0.5 0.1 - 1.0 K/uL   Eosinophils Relative 2 %   Eosinophils Absolute 0.1 0.0 - 0.5 K/uL   Basophils Relative 0 %   Basophils Absolute 0.0 0.0 - 0.1 K/uL   Immature Granulocytes 0 %   Abs Immature Granulocytes 0.02 0.00 - 0.07 K/uL    Comment: Performed at [facility], [address], [place], [place] [address]  Comprehensive metabolic panel     Status: Abnormal   Collection Time: 10/17/22  8:10 AM  Result Value Ref Range   Sodium 137 135 - 145 mmol/L   Potassium 3.5 3.5 - 5.1 mmol/L   Chloride 98 98 - 111 mmol/L   CO2 29 22 - 32 mmol/L   Glucose, Bld 139 (H) 70 - 99 mg/dL    Comment: Glucose reference range applies only to samples taken after fasting for at least 8 hours.   BUN 8 8 - 23 mg/dL   Creatinine, Ser 5.78 (H) 0.44 - 1.00 mg/dL   Calcium 9.2 8.9 - 46.9 mg/dL   Total Protein 6.1 (L) 6.5 - 8.1 g/dL   Albumin 3.4 (L)  3.5 - 5.0 g/dL   AST 14 (L) 15 - 41 U/L   ALT 10 0 - 44 U/L   Alkaline Phosphatase 812 (H) 38 - 126 U/L   Total Bilirubin 0.4 0.3 - 1.2 mg/dL   GFR, Estimated 18 (L) >60 mL/min    Comment: (NOTE) Calculated using the CKD-EPI Creatinine Equation (2021)    Anion gap 10 5 - 15    Comment: Performed at [facility], [address], [place], [place] [address]  Type and screen [facility]     Status: None (Preliminary result)   Collection Time: 10/17/22  8:20 AM  Result Value Ref Range   ABO/RH(D) O POS    Antibody Screen NEG    Sample Expiration 10/20/2022,2359    Unit Number [id number]    Blood Component Type RBC LR PHER1    Unit division 00    Status of Unit ISSUED    Transfusion Status OK TO TRANSFUSE    Crossmatch Result Compatible    Unit Number [id number]    Blood Component Type RED CELLS,LR    Unit division 00    Status of Unit ISSUED    Transfusion Status OK TO TRANSFUSE    Crossmatch Result      Compatible Performed at [facility], [address], [place], [place] [address]   Prepare RBC (crossmatch)     Status: None   Collection Time: 10/17/22  9:30 AM  Result Value Ref Range   Order Confirmation      ORDER PROCESSED BY BLOOD BANK Performed at [facility], [address], [place], [place] [address]   POC occult blood, ED Provider will collect     Status: Abnormal   Collection Time: 10/17/22 11:20 AM  Result Value Ref Range   Fecal Occult Bld POSITIVE (A) NEGATIVE   DG Chest 2 View  Result Date: 10/17/2022 CLINICAL DATA:  Shortness of breath. Patient presented to the emergency department from dialysis after she was told her hemoglobin was too low. EXAM: CHEST - 2 VIEW COMPARISON:  06/15/2022 FINDINGS: Heart size is normal. Low lung volumes. Chronic coarsened interstitial markings. No pleural fluid, airspace consolidation or frank interstitial edema. No bone abnormalities. IMPRESSION: 1. No acute findings. 2. Chronic  interstitial lung disease. Electronically Signed   By: Signa Kell M.D.   On: 10/17/2022 08:52    Pending Labs Unresulted Labs (From admission, onward)     Start     Ordered   10/17/22 1244  Gamma GT  Add-on,   AD        10/17/22 1243   10/17/22 1243  Hemoglobin A1c  (Glycemic Control (SSI)  Q 4 Hours / Glycemic Control (SSI)  AC +/- HS)  Once,   R       Comments: To assess prior glycemic control    10/17/22 1242            Vitals/Pain Today's Vitals   10/17/22 1200 10/17/22 1210 10/17/22 1227 10/17/22 1245  BP: (!) 125/110 (!) 153/101 (!) 160/66 (!) 160/68  Pulse: 74 75 73 71  Resp: 19 14 14 14   Temp:  98.1 F (36.7 C) 97.6 F (36.4 C) 98.3 F (36.8 C)  TempSrc:  Oral Oral Oral  SpO2: 100% 98%  100%  Weight:      Height:      PainSc:        Isolation Precautions No active isolations  Medications Medications  insulin aspart (novoLOG) injection 0-9 Units (has no administration in time range)  atorvastatin (LIPITOR) tablet 80 mg (has no administration in time range)  carvedilol (COREG) tablet 25 mg (has no administration in time range)  pantoprazole (PROTONIX) EC tablet 40 mg (has no administration in time range)  capsicum (ZOSTRIX) 0.075 % cream (has no administration in time range)  0.9 %  sodium chloride infusion (Manually program via Guardrails IV Fluids) ( Intravenous New Bag/Given 10/17/22 0959)    Mobility walks  Focused Assessments Renal Assessment Handoff:  Hemodialysis Schedule: Hemodialysis Schedule: Tuesday/Thursday/Saturday Last Hemodialysis date and time: today   Restricted appendage: left arm   R Recommendations: See Admitting Provider Note  Report given to:   Additional Notes: pt is currently receiving 2nd unit of blood

## 2022-10-17 NOTE — H&P (Signed)
Date: 10/17/2022               Patient Name:  Sara Davis MRN: [id number]  DOB: [birth year] Age / Sex: 72 y.o., female   PCP: Loura Back, NP         Medical Service: Internal Medicine Teaching Service         Attending Physician: Dr. Mercie Eon, MD      First Contact: Dr. Meryl Dare, MD Pager, [phone]    Second Contact: Dr. [name], MD Pager [phone]         After Hours (After 5p/  First Contact Pager: [phone]  weekends / holidays): Second Contact Pager: [phone]   SUBJECTIVE   Chief Complaint: asymptomatic anemia Hgb<7  History of Present Illness:  Patient medical history significant for chronic anemia, type 2 diabetes mellitus, depression, hypertension, dyslipidemia and end-stage renal disease on hemodialysis who is presenting to ED after finding Hgb 6.4 (10.1 four month ago). She was 2 hours into dialysis session when was stopped due to abnormal value.   She reports that she is not experiencing any fatigue, weakness, and that if she didn't know her Hgb that she would have no idea that she was severely anemic. She reports abdominal cramping but only when she is cold or on dialysis. She denies any abdominal pain. Regarding her stool, she takes oral iron so her stool is typically dark so it is difficult for her to decipher if she has blood or melena, although she says it was light over the last day. She denies using ibuprofen or aspirin. When eating or post prandial, she reports no abdominal pain or nausea. She denies signs of jaundice in past.   Her only concern currently is arthritis in her joints. She reports that she has difficulty walking due to the pain. She reports it is worse in the morning and improves with movement. Through the day, it does get worse and pain migrates up her thigh. For pain, she takes tylenol 650 x4 day.   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  Review of Systems  Constitutional:  Negative for chills, fever and weight loss.  HENT:  Negative for nosebleeds.   Gastrointestinal:  Negative for abdominal pain, blood in stool, heartburn, melena, nausea and vomiting.    ED Course: Patient given 2 units of blood in the ED.   Past Medical History Past Medical History:  Diagnosis Date   Anemia    Cellulitis of scalp 02/2015   Chronic kidney disease 01/20/2021   stage V   Depression    Diabetes mellitus without complication (HCC)    History of blood transfusion    Hyperlipidemia    Hypertension      Meds:  No outpatient medications have been marked as taking for the 10/17/22 encounter [facility] Encounter).    Past Surgical History  Past Surgical History:  Procedure Laterality Date   ABDOMINAL HYSTERECTOMY     AV FISTULA PLACEMENT Right 02/05/2021   Procedure: INSERTION OF RIGHT ARM ARTERIOVENOUS (AV) GORE-TEX GRAFT;  Surgeon: Leonie Douglas, MD;  Location: [facility] OR;  Service: Vascular;  Laterality: Right;   BIOPSY  01/07/2018   Procedure: BIOPSY;  Surgeon: Hilarie Fredrickson, MD;  Location: [facility] ENDOSCOPY;  Service: Endoscopy;;  Clotest   COLONOSCOPY WITH PROPOFOL N/A 01/07/2018   Procedure: COLONOSCOPY WITH PROPOFOL;  Surgeon: Yancey Flemings  N, MD;  Location: [facility] ENDOSCOPY;  Service: Endoscopy;  Laterality: N/A;   ENTEROSCOPY N/A 06/17/2022   Procedure: ENTEROSCOPY;  Surgeon: Sherrilyn Rist, MD;  Location: [facility] ENDOSCOPY;  Service: Gastroenterology;  Laterality: N/A;   ESOPHAGOGASTRODUODENOSCOPY (EGD) WITH PROPOFOL N/A 01/07/2018   Procedure: ESOPHAGOGASTRODUODENOSCOPY (EGD) WITH PROPOFOL;  Surgeon: Hilarie Fredrickson, MD;  Location[facility] ENDOSCOPY;  Service: Endoscopy;  Laterality: N/A;   ESOPHAGOGASTRODUODENOSCOPY (EGD) WITH PROPOFOL N/A 12/14/2018   Procedure: ESOPHAGOGASTRODUODENOSCOPY (EGD) WITH PROPOFOL;  Surgeon: Hilarie Fredrickson, MD;  Location: [facility] ENDOSCOPY;  Service: Endoscopy;  Laterality: N/A;    HEMOSTASIS CLIP PLACEMENT  06/17/2022   Procedure: HEMOSTASIS CLIP PLACEMENT;  Surgeon: Sherrilyn Rist, MD;  Location: [facility] ENDOSCOPY;  Service: Gastroenterology;;   HEMOSTASIS CONTROL  12/14/2018   Procedure: HEMOSTASIS CONTROL;  Surgeon: Hilarie Fredrickson, MD;  Location: [facility] ENDOSCOPY;  Service: Endoscopy;;   HOT HEMOSTASIS N/A 06/17/2022   Procedure: HOT HEMOSTASIS (ARGON PLASMA COAGULATION/BICAP);  Surgeon: Sherrilyn Rist, MD;  Location: [facility] ENDOSCOPY;  Service: Gastroenterology;  Laterality: N/A;   POLYPECTOMY  01/07/2018   Procedure: POLYPECTOMY;  Surgeon: Hilarie Fredrickson, MD;  Location: [facility] ENDOSCOPY;  Service: Endoscopy;;   SCLEROTHERAPY  06/17/2022   Procedure: Susa Day;  Surgeon: Sherrilyn Rist, MD;  Location: [facility] ENDOSCOPY;  Service: Gastroenterology;;    Social:  Lives With: lives with son who is at bedside and support Occupation: retired Support: son  Level of Function: ADL, iADL w/ some support PCP: Loura Back NP Substances: denies alcohol or other substance. 1/2 pack 50 years.   Family History:  Denies family history of GI bleeding disorder. Endorses HTN, diabetes, and bone cancer in first degree relatives.   Allergies: Allergies as of 10/17/2022 - Review Complete 10/17/2022  Allergen Reaction Noted   Aspirin Other (See Comments) 01/14/2012    Review of Systems: A complete ROS was negative except as per HPI.   OBJECTIVE:   Physical Exam: Blood pressure (!) 163/65, pulse 74, temperature 97.7 F (36.5 C), temperature source Oral, resp. rate 16, height 5\' 6"  (1.676 m), weight 54 kg, SpO2 100%.  Constitutional: well-appearing, in no acute distress HENT: normocephalic atraumatic, mucous membranes moist Eyes: conjunctiva non-erythematous Neck: supple Cardiovascular: regular rate and rhythm, no m/r/g Pulmonary/Chest: normal work of breathing on room air, lungs clear to auscultation bilaterally Abdominal: soft, non-tender, non-distended MSK: normal bulk and  tone Neurological: alert & oriented x 3, 5/5 strength in bilateral upper and lower extremities, normal gait Skin: warm and dry Psych: affect euthymic. Behavior appropriate.   Labs: Outpatient Hgb trends: 6/27 - Hgb 11 7/4 - 9.7 7/11 - 9.4 7/18 - 9.4 7/25 - 9.3 - Mircera given - ?dose 8/1 - 9.3 8/8 - 8.2 - Mircera given 8/15 - 7.2 8/22 - 7.8 - Mircera given 8/29 - 8 9/5 - 7.1 - Mircera given 9/12 - 6.2 Additionally, has been getting IV Venofer 50mg  weekly.  CBC    Component Value Date/Time   WBC 6.2 10/17/2022 0810   RBC 2.18 (L) 10/17/2022 0810   HGB 6.4 (LL) 10/17/2022 0810   HGB 10.5 (L) 05/09/2019 1139   HCT 21.2 (L) 10/17/2022 0810   HCT 31.8 (L) 05/09/2019 1139   PLT 262 10/17/2022 0810   PLT 347 05/09/2019 1139   MCV 97.2 10/17/2022 0810   MCV 85 05/09/2019 1139   MCH 29.4 10/17/2022 0810   MCHC 30.2 10/17/2022 0810   RDW 15.8 (H) 10/17/2022 0810   RDW  12.6 05/09/2019 1139   LYMPHSABS 1.4 10/17/2022 0810   LYMPHSABS 1.7 05/09/2019 1139   MONOABS 0.5 10/17/2022 0810   EOSABS 0.1 10/17/2022 0810   EOSABS 0.1 05/09/2019 1139   BASOSABS 0.0 10/17/2022 0810   BASOSABS 0.0 05/09/2019 1139     CMP     Component Value Date/Time   NA 137 10/17/2022 0810   NA 142 05/09/2019 1139   K 3.5 10/17/2022 0810   CL 98 10/17/2022 0810   CO2 29 10/17/2022 0810   GLUCOSE 139 (H) 10/17/2022 0810   BUN 8 10/17/2022 0810   BUN 23 05/09/2019 1139   CREATININE 2.68 (H) 10/17/2022 0810   CREATININE 3.53 (HH) 12/23/2018 1110   CREATININE 0.75 07/31/2013 1059   CALCIUM 9.2 10/17/2022 0810   PROT 6.1 (L) 10/17/2022 0810   PROT 6.3 05/09/2019 1139   ALBUMIN 3.4 (L) 10/17/2022 0810   ALBUMIN 4.5 05/09/2019 1139   AST 14 (L) 10/17/2022 0810   AST 12 (L) 12/23/2018 1110   ALT 10 10/17/2022 0810   ALT 9 12/23/2018 1110   ALKPHOS 812 (H) 10/17/2022 0810   BILITOT 0.4 10/17/2022 0810   BILITOT 0.2 05/09/2019 1139   BILITOT 0.3 12/23/2018 1110   GFRNONAA  18 (L) 10/17/2022 0810   GFRNONAA 13 (L) 12/23/2018 1110   GFRAA 18 (L) 05/09/2019 1139   GFRAA 15 (L) 12/23/2018 1110   Imaging:  CXR: no acute findings, chronic ILD EKG: personally reviewed my interpretation is NSR, no acute abnormalities, no ST changes.   ASSESSMENT & PLAN:   Assessment & Plan by Problem: Principal Problem:   Acute on chronic anemia   Patient medical history significant for chronic anemia, type 2 diabetes mellitus, depression, hypertension, dyslipidemia and end-stage renal disease on hemodialysis who presents with asymptomatic anemia with subacute hgb downtrending 6.4.  #Acute on chronic normocytic anemia #History No clear bleeding source, subacute, positive FOBT Ddx: GI blood loss, ESRD-related anemia, iron deficiency anemia, medication-induced, B12/folate deficiency, anemia of chronic disease, hemolysis GI following Enteroscopy pending If enteroscopy, negative then capsule endoscopy Iron, TIBC, ferritin pending B12, folate pending Continue oral PPI  #Elevated alkaline phosphatase  350(53m ago)->800, no jaundice or apparent cholestasis signs Ddx: bone disorder, extrahepatic cholestasis, intrahepatic cholestasis GI following GGT 30 - suggestive of bone origin Fractionated alkaline phosphatase pending RUQ [place] pending  #ESRD on dialysis T/Th/Sat Nephrology following Plan for usual HD on Tuesday, unless otherwise indicated  #Bilateral Knee pain #Osteopenia #Deconditioning Recent DEXA suggests osteopenia. Pt and family report deconditioning secondary to sedentary lifestyle and joint pain.  PT consult per pt and family request Follow up with primary care physician for osteopenia tx and join pain tx (likely OA) Started capsicin cream for bilateral knees three times daily Started acetaminophen 650 mg once every 4 hours while awake per pt home regimen  #T2DM CBG monitoring and SSI  #HTN Hypertensive since presentation Restarted coreg 25 mg Does not  appear to be taking diltiazem 120  #Hyperlipidemia Restarted atorvastatin  #GERD Restarted pantoprazole  Diet: clear fluids today, NPO (except sips) tomorrow VTE: holding until bleeding ruled out IVF: received 2 units PRBC, no other fluids Code: Full  Prior to Admission Living Arrangement: Home, living with son who is at bedside Anticipated Discharge Location: Home Barriers to Discharge: determine source of anemia  Dispo: Admit patient to Observation with expected length of stay less than 2 midnights.  Signed: Meryl Dare, MD PGY-1 Psychiatry Resident 10/17/2022, 3:16 PM

## 2022-10-17 NOTE — Consult Note (Signed)
Consultation  Referring Provider:   ER Primary Care Physician:  Loura Back, NP Primary Gastroenterologist:  [facility]       Reason for Consultation:    Anemia and Hemi positive stool DOA: 10/17/2022         Hospital Day: 1         HPI:   Sara Davis is a 72 y.o. female with past medical history significant for type 2 diabetes, hypertension, hyperlipidemia, end-stage renal disease on dialysis Tuesday Thursday Saturday, IDA, small bowel AVMs, adenomatous colon polyps, depression.   5/14 through 5/15 admission for fatigue and dark stools. Hgb 6.5 status post 2 PRBC Hgb 9.9 at that time alk phos 356 with normal bilirubin AST and ALT. 5/15 enteroscopy with AVMs in the duodenum status post APC injected and clipped   Presents to the ER from dialysis secondary to low hemoglobin, patient asymptomatic, no overt GI bleeding. Has been having escalation of her ESA dose, next dose 9/19.  Had half of her dialysis this morning. Work up notable for Hgb 6.4 status post 1 unit PRBC pending 1 other albumin 3.4, AST 14, alkaline phosphatase 812,  GGT 30 and not elevated.   Unremarkable ALT, total bilirubin. Potassium 3.5, BUN 8, creatinine 2.68 FOBT positive  Per patient and her son Jeannett Senior, patient had an outpatient colonoscopy with [facility] about a month ago, she did have some polyps recall was 5 years.  I am unable to see this report. Patient denies overt GI bleeding, has had some darker stools but states she is on the oral iron. Has achiness and fatigue but overall feeling well. Denies change in bowel habits, nausea vomiting, abdominal pain. Mildly shortness of breath at times, no chest pain. States overall she feels fairly well.  Denies NSAIDs, alcohol, drug use, smoking. No family was present at the time of my evaluation.  Previous GI Evaluation:  10/2022 colonoscopy at [facility] I do not have this report but patient states she had polyps recall 5  years. 06/17/2022 enteroscopy Dr. Myrtie Neither for FOBT positive, IDA mucosal changes esophagus, normal stomach, nonbleeding AVMs in the duodenum status post APC, injected and clip placed. 01/07/2018 EGD for iron deficiency anemia Dr. 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 Marina Goodell: 3 4-8 mm polyps in sigmoid colon and in the transverse colon, otherwise normal, pathology was a mix of hyperplastic and tubular adenomatous recall recommended in 5 years based on surveillance guidelines at that time. Colon, polyp(s), transverse and sigmoid - TUBULAR ADENOMA(S). - HYPERPLASTIC POLYP(S). - HIGH GRADE DYSPLASIA IS NOT IDENTIFIED   Abnormal ED labs: Abnormal Labs Reviewed  CBC WITH DIFFERENTIAL/PLATELET - Abnormal; Notable for the following components:      Result Value   RBC 2.18 (*)    Hemoglobin 6.4 (*)    HCT 21.2 (*)    RDW 15.8 (*)    All other components within normal limits  COMPREHENSIVE METABOLIC PANEL - Abnormal; Notable for the following components:   Glucose, Bld 139 (*)    Creatinine, Ser 2.68 (*)    Total Protein 6.1 (*)    Albumin 3.4 (*)    AST 14 (*)    Alkaline Phosphatase 812 (*)    GFR, Estimated 18 (*)    All other components within normal limits  POC OCCULT BLOOD, ED -  Abnormal; Notable for the following components:   Fecal Occult Bld POSITIVE (*)    All other components within normal limits    Past Medical History:  Diagnosis Date   Anemia    Cellulitis of scalp 02/2015   Chronic kidney disease 01/20/2021   stage V   Depression    Diabetes mellitus without complication (HCC)    History of blood transfusion    Hyperlipidemia    Hypertension     Surgical History:  She  has a past surgical history that includes Abdominal hysterectomy; Colonoscopy with propofol (N/A,  01/07/2018); Esophagogastroduodenoscopy (egd) with propofol (N/A, 01/07/2018); polypectomy (01/07/2018); biopsy (01/07/2018); Esophagogastroduodenoscopy (egd) with propofol (N/A, 12/14/2018); Hemostasis control (12/14/2018); AV fistula placement (Right, 02/05/2021); enteroscopy (N/A, 06/17/2022); Hot hemostasis (N/A, 06/17/2022); Sclerotherapy (06/17/2022); and Hemostasis clip placement (06/17/2022). Family History:  Her family history includes Cancer in her mother; Cancer (age of onset: 43) in her sister; Diabetes in her brother, mother, sister, sister, and sister; Hypertension in her sister. Social History:   reports that she has been smoking cigarettes. She has a 26.5 pack-year smoking history. She has never used smokeless tobacco. She reports that she does not drink alcohol and does not use drugs.  Prior to Admission medications   Medication Sig Start Date End Date Taking? Authorizing Provider  Alcohol Swabs (B-D SINGLE USE SWABS REGULAR) PADS 1 Units by Does not apply route 3 (three) times daily as needed. 07/14/18   [name], [place], DO  atorvastatin (LIPITOR) 80 MG tablet Take 1 tablet (80 mg total) by mouth daily. 12/20/18   Melene Plan, MD  AURYXIA 1 GM 210 MG(Fe) tablet Take 420 mg by mouth 3 (three) times daily. 04/12/22   [provider]  B Complex-C-Folic Acid (DIALYVITE TABLET) TABS Take 1 tablet by mouth daily. 03/05/22   [provider]  calcitRIOL (ROCALTROL) 0.25 MCG capsule Take 0.25 mcg by mouth daily. Patient not taking: Reported on 06/16/2022 01/29/21   [provider]  Calcium Carbonate (CALCIUM 600 PO) Take 600 mg by mouth 2 (two) times daily. Patient not taking: Reported on 06/16/2022    [provider]  carvedilol (COREG) 25 MG tablet Take 1 tablet (25 mg total) by mouth 2 (two) times daily with a meal. 05/09/19   Royal Hawthorn, NP  cinacalcet (SENSIPAR) 60 MG tablet Take 180 mg by mouth daily. 04/21/22   [provider]  diltiazem  (CARDIZEM SR) 120 MG 12 hr capsule Take 1 capsule (120 mg total) by mouth 2 (two) times daily. 06/17/22 07/17/22  Pokhrel, Rebekah Chesterfield, MD  glucose blood (ACCU-CHEK AVIVA PLUS) test strip Use as instructed 07/14/18   [name], [place], DO  Lancets (ACCU-CHEK SOFT TOUCH) lancets Use as instructed 07/14/18   [name], [place], DO  pantoprazole (PROTONIX) 40 MG tablet Take 1 tablet (40 mg total) by mouth daily for 28 days. 06/18/22 07/16/22  Pokhrel, Rebekah Chesterfield, MD    Current Facility-Administered Medications  Medication Dose Route Frequency Provider Last Rate Last Admin   atorvastatin (LIPITOR) tablet 80 mg  80 mg Oral Daily [name], MD       capsicum (ZOSTRIX) 0.075 % cream   Topical TID Meryl Dare, MD       carvedilol (COREG) tablet 25 mg  25 mg Oral BID WC [name], MD       insulin aspart (novoLOG) injection 0-9 Units  0-9 Units Subcutaneous TID WC [name], MD       pantoprazole (PROTONIX) EC tablet 40 mg  40  mg Oral Daily [name], MD       Current Outpatient Medications  Medication Sig Dispense Refill   Alcohol Swabs (B-D SINGLE USE SWABS REGULAR) PADS 1 Units by Does not apply route 3 (three) times daily as needed. 90 each 2   atorvastatin (LIPITOR) 80 MG tablet Take 1 tablet (80 mg total) by mouth daily. 90 tablet 1   AURYXIA 1 GM 210 MG(Fe) tablet Take 420 mg by mouth 3 (three) times daily.     B Complex-C-Folic Acid (DIALYVITE TABLET) TABS Take 1 tablet by mouth daily.     calcitRIOL (ROCALTROL) 0.25 MCG capsule Take 0.25 mcg by mouth daily. (Patient not taking: Reported on 06/16/2022)     Calcium Carbonate (CALCIUM 600 PO) Take 600 mg by mouth 2 (two) times daily. (Patient not taking: Reported on 06/16/2022)     carvedilol (COREG) 25 MG tablet Take 1 tablet (25 mg total) by mouth 2 (two) times daily with a meal. 180 tablet 3   cinacalcet (SENSIPAR) 60 MG tablet Take 180 mg by mouth daily.     diltiazem (CARDIZEM SR) 120 MG 12 hr capsule Take 1 capsule (120 mg  total) by mouth 2 (two) times daily. 60 capsule 0   glucose blood (ACCU-CHEK AVIVA PLUS) test strip Use as instructed 100 each 12   Lancets (ACCU-CHEK SOFT TOUCH) lancets Use as instructed 100 each 12   pantoprazole (PROTONIX) 40 MG tablet Take 1 tablet (40 mg total) by mouth daily for 28 days. 28 tablet 0    Allergies as of 10/17/2022 - Review Complete 10/17/2022  Allergen Reaction Noted   Aspirin Other (See Comments) 01/14/2012    Review of Systems:    Constitutional: No weight loss, fever, chills, weakness or fatigue HEENT: Eyes: No change in vision               Ears, Nose, Throat:  No change in hearing or congestion Skin: No rash or itching Cardiovascular: No chest pain, chest pressure or palpitations   Respiratory: No SOB or cough Gastrointestinal: See HPI and otherwise negative Genitourinary: No dysuria or change in urinary frequency Neurological: No headache, dizziness or syncope Musculoskeletal: No new muscle or joint pain Hematologic: No bleeding or bruising Psychiatric: No history of depression or anxiety     Physical Exam:  Vital signs in last 24 hours: Temp:  [97.6 F (36.4 C)-98.3 F (36.8 C)] 98.3 F (36.8 C) (09/14 1245) Pulse Rate:  [69-78] 71 (09/14 1245) Resp:  [13-20] 14 (09/14 1245) BP: (124-163)/(51-110) 160/68 (09/14 1245) SpO2:  [98 %-100 %] 100 % (09/14 1245) Weight:  [54 kg] 54 kg (09/14 0807)   Last BM recorded by nurses in past 5 days No data recorded  General:   Thin AA female female in no acute distress Head:  Normocephalic and atraumatic. Eyes: sclerae anicteric,conjunctive pale  Heart:  regular rate and rhythm, no murmurs or gallops Pulm: Clear anteriorly; no wheezing Abdomen:  Soft, Obese AB, Active bowel sounds. No tenderness . Without guarding and Without rebound, No organomegaly appreciated. Extremities:  Without edema.  Fistula right upper arm with thrill. Msk:  Symmetrical without gross deformities. Peripheral pulses intact.   Neurologic:  Alert and  oriented x4;  No focal deficits.  Skin:   Dry and intact without significant lesions or rashes. Psychiatric:  Cooperative. Normal mood and affect.  LAB RESULTS: Recent Labs    10/17/22 0810  WBC 6.2  HGB 6.4*  HCT 21.2*  PLT 262   BMET  Recent Labs    10/17/22 0810  NA 137  K 3.5  CL 98  CO2 29  GLUCOSE 139*  BUN 8  CREATININE 2.68*  CALCIUM 9.2   LFT Recent Labs    10/17/22 0810  PROT 6.1*  ALBUMIN 3.4*  AST 14*  ALT 10  ALKPHOS 812*  BILITOT 0.4   PT/INR No results for input(s): "LABPROT", "INR" in the last 72 hours.  STUDIES: DG Chest 2 View  Result Date: 10/17/2022 CLINICAL DATA:  Shortness of breath. Patient presented to the emergency department from dialysis after she was told her hemoglobin was too low. EXAM: CHEST - 2 VIEW COMPARISON:  06/15/2022 FINDINGS: Heart size is normal. Low lung volumes. Chronic coarsened interstitial markings. No pleural fluid, airspace consolidation or frank interstitial edema. No bone abnormalities. IMPRESSION: 1. No acute findings. 2. Chronic interstitial lung disease. Electronically Signed   By: Signa Kell M.D.   On: 10/17/2022 08:52      Impression    Acute on chronic anemia in setting of CKD, AVMs Recent colonoscopy unable to see report with [facility] medical outpatient recall 5 years Enteroscopy 06/2022 nonbleeding AVM status post APC, injected and clips placed. Has been getting ESA and iron IV once weekly, oral iron with dialysis with continuing drop in hemoglobin Some dark stools but patient also on oral iron, FOBT positive 6/27 - Hgb 11 7/4 - 9.7 7/11 - 9.4 7/18 - 9.4 7/25 - 9.3 - Mircera given - ?dose 8/1 - 9.3 8/8 - 8.2 - Mircera given 8/15 - 7.2 8/22 - 7.8 - Mircera given 8/29 - 8 9/5 - 7.1 - Mircera given 9/12 - 6.2  Patient's had continued steady decline despite supportive therapy  Isolated alkaline phosphatase 800s, 300 prior to that, slowly been  increasing Unremarkable AST, ALT and total bilirubin negative GGT unremarkable Previous CT 2023 showed gallstones unremarkable liver, no right upper quadrant abdominal discomfort or pain.  End-stage renal disease On dialysis Tuesday, Thursday, Saturday Only on half of her dialysis today, monitor kidney function, electrolytes and for fluid overload may need repeat dialysis during this hospitalization tomorrow  especially with PRBC.  Principal Problem:   Acute on chronic anemia    LOS: 0 days     Plan   Plan for small bowel enteroscopy with Dr. Lavon Paganini tomorrow, risks discussed with patient including risk of sedation, bleeding, perforation, patient agrees and wants to continue. If this is negative we will plan for capsule endoscopy to evaluate for AVMs further in the small bowel we are unable to reach. Continue monitoring H&H transfuse greater than 7 Patient only had half of her HD today, monitor fluid status and electrolytes, may need to delay procedure or proceed with small bowel pending need for dialysis. Clear liquids today n.p.o. in the morning Will proceed with RUQ [place] and will get fractionated alk phos Trend LFTS Avoid hepatotoxic medications   Thank you for your kind consultation, we will continue to follow.   Doree Albee  10/17/2022, 1:32 PM

## 2022-10-17 NOTE — ED Provider Notes (Addendum)
[place] EMERGENCY DEPARTMENT AT [facility] Provider Note   CSN: [id number] Arrival date & time: 10/17/22  0756     History  Chief Complaint  Patient presents with   Abnormal Lab    HGB <7    Sara Davis is a 72 y.o. female.  HPI Patient medical history significant for type 2 diabetes mellitus, depression, hypertension, dyslipidemia and end-stage renal disease on hemodialysis.  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  Hemoglobin at discharge was 10.1.  Patient reports she got about 2 hours of her dialysis and today but they did not complete the session due to patient's hemoglobin being.  Patient reports she has had anemia before and required blood transfusion.  She reports she did not realize her blood count had gotten that low.  She reports she always has some achiness in her bones and mild fatigue but on the aggregate was feeling pretty well.  Patient reports her bowel movements are always very dark due to taking iron.  She has not noticed any change.  No vomiting.  No abdominal pain.  Patient reports intermittently she is mildly short of breath.  No chest pain.    Home Medications Prior to Admission medications   Medication Sig Start Date End Date Taking? Authorizing Provider  Alcohol Swabs (B-D SINGLE USE SWABS REGULAR) PADS 1 Units by Does not apply route 3 (three) times daily as needed. 07/14/18   [name], [place], DO  atorvastatin (LIPITOR) 80 MG tablet Take 1 tablet (80 mg total) by mouth daily. 12/20/18   Melene Plan, MD  AURYXIA 1 GM 210 MG(Fe) tablet Take 420 mg by mouth 3 (three) times daily. 04/12/22   [provider]  B Complex-C-Folic Acid (DIALYVITE TABLET) TABS Take 1 tablet by mouth daily. 03/05/22   [provider]  calcitRIOL (ROCALTROL) 0.25 MCG capsule Take 0.25 mcg by mouth daily. Patient not taking:  Reported on 06/16/2022 01/29/21   [provider]  Calcium Carbonate (CALCIUM 600 PO) Take 600 mg by mouth 2 (two) times daily. Patient not taking: Reported on 06/16/2022    [provider]  carvedilol (COREG) 25 MG tablet Take 1 tablet (25 mg total) by mouth 2 (two) times daily with a meal. 05/09/19   Royal Hawthorn, NP  cinacalcet (SENSIPAR) 60 MG tablet Take 180 mg by mouth daily. 04/21/22   [provider]  diltiazem (CARDIZEM SR) 120 MG 12 hr capsule Take 1 capsule (120 mg total) by mouth 2 (two) times daily. 06/17/22 07/17/22  Pokhrel, Rebekah [place], MD  glucose blood (ACCU-CHEK AVIVA PLUS) test strip Use as instructed 07/14/18   [name], [place], DO  Lancets (ACCU-CHEK SOFT TOUCH) lancets Use as instructed 07/14/18   [name], [place], DO  pantoprazole (PROTONIX) 40 MG tablet Take 1 tablet (40 mg total) by mouth daily for 28 days. 06/18/22 07/16/22  Pokhrel, Rebekah Chesterfield, MD      Allergies    Aspirin    Review of Systems   Review of Systems  Physical Exam Updated Vital Signs BP (!) 163/65   Pulse 74   Temp 97.7 F (36.5 C) (Oral)   Resp 16   Ht 5\' 6"  (1.676 m)   Wt 54 kg   SpO2 100%   BMI 19.21 kg/m  Physical Exam Constitutional:      Comments: Thin but well-nourished well-developed.  Alert nontoxic.  No respiratory  distress at rest.  HENT:     Mouth/Throat:     Pharynx: Oropharynx is clear.  Eyes:     Extraocular Movements: Extraocular movements intact.  Cardiovascular:     Rate and Rhythm: Normal rate and regular rhythm.  Pulmonary:     Comments: No respiratory distress.  Faint crackles at the bases Abdominal:     General: There is no distension.     Palpations: Abdomen is soft.     Tenderness: There is no abdominal tenderness. There is no guarding.  Genitourinary:    Comments: Rectal exam: Formed dark green stool in the vault.  No melena no visible blood. Musculoskeletal:        General: No swelling or tenderness.     Right lower leg: No edema.      Left lower leg: No edema.  Skin:    General: Skin is warm and dry.  Neurological:     General: No focal deficit present.     Mental Status: She is oriented to person, place, and time.     Motor: No weakness.     Coordination: Coordination normal.  Psychiatric:        Mood and Affect: Mood normal.     ED Results / Procedures / Treatments   Labs (all labs ordered are listed, but only abnormal results are displayed) Labs Reviewed  CBC WITH DIFFERENTIAL/PLATELET - Abnormal; Notable for the following components:      Result Value   RBC 2.18 (*)    Hemoglobin 6.4 (*)    HCT 21.2 (*)    RDW 15.8 (*)    All other components within normal limits  COMPREHENSIVE METABOLIC PANEL - Abnormal; Notable for the following components:   Glucose, Bld 139 (*)    Creatinine, Ser 2.68 (*)    Total Protein 6.1 (*)    Albumin 3.4 (*)    AST 14 (*)    Alkaline Phosphatase 812 (*)    GFR, Estimated 18 (*)    All other components within normal limits  POC OCCULT BLOOD, ED - Abnormal; Notable for the following components:   Fecal Occult Bld POSITIVE (*)    All other components within normal limits  TYPE AND SCREEN  PREPARE RBC (CROSSMATCH)    EKG EKG Interpretation Date/Time:  Saturday October 17 2022 08:12:20 EDT Ventricular Rate:  72 PR Interval:  142 QRS Duration:  99 QT Interval:  431 QTC Calculation: 472 R Axis:   -23  Text Interpretation: Sinus rhythm Borderline left axis deviation Abnormal R-wave progression, early transition no sig change from previous Confirmed by Arby Barrette [phone]) on 10/17/2022 8:39:47 AM  Radiology DG Chest 2 View  Result Date: 10/17/2022 CLINICAL DATA:  Shortness of breath. Patient presented to the emergency department from dialysis after she was told her hemoglobin was too low. EXAM: CHEST - 2 VIEW COMPARISON:  06/15/2022 FINDINGS: Heart size is normal. Low lung volumes. Chronic coarsened interstitial markings. No pleural fluid, airspace consolidation or  frank interstitial edema. No bone abnormalities. IMPRESSION: 1. No acute findings. 2. Chronic interstitial lung disease. Electronically Signed   By: Signa Kell M.D.   On: 10/17/2022 08:52    Procedures Procedures   CRITICAL CARE Performed by: Arby Barrette   Total critical care time: 30 minutes  Critical care time was exclusive of separately billable procedures and treating other patients.  Critical care was necessary to treat or prevent imminent or life-threatening deterioration.  Critical care was time spent personally by me on  the following activities: development of treatment plan with patient and/or surrogate as well as nursing, discussions with consultants, evaluation of patient's response to treatment, examination of patient, obtaining history from patient or surrogate, ordering and performing treatments and interventions, ordering and review of laboratory studies, ordering and review of radiographic studies, pulse oximetry and re-evaluation of patient's condition.  Medications Ordered in ED Medications  0.9 %  sodium chloride infusion (Manually program via Guardrails IV Fluids) ( Intravenous New Bag/Given 10/17/22 0959)    ED Course/ Medical Decision Making/ A&P                                 Medical Decision Making Amount and/or Complexity of Data Reviewed Labs: ordered. Radiology: ordered.  Risk Prescription drug management. Decision regarding hospitalization.   Patient referred to the emergency department from the hemodialysis center.  She is identified to have critically low hemoglobin less than 7.  Patient has prior history of critical anemia requiring transfusion with duodenal AVM identified 4 months ago.  Patient has not noted any brisk bleeding or change that she can identify in terms of her bowel movement although takes iron regularly.  At this time with hemoglobin of 6.4, patient will need repeat transfusion.  At this time mental status is clear, patient is  not objectively short of breath, she did complete partial dialysis and reports she is not typically had problems with volume overload.  However with complex medical history and likely need for dialysis, will plan for admission.  EKG interpreted by me no acute ischemic appearance.  Hemoglobin 6.4.  Potassium 3.5.  BUN 8 creatinine 2.68.  Alk phos 812.  Occult stool does test positive.  Stool however is not melanotic or crampy or bloody in appearance.  I have lower suspicion for brisk GI bleeding at this time.  This may be a combination of anemia of chronic disease plus slower blood loss from GI.  Consult: Internal medicine teaching service Dr. Garey Ham for admission.  Nephrology PA-C [place] messaged with updated hemoglobin values most recently gotten at hemodialysis: 8/15 - 7.2, 8/22 - 7.8, 8/29 - 8, 9/5 - 7.1, 9/12 - 6.2 . Advises patient was sent for low hemoglobin with plan for transfusion but likely not extremely acute bleeding.        Final Clinical Impression(s) / ED Diagnoses Final diagnoses:  Symptomatic anemia  Severe comorbid illness  ESRD (end stage renal disease) on dialysis [facility])    Rx / DC Orders ED Discharge Orders     None         Arby Barrette, MD 10/17/22 1139    Arby Barrette, MD 10/17/22 1151

## 2022-10-17 NOTE — ED Notes (Signed)
Critical hemoglobin 6.4 provider aware

## 2022-10-17 NOTE — Progress Notes (Addendum)
[facility] Quick Note  S: Aware that Ms. Holik has been admitted OBS status for acute on chronic anemia. Her outpatient HD unit has been aware of her progressively declining hgb - had send her home with stool cards that she had not brought back, had been increasing her ESA dose, and was trying to arrange her for outpatient transfusion.  At dialysis today - she was asymptomatic, but got very anxious at the thought of her Hgb being < 7. Hx GIB in 06/2022 requiring treatment of duodenal AVM, therefore she elected to end her HD early and present to the ED for blood transfusion.  O: Blood pressure (!) 160/68, pulse 71, temperature 98.3 F (36.8 C), temperature source Oral, resp. rate 14, height 5\' 6"  (1.676 m), weight 54 kg, SpO2 100%.   Lab Results  Component Value Date   WBC 6.2 10/17/2022   HGB 6.4 (LL) 10/17/2022   HCT 21.2 (L) 10/17/2022   MCV 97.2 10/17/2022   PLT 262 10/17/2022   Outpatient Hgb trends: 6/27 - Hgb 11 7/4 - 9.7 7/11 - 9.4 7/18 - 9.4 7/25 - 9.3 - Mircera given - ?dose 8/1 - 9.3 8/8 - 8.2 - Mircera given 8/15 - 7.2 8/22 - 7.8 - Mircera given 8/29 - 8 9/5 - 7.1 - Mircera given 9/12 - 6.2 Additionally, has been getting IV Venofer 50mg  weekly.  A/P:  Acute on chronic anemia: Hgb 6.4, + FOBT today. Suspect slow GIB in addition to her ESRD related anemia. Getting 2U PRBCs today. Her ESA has already been increased to for her next dose (due on 9/19). GI being consulted for potential inpatient scopes.  ESRD: Usual TTS schedule. Had over half of her usual HD this morning. Unlikely to need HD until next Tuesday 9/17 unless she becomes overloaded in the setting of blood products.  Our team will follow peripherally over the rest of the weekend. If acutely needs dialysis or if she is still inpatient on Tuesday, will complete full consult and provide dialysis.  Ozzie Hoyle, PA-C [employer] Pager [phone]

## 2022-10-18 ENCOUNTER — Encounter (HOSPITAL_COMMUNITY)
Admission: EM | Disposition: A | Discharge status: Home / Self Care | Payer: Self-pay | Source: Home / Self Care | Attending: Emergency Medicine

## 2022-10-18 DIAGNOSIS — N186 End stage renal disease: Secondary | ICD-10-CM | POA: Diagnosis not present

## 2022-10-18 DIAGNOSIS — Z992 Dependence on renal dialysis: Secondary | ICD-10-CM | POA: Diagnosis not present

## 2022-10-18 DIAGNOSIS — D351 Benign neoplasm of parathyroid gland: Secondary | ICD-10-CM | POA: Insufficient documentation

## 2022-10-18 DIAGNOSIS — Q2733 Arteriovenous malformation of digestive system vessel: Secondary | ICD-10-CM

## 2022-10-18 DIAGNOSIS — D649 Anemia, unspecified: Secondary | ICD-10-CM | POA: Diagnosis not present

## 2022-10-18 HISTORY — PX: GIVENS CAPSULE STUDY: SHX5432

## 2022-10-18 LAB — BPAM RBC
Blood Product Expiration Date: 202410132359
Blood Product Expiration Date: 202410132359
ISSUE DATE / TIME: 202409140941
ISSUE DATE / TIME: 202409141155
Unit Type and Rh: 5100
Unit Type and Rh: 5100

## 2022-10-18 LAB — CBC
HCT: 24.9 % — ABNORMAL LOW (ref 36.0–46.0)
Hemoglobin: 7.9 g/dL — ABNORMAL LOW (ref 12.0–15.0)
MCH: 29.5 pg (ref 26.0–34.0)
MCHC: 31.7 g/dL (ref 30.0–36.0)
MCV: 92.9 fL (ref 80.0–100.0)
Platelets: 229 10*3/uL (ref 150–400)
RBC: 2.68 MIL/uL — ABNORMAL LOW (ref 3.87–5.11)
RDW: 15.8 % — ABNORMAL HIGH (ref 11.5–15.5)
WBC: 5.6 10*3/uL (ref 4.0–10.5)
nRBC: 0 % (ref 0.0–0.2)

## 2022-10-18 LAB — TYPE AND SCREEN
ABO/RH(D): O POS
Antibody Screen: NEGATIVE
Unit division: 0
Unit division: 0

## 2022-10-18 LAB — VITAMIN D 25 HYDROXY (VIT D DEFICIENCY, FRACTURES): Vit D, 25-Hydroxy: 32.77 ng/mL (ref 30–100)

## 2022-10-18 LAB — RENAL FUNCTION PANEL
Albumin: 3 g/dL — ABNORMAL LOW (ref 3.5–5.0)
Anion gap: 9 (ref 5–15)
BUN: 20 mg/dL (ref 8–23)
CO2: 25 mmol/L (ref 22–32)
Calcium: 8.9 mg/dL (ref 8.9–10.3)
Chloride: 101 mmol/L (ref 98–111)
Creatinine, Ser: 4.42 mg/dL — ABNORMAL HIGH (ref 0.44–1.00)
GFR, Estimated: 10 mL/min — ABNORMAL LOW (ref >60)
Glucose, Bld: 94 mg/dL (ref 70–99)
Phosphorus: 4.9 mg/dL — ABNORMAL HIGH (ref 2.5–4.6)
Potassium: 3.8 mmol/L (ref 3.5–5.1)
Sodium: 135 mmol/L (ref 135–145)

## 2022-10-18 LAB — GLUCOSE, CAPILLARY
Glucose-Capillary: 94 mg/dL (ref 70–99)
Glucose-Capillary: 69 mg/dL — ABNORMAL LOW (ref 70–99)
Glucose-Capillary: 112 mg/dL — ABNORMAL HIGH (ref 70–99)
Glucose-Capillary: 98 mg/dL (ref 70–99)
Glucose-Capillary: 133 mg/dL — ABNORMAL HIGH (ref 70–99)

## 2022-10-18 SURGERY — IMAGING PROCEDURE, GI TRACT, INTRALUMINAL, VIA CAPSULE

## 2022-10-18 SURGICAL SUPPLY — 1 items: TOWEL COTTON PACK 4EA (MISCELLANEOUS) ×4 IMPLANT

## 2022-10-18 NOTE — Progress Notes (Signed)
[facility] capsule endoscopy ordered by MD Nandigam.  Patient ingested capsule at 0830.  Per Given's capsule instructions, patient to remain NPO until 1030 at which time they may progress to clear liquid diet. At 1230 patient may have a small snack such as a half a sandwich or a bowl of soup. At 1630 patient may progress to previously ordered diet.  The capsule endoscopy study will conclude at 2030 at which time the night shift bedside RN can place recorder and belt in a patient belongings bag at bedside. Endoscopy staff will pick up the equipment in the AM.  Instructions provided to patient and inpatient RN. Patient and RN demonstrated understanding.

## 2022-10-18 NOTE — Plan of Care (Signed)
?  Problem: Elimination: ?Goal: Will not experience complications related to bowel motility ?Outcome: Progressing ?  ?Problem: Pain Managment: ?Goal: General experience of comfort will improve ?Outcome: Progressing ?  ?Problem: Safety: ?Goal: Ability to remain free from injury will improve ?Outcome: Progressing ?  ?

## 2022-10-18 NOTE — Care Management Obs Status (Signed)
MEDICARE OBSERVATION STATUS NOTIFICATION   Patient Details  Name: Sara Davis MRN: [id number] Date of Birth: 09/23/1950   Medicare Observation Status Notification Given:  Yes    Lawerance Sabal, RN 10/18/2022, 3:27 PM

## 2022-10-18 NOTE — Consult Note (Signed)
[facility] Renal Consultation Note    Indication for Consultation:  Management of ESRD/hemodialysis, anemia, hypertension/volume, and secondary hyperparathyroidism. PCP:  HPI: Sara Davis is a 72 y.o. 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   Presented to ED on 9/14 with concern of GI bleed after being told that her outpatient Hgb was down to 6.2. Has had progressively declining Hgb over the past few months. Her HD unit had been increasing her ESA dose, and was trying to arrange her for outpatient transfusion.  Outpatient Hgb trends: 6/27 - Hgb 11 7/4 - 9.7 7/11 - 9.4 7/18 - 9.4 7/25 - 9.3 - Mircera given - ?dose 8/1 - 9.3 8/8 - 8.2 - Mircera given 8/15 - 7.2 8/22 - 7.8 - Mircera given 8/29 - 8 9/5 - 7.1 - Mircera given 9/12 - 6.2 Additionally, has been getting IV Venofer 50mg  weekly.  In the ED, vitals were stable. Hgb 6.4. GI consulted. Plan was starting with capsule endoscopy, but possibly EGD if needed. She did have colonoscopy at [facility] within the past few weeks - no report available.  Seen in room today - no complaints. Denies fever, chills, CP, dyspnea, abd pain, N/V/D.  Dialyzes on TTS schedule at [facility] clinic. Last HD Sat 9/14 - completed about half. Uses RUE AVG without recent issues.  Past Medical History:  Diagnosis Date   Anemia    Cellulitis of scalp 02/2015   Chronic kidney disease 01/20/2021   stage V   Depression    Diabetes mellitus without complication (HCC)    History of blood transfusion    Hyperlipidemia    Hypertension    Past Surgical History:  Procedure Laterality Date   ABDOMINAL HYSTERECTOMY     AV FISTULA PLACEMENT Right 02/05/2021   Procedure: INSERTION OF RIGHT ARM ARTERIOVENOUS (AV) GORE-TEX GRAFT;  Surgeon: Leonie Douglas, MD;  Location: [facility] OR;  Service: Vascular;  Laterality: Right;   BIOPSY  01/07/2018   Procedure: BIOPSY;  Surgeon: Hilarie Fredrickson,  MD;  Location: [facility] ENDOSCOPY;  Service: Endoscopy;;  Clotest   COLONOSCOPY WITH PROPOFOL N/A 01/07/2018   Procedure: COLONOSCOPY WITH PROPOFOL;  Surgeon: Hilarie Fredrickson, MD;  Location: [facility] ENDOSCOPY;  Service: Endoscopy;  Laterality: N/A;   ENTEROSCOPY N/A 06/17/2022   Procedure: ENTEROSCOPY;  Surgeon: Sherrilyn Rist, MD;  Location: [facility] ENDOSCOPY;  Service: Gastroenterology;  Laterality: N/A;   ESOPHAGOGASTRODUODENOSCOPY (EGD) WITH PROPOFOL N/A 01/07/2018   Procedure: ESOPHAGOGASTRODUODENOSCOPY (EGD) WITH PROPOFOL;  Surgeon: Hilarie Fredrickson, MD;  Location: [facility] ENDOSCOPY;  Service: Endoscopy;  Laterality: N/A;   ESOPHAGOGASTRODUODENOSCOPY (EGD) WITH PROPOFOL N/A 12/14/2018   Procedure: ESOPHAGOGASTRODUODENOSCOPY (EGD) WITH PROPOFOL;  Surgeon: Hilarie Fredrickson, MD;  Location: [facility] ENDOSCOPY;  Service: Endoscopy;  Laterality: N/A;   HEMOSTASIS CLIP PLACEMENT  06/17/2022   Procedure: HEMOSTASIS CLIP PLACEMENT;  Surgeon: Sherrilyn Rist, MD;  Location: [facility] ENDOSCOPY;  Service: Gastroenterology;;   HEMOSTASIS CONTROL  12/14/2018   Procedure: HEMOSTASIS CONTROL;  Surgeon: Hilarie Fredrickson, MD;  Location: [facility] ENDOSCOPY;  Service: Endoscopy;;   HOT HEMOSTASIS N/A 06/17/2022   Procedure: HOT HEMOSTASIS (ARGON PLASMA COAGULATION/BICAP);  Surgeon: Sherrilyn Rist, MD;  Location: [facility] ENDOSCOPY;  Service: Gastroenterology;  Laterality: N/A;   POLYPECTOMY  01/07/2018   Procedure: POLYPECTOMY;  Surgeon: Hilarie Fredrickson, MD;  Location: [facility] ENDOSCOPY;  Service: Endoscopy;;   SCLEROTHERAPY  06/17/2022   Procedure: Susa Day;  Surgeon: Charlie Pitter  III, MD;  Location: [facility] ENDOSCOPY;  Service: Gastroenterology;;   Family History  Problem Relation Age of Onset   Diabetes Mother    Cancer Mother    Diabetes Sister    Hypertension Sister    Cancer Sister 52       Bone   Diabetes Brother    Diabetes Sister    Diabetes Sister    Social History:  reports that she has been smoking cigarettes. She has a 26.5 pack-year  smoking history. She has never used smokeless tobacco. She reports that she does not drink alcohol and does not use drugs.  ROS: As per HPI otherwise negative.  Physical Exam: Vitals:   10/18/22 0014 10/18/22 0400 10/18/22 0542 10/18/22 0731  BP: (!) 156/58 136/60  (!) 144/58  Pulse: 68 67  66  Resp: 16 19  18   Temp: 98.1 F (36.7 C) 98.2 F (36.8 C)  98.3 F (36.8 C)  TempSrc: Oral Oral    SpO2: 100% 100%  100%  Weight:   54.6 kg   Height:         General: Well developed, well nourished, in no acute distress. Head: Normocephalic, atraumatic, sclera non-icteric, mucus membranes are moist. Neck: Supple without lymphadenopathy/masses. JVD not elevated. Lungs: Clear bilaterally to auscultation without wheezes, rales, or rhonchi. Breathing is unlabored. Heart: RRR with normal S1, S2. No murmurs, rubs, or gallops appreciated. Abdomen: Soft, non-tender, non-distended with normoactive bowel sounds.  Musculoskeletal:  Strength and tone appear normal for age. Lower extremities: No edema or ischemic changes, no open wounds. Neuro: Alert and oriented X 3. Moves all extremities spontaneously. Psych:  Responds to questions appropriately with a normal affect. Dialysis Access: RUE AVG + bruit  Allergies  Allergen Reactions   Aspirin Other (See Comments)    Upset stomach, can only take coated aspirin but prefers to not take it at all   Prior to Admission medications   Medication Sig Start Date End Date Taking? Authorizing Provider  atorvastatin (LIPITOR) 80 MG tablet Take 1 tablet (80 mg total) by mouth daily. 12/20/18  Yes Melene Plan, MD  AURYXIA 1 GM 210 MG(Fe) tablet Take 420 mg by mouth 3 (three) times daily. 04/12/22  Yes [provider]  B Complex-C-Folic Acid (DIALYVITE TABLET) TABS Take 1 tablet by mouth daily. 03/05/22  Yes [provider]  carvedilol (COREG) 25 MG tablet Take 1 tablet (25 mg total) by mouth 2 (two) times daily with a meal. 05/09/19  Yes Royal Hawthorn, NP  cinacalcet (SENSIPAR) 60 MG tablet Take 180 mg by mouth daily. 04/21/22  Yes [provider]  Alcohol Swabs (B-D SINGLE USE SWABS REGULAR) PADS 1 Units by Does not apply route 3 (three) times daily as needed. 07/14/18   [name], [place], DO  diltiazem (CARDIZEM SR) 120 MG 12 hr capsule Take 1 capsule (120 mg total) by mouth 2 (two) times daily. 06/17/22 07/17/22  Pokhrel, Rebekah Chesterfield, MD  glucose blood (ACCU-CHEK AVIVA PLUS) test strip Use as instructed 07/14/18   [name], [place], DO  Lancets (ACCU-CHEK SOFT TOUCH) lancets Use as instructed 07/14/18   [name], [place], DO  pantoprazole (PROTONIX) 40 MG tablet Take 1 tablet (40 mg total) by mouth daily for 28 days. 06/18/22 07/16/22  Pokhrel, Rebekah Chesterfield, MD   Current Facility-Administered Medications  Medication Dose Route Frequency Provider Last Rate Last Admin   acetaminophen (TYLENOL) tablet 650 mg  650 mg Oral Q4H while awake Meryl Dare, MD       atorvastatin (  LIPITOR) tablet 80 mg  80 mg Oral Daily [name], MD   80 mg at 10/18/22 0810   capsaicin (ZOSTRIX) 0.025 % cream   Topical TID Meryl Dare, MD   Given at 10/18/22 [phone]   carvedilol (COREG) tablet 25 mg  25 mg Oral BID WC [name], MD   25 mg at 10/18/22 0810   folic acid (FOLVITE) tablet 1 mg  1 mg Oral Daily [name], MD   1 mg at 10/18/22 0810   insulin aspart (novoLOG) injection 0-9 Units  0-9 Units Subcutaneous TID WC [name], MD       Oral care mouth rinse  15 mL Mouth Rinse PRN Mercie Eon, MD       pantoprazole (PROTONIX) EC tablet 40 mg  40 mg Oral Daily [name], MD   40 mg at 10/18/22 0810   Labs: Basic Metabolic Panel: Recent Labs  Lab 10/17/22 0810 10/18/22 0652  NA 137 135  K 3.5 3.8  CL 98 101  CO2 29 25  GLUCOSE 139* 94  BUN 8 20  CREATININE 2.68* 4.42*  CALCIUM 9.2 8.9  PHOS  --  4.9*   Liver Function Tests: Recent Labs  Lab 10/17/22 0810 10/18/22 0652  AST 14*  --   ALT 10  --   ALKPHOS 812*   --   BILITOT 0.4  --   PROT 6.1*  --   ALBUMIN 3.4* 3.0*   CBC: Recent Labs  Lab 10/17/22 0810 10/17/22 1758 10/18/22 0652  WBC 6.2 6.4 5.6  NEUTROABS 4.2  --   --   HGB 6.4* 8.3* 7.9*  HCT 21.2* 25.5* 24.9*  MCV 97.2 92.7 92.9  PLT 262 224 229   Iron Studies:  Recent Labs    10/17/22 1528  IRON 70  TIBC 309  FERRITIN 323*   Studies/Results: US ABDOMEN LIMITED RUQ (LIVER/GB)  Result Date: 10/17/2022 CLINICAL DATA:  Elevated alkaline phosphatase. EXAM: ULTRASOUND ABDOMEN LIMITED RIGHT UPPER QUADRANT COMPARISON:  CT abdomen and pelvis, 08/08/2021. FINDINGS: Gallbladder: 2 cm stone in gallbladder fundus. No gallbladder wall thickening or pericholecystic fluid. Patient is tender to transducer pressure over the gallbladder. Common bile duct: Diameter: 5 mm Liver: No focal lesion identified. Within normal limits in parenchymal echogenicity. Portal vein is patent on color Doppler imaging with normal direction of blood flow towards the liver. Other: None. IMPRESSION: 1. No acute findings. 2. Cholelithiasis but no sonographic evidence acute cholecystitis. 3. No bile duct dilation. Electronically Signed   By: Amie Portland M.D.   On: 10/17/2022 16:02   DG Chest 2 View  Result Date: 10/17/2022 CLINICAL DATA:  Shortness of breath. Patient presented to the emergency department from dialysis after she was told her hemoglobin was too low. EXAM: CHEST - 2 VIEW COMPARISON:  06/15/2022 FINDINGS: Heart size is normal. Low lung volumes. Chronic coarsened interstitial markings. No pleural fluid, airspace consolidation or frank interstitial edema. No bone abnormalities. IMPRESSION: 1. No acute findings. 2. Chronic interstitial lung disease. Electronically Signed   By: Signa Kell M.D.   On: 10/17/2022 08:52    Dialysis Orders:  TTS at [facility] 3:45hr, 180 dialyzer, 400/700, EDW 54.5kg, 3K/2.5Ca bath, RUE AVG, no heparin - Mircera IV q 2 weeks (dose just raised - last given on 10/08/22) -  Venofer 50mg  IV weekly - Calcitriol 0.17mcg PO q HD - just restarted after long hold for hyperCa (PTH 1897) - Home BMM meds: Auryxia 2/meals, sensipar 180mg  daily  Assessment/Plan:  Acute on chronic anemia: Hgb 6.4 on admit, + FOBT. Suspect slow GIB in addition to her ESRD related anemia. S/p 2U PRBCs. Her ESA has already been increased to for her next dose (due on 9/19). GI consulted, she is undergoing capsule endoscopy today - to be read tomorrow, then plan at that time - may need EGD.  ESRD:  Continue HD per usual TTS schedule - next HD 9/17.  Hypertension/volume: BP decent, volume seems ok despite blood products - monitor.  Metabolic bone disease: Ca/Phos fine here - currently NPO for capsule endoscopy - resume home meds once allowed to eat full diet.  T2DM  Ozzie Hoyle, Cordelia Poche 10/18/2022, 10:56 AM  [employer]

## 2022-10-18 NOTE — Progress Notes (Signed)
                 Interval history Overall feels well, but does report some fatigue that was ongoing prior to admission.  Upset to spend her birthday in the hospital.  Physical exam Blood pressure (!) 144/58, pulse 66, temperature 98.3 F (36.8 C), resp. rate 18, height 5\' 6"  (1.676 m), weight 54.6 kg, SpO2 100%.  No distress Heart rate and rhythm normal, radial pulses strong Breathing is regular and unlabored on room air, lung sounds clear Skin is warm and dry Alert and oriented  Labs, images, and other studies Stable hemoglobin  Assessment and plan Hospital day 0  Sara Davis is a 72 y.o. with ESRD and chronic anemia admitted for acute worsening of anemia and workup for GI blood loss.  Acute on chronic normocytic anemia Duodenal AVM Multifactorial in this person with ESRD, folic acid deficiency, and duodenal AVMs.  Hemoglobin stable after 2 units PRBC.  Capsule enteroscopy today.  Appreciate GI assistance with this case.  Folate deficiency - Start daily folic acid  ESRD Finished most of an outpatient HD session prior to coming to hospital.  Labs and mental status normal, euvolemic.  Hopeful for discharge prior to next outpatient HD session on Tuesday.  Diabetes Blood sugar well-controlled. - Sensitive SSI  Parathyroid adenoma Osteopenia Adenoma seen on nuclear medicine study recently.  Workup initiated prior to admission for elevated alk phos and hyperparathyroidism.  Alkaline phosphatase likely of bone origin.  DEXA from 2021 suggests osteopenia.  Will make endocrinology referral at discharge. - PTH level pending  Diet: Renal/carb modified 1200 mL fluid restriction IVF: N/A VTE: SCDs Start: 10/17/22 1239  Code: Full PT/OT recommendations: PT eval requested TOC recommendations: N/A  Discharge plan: Likely tomorrow, pending completion of capsule enteroscopy  Sara Mood MD 10/18/2022, 12:55 PM  Pager: [phone] After 5pm or weekend: [phone]

## 2022-10-18 NOTE — Evaluation (Signed)
Physical Therapy Evaluation & Discharge Patient Details Name: Sara Davis MRN: [id number] DOB: [birth year] Today's Date: 10/18/2022  History of Present Illness  The pt is a 72 yo female presenting 9/14 from dialysis with low Hgb and intermittent SOB. Work up revealed acute on chronic anemia exacerbated by slow blood loss from GI. PMH includes: DM II, depression, HTN, dyslipidemia, and ESRD on HD.   Clinical Impression  Pt presents with condition above and deficits mentioned below. PTA, she was mod I using her [facility] for functional mobility, living with her son (who works) in a 1-level apartment with 3 STE. Currently, pt demonstrates deficits in generalized strength, balance, and activity tolerance. She ambulates unsteadily with her [facility] but only required CGA for safety as she would maintain/regain her balance with reactional strategies and the SPC. Pt self-limiting to ambulating a lap in the room only today as she is frustrated at not having answers for the causes of her medical issues. She reports she mobilized better than she had been doing PTA. She is declining all recommendations for DME and follow-up PT, acutely and post-acutely. She verbalized understanding of her being at risk for falls. Encouraged pt to mobilize with nursing while here. As pt is declining further PT services, reports she is back to if not better than her recent baseline, and all education has been completed and questions answered, PT will sign off.        If plan is discharge home, recommend the following: A little help with walking and/or transfers;A little help with bathing/dressing/bathroom;Assistance with cooking/housework;Assist for transportation;Help with stairs or ramp for entrance   Can travel by private vehicle        Equipment Recommendations None recommended by PT (pt declining RW and shower chair)  Recommendations for Other Services       Functional Status Assessment Patient has not had a recent decline in  their functional status     Precautions / Restrictions Precautions Precautions: Fall Restrictions Weight Bearing Restrictions: No      Mobility  Bed Mobility Overal bed mobility: Modified Independent             General bed mobility comments: Able to transition supine <> sit EOB without assistance    Transfers Overall transfer level: Needs assistance Equipment used: Straight cane Transfers: Sit to/from Stand Sit to Stand: Contact guard assist, From elevated surface           General transfer comment: EOB needed to be elevated as pt could not successfully power up from low bed height, CGA for safety, mildly unsteady but no LOB    Ambulation/Gait Ambulation/Gait assistance: Contact guard assist Gait Distance (Feet): 30 Feet Assistive device: Straight cane Gait Pattern/deviations: Step-through pattern, Decreased stride length, Drifts right/left Gait velocity: reduced Gait velocity interpretation: <1.8 ft/sec, indicate of risk for recurrent falls   General Gait Details: Pt ambulates with unsteady steps but able to maintain/regain her balance on her own with reactional strategies and SPC. CGA for safety. Pt deferred further mobility  Stairs            Wheelchair Mobility     Tilt Bed    Modified Rankin (Stroke Patients Only)       Balance Overall balance assessment: Mild deficits observed, not formally tested  Pertinent Vitals/Pain Pain Assessment Pain Assessment: Faces Faces Pain Scale: No hurt Pain Intervention(s): Monitored during session    Home Living Family/patient expects to be discharged to:: Private residence Living Arrangements: Children (son) Available Help at Discharge: Family;Available PRN/intermittently Type of Home: Apartment Home Access: Stairs to enter Entrance Stairs-Rails: Can reach both Entrance Stairs-Number of Steps: 3   Home Layout: One level Home Equipment:  Cane - single point      Prior Function Prior Level of Function : Independent/Modified Independent;Driving             Mobility Comments: Uses SPC - unsteady at baseline ADLs Comments: cooks and cleans some     Extremity/Trunk Assessment   Upper Extremity Assessment Upper Extremity Assessment: Generalized weakness    Lower Extremity Assessment Lower Extremity Assessment: Generalized weakness    Cervical / Trunk Assessment Cervical / Trunk Assessment: Normal  Communication   Communication Communication: No apparent difficulties  Cognition Arousal: Alert Behavior During Therapy: Agitated (mildly in regards to not knowing the cause of her issues) Overall Cognitive Status: No family/caregiver present to determine baseline cognitive functioning                                 General Comments: Pt mildly agitated in regards to being frustrated at not having answers for her medical issues. Tangential in regards to this topic. Likely baseline.        General Comments General comments (skin integrity, edema, etc.): pt reports she is mobilizing better than she was PTA and declines desire for any DME or any further PT, acute and post-acute; educated pt on her risk for falls and on risk of deconditioning and to mobilize with nursing while here, she verbalized understanding    Exercises     Assessment/Plan    PT Assessment Patient does not need any further PT services (pt declined any more)  PT Problem List         PT Treatment Interventions      PT Goals (Current goals can be found in the Care Plan section)  Acute Rehab PT Goals Patient Stated Goal: to go home tomorrow PT Goal Formulation: All assessment and education complete, DC therapy Time For Goal Achievement: 10/19/22 Potential to Achieve Goals: Good    Frequency       Co-evaluation               AM-PAC PT "6 Clicks" Mobility  Outcome Measure Help needed turning from your back to your  side while in a flat bed without using bedrails?: None Help needed moving from lying on your back to sitting on the side of a flat bed without using bedrails?: None Help needed moving to and from a bed to a chair (including a wheelchair)?: A Little Help needed standing up from a chair using your arms (e.g., wheelchair or bedside chair)?: A Little Help needed to walk in hospital room?: A Little Help needed climbing 3-5 steps with a railing? : A Little 6 Click Score: 20    End of Session   Activity Tolerance: Other (comment) (pt self-limiting) Patient left: in bed;with call bell/phone within reach;with bed alarm set   PT Visit Diagnosis: Unsteadiness on feet (R26.81);Muscle weakness (generalized) (M62.81);Other abnormalities of gait and mobility (R26.89)    Time: 0865-7846 PT Time Calculation (min) (ACUTE ONLY): 17 min   Charges:   PT Evaluation $PT Eval Moderate Complexity: 1 Mod   PT  General Charges $$ ACUTE PT VISIT: 1 Visit         Virgil Benedict, PT, DPT Acute Rehabilitation Services  Office: [phone]   Bettina Gavia 10/18/2022, 4:07 PM

## 2022-10-19 ENCOUNTER — Encounter (HOSPITAL_COMMUNITY): Payer: Self-pay | Admitting: Gastroenterology

## 2022-10-19 DIAGNOSIS — N186 End stage renal disease: Secondary | ICD-10-CM | POA: Diagnosis not present

## 2022-10-19 DIAGNOSIS — D649 Anemia, unspecified: Secondary | ICD-10-CM | POA: Diagnosis not present

## 2022-10-19 DIAGNOSIS — Z992 Dependence on renal dialysis: Secondary | ICD-10-CM | POA: Diagnosis not present

## 2022-10-19 LAB — HEMOGLOBIN A1C
Hgb A1c MFr Bld: 5.3 % (ref 4.8–5.6)
Mean Plasma Glucose: 105 mg/dL

## 2022-10-19 LAB — RENAL FUNCTION PANEL
Albumin: 2.8 g/dL — ABNORMAL LOW (ref 3.5–5.0)
Anion gap: 12 (ref 5–15)
BUN: 37 mg/dL — ABNORMAL HIGH (ref 8–23)
CO2: 23 mmol/L (ref 22–32)
Calcium: 9.2 mg/dL (ref 8.9–10.3)
Chloride: 104 mmol/L (ref 98–111)
Creatinine, Ser: 5.73 mg/dL — ABNORMAL HIGH (ref 0.44–1.00)
GFR, Estimated: 7 mL/min — ABNORMAL LOW (ref >60)
Glucose, Bld: 97 mg/dL (ref 70–99)
Phosphorus: 5.2 mg/dL — ABNORMAL HIGH (ref 2.5–4.6)
Potassium: 4.3 mmol/L (ref 3.5–5.1)
Sodium: 139 mmol/L (ref 135–145)

## 2022-10-19 LAB — GLUCOSE, CAPILLARY
Glucose-Capillary: 104 mg/dL — ABNORMAL HIGH (ref 70–99)
Glucose-Capillary: 125 mg/dL — ABNORMAL HIGH (ref 70–99)
Glucose-Capillary: 134 mg/dL — ABNORMAL HIGH (ref 70–99)
Glucose-Capillary: 160 mg/dL — ABNORMAL HIGH (ref 70–99)

## 2022-10-19 LAB — CBC
HCT: 25.4 % — ABNORMAL LOW (ref 36.0–46.0)
Hemoglobin: 8.1 g/dL — ABNORMAL LOW (ref 12.0–15.0)
MCH: 30.1 pg (ref 26.0–34.0)
MCHC: 31.9 g/dL (ref 30.0–36.0)
MCV: 94.4 fL (ref 80.0–100.0)
Platelets: 215 10*3/uL (ref 150–400)
RBC: 2.69 MIL/uL — ABNORMAL LOW (ref 3.87–5.11)
RDW: 14.9 % (ref 11.5–15.5)
WBC: 5.8 10*3/uL (ref 4.0–10.5)
nRBC: 0 % (ref 0.0–0.2)

## 2022-10-19 LAB — PARATHYROID HORMONE, INTACT (NO CA): PTH: 2214 pg/mL — ABNORMAL HIGH (ref 15–65)

## 2022-10-19 LAB — NUCLEOTIDASE, 5', BLOOD: 5-Nucleotidase: 6 IU/L (ref 0–13)

## 2022-10-19 LAB — HEPATITIS B SURFACE ANTIGEN: Hepatitis B Surface Ag: NONREACTIVE

## 2022-10-19 NOTE — Progress Notes (Signed)
Patient ID: Sara Davis, female   DOB: [birth year], 72 y.o.   MRN: [id number]    Progress Note   Subjective  Day # 3 CC; anemia, heme positive stool, chronic iron deficiency anemia/end-stage renal disease  Capsule endoscopy-for visualization of the stomach due to retained fluids, 1 AVM nonbleeding in the duodenum, at 1 hour 53 minutes, which is about 3 minutes beyond the first duodenal image, there are 2 tiny nonbleeding AVMs further down in the small bowel beyond the 3-hour mark and dark stool in the distal small bowel, no gross blood or overt bleeding  Hemoglobin 6.4 on 10/17/2022> transfused up to 8.3> 8.1 today BUN 37/creatinine 5.73  No current complaints of abdominal pain or discomfort, was hoping to go home   Objective   Vital signs in last 24 hours: Temp:  [97.7 F (36.5 C)-98.6 F (37 C)] 97.7 F (36.5 C) (09/16 1105) Pulse Rate:  [63-72] 63 (09/16 1105) Resp:  [18-20] 18 (09/16 1105) BP: (123-173)/(49-64) 161/56 (09/16 1105) SpO2:  [98 %-100 %] 100 % (09/16 1105) Last BM Date : 10/15/22 General:    Older African-American female in NAD, thin Heart:  Regular rate and rhythm; no murmurs Lungs: Respirations even and unlabored, lungs CTA bilaterally Abdomen:  Soft, nontender and nondistended. Normal bowel sounds. Extremities:  Without edema. Neurologic:  Alert and oriented,  grossly normal neurologically. Psych:  Cooperative. Normal mood and affect.  Intake/Output from previous day: No intake/output data recorded. Intake/Output this shift: No intake/output data recorded.  Lab Results: Recent Labs    10/17/22 1758 10/18/22 0652 10/19/22 0801  WBC 6.4 5.6 5.8  HGB 8.3* 7.9* 8.1*  HCT 25.5* 24.9* 25.4*  PLT 224 229 215   BMET Recent Labs    10/17/22 0810 10/18/22 0652 10/19/22 0801  NA 137 135 139  K 3.5 3.8 4.3  CL 98 101 104  CO2 29 25 23   GLUCOSE 139* 94 97  BUN 8 20 37*  CREATININE 2.68* 4.42* 5.73*  CALCIUM 9.2 8.9 9.2   LFT Recent Labs     10/17/22 0810 10/18/22 0652 10/19/22 0801  PROT 6.1*  --   --   ALBUMIN 3.4*   < > 2.8*  AST 14*  --   --   ALT 10  --   --   ALKPHOS 812*  --   --   BILITOT 0.4  --   --    < > = values in this interval not displayed.   PT/INR No results for input(s): "LABPROT", "INR" in the last 72 hours.  Studies/Results: US ABDOMEN LIMITED RUQ (LIVER/GB)  Result Date: 10/17/2022 CLINICAL DATA:  Elevated alkaline phosphatase. EXAM: ULTRASOUND ABDOMEN LIMITED RIGHT UPPER QUADRANT COMPARISON:  CT abdomen and pelvis, 08/08/2021. FINDINGS: Gallbladder: 2 cm stone in gallbladder fundus. No gallbladder wall thickening or pericholecystic fluid. Patient is tender to transducer pressure over the gallbladder. Common bile duct: Diameter: 5 mm Liver: No focal lesion identified. Within normal limits in parenchymal echogenicity. Portal vein is patent on color Doppler imaging with normal direction of blood flow towards the liver. Other: None. IMPRESSION: 1. No acute findings. 2. Cholelithiasis but no sonographic evidence acute cholecystitis. 3. No bile duct dilation. Electronically Signed   By: Amie Portland M.D.   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  Suspect intermittent chronic low-grade GI blood loss secondary to small bowel AVMs  #2 adenomatous colon polyps-very recent outpatient colonoscopy through [facility] about 6 weeks ago and told to have 5-year follow-up.  We do not have that report  #3 cholelithiasis #4 parathyroid adenoma #5 folate deficiency #6 elevated alk phos- likely bone origin-to see endocrinology as  outpatient  Plan; n.p.o. after midnight Has been scheduled for enteroscopy tomorrow morning 7:30 AM with Dr. Tomasa Rand to treat any visualized AVMs.  Procedure was discussed in detail with the patient including indications risk benefits and she is agreeable to proceed. Tomorrow is a dialysis today for her, hopefully dialysis can be completed later in the day and she still may be able to be discharged afternoon tomorrow.  Outpatient follow-up for GI will be with [facility]    Principal Problem:   Acute on chronic anemia Active Problems:   Absolute anemia   AVM (arteriovenous malformation) of small bowel, acquired   Heme positive stool   Symptomatic anemia   ESRD (end stage renal disease) on dialysis [facility])   Parathyroid adenoma     LOS: 0 days   [name] PA-C 10/19/2022, 3:07 PM

## 2022-10-19 NOTE — Discharge Instructions (Addendum)
Expect phone call from Endocrinology, if you don't hear anything but next week call them to schedule an appointment.  Follow up with GI  If there is any concerns for bleeding, call your doctor. If you have black stools (different from iron) or blood in stool, call doctor.

## 2022-10-19 NOTE — Progress Notes (Signed)
Subjective:  Sara Davis is a 72 y.o. with ESRD and chronic anemia admitted for acute worsening of anemia and workup for GI blood loss.   No overnight events. Pt reports no concerns.   Review of Systems  Constitutional:  Negative for chills and fever.  Respiratory:  Negative for hemoptysis.   Cardiovascular:  Negative for chest pain and palpitations.  Gastrointestinal:  Negative for abdominal pain, blood in stool, constipation, diarrhea, melena, nausea and vomiting.  Genitourinary:  Negative for hematuria.  Neurological:  Negative for dizziness, weakness and headaches.    Objective:  Vital signs in last 24 hours: mildly hypertensive, wide pulse pressure  Vitals:   10/19/22 0413 10/19/22 0723 10/19/22 1105 10/19/22 1543  BP: (!) 143/63 (!) 173/64 (!) 161/56 (!) 176/64  Pulse: 66 65 63 70  Resp: 20 18 18 18   Temp: 98 F (36.7 C) 98.1 F (36.7 C) 97.7 F (36.5 C)   TempSrc: Oral Oral Oral   SpO2: 98% 100% 100% 99%  Weight:      Height:       No intake or output data in the 24 hours ending 10/19/22 1717 Net IO Since Admission: 1,455 mL [10/19/22 1717] Physical Exam Vitals and nursing note reviewed.  Cardiovascular:     Rate and Rhythm: Normal rate and regular rhythm.     Heart sounds: Normal heart sounds.  Pulmonary:     Effort: Pulmonary effort is normal.     Breath sounds: Normal breath sounds.  Abdominal:     General: Abdomen is flat. Bowel sounds are normal.     Palpations: Abdomen is soft.     Tenderness: There is no abdominal tenderness.  Neurological:     General: No focal deficit present.     Mental Status: She is alert.     Labs & Imaging No labs. Pending results of capsule endoscopy  Assessment/Plan: Sara Davis is a 72 y.o. with ESRD and chronic anemia admitted for acute worsening of anemia and workup for GI blood loss.   Principal Problem:   Acute on chronic anemia Active Problems:   Absolute anemia   AVM (arteriovenous malformation) of  small bowel, acquired   Heme positive stool   Symptomatic anemia   ESRD (end stage renal disease) on dialysis [facility])   Parathyroid adenoma  #Acute on chronic normocytic anemia #Duodenal AVM Multifactorial in this person with ESRD, folic acid deficiency, and duodenal AVMs.  Hemoglobin stable after 2 units PRBC.  Capsule enteroscopy demontrated AVMs, plan to for enteroscopy in morning 0730. NPO tonight.  Appreciate GI assistance with this case.   ESRD Finished most of an outpatient HD session prior to coming to hospital.  Labs and mental status normal, euvolemic. Likely plan for inpatient dialysis tomorrow  Folate deficiency - Start daily folic acid  Diabetes Blood sugar well-controlled. - Sensitive SSI   Parathyroid adenoma Osteopenia Adenoma seen on nuclear medicine study recently.  Workup initiated prior to admission for elevated alk phos and hyperparathyroidism.  Alkaline phosphatase likely of bone origin.  DEXA from 2021 suggests osteopenia. PTH level very high >2000. Endocrinology outpatient scheduled.    Diet: NPO IVF: N/A VTE: SCDs Start: 10/17/22 1239  Code: Full PT/OT recommendations: no PT followup (pt refused) TOC recommendations: N/A   Prior to Admission Living Arrangement: home with son Anticipated Discharge Location: home with son Barriers to Discharge: capsule enteroscopy results Dispo: Likely today, pending completion of capsule enteroscopy  Meryl Dare, MD PGY-1 Psychiatry Resident 10/19/2022, 5:17 PM  Pager: [phone] After 5pm on weekdays and 1pm on weekends: On Call pager [phone]

## 2022-10-19 NOTE — H&P (View-Only) (Signed)
Patient ID: Sara Davis, female   DOB: [birth year], 72 y.o.   MRN: [id number]    Progress Note   Subjective  Day # 3 CC; anemia, heme positive stool, chronic iron deficiency anemia/end-stage renal disease  Capsule endoscopy-for visualization of the stomach due to retained fluids, 1 AVM nonbleeding in the duodenum, at 1 hour 53 minutes, which is about 3 minutes beyond the first duodenal image, there are 2 tiny nonbleeding AVMs further down in the small bowel beyond the 3-hour mark and dark stool in the distal small bowel, no gross blood or overt bleeding  Hemoglobin 6.4 on 10/17/2022> transfused up to 8.3> 8.1 today BUN 37/creatinine 5.73  No current complaints of abdominal pain or discomfort, was hoping to go home   Objective   Vital signs in last 24 hours: Temp:  [97.7 F (36.5 C)-98.6 F (37 C)] 97.7 F (36.5 C) (09/16 1105) Pulse Rate:  [63-72] 63 (09/16 1105) Resp:  [18-20] 18 (09/16 1105) BP: (123-173)/(49-64) 161/56 (09/16 1105) SpO2:  [98 %-100 %] 100 % (09/16 1105) Last BM Date : 10/15/22 General:    Older African-American female in NAD, thin Heart:  Regular rate and rhythm; no murmurs Lungs: Respirations even and unlabored, lungs CTA bilaterally Abdomen:  Soft, nontender and nondistended. Normal bowel sounds. Extremities:  Without edema. Neurologic:  Alert and oriented,  grossly normal neurologically. Psych:  Cooperative. Normal mood and affect.  Intake/Output from previous day: No intake/output data recorded. Intake/Output this shift: No intake/output data recorded.  Lab Results: Recent Labs    10/17/22 1758 10/18/22 0652 10/19/22 0801  WBC 6.4 5.6 5.8  HGB 8.3* 7.9* 8.1*  HCT 25.5* 24.9* 25.4*  PLT 224 229 215   BMET Recent Labs    10/17/22 0810 10/18/22 0652 10/19/22 0801  NA 137 135 139  K 3.5 3.8 4.3  CL 98 101 104  CO2 29 25 23   GLUCOSE 139* 94 97  BUN 8 20 37*  CREATININE 2.68* 4.42* 5.73*  CALCIUM 9.2 8.9 9.2   LFT Recent Labs     10/17/22 0810 10/18/22 0652 10/19/22 0801  PROT 6.1*  --   --   ALBUMIN 3.4*   < > 2.8*  AST 14*  --   --   ALT 10  --   --   ALKPHOS 812*  --   --   BILITOT 0.4  --   --    < > = values in this interval not displayed.   PT/INR No results for input(s): "LABPROT", "INR" in the last 72 hours.  Studies/Results: US ABDOMEN LIMITED RUQ (LIVER/GB)  Result Date: 10/17/2022 CLINICAL DATA:  Elevated alkaline phosphatase. EXAM: ULTRASOUND ABDOMEN LIMITED RIGHT UPPER QUADRANT COMPARISON:  CT abdomen and pelvis, 08/08/2021. FINDINGS: Gallbladder: 2 cm stone in gallbladder fundus. No gallbladder wall thickening or pericholecystic fluid. Patient is tender to transducer pressure over the gallbladder. Common bile duct: Diameter: 5 mm Liver: No focal lesion identified. Within normal limits in parenchymal echogenicity. Portal vein is patent on color Doppler imaging with normal direction of blood flow towards the liver. Other: None. IMPRESSION: 1. No acute findings. 2. Cholelithiasis but no sonographic evidence acute cholecystitis. 3. No bile duct dilation. Electronically Signed   By: Amie Portland M.D.   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  Suspect intermittent chronic low-grade GI blood loss secondary to small bowel AVMs  #2 adenomatous colon polyps-very recent outpatient colonoscopy through [facility] about 6 weeks ago and told to have 5-year follow-up.  We do not have that report  #3 cholelithiasis #4 parathyroid adenoma #5 folate deficiency #6 elevated alk phos- likely bone origin-to see endocrinology as  outpatient  Plan; n.p.o. after midnight Has been scheduled for enteroscopy tomorrow morning 7:30 AM with Dr. Tomasa Rand to treat any visualized AVMs.  Procedure was discussed in detail with the patient including indications risk benefits and she is agreeable to proceed. Tomorrow is a dialysis today for her, hopefully dialysis can be completed later in the day and she still may be able to be discharged afternoon tomorrow.  Outpatient follow-up for GI will be with [facility]    Principal Problem:   Acute on chronic anemia Active Problems:   Absolute anemia   AVM (arteriovenous malformation) of small bowel, acquired   Heme positive stool   Symptomatic anemia   ESRD (end stage renal disease) on dialysis [facility])   Parathyroid adenoma     LOS: 0 days   [name] PA-C 10/19/2022, 3:07 PM

## 2022-10-19 NOTE — Plan of Care (Signed)
Problem: Education: Goal: Knowledge of General Education information will improve Description: Including pain rating scale, medication(s)/side effects and non-pharmacologic comfort measures 10/19/2022 0802 by Doyce Para, RN Outcome: Progressing 10/19/2022 0802 by Doyce Para, RN Reactivated 10/19/2022 0801 by Doyce Para, RN Outcome: Adequate for Discharge 10/19/2022 0801 by Doyce Para, RN Outcome: Progressing   Problem: Health Behavior/Discharge Planning: Goal: Ability to manage health-related needs will improve 10/19/2022 0802 by Doyce Para, RN Outcome: Progressing 10/19/2022 0802 by Doyce Para, RN Reactivated 10/19/2022 0801 by Doyce Para, RN Outcome: Adequate for Discharge 10/19/2022 0801 by Doyce Para, RN Outcome: Progressing   Problem: Clinical Measurements: Goal: Ability to maintain clinical measurements within normal limits will improve 10/19/2022 0802 by Doyce Para, RN Outcome: Progressing 10/19/2022 0802 by Doyce Para, RN Reactivated 10/19/2022 0801 by Doyce Para, RN Outcome: Adequate for Discharge 10/19/2022 0801 by Doyce Para, RN Outcome: Progressing Goal: Will remain free from infection 10/19/2022 0802 by Doyce Para, RN Outcome: Progressing 10/19/2022 0802 by Doyce Para, RN Reactivated 10/19/2022 0801 by Doyce Para, RN Outcome: Adequate for Discharge 10/19/2022 0801 by Doyce Para, RN Outcome: Progressing Goal: Diagnostic test results will improve 10/19/2022 0802 by Doyce Para, RN Outcome: Progressing 10/19/2022 0802 by Doyce Para, RN Reactivated 10/19/2022 0801 by Doyce Para, RN Outcome: Adequate for Discharge 10/19/2022 0801 by Doyce Para, RN Outcome: Progressing Goal: Respiratory complications will improve 10/19/2022 0802 by Doyce Para, RN Outcome: Progressing 10/19/2022 0802 by  Doyce Para, RN Reactivated 10/19/2022 0801 by Doyce Para, RN Outcome: Adequate for Discharge 10/19/2022 0801 by Doyce Para, RN Outcome: Progressing Goal: Cardiovascular complication will be avoided 10/19/2022 0802 by Doyce Para, RN Outcome: Progressing 10/19/2022 0802 by Doyce Para, RN Reactivated 10/19/2022 0801 by Doyce Para, RN Outcome: Adequate for Discharge 10/19/2022 0801 by Doyce Para, RN Outcome: Progressing   Problem: Activity: Goal: Risk for activity intolerance will decrease 10/19/2022 0802 by Doyce Para, RN Outcome: Progressing 10/19/2022 0802 by Doyce Para, RN Reactivated 10/19/2022 0801 by Doyce Para, RN Outcome: Adequate for Discharge 10/19/2022 0801 by Doyce Para, RN Outcome: Progressing   Problem: Nutrition: Goal: Adequate nutrition will be maintained 10/19/2022 0802 by Doyce Para, RN Outcome: Progressing 10/19/2022 0802 by Doyce Para, RN Reactivated 10/19/2022 0801 by Doyce Para, RN Outcome: Adequate for Discharge 10/19/2022 0801 by Doyce Para, RN Outcome: Progressing   Problem: Coping: Goal: Level of anxiety will decrease 10/19/2022 0802 by Doyce Para, RN Outcome: Progressing 10/19/2022 0802 by Doyce Para, RN Reactivated 10/19/2022 0801 by Doyce Para, RN Outcome: Adequate for Discharge 10/19/2022 0801 by Doyce Para, RN Outcome: Progressing   Problem: Elimination: Goal: Will not experience complications related to bowel motility 10/19/2022 0802 by Doyce Para, RN Outcome: Progressing 10/19/2022 0802 by Doyce Para, RN Reactivated 10/19/2022 0801 by Doyce Para, RN Outcome: Adequate for Discharge 10/19/2022 0801 by Doyce Para, RN Outcome: Progressing Goal: Will not experience complications related to urinary retention 10/19/2022 0802 by Doyce Para,  RN Outcome: Progressing 10/19/2022 0802 by Doyce Para, RN Reactivated 10/19/2022 0801 by Doyce Para, RN Outcome: Adequate for Discharge 10/19/2022 0801 by Doyce Para, RN Outcome: Progressing   Problem: Pain Managment: Goal: General experience of comfort will improve 10/19/2022 0802 by Doyce Para, RN Outcome: Progressing 10/19/2022 0802 by Doyce Para, RN Reactivated 10/19/2022 0801 by Doyce Para, RN Outcome: Adequate for Discharge 10/19/2022 0801 by Doyce Para, RN Outcome: Progressing   Problem: Safety: Goal: Ability to remain free from injury will improve 10/19/2022 0802 by Doyce Para, RN Outcome: Progressing 10/19/2022 0802 by Doyce Para, RN Reactivated 10/19/2022 0801  by Doyce Para, RN Outcome: Adequate for Discharge 10/19/2022 0801 by Doyce Para, RN Outcome: Progressing   Problem: Skin Integrity: Goal: Risk for impaired skin integrity will decrease 10/19/2022 0802 by Doyce Para, RN Outcome: Progressing 10/19/2022 0802 by Doyce Para, RN Reactivated 10/19/2022 0801 by Doyce Para, RN Outcome: Adequate for Discharge 10/19/2022 0801 by Doyce Para, RN Outcome: Progressing

## 2022-10-19 NOTE — Progress Notes (Signed)
Pt receives out-pt HD at [facility] on TTS. Will assist as needed.   Olivia Canter Renal Navigator [phone]

## 2022-10-19 NOTE — Progress Notes (Signed)
Admit: 10/17/2022 LOS: 0  61F ESRD THS GOC with subacute progressive anemia from slow GI bleed  Subjective:  Undergoing capsule endoscopy Has received 2 units PRBCs No complaints this morning, feels improved  No intake/output data recorded.  Filed Weights   10/17/22 0807 10/18/22 0542  Weight: 54 kg 54.6 kg    Scheduled Meds:  atorvastatin  80 mg Oral Daily   capsaicin   Topical TID   carvedilol  25 mg Oral BID WC   folic acid  1 mg Oral Daily   pantoprazole  40 mg Oral Daily   Continuous Infusions: PRN Meds:.mouth rinse  Current Labs: reviewed    Physical Exam:  Blood pressure (!) 173/64, pulse 65, temperature 98.1 F (36.7 C), temperature source Oral, resp. rate 18, height 5\' 6"  (1.676 m), weight 54.6 kg, SpO2 100%. NAD, sitting at edge of bed Regular, normal S1 and S2, no murmur or rub Clear bilaterally No peripheral edema Right upper extremity AV graft with bruit and thrill Nonfocal, A and O x3 Soft, nontender  Dialysis Orders:  TTS at [facility] 3:45hr, 180 dialyzer, 400/700, EDW 54.5kg, 3K/2.5Ca bath, RUE AVG, no heparin - Mircera IV q 2 weeks (dose just raised - last given on 10/08/22) - Venofer 50mg  IV weekly - Calcitriol 0.36mcg PO q HD - just restarted after long hold for hyperCa (PTH 1897) - Home BMM meds: Auryxia 2/meals, sensipar 180mg  daily   Assessment/Plan:  Acute on chronic anemia: Hgb 6.4 on admit, + FOBT. Suspect slow GIB in addition to her ESRD related anemia. S/p 2U PRBCs. Her ESA has already been increased to for her next dose (due on 9/19). GI consulted, she is undergoing capsule endoscopy may be resulting today, then plan at that time - may need EGD.  ESRD:  Continue HD per usual TTS schedule - next HD 9/17.  No heparin.    Hypertension/volume: BP decent, volume seems ok despite blood products - monitor.  Metabolic bone disease: Ca/Phos fine here - resume home meds once allowed to eat full diet.  T2DM   Medication  Issues; Preferred narcotic agents for pain control are hydromorphone, fentanyl, and methadone. Morphine should not be used.  Baclofen should be avoided Avoid oral sodium phosphate and magnesium citrate based laxatives / bowel preps    Sabra Heck MD 10/19/2022, 9:31 AM  Recent Labs  Lab 10/17/22 0810 10/18/22 0652  NA 137 135  K 3.5 3.8  CL 98 101  CO2 29 25  GLUCOSE 139* 94  BUN 8 20  CREATININE 2.68* 4.42*  CALCIUM 9.2 8.9  PHOS  --  4.9*   Recent Labs  Lab 10/17/22 0810 10/17/22 1758 10/18/22 0652  WBC 6.2 6.4 5.6  NEUTROABS 4.2  --   --   HGB 6.4* 8.3* 7.9*  HCT 21.2* 25.5* 24.9*  MCV 97.2 92.7 92.9  PLT 262 224 229

## 2022-10-20 ENCOUNTER — Observation Stay (HOSPITAL_BASED_OUTPATIENT_CLINIC_OR_DEPARTMENT_OTHER): Payer: Medicare HMO | Admitting: Anesthesiology

## 2022-10-20 ENCOUNTER — Observation Stay (HOSPITAL_COMMUNITY): Payer: Medicare HMO | Admitting: Anesthesiology

## 2022-10-20 ENCOUNTER — Other Ambulatory Visit (HOSPITAL_COMMUNITY): Payer: Self-pay

## 2022-10-20 ENCOUNTER — Encounter (HOSPITAL_COMMUNITY)
Admission: EM | Disposition: A | Discharge status: Home / Self Care | Payer: Self-pay | Source: Home / Self Care | Attending: Emergency Medicine

## 2022-10-20 ENCOUNTER — Encounter (HOSPITAL_COMMUNITY): Payer: Self-pay

## 2022-10-20 ENCOUNTER — Encounter (HOSPITAL_COMMUNITY): Payer: Self-pay | Admitting: Internal Medicine

## 2022-10-20 DIAGNOSIS — F17210 Nicotine dependence, cigarettes, uncomplicated: Secondary | ICD-10-CM

## 2022-10-20 DIAGNOSIS — K31811 Angiodysplasia of stomach and duodenum with bleeding: Secondary | ICD-10-CM | POA: Diagnosis not present

## 2022-10-20 DIAGNOSIS — D1339 Benign neoplasm of other parts of small intestine: Secondary | ICD-10-CM | POA: Diagnosis not present

## 2022-10-20 DIAGNOSIS — I509 Heart failure, unspecified: Secondary | ICD-10-CM

## 2022-10-20 DIAGNOSIS — K31819 Angiodysplasia of stomach and duodenum without bleeding: Secondary | ICD-10-CM | POA: Diagnosis not present

## 2022-10-20 DIAGNOSIS — I110 Hypertensive heart disease with heart failure: Secondary | ICD-10-CM | POA: Diagnosis not present

## 2022-10-20 DIAGNOSIS — K635 Polyp of colon: Secondary | ICD-10-CM

## 2022-10-20 DIAGNOSIS — D649 Anemia, unspecified: Secondary | ICD-10-CM | POA: Diagnosis not present

## 2022-10-20 HISTORY — PX: ENTEROSCOPY: SHX5533

## 2022-10-20 HISTORY — PX: HOT HEMOSTASIS: SHX5433

## 2022-10-20 HISTORY — PX: POLYPECTOMY: SHX5525

## 2022-10-20 LAB — POCT I-STAT, CHEM 8
BUN: 50 mg/dL — ABNORMAL HIGH (ref 8–23)
Calcium, Ion: 1.2 mmol/L (ref 1.15–1.40)
Chloride: 106 mmol/L (ref 98–111)
Creatinine, Ser: 7.8 mg/dL — ABNORMAL HIGH (ref 0.44–1.00)
Glucose, Bld: 123 mg/dL — ABNORMAL HIGH (ref 70–99)
HCT: 24 % — ABNORMAL LOW (ref 36.0–46.0)
Hemoglobin: 8.2 g/dL — ABNORMAL LOW (ref 12.0–15.0)
Potassium: 4.6 mmol/L (ref 3.5–5.1)
Sodium: 138 mmol/L (ref 135–145)
TCO2: 20 mmol/L — ABNORMAL LOW (ref 22–32)

## 2022-10-20 LAB — CBC
HCT: 26.6 % — ABNORMAL LOW (ref 36.0–46.0)
Hemoglobin: 8.7 g/dL — ABNORMAL LOW (ref 12.0–15.0)
MCH: 30.9 pg (ref 26.0–34.0)
MCHC: 32.7 g/dL (ref 30.0–36.0)
MCV: 94.3 fL (ref 80.0–100.0)
Platelets: 232 10*3/uL (ref 150–400)
RBC: 2.82 MIL/uL — ABNORMAL LOW (ref 3.87–5.11)
RDW: 14.2 % (ref 11.5–15.5)
WBC: 6.1 10*3/uL (ref 4.0–10.5)
nRBC: 0 % (ref 0.0–0.2)

## 2022-10-20 LAB — HEPATITIS B SURFACE ANTIBODY, QUANTITATIVE: Hep B S AB Quant (Post): 53.9 m[IU]/mL

## 2022-10-20 SURGERY — ENTEROSCOPY
Anesthesia: Monitor Anesthesia Care

## 2022-10-20 MED ORDER — LIDOCAINE-PRILOCAINE 2.5-2.5 % EX CREA: 1.0000 | TOPICAL_CREAM | CUTANEOUS | Status: DC | PRN

## 2022-10-20 MED ORDER — EPHEDRINE SULFATE-NACL 50-0.9 MG/10ML-% IV SOSY
PREFILLED_SYRINGE | INTRAVENOUS | Status: DC | PRN
  Administered 2022-10-20 (×3): 5 mg via INTRAVENOUS

## 2022-10-20 MED ORDER — PROPOFOL 10 MG/ML IV BOLUS
INTRAVENOUS | Status: DC | PRN
  Administered 2022-10-20: 80 mg via INTRAVENOUS

## 2022-10-20 MED ORDER — SODIUM CHLORIDE 0.9 % IV SOLN: INTRAVENOUS | Status: DC

## 2022-10-20 MED ORDER — LIDOCAINE 2% (20 MG/ML) 5 ML SYRINGE
INTRAMUSCULAR | Status: DC | PRN
  Administered 2022-10-20: 80 mg via INTRAVENOUS

## 2022-10-20 MED ORDER — GLYCOPYRROLATE PF 0.2 MG/ML IJ SOSY
PREFILLED_SYRINGE | INTRAMUSCULAR | Status: DC | PRN
  Administered 2022-10-20: .1 mg via INTRAVENOUS

## 2022-10-20 MED ORDER — PROPOFOL 500 MG/50ML IV EMUL
INTRAVENOUS | Status: DC | PRN
  Administered 2022-10-20: 150 ug/kg/min via INTRAVENOUS

## 2022-10-20 MED ORDER — SUCCINYLCHOLINE CHLORIDE 200 MG/10ML IV SOSY
PREFILLED_SYRINGE | INTRAVENOUS | Status: DC | PRN
  Administered 2022-10-20: 120 mg via INTRAVENOUS

## 2022-10-20 MED ORDER — FOLIC ACID 1 MG PO TABS
1.0000 mg | ORAL_TABLET | Freq: Every day | ORAL | 1 refills | Status: DC
  Filled 2022-10-20: qty 30, 30d supply, fill #0

## 2022-10-20 MED ORDER — ONDANSETRON HCL 4 MG/2ML IJ SOLN
INTRAMUSCULAR | Status: DC | PRN
  Administered 2022-10-20: 4 mg via INTRAVENOUS

## 2022-10-20 MED ORDER — PENTAFLUOROPROP-TETRAFLUOROETH EX AERO: 1.0000 | INHALATION_SPRAY | CUTANEOUS | Status: DC | PRN

## 2022-10-20 MED ORDER — CAPSAICIN 0.025 % EX CREA
TOPICAL_CREAM | Freq: Three times a day (TID) | CUTANEOUS | 0 refills | Status: DC | PRN
  Filled 2022-10-20: qty 60, 30d supply, fill #0

## 2022-10-20 MED ORDER — LIDOCAINE HCL (PF) 1 % IJ SOLN: 5.0000 mL | INTRAMUSCULAR | Status: DC | PRN

## 2022-10-20 NOTE — Discharge Summary (Signed)
Name: Sara Davis MRN: [id number] DOB: 09/07/1950 72 y.o. PCP: Loura Back, NP  Date of Admission: 10/17/2022  8:00 AM Date of Discharge: 10/20/22 Attending Physician: Dr. Antony Contras  Discharge Diagnosis: Principal Problem:   Acute on chronic anemia Active Problems:   Absolute anemia   AVM (arteriovenous malformation) of small bowel, acquired   Heme positive stool   Symptomatic anemia   ESRD (end stage renal disease) on dialysis [facility])   Parathyroid adenoma   Benign neoplasm of jejunum    Discharge Medications: Allergies as of 10/20/2022       Reactions   Aspirin Other (See Comments)   Upset stomach, can only take coated aspirin but prefers to not take it at all        Medication List     TAKE these medications    Accu-Chek Aviva Plus test strip Generic drug: glucose blood Use as instructed   accu-chek soft touch lancets Use as instructed   atorvastatin 80 MG tablet Commonly known as: LIPITOR Take 1 tablet (80 mg total) by mouth daily.   Auryxia 1 GM 210 MG(Fe) tablet Generic drug: ferric citrate Take 420 mg by mouth 3 (three) times daily.   B-D SINGLE USE SWABS REGULAR Pads 1 Units by Does not apply route 3 (three) times daily as needed.   capsaicin 0.025 % cream Commonly known as: ZOSTRIX Apply topically 3 (three) times daily as needed. Apply a small amount to knees three times daily as needed   carvedilol 25 MG tablet Commonly known as: COREG Take 1 tablet (25 mg total) by mouth 2 (two) times daily with a meal.   cinacalcet 60 MG tablet Commonly known as: SENSIPAR Take 180 mg by mouth daily.   DIALYVITE TABLET Tabs Take 1 tablet by mouth daily.   diltiazem 120 MG 12 hr capsule Commonly known as: CARDIZEM SR Take 1 capsule (120 mg total) by mouth 2 (two) times daily.   folic acid 1 MG tablet Commonly known as: FOLVITE Take 1 tablet (1 mg total) by mouth daily.   pantoprazole 40 MG tablet Commonly known as: PROTONIX Take 1 tablet (40 mg  total) by mouth daily for 28 days.        Disposition and follow-up:   Ms.Tremaine JORIE BETZ was discharged from [facility] in Stable condition.  At the hospital follow up visit please address:  1.  Follow-up:  a. Acute on Chronic Anemia, GI bleed s/p push enteroscopy - pending polyp biopsy    b. Hyperparathyroidism, Thyroid adenoma - seeing [facility] (not scheduled yet)   c. ESRD continue outpatient dialysis starting Thursday 9/19   d. Acute on chronic anemia Iron & Folate Deficiency, continued outpatient iron regimen and started daily folic acid  2.  Labs / imaging needed at time of follow-up: none  3.  Pending labs/ test needing follow-up: CBC, follow Hgb  4.  Medication Changes  Started daily folic acid  Follow-up Appointments:  Follow-up Information     Talmage Coin, MD Follow up.   Specialty: Endocrinology Why: Office will call and schedule an appointment with you. If you don't hear anything from office by next week, call their number to schedule an appointment. Contact information: 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 On admission, Hgb <7 and stable after 2 units PRBC.  GI will f/u pathology result for jejunal polyp Continue home iron AURYXIA for iron deficiency Started on folic acid 1 mg once daily Repeat CBC outpatient   ESRD Received inpatient dialysis 9/17. Continue outpatient on 9/19  #Hyperparathryoidism  #Elevated Alkaline phosphatase #Parathyroid adenoma #Osteopenia Adenoma seen on nuclear medicine study recently.  Workup initiated prior to admission for elevated alk phos (800) and hyperparathyroidism.  Alkaline phosphatase likely of bone  origin.  DEXA from 2021 suggests osteopenia. PTH level very high >2000. Endocrinology outpatient scheduled.   Discharge Subjective: On the day of discharge the patient reports no concerns. We informed her of the EGD results and that we would moniter her today and discharge her tomorrow with a stable Hgb. The patient was upset by this and asked to be discharged today if possible. GI is aware and okay with patient going today following stable Hgb. Pt will follow up with PCP.   Discharge Exam:   BP 130/60   Pulse 70   Temp 98.1 F (36.7 C)   Resp 14   Ht 5\' 6"  (1.676 m)   Wt 54.6 kg   SpO2 100%   BMI 19.43 kg/m  Constitutional: well-appearing, in no acute distress HENT: normocephalic atraumatic, mucous membranes moist Eyes: conjunctiva non-erythematous Neck: supple Cardiovascular: regular rate and rhythm, no m/r/g Pulmonary/Chest: normal work of breathing on room air, trace bilateral basalar crackles Abdominal: soft, non-tender, non-distended MSK: normal bulk and tone Neurological: alert & oriented x 3, 5/5 strength in bilateral upper and lower extremities, normal gait Skin: warm and dry  Pertinent Labs, Studies, and Procedures:     Latest Ref Rng & Units 10/20/2022    7:23 AM 10/19/2022    8:01 AM 10/18/2022    6:52 AM  CBC  WBC 4.0 - 10.5 K/uL  5.8  5.6   Hemoglobin 12.0 - 15.0 g/dL 8.2  8.1  7.9   Hematocrit 36.0 - 46.0 % 24.0  25.4  24.9   Platelets 150 - 400 K/uL  215  229        Latest Ref Rng & Units 10/20/2022    7:23 AM 10/19/2022    8:01 AM 10/18/2022    6:52 AM  CMP  Glucose 70 - 99 mg/dL 578  97  94   BUN 8 - 23 mg/dL 50  37  20   Creatinine 0.44 - 1.00 mg/dL 4.69  6.29  5.28   Sodium 135 - 145 mmol/L 138  139  135   Potassium 3.5 - 5.1 mmol/L 4.6  4.3  3.8   Chloride 98 - 111 mmol/L 106  104  101   CO2 22 - 32 mmol/L  23  25   Calcium 8.9 - 10.3 mg/dL  9.2  8.9     US ABDOMEN LIMITED RUQ (LIVER/GB)  Result Date: 10/17/2022 CLINICAL DATA:  Elevated  alkaline phosphatase. EXAM: ULTRASOUND ABDOMEN LIMITED RIGHT UPPER QUADRANT COMPARISON:  CT abdomen and pelvis, 08/08/2021. FINDINGS: Gallbladder: 2 cm stone in gallbladder fundus. No gallbladder wall thickening or pericholecystic fluid. Patient is tender to transducer pressure over the gallbladder. Common bile duct: Diameter: 5 mm Liver: No focal lesion identified. Within normal limits in parenchymal echogenicity. Portal vein is patent on color Doppler imaging with normal direction of blood flow towards the liver. Other: None. IMPRESSION: 1. No acute findings. 2. Cholelithiasis but no sonographic evidence acute cholecystitis. 3. No  bile duct dilation. Electronically Signed   By: Amie Portland M.D.   On: 10/17/2022 16:02   DG Chest 2 View  Result Date: 10/17/2022 CLINICAL DATA:  Shortness of breath. Patient presented to the emergency department from dialysis after she was told her hemoglobin was too low. EXAM: CHEST - 2 VIEW COMPARISON:  06/15/2022 FINDINGS: Heart size is normal. Low lung volumes. Chronic coarsened interstitial markings. No pleural fluid, airspace consolidation or frank interstitial edema. No bone abnormalities. IMPRESSION: 1. No acute findings. 2. Chronic interstitial lung disease. Electronically Signed   By: Signa Kell M.D.   On: 10/17/2022 08:52     Discharge Instructions: Discharge Instructions     Ambulatory referral to Endocrinology   Complete by: As directed        Signed: Meryl Dare, MD PGY-1 Psychiatry Resident 10/20/2022, 12:35 PM

## 2022-10-20 NOTE — Anesthesia Postprocedure Evaluation (Signed)
Anesthesia Post Note  Patient: Sara Davis  Procedure(s) Performed: ENTEROSCOPY HOT HEMOSTASIS (ARGON PLASMA COAGULATION/BICAP) POLYPECTOMY     Patient location during evaluation: PACU Anesthesia Type: General Level of consciousness: awake and alert Pain management: pain level controlled Vital Signs Assessment: post-procedure vital signs reviewed and stable Respiratory status: spontaneous breathing, nonlabored ventilation, respiratory function stable and patient connected to nasal cannula oxygen Cardiovascular status: blood pressure returned to baseline and stable Postop Assessment: no apparent nausea or vomiting Anesthetic complications: no  No notable events documented.  Last Vitals:  Vitals:   10/20/22 1000 10/20/22 1030  BP: (!) 170/62 (!) 162/57  Pulse: 65 70  Resp: 15 18  Temp:    SpO2: 99% 99%    Last Pain:  Vitals:   10/20/22 0910  TempSrc:   PainSc: 0-No pain                 Telvin Reinders S

## 2022-10-20 NOTE — Plan of Care (Signed)

## 2022-10-20 NOTE — Transfer of Care (Signed)
Immediate Anesthesia Transfer of Care Note  Patient: Sara Davis  Procedure(s) Performed: ENTEROSCOPY HOT HEMOSTASIS (ARGON PLASMA COAGULATION/BICAP) POLYPECTOMY  Patient Location: Endoscopy Unit  Anesthesia Type:General  Level of Consciousness: awake  Airway & Oxygen Therapy: Patient Spontanous Breathing  Post-op Assessment: Report given to RN and Post -op Vital signs reviewed and stable  Post vital signs: Reviewed and stable  Last Vitals:  Vitals Value Taken Time  BP 141/58 10/20/22 0840  Temp 36.7 C 10/20/22 0836  Pulse 74 10/20/22 0843  Resp 17 10/20/22 0843  SpO2 95 % 10/20/22 0843  Vitals shown include unfiled device data.  Last Pain:  Vitals:   10/20/22 0836  TempSrc: Temporal  PainSc: 0-No pain      Patients Stated Pain Goal: 0 (10/17/22 1945)  Complications: No notable events documented.

## 2022-10-20 NOTE — Plan of Care (Signed)
[facility] Patient Discharge Orders- [facility] CLINIC: GOC  Patient's name: Sara Davis Admit/DC Dates: 10/17/2022 - 10/20/22  Discharge Diagnoses: Acute/chronic anemia 2/2 GIB  S/p endo with multiple AVMs   Aranesp: Given: --   Date and amount of last dose: --  Last Hgb: 8.7 PRBC's Given: Yes Date/# of units: 9/14, 2 units  ESA dose for discharge: Mircera 225 mcg IV q 2 weeks - due 9/19. IV Iron dose at discharge: --  Heparin change: No heparin   EDW Change: -- No change Bath Change: --  Access intervention/Change: -- -  Hectorol/Calcitriol change: --  Discharge Labs: Calcium 9.2 Phosphorus - Albumin - K+ 4.6  IV Antibiotics: No    On Coumadin: No   OTHER/APPTS/LAB ORDERS:    D/C Meds to be reconciled by nurse after every discharge.  Completed By: Tomasa Blase PA-C  Reviewed by: MD:______ RN_______

## 2022-10-20 NOTE — Anesthesia Preprocedure Evaluation (Signed)
Anesthesia Evaluation  Patient identified by MRN, date of birth, ID band Patient awake    Reviewed: Allergy & Precautions, H&P , NPO status , Patient's Chart, lab work & pertinent test results  Airway Mallampati: II  TM Distance: >3 FB Neck ROM: Full    Dental no notable dental hx.    Pulmonary neg pulmonary ROS, Current Smoker and Patient abstained from smoking.   Pulmonary exam normal breath sounds clear to auscultation       Cardiovascular hypertension, Normal cardiovascular exam Rhythm:Regular Rate:Normal     Neuro/Psych negative neurological ROS  negative psych ROS   GI/Hepatic negative GI ROS, Neg liver ROS,,,  Endo/Other  diabetes    Renal/GU ESRFRenal disease  negative genitourinary   Musculoskeletal negative musculoskeletal ROS (+)    Abdominal   Peds negative pediatric ROS (+)  Hematology  (+) Blood dyscrasia, anemia   Anesthesia Other Findings   Reproductive/Obstetrics negative OB ROS                             Anesthesia Physical Anesthesia Plan  ASA: 3  Anesthesia Plan: MAC   Post-op Pain Management: Minimal or no pain anticipated   Induction: Intravenous  PONV Risk Score and Plan: 2 and Propofol infusion and Treatment may vary due to age or medical condition  Airway Management Planned: Simple Face Mask  Additional Equipment:   Intra-op Plan:   Post-operative Plan:   Informed Consent: I have reviewed the patients History and Physical, chart, labs and discussed the procedure including the risks, benefits and alternatives for the proposed anesthesia with the patient or authorized representative who has indicated his/her understanding and acceptance.     Dental advisory given  Plan Discussed with: CRNA and Surgeon  Anesthesia Plan Comments:        Anesthesia Quick Evaluation

## 2022-10-20 NOTE — Plan of Care (Signed)
  Problem: Education: Goal: Knowledge of General Education information will improve Description: Including pain rating scale, medication(s)/side effects and non-pharmacologic comfort measures 10/20/2022 1436 by Doyce Para, RN Outcome: Adequate for Discharge 10/20/2022 1436 by Doyce Para, RN Outcome: Progressing   Problem: Health Behavior/Discharge Planning: Goal: Ability to manage health-related needs will improve 10/20/2022 1436 by Doyce Para, RN Outcome: Adequate for Discharge 10/20/2022 1436 by Doyce Para, RN Outcome: Progressing   Problem: Clinical Measurements: Goal: Ability to maintain clinical measurements within normal limits will improve 10/20/2022 1436 by Doyce Para, RN Outcome: Adequate for Discharge 10/20/2022 1436 by Doyce Para, RN Outcome: Progressing Goal: Will remain free from infection 10/20/2022 1436 by Doyce Para, RN Outcome: Adequate for Discharge 10/20/2022 1436 by Doyce Para, RN Outcome: Progressing Goal: Diagnostic test results will improve 10/20/2022 1436 by Doyce Para, RN Outcome: Adequate for Discharge 10/20/2022 1436 by Doyce Para, RN Outcome: Progressing Goal: Respiratory complications will improve 10/20/2022 1436 by Doyce Para, RN Outcome: Adequate for Discharge 10/20/2022 1436 by Doyce Para, RN Outcome: Progressing Goal: Cardiovascular complication will be avoided 10/20/2022 1436 by Doyce Para, RN Outcome: Adequate for Discharge 10/20/2022 1436 by Doyce Para, RN Outcome: Progressing   Problem: Activity: Goal: Risk for activity intolerance will decrease 10/20/2022 1436 by Doyce Para, RN Outcome: Adequate for Discharge 10/20/2022 1436 by Doyce Para, RN Outcome: Progressing   Problem: Nutrition: Goal: Adequate nutrition will be maintained 10/20/2022 1436 by Doyce Para, RN Outcome: Adequate for  Discharge 10/20/2022 1436 by Doyce Para, RN Outcome: Progressing   Problem: Coping: Goal: Level of anxiety will decrease 10/20/2022 1436 by Doyce Para, RN Outcome: Adequate for Discharge 10/20/2022 1436 by Doyce Para, RN Outcome: Progressing   Problem: Elimination: Goal: Will not experience complications related to bowel motility 10/20/2022 1436 by Doyce Para, RN Outcome: Adequate for Discharge 10/20/2022 1436 by Doyce Para, RN Outcome: Progressing Goal: Will not experience complications related to urinary retention 10/20/2022 1436 by Doyce Para, RN Outcome: Adequate for Discharge 10/20/2022 1436 by Doyce Para, RN Outcome: Progressing   Problem: Pain Managment: Goal: General experience of comfort will improve 10/20/2022 1436 by Doyce Para, RN Outcome: Adequate for Discharge 10/20/2022 1436 by Doyce Para, RN Outcome: Progressing   Problem: Safety: Goal: Ability to remain free from injury will improve 10/20/2022 1436 by Doyce Para, RN Outcome: Adequate for Discharge 10/20/2022 1436 by Doyce Para, RN Outcome: Progressing   Problem: Skin Integrity: Goal: Risk for impaired skin integrity will decrease 10/20/2022 1436 by Doyce Para, RN Outcome: Adequate for Discharge 10/20/2022 1436 by Doyce Para, RN Outcome: Progressing

## 2022-10-20 NOTE — Progress Notes (Addendum)
Admit: 10/17/2022 LOS: 0  22F ESRD THS GOC with subacute progressive anemia from slow GI bleed  Subjective: Undergoing capsule endoscopy. SP 2 units PRBCs. No c/o today. Feels improved  No intake/output data recorded.  Filed Weights   10/20/22 0704 10/20/22 0915 10/20/22 1345  Weight: 54.6 kg 54.6 kg 52.6 kg    Scheduled Meds:  atorvastatin  80 mg Oral Daily   capsaicin   Topical TID   carvedilol  25 mg Oral BID WC   folic acid  1 mg Oral Daily   pantoprazole  40 mg Oral Daily   Continuous Infusions: PRN Meds:.mouth rinse  Current Labs: reviewed    Physical Exam:  Blood pressure (!) 159/60, pulse 72, temperature 97.9 F (36.6 C), resp. rate 17, height 5\' 6"  (1.676 m), weight 52.6 kg, SpO2 100%. NAD, sitting at edge of bed Regular, normal S1 and S2, no murmur or rub Clear bilaterally No peripheral edema Right upper extremity AV graft with bruit and thrill Nonfocal, A and O x3 Soft, nontender  OP HD:  TTS at GOC  3h  400/700   54.5kg  3K/2.5Ca bath  R AVG  Hep none - mircera IV q 2 weeks, last given 9/5, due 9/19 - venofer 50mg  IV weekly - calcitriol 0.52mcg PO q HD - just restarted after long hold for hyperCa (PTH 1897) - home BMM meds: Auryxia 2/meals, sensipar 180mg  daily   Assessment/Plan:  Acute on chronic anemia - Hgb 6.4 on admit, + FOBT. Suspect slow GIB in addition to her ESRD related anemia. S/p 2U PRBCs.  GI consulting and going for SB enteroscopy today. Possible dc today.   ESRD - cont HD TTS. HD was done today.   Hypertension/volume: BP decent, volume seems ok despite blood products. Follow.   Metabolic bone disease: Ca/Phos fine here - resume home meds once allowed to eat full diet.  Anemia of esrd - Hb here 7- 9 range. Due for next esa on 9/19, which she should be getting in the OP setting.   T2DM    Sara Moselle  MD  [facility] 10/20/2022, 2:54 PM  Recent Labs  Lab 10/18/22 0652 10/19/22 0801 10/20/22 0723 10/20/22 1200  HGB 7.9*  8.1* 8.2* 8.7*  ALBUMIN 3.0* 2.8*  --   --   CALCIUM 8.9 9.2  --   --   PHOS 4.9* 5.2*  --   --   CREATININE 4.42* 5.73* 7.80*  --   K 3.8 4.3 4.6  --     Inpatient medications:  atorvastatin  80 mg Oral Daily   capsaicin   Topical TID   carvedilol  25 mg Oral BID WC   folic acid  1 mg Oral Daily   pantoprazole  40 mg Oral Daily    mouth rinse

## 2022-10-20 NOTE — Op Note (Signed)
[facility] Patient Name: Sara Davis Procedure Date : 10/20/2022 MRN: [id number] Attending MD: Dub Amis. Tomasa Rand , MD, [id number] Date of Birth: 1950-12-07 CSN: [id number] Age: 72 Admit Type: Inpatient Procedure:                Small bowel enteroscopy Indications:              Arteriovenous malformation in the small intestine Providers:                Lorin Picket E. Tomasa Rand, MD, Jacquelyn "Jaci" Clelia Croft, RN,                            Marja Kays, Technician, Geralyn Corwin, RN Referring MD:              Medicines:                Monitored Anesthesia Care, General Anesthesia Complications:            No immediate complications. Estimated Blood Loss:     Estimated blood loss was minimal. Procedure:                Pre-Anesthesia Assessment:                           - Prior to the procedure, a History and Physical                            was performed, and patient medications and                            allergies were reviewed. The patient's tolerance of                            previous anesthesia was also reviewed. The risks                            and benefits of the procedure and the sedation                            options and risks were discussed with the patient.                            All questions were answered, and informed consent                            was obtained. Prior Anticoagulants: The patient has                            taken no anticoagulant or antiplatelet agents. ASA                            Grade Assessment: III - A patient with severe                            systemic disease. After reviewing the risks and  benefits, the patient was deemed in satisfactory                            condition to undergo the procedure.                           After obtaining informed consent, the endoscope was                            passed under direct vision. Throughout the                             procedure, the patient's blood pressure, pulse, and                            oxygen saturations were monitored continuously. The                            PCF-H190TL ([id number] Olympus slim colonoscope was                            introduced through the mouth and advanced to the                            jejunum, to the 150 cm mark (from the incisors).                            The small bowel enteroscopy was accomplished                            without difficulty. The patient tolerated the                            procedure well. Upon initial insertion of the                            enteroscope, a medium amount of food residue was                            seen in the stomach. The patient was also already                            coughing and retching very early in the procedure.                            To reduce the risk of aspiration, the decision was                            made to convert to general anesthesia. The                            enteroscope was withdrawn and rapid sequence  intubation was performed by the anesthesia team.                            The enteroscope was reinserted and the procedure                            was completed without further difficulty. Scope In: Scope Out: Findings:      The examined esophagus was normal.      A medium amount of food (residue) was found in the gastric body.      The exam of the stomach was otherwise normal.      There was no evidence of significant pathology in the entire examined       duodenum.      Three angiodysplastic lesions with no bleeding were found in the       proximal jejunum and in the mid-jejunum. Coagulation for hemostasis       using argon plasma was successful. Estimated blood loss was minimal.      A 6 mm sessile polyp with no bleeding was found in the mid-jejunum. The       polyp was removed with a cold snare. Resection and retrieval were       complete.  Estimated blood loss was minimal. The polypectomy site was       treated with argon plasma coagulation to reduce risk of post polypectomy       bleeding and destroy any possible remnant polyp tissue.      Exam of the jejunum was otherwise normal. Impression:               - Normal esophagus.                           - A medium amount of food (residue) in the stomach.                           - Normal examined duodenum.                           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 Treated with argon plasma coagulation                            (APC).                           - Jejunal polyp. Resected and retrieved.                           - No active or old blood in the examined stomach                            and small intestine. Moderate Sedation:      N/A Recommendation:           - Return patient to hospital ward for ongoing care.                           -  Await pathology results.                           - Resume regular diet today.                           - Trend CBC daily.                           - Ok for discharge home tomorrow if stable                            hemoglobin and no further concerns for rebleeding.                           - GI will sign off at this time.                           - Will follow up with patient on pathology results                            of resected polyp. Procedure Code(s):        --- Professional ---                           [phone], 51,59, Small intestinal endoscopy,                            enteroscopy beyond second portion of duodenum, not                            including ileum; with control of bleeding (eg,                            injection, bipolar cautery, unipolar cautery,                            laser, heater probe, stapler, plasma coagulator)                           44364, Small intestinal endoscopy, enteroscopy                            beyond  second portion of duodenum, not including                            ileum; with removal of tumor(s), polyp(s), or other                            lesion(s) by snare technique Diagnosis Code(s):        --- Professional ---                           W10.272, Angiodysplasia of stomach and duodenum  without bleeding                           D13.39, Benign neoplasm of other parts of small                            intestine CPT copyright 2022 [employer]. All rights reserved. The codes documented in this report are preliminary and upon coder review may  be revised to meet current compliance requirements. Herson Prichard E. Tomasa Rand, MD 10/20/2022 8:35:25 AM This report has been signed electronically. Number of Addenda: 0

## 2022-10-20 NOTE — Progress Notes (Signed)
   10/20/22 1345  Vitals  Temp (!) 96.8 F (36 C)  Pulse Rate 65  Resp 17  BP (!) 162/55  SpO2 100 %  O2 Device Room Air  Weight 52.6 kg  Type of Weight Post-Dialysis  Post Treatment  Dialyzer Clearance Lightly streaked  Hemodialysis Intake (mL) 0 mL  Liters Processed 90  Fluid Removed (mL) 2000 mL  Tolerated HD Treatment Yes  AVG/AVF Arterial Site Held (minutes) 10 minutes  AVG/AVF Venous Site Held (minutes) 10 minutes   Received patient in bed to unit.  Alert and oriented.  Informed consent signed and in chart.   TX duration:3.45  Patient tolerated well.  Transported back to the room  Alert, without acute distress.  Hand-off given to patient's nurse.   Access used: RUAG Access issues: no complications  Total UF removed: 2000 Medication(s) given: none   [facility]

## 2022-10-20 NOTE — Interval H&P Note (Signed)
History and Physical Interval Note:  10/20/2022 7:26 AM  Sara Davis  has presented today for surgery, with the diagnosis of Iron deficiency anemia, heme positive stool, history of AVMs.  The various methods of treatment have been discussed with the patient and family. After consideration of risks, benefits and other options for treatment, the patient has consented to  Procedure(s): ENTEROSCOPY (N/A) as a surgical intervention.  The patient's history has been reviewed, patient examined, no change in status, stable for surgery.  I have reviewed the patient's chart and labs.  Questions were answered to the patient's satisfaction.     Jenel Lucks

## 2022-10-20 NOTE — Anesthesia Procedure Notes (Signed)
Procedure Name: Intubation Date/Time: 10/20/2022 7:44 AM  Performed by: Loleta Yaron Grasse, CRNAPre-anesthesia Checklist: Patient identified, Patient being monitored, Timeout performed, Emergency Drugs available and Suction available Patient Re-evaluated:Patient Re-evaluated prior to induction Oxygen Delivery Method: Circle system utilized Preoxygenation: Pre-oxygenation with 100% oxygen Induction Type: IV induction Ventilation: Mask ventilation without difficulty Laryngoscope Size: Mac and 4 Grade View: Grade II Tube type: Oral Tube size: 6.5 mm Number of attempts: 1 Airway Equipment and Method: Stylet Placement Confirmation: ETT inserted through vocal cords under direct vision, positive ETCO2 and breath sounds checked- equal and bilateral Secured at: 22 cm Tube secured with: Tape Dental Injury: Teeth and Oropharynx as per pre-operative assessment

## 2022-10-20 NOTE — Progress Notes (Signed)
D/C order noted. Contacted Berenice Primas to advise clinic of pt's d/c today and that pt should resume care on Thursday.   Olivia Canter Renal Navigator [phone]

## 2022-10-24 ENCOUNTER — Encounter (HOSPITAL_COMMUNITY): Payer: Self-pay | Admitting: Gastroenterology

## 2022-10-27 ENCOUNTER — Encounter: Payer: Self-pay | Admitting: Gastroenterology

## 2022-11-03 DIAGNOSIS — Z9289 Personal history of other medical treatment: Secondary | ICD-10-CM

## 2022-11-03 HISTORY — DX: Personal history of other medical treatment: Z92.89

## 2022-11-04 ENCOUNTER — Ambulatory Visit: Payer: Medicare HMO | Admitting: Hematology and Oncology

## 2022-11-04 ENCOUNTER — Inpatient Hospital Stay: Payer: Medicare HMO | Attending: Hematology and Oncology

## 2022-11-04 ENCOUNTER — Inpatient Hospital Stay: Payer: Medicare HMO | Admitting: Hematology and Oncology

## 2022-11-04 ENCOUNTER — Other Ambulatory Visit: Payer: Self-pay

## 2022-11-04 VITALS — BP 165/73 | HR 82 | Temp 98.0°F | Resp 15 | Wt 117.8 lb

## 2022-11-04 DIAGNOSIS — R768 Other specified abnormal immunological findings in serum: Secondary | ICD-10-CM | POA: Diagnosis not present

## 2022-11-04 DIAGNOSIS — N185 Chronic kidney disease, stage 5: Secondary | ICD-10-CM

## 2022-11-04 DIAGNOSIS — E1122 Type 2 diabetes mellitus with diabetic chronic kidney disease: Secondary | ICD-10-CM

## 2022-11-04 DIAGNOSIS — F17210 Nicotine dependence, cigarettes, uncomplicated: Secondary | ICD-10-CM | POA: Diagnosis not present

## 2022-11-04 DIAGNOSIS — I120 Hypertensive chronic kidney disease with stage 5 chronic kidney disease or end stage renal disease: Secondary | ICD-10-CM | POA: Diagnosis not present

## 2022-11-04 DIAGNOSIS — Z992 Dependence on renal dialysis: Secondary | ICD-10-CM

## 2022-11-04 LAB — CBC WITH DIFFERENTIAL (CANCER CENTER ONLY)
Abs Immature Granulocytes: 0.01 10*3/uL (ref 0.00–0.07)
Basophils Absolute: 0 10*3/uL (ref 0.0–0.1)
Basophils Relative: 0 %
Eosinophils Absolute: 0.1 10*3/uL (ref 0.0–0.5)
Eosinophils Relative: 1 %
HCT: 31.6 % — ABNORMAL LOW (ref 36.0–46.0)
Hemoglobin: 9.9 g/dL — ABNORMAL LOW (ref 12.0–15.0)
Immature Granulocytes: 0 %
Lymphocytes Relative: 25 %
Lymphs Abs: 2.1 10*3/uL (ref 0.7–4.0)
MCH: 29.7 pg (ref 26.0–34.0)
MCHC: 31.3 g/dL (ref 30.0–36.0)
MCV: 94.9 fL (ref 80.0–100.0)
Monocytes Absolute: 0.5 10*3/uL (ref 0.1–1.0)
Monocytes Relative: 6 %
Neutro Abs: 5.7 10*3/uL (ref 1.7–7.7)
Neutrophils Relative %: 68 %
Platelet Count: 286 10*3/uL (ref 150–400)
RBC: 3.33 MIL/uL — ABNORMAL LOW (ref 3.87–5.11)
RDW: 14.3 % (ref 11.5–15.5)
WBC Count: 8.3 10*3/uL (ref 4.0–10.5)
nRBC: 0 % (ref 0.0–0.2)

## 2022-11-04 LAB — CMP (CANCER CENTER ONLY)
ALT: 5 U/L (ref 0–44)
AST: 11 U/L — ABNORMAL LOW (ref 15–41)
Albumin: 4.4 g/dL (ref 3.5–5.0)
Alkaline Phosphatase: 893 U/L — ABNORMAL HIGH (ref 38–126)
Anion gap: 13 (ref 5–15)
BUN: 33 mg/dL — ABNORMAL HIGH (ref 8–23)
CO2: 27 mmol/L (ref 22–32)
Calcium: 11.3 mg/dL — ABNORMAL HIGH (ref 8.9–10.3)
Chloride: 101 mmol/L (ref 98–111)
Creatinine: 4.79 mg/dL — ABNORMAL HIGH (ref 0.44–1.00)
GFR, Estimated: 9 mL/min — ABNORMAL LOW (ref >60)
Glucose, Bld: 95 mg/dL (ref 70–99)
Potassium: 4.3 mmol/L (ref 3.5–5.1)
Sodium: 141 mmol/L (ref 135–145)
Total Bilirubin: 0.3 mg/dL (ref 0.3–1.2)
Total Protein: 7.1 g/dL (ref 6.5–8.1)

## 2022-11-04 LAB — RETIC PANEL
Immature Retic Fract: 18.5 % — ABNORMAL HIGH (ref 2.3–15.9)
RBC.: 3.24 MIL/uL — ABNORMAL LOW (ref 3.87–5.11)
Retic Count, Absolute: 57.7 10*3/uL (ref 19.0–186.0)
Retic Ct Pct: 1.8 % (ref 0.4–3.1)
Reticulocyte Hemoglobin: 29.7 pg (ref >27.9)

## 2022-11-04 LAB — IRON AND IRON BINDING CAPACITY (CC-WL,HP ONLY)
Iron: 71 ug/dL (ref 28–170)
Saturation Ratios: 24 % (ref 10.4–31.8)
TIBC: 301 ug/dL (ref 250–450)
UIBC: 230 ug/dL (ref 148–442)

## 2022-11-04 LAB — LACTATE DEHYDROGENASE: LDH: 154 U/L (ref 98–192)

## 2022-11-04 LAB — FERRITIN: Ferritin: 554 ng/mL — ABNORMAL HIGH (ref 11–307)

## 2022-11-04 NOTE — Progress Notes (Signed)
[facility] [phone]   [phone]  INITIAL CONSULT NOTE  Patient Care Team: Loura Back, NP as PCP - General (Nurse Practitioner) Little Ishikawa, MD as PCP - Cardiology (Cardiology) Gerda Diss, RN as Nurse Navigator (Hematology and Oncology)  Hematological/Oncological History # Elevated Serum Free Light Chain Ratio 10/07/2022: Kappa 1191.6, Lambda 58, Ratio 20.54. No M protein on IFE 10/22/2022: Hgb 8.9, MCV 98, Plt 281, Cr 5.76 11/04/2022: establish care with Dr. Leonides Schanz   CHIEF COMPLAINTS/PURPOSE OF CONSULTATION:  "Elevated Serum Free Light Chain Ratio "  HISTORY OF PRESENTING ILLNESS:  Sara Davis 72 y.o. female with medical history significant for stage V kidney disease, depression, type 2 diabetes, hyperlipidemia, and hypertension who presents for evaluation of an elevated serum free light chain ratio in the setting of CKD.  On review of the previous records [name] was previously seen in our clinic back in November 2020.  At that time her anemia was thought to be secondary to her CKD.  As part of her recent evaluation by her nephrologist she was found to have Kappa 1191.6, Lambda 58, Ratio 20.54. No M protein on IFE from labs drawn on 10/07/2022.  Due to concern for the patient's CKD, anemia, and elevated serum free light chain ratio she was referred back to the hematology clinic.  On exam today Ms. Bean reports that she has been undergoing dialysis on Tuesdays, Thursdays, and Saturdays.  She reports that she has been on dialysis for 2 years.  She notes that she typically receives iron infusions with dialysis as well as vitamin D treatment.  She reports her energy levels are "not good".  She reports that she cannot walk very far before having to pause and take a break.  She reports that she continues to smoke 10 cigarettes/day.  She does not drink any alcohol.  She reports that in the past she used to work as a Char[employer] fundraiser.  She  otherwise denies any bone or back pain.  She reports no fevers, chills, sweats, nausea, vomiting or diarrhea.  She has had no runny nose, sore throat, cough.  A full 10 point ROS is otherwise negative.  MEDICAL HISTORY:  Past Medical History:  Diagnosis Date   Anemia    Cellulitis of scalp 02/2015   Chronic kidney disease 01/20/2021   stage V   Depression    Diabetes mellitus without complication (HCC)    History of blood transfusion    Hyperlipidemia    Hypertension     SURGICAL HISTORY: Past Surgical History:  Procedure Laterality Date   ABDOMINAL HYSTERECTOMY     AV FISTULA PLACEMENT Right 02/05/2021   Procedure: INSERTION OF RIGHT ARM ARTERIOVENOUS (AV) GORE-TEX GRAFT;  Surgeon: Leonie Douglas, MD;  Location: [facility] OR;  Service: Vascular;  Laterality: Right;   BIOPSY  01/07/2018   Procedure: BIOPSY;  Surgeon: Hilarie Fredrickson, MD;  Location: [facility] ENDOSCOPY;  Service: Endoscopy;;  Clotest   COLONOSCOPY WITH PROPOFOL N/A 01/07/2018   Procedure: COLONOSCOPY WITH PROPOFOL;  Surgeon: Hilarie Fredrickson, MD;  Location: [facility] ENDOSCOPY;  Service: Endoscopy;  Laterality: N/A;   ENTEROSCOPY N/A 06/17/2022   Procedure: ENTEROSCOPY;  Surgeon: Sherrilyn Rist, MD;  Location: [facility] ENDOSCOPY;  Service: Gastroenterology;  Laterality: N/A;   ENTEROSCOPY N/A 10/20/2022   Procedure: ENTEROSCOPY;  Surgeon: Jenel Lucks, MD;  Location: [facility] ENDOSCOPY;  Service: Gastroenterology;  Laterality: N/A;   ESOPHAGOGASTRODUODENOSCOPY (EGD) WITH PROPOFOL N/A 01/07/2018   Procedure: ESOPHAGOGASTRODUODENOSCOPY (EGD)  WITH PROPOFOL;  Surgeon: Hilarie Fredrickson, MD;  Location: [facility] ENDOSCOPY;  Service: Endoscopy;  Laterality: N/A;   ESOPHAGOGASTRODUODENOSCOPY (EGD) WITH PROPOFOL N/A 12/14/2018   Procedure: ESOPHAGOGASTRODUODENOSCOPY (EGD) WITH PROPOFOL;  Surgeon: Hilarie Fredrickson, MD;  Location: [facility] ENDOSCOPY;  Service: Endoscopy;  Laterality: N/A;   GIVENS CAPSULE STUDY N/A 10/18/2022   Procedure: GIVENS CAPSULE STUDY;  Surgeon:  Napoleon Form, MD;  Location: [facility] ENDOSCOPY;  Service: Gastroenterology;  Laterality: N/A;  Per Mansouraty   HEMOSTASIS CLIP PLACEMENT  06/17/2022   Procedure: HEMOSTASIS CLIP PLACEMENT;  Surgeon: Sherrilyn Rist, MD;  Location: [facility] ENDOSCOPY;  Service: Gastroenterology;;   HEMOSTASIS CONTROL  12/14/2018   Procedure: HEMOSTASIS CONTROL;  Surgeon: Hilarie Fredrickson, MD;  Location: [facility] ENDOSCOPY;  Service: Endoscopy;;   HOT HEMOSTASIS N/A 06/17/2022   Procedure: HOT HEMOSTASIS (ARGON PLASMA COAGULATION/BICAP);  Surgeon: Sherrilyn Rist, MD;  Location: [facility] ENDOSCOPY;  Service: Gastroenterology;  Laterality: N/A;   HOT HEMOSTASIS N/A 10/20/2022   Procedure: HOT HEMOSTASIS (ARGON PLASMA COAGULATION/BICAP);  Surgeon: Jenel Lucks, MD;  Location[facility] ENDOSCOPY;  Service: Gastroenterology;  Laterality: N/A;   POLYPECTOMY  01/07/2018   Procedure: POLYPECTOMY;  Surgeon: Hilarie Fredrickson, MD;  Location: [facility] ENDOSCOPY;  Service: Endoscopy;;   POLYPECTOMY  10/20/2022   Procedure: POLYPECTOMY;  Surgeon: Jenel Lucks, MD;  Location: [facility] ENDOSCOPY;  Service: Gastroenterology;;   Susa Day  06/17/2022   Procedure: Susa Day;  Surgeon: Sherrilyn Rist, MD;  Location: [facility] ENDOSCOPY;  Service: Gastroenterology;;    SOCIAL HISTORY: Social History   Socioeconomic History   Marital status: Single    Spouse name: Not on file   Number of children: 2   Years of education: Not on file   Highest education level: Not on file  Occupational History   Occupation: Retired  Tobacco Use   Smoking status: Every Day    Current packs/day: 0.50    Average packs/day: 0.5 packs/day for 53.0 years (26.5 ttl pk-yrs)    Types: Cigarettes   Smokeless tobacco: Never  Vaping Use   Vaping status: Never Used  Substance and Sexual Activity   Alcohol use: No    Alcohol/week: 0.0 standard drinks of alcohol   Drug use: No   Sexual activity: Not on file  Other Topics Concern   Not on file  Social History  Narrative   Not on file   Social Determinants of Health   Financial Resource Strain: Not on file  Food Insecurity: No Food Insecurity (10/17/2022)   Hunger Vital Sign    Worried About Running Out of Food in the Last Year: Never true    Ran Out of Food in the Last Year: Never true  Transportation Needs: No Transportation Needs (10/17/2022)   PRAPARE - Transportation    Lack of Transportation (Medical): No    Lack of Transportation (Non-Medical): No  Physical Activity: Not on file  Stress: Not on file  Social Connections: Not on file  Intimate Partner Violence: Not At Risk (10/17/2022)   Humiliation, Afraid, Rape, and Kick questionnaire    Fear of Current or Ex-Partner: No    Emotionally Abused: No    Physically Abused: No    Sexually Abused: No    FAMILY HISTORY: Family History  Problem Relation Age of Onset   Diabetes Mother    Cancer Mother    Diabetes Sister    Hypertension Sister    Cancer Sister 73       Bone  Diabetes Brother    Diabetes Sister    Diabetes Sister     ALLERGIES:  is allergic to aspirin.  MEDICATIONS:  Current Outpatient Medications  Medication Sig Dispense Refill   albuterol (VENTOLIN HFA) 108 (90 Base) MCG/ACT inhaler Inhale into the lungs.     diltiazem (CARDIZEM SR) 120 MG 12 hr capsule Take 1 capsule (120 mg total) by mouth 2 (two) times daily. 60 capsule 0   Methoxy PEG-Epoetin Beta (MIRCERA IJ) Mircera     atorvastatin (LIPITOR) 80 MG tablet Take 1 tablet (80 mg total) by mouth daily. 90 tablet 1   AURYXIA 1 GM 210 MG(Fe) tablet Take 420 mg by mouth 3 (three) times daily.     B Complex-C-Folic Acid (DIALYVITE TABLET) TABS Take 1 tablet by mouth daily.     capsaicin (ZOSTRIX) 0.025 % cream Apply topically 3 (three) times daily as needed. Apply a small amount to knees three times daily as needed 60 g 0   carvedilol (COREG) 25 MG tablet Take 1 tablet (25 mg total) by mouth 2 (two) times daily with a meal. 180 tablet 3   cinacalcet (SENSIPAR)  60 MG tablet Take 180 mg by mouth daily.     folic acid (FOLVITE) 1 MG tablet Take 1 tablet (1 mg total) by mouth daily. 30 tablet 1   pantoprazole (PROTONIX) 40 MG tablet Take 1 tablet (40 mg total) by mouth daily for 28 days. 28 tablet 0   No current facility-administered medications for this visit.    REVIEW OF SYSTEMS:   Constitutional: ( - ) fevers, ( - )  chills , ( - ) night sweats Eyes: ( - ) blurriness of vision, ( - ) double vision, ( - ) watery eyes Ears, nose, mouth, throat, and face: ( - ) mucositis, ( - ) sore throat Respiratory: ( - ) cough, ( - ) dyspnea, ( - ) wheezes Cardiovascular: ( - ) palpitation, ( - ) chest discomfort, ( - ) lower extremity swelling Gastrointestinal:  ( - ) nausea, ( - ) heartburn, ( - ) change in bowel habits Skin: ( - ) abnormal skin rashes Lymphatics: ( - ) new lymphadenopathy, ( - ) easy bruising Neurological: ( - ) numbness, ( - ) tingling, ( - ) new weaknesses Behavioral/Psych: ( - ) mood change, ( - ) new changes  All other systems were reviewed with the patient and are negative.  PHYSICAL EXAMINATION:  Vitals:   11/04/22 1327  BP: (!) 165/73  Pulse: 82  Resp: 15  Temp: 98 F (36.7 C)  SpO2: 100%   Filed Weights   11/04/22 1327  Weight: 117 lb 12.8 oz (53.4 kg)    GENERAL: well appearing elderly African-American female in NAD  SKIN: skin color, texture, turgor are normal, no rashes or significant lesions EYES: conjunctiva are pink and non-injected, sclera clear LUNGS: clear to auscultation and percussion with normal breathing effort HEART: regular rate & rhythm and no murmurs and no lower extremity edema Musculoskeletal: no cyanosis of digits and no clubbing  PSYCH: alert & oriented x 3, fluent speech NEURO: no focal motor/sensory deficits  LABORATORY DATA:  I have reviewed the data as listed    Latest Ref Rng & Units 11/04/2022    2:16 PM 10/20/2022   12:00 PM 10/20/2022    7:23 AM  CBC  WBC 4.0 - 10.5 K/uL 8.3  6.1     Hemoglobin 12.0 - 15.0 g/dL 9.9  8.7  8.2  Hematocrit 36.0 - 46.0 % 31.6  26.6  24.0   Platelets 150 - 400 K/uL 286  232         Latest Ref Rng & Units 11/04/2022    2:16 PM 10/20/2022    7:23 AM 10/19/2022    8:01 AM  CMP  Glucose 70 - 99 mg/dL 95  161  97   BUN 8 - 23 mg/dL 33  50  37   Creatinine 0.44 - 1.00 mg/dL 0.96  0.45  4.09   Sodium 135 - 145 mmol/L 141  138  139   Potassium 3.5 - 5.1 mmol/L 4.3  4.6  4.3   Chloride 98 - 111 mmol/L 101  106  104   CO2 22 - 32 mmol/L 27   23   Calcium 8.9 - 10.3 mg/dL 81.1   9.2   Total Protein 6.5 - 8.1 g/dL 7.1     Total Bilirubin 0.3 - 1.2 mg/dL 0.3     Alkaline Phos 38 - 126 U/L 893     AST 15 - 41 U/L 11     ALT 0 - 44 U/L 5        ASSESSMENT & PLAN Sara Davis 72 y.o. female with medical history significant for stage V kidney disease, depression, type 2 diabetes, hyperlipidemia, and hypertension who presents for evaluation of an elevated serum free light chain ratio in the setting of CKD.  After review of the labs, review of the records, and discussion with the patient the patients findings are most consistent with elevated serum free light chain ratio concerning for multiple myeloma.  Despite the lack of an M protein this may represent a free light chain myeloma.  Given this we would recommend a full workup to include metastatic survey, UPEP, and bone marrow biopsy.  In certain cases there may be ambiguity in which case a kidney biopsy would help best determine if her CKD and anemia are being caused by kidney disease versus the monoclonal dermopathy.  The patient voiced understanding of our findings and the plan moving forward.  # Elevated Serum Free Light Chain Ratio with No M protein --today will order an SPEP, UPEP, SFLC and beta 2 microglobulin --additionally will collect new baseline CBC, CMP, and LDH --recommend a metastatic bone survey to assess for lytic lesions --will need for a bone marrow biopsy due to elevated Cr and  high SFLC ratio.  --may need a biopsy of the kidney to determine if the etiology of the patient's kidney dysfunction is the elevated serum light chains.  --RTC pending results of the above studies.    Orders Placed This Encounter  Procedures   DG Bone Survey Met    Standing Status:   Future    Standing Expiration Date:   11/04/2023    Order Specific Question:   Reason for Exam (SYMPTOM  OR DIAGNOSIS REQUIRED)    Answer:   MGUS, assess for lytic lesions    Order Specific Question:   Preferred imaging location?    Answer:   [facility]   CT BONE MARROW BIOPSY & ASPIRATION    Standing Status:   Future    Standing Expiration Date:   11/04/2023    Order Specific Question:   Reason for Exam (SYMPTOM  OR DIAGNOSIS REQUIRED)    Answer:   elevated SFLC ratio, concern for multiple myeloma. Requesting bone marrow biopsy    Order Specific Question:   Preferred location?    Answer:  [facility]   CBC with Differential (Cancer Center Only)    Standing Status:   Future    Number of Occurrences:   1    Standing Expiration Date:   11/04/2023   CMP (Cancer Center only)    Standing Status:   Future    Number of Occurrences:   1    Standing Expiration Date:   11/04/2023   Lactate dehydrogenase (LDH)    Standing Status:   Future    Number of Occurrences:   1    Standing Expiration Date:   11/04/2023   Iron and Iron Binding Capacity ([facility] only)    Standing Status:   Future    Number of Occurrences:   1    Standing Expiration Date:   11/04/2023   Ferritin    Standing Status:   Future    Number of Occurrences:   1    Standing Expiration Date:   11/04/2023   Retic Panel    Standing Status:   Future    Number of Occurrences:   1    Standing Expiration Date:   11/04/2023   Multiple Myeloma Panel (SPEP&IFE w/QIG)    Standing Status:   Future    Number of Occurrences:   1    Standing Expiration Date:   11/04/2023   Kappa/lambda light chains    Standing Status:   Future    Number  of Occurrences:   1    Standing Expiration Date:   11/04/2023   Beta 2 microglobulin    Standing Status:   Future    Number of Occurrences:   1    Standing Expiration Date:   11/04/2023   24-Hr Ur UPEP/UIFE/Light Chains/TP    Standing Status:   Future    Standing Expiration Date:   11/04/2023   Erythropoietin    Standing Status:   Future    Number of Occurrences:   1    Standing Expiration Date:   11/04/2023    All questions were answered. The patient knows to call the clinic with any problems, questions or concerns.  A total of more than 60 minutes were spent on this encounter with face-to-face time and non-face-to-face time, including preparing to see the patient, ordering tests and/or medications, counseling the patient and coordination of care as outlined above.   Ulysees Barns, MD Department of Hematology/Oncology [facility] at [facility] Phone: [phone] Pager: [phone] Email: [email or web address]  11/08/2022 9:22 PM

## 2022-11-05 ENCOUNTER — Ambulatory Visit (HOSPITAL_COMMUNITY): Payer: Medicare HMO

## 2022-11-06 LAB — KAPPA/LAMBDA LIGHT CHAINS
Kappa free light chain: 896.9 mg/L — ABNORMAL HIGH (ref 3.3–19.4)
Kappa, lambda light chain ratio: 16.13 — ABNORMAL HIGH (ref 0.26–1.65)
Lambda free light chains: 55.6 mg/L — ABNORMAL HIGH (ref 5.7–26.3)

## 2022-11-06 LAB — BETA 2 MICROGLOBULIN, SERUM: Beta-2 Microglobulin: 23.4 mg/L — ABNORMAL HIGH (ref 0.6–2.4)

## 2022-11-06 LAB — ERYTHROPOIETIN: Erythropoietin: 73.4 m[IU]/mL — ABNORMAL HIGH (ref 2.6–18.5)

## 2022-11-06 NOTE — Progress Notes (Signed)
Patient office visit with Dr Leonides Schanz 11/04/22, attended visit to be introduced to patient. Will contact patient after testing to help coordinate any needs that patient may have. Will continue to monitor.

## 2022-11-08 ENCOUNTER — Encounter (HOSPITAL_COMMUNITY): Payer: Self-pay

## 2022-11-08 LAB — MULTIPLE MYELOMA PANEL, SERUM
Albumin SerPl Elph-Mcnc: 3.9 g/dL (ref 2.9–4.4)
Albumin/Glob SerPl: 1.6 (ref 0.7–1.7)
Alpha 1: 0.3 g/dL (ref 0.0–0.4)
Alpha2 Glob SerPl Elph-Mcnc: 1 g/dL (ref 0.4–1.0)
B-Globulin SerPl Elph-Mcnc: 0.9 g/dL (ref 0.7–1.3)
Gamma Glob SerPl Elph-Mcnc: 0.3 g/dL — ABNORMAL LOW (ref 0.4–1.8)
Globulin, Total: 2.5 g/dL (ref 2.2–3.9)
IgA: 57 mg/dL — ABNORMAL LOW (ref 64–422)
IgG (Immunoglobin G), Serum: 402 mg/dL — ABNORMAL LOW (ref 586–1602)
IgM (Immunoglobulin M), Srm: 14 mg/dL — ABNORMAL LOW (ref 26–217)
Total Protein ELP: 6.4 g/dL (ref 6.0–8.5)

## 2022-11-13 ENCOUNTER — Other Ambulatory Visit: Payer: Self-pay | Admitting: Radiology

## 2022-11-13 ENCOUNTER — Other Ambulatory Visit (HOSPITAL_COMMUNITY): Payer: Medicare HMO

## 2022-11-13 DIAGNOSIS — R768 Other specified abnormal immunological findings in serum: Secondary | ICD-10-CM

## 2022-11-13 NOTE — Consult Note (Signed)
Chief Complaint: Patient was seen in consultation today for CT-guided bone marrow biopsy  Referring Physician(s): Dorsey,John T IV  Supervising Physician: Richarda Overlie  Patient Status: [facility] - Out-pt  History of Present Illness: Sara Davis is a 72 y.o. female smoker with past medical history of anemia, chronic kidney disease, depression, diabetes, hyperlipidemia, hypertension who presents now with elevated serum free light chain ratio in the setting of chronic kidney disease.  She is scheduled today for CT-guided bone marrow biopsy to rule out myeloma.  Past Medical History:  Diagnosis Date   Anemia    Cellulitis of scalp 02/2015   Chronic kidney disease 01/20/2021   stage V   Depression    Diabetes mellitus without complication (HCC)    History of blood transfusion    Hyperlipidemia    Hypertension     Past Surgical History:  Procedure Laterality Date   ABDOMINAL HYSTERECTOMY     AV FISTULA PLACEMENT Right 02/05/2021   Procedure: INSERTION OF RIGHT ARM ARTERIOVENOUS (AV) GORE-TEX GRAFT;  Surgeon: Leonie Douglas, MD;  Location: [facility] OR;  Service: Vascular;  Laterality: Right;   BIOPSY  01/07/2018   Procedure: BIOPSY;  Surgeon: Hilarie Fredrickson, MD;  Location: [facility] ENDOSCOPY;  Service: Endoscopy;;  Clotest   COLONOSCOPY WITH PROPOFOL N/A 01/07/2018   Procedure: COLONOSCOPY WITH PROPOFOL;  Surgeon: Hilarie Fredrickson, MD;  Location: [facility] ENDOSCOPY;  Service: Endoscopy;  Laterality: N/A;   ENTEROSCOPY N/A 06/17/2022   Procedure: ENTEROSCOPY;  Surgeon: Sherrilyn Rist, MD;  Location: [facility] ENDOSCOPY;  Service: Gastroenterology;  Laterality: N/A;   ENTEROSCOPY N/A 10/20/2022   Procedure: ENTEROSCOPY;  Surgeon: Sara Lucks, MD;  Location: [facility] ENDOSCOPY;  Service: Gastroenterology;  Laterality: N/A;   ESOPHAGOGASTRODUODENOSCOPY (EGD) WITH PROPOFOL N/A 01/07/2018   Procedure: ESOPHAGOGASTRODUODENOSCOPY (EGD) WITH PROPOFOL;  Surgeon: Hilarie Fredrickson, MD;  Location: [facility] ENDOSCOPY;  Service:  Endoscopy;  Laterality: N/A;   ESOPHAGOGASTRODUODENOSCOPY (EGD) WITH PROPOFOL N/A 12/14/2018   Procedure: ESOPHAGOGASTRODUODENOSCOPY (EGD) WITH PROPOFOL;  Surgeon: Hilarie Fredrickson, MD;  Location: [facility] ENDOSCOPY;  Service: Endoscopy;  Laterality: N/A;   GIVENS CAPSULE STUDY N/A 10/18/2022   Procedure: GIVENS CAPSULE STUDY;  Surgeon: Napoleon Form, MD;  Location: [facility] ENDOSCOPY;  Service: Gastroenterology;  Laterality: N/A;  Per Mansouraty   HEMOSTASIS CLIP PLACEMENT  06/17/2022   Procedure: HEMOSTASIS CLIP PLACEMENT;  Surgeon: Sherrilyn Rist, MD;  Location: [facility] ENDOSCOPY;  Service: Gastroenterology;;   HEMOSTASIS CONTROL  12/14/2018   Procedure: HEMOSTASIS CONTROL;  Surgeon: Hilarie Fredrickson, MD;  Location: [facility] ENDOSCOPY;  Service: Endoscopy;;   HOT HEMOSTASIS N/A 06/17/2022   Procedure: HOT HEMOSTASIS (ARGON PLASMA COAGULATION/BICAP);  Surgeon: Sherrilyn Rist, MD;  Location: [facility] ENDOSCOPY;  Service: Gastroenterology;  Laterality: N/A;   HOT HEMOSTASIS N/A 10/20/2022   Procedure: HOT HEMOSTASIS (ARGON PLASMA COAGULATION/BICAP);  Surgeon: Sara Lucks, MD;  Location: [facility] ENDOSCOPY;  Service: Gastroenterology;  Laterality: N/A;   POLYPECTOMY  01/07/2018   Procedure: POLYPECTOMY;  Surgeon: Hilarie Fredrickson, MD;  Location: [facility] ENDOSCOPY;  Service: Endoscopy;;   POLYPECTOMY  10/20/2022   Procedure: POLYPECTOMY;  Surgeon: Sara Lucks, MD;  Location: [facility] ENDOSCOPY;  Service: Gastroenterology;;   Susa Day  06/17/2022   Procedure: Susa Day;  Surgeon: Sherrilyn Rist, MD;  Location: [facility] ENDOSCOPY;  Service: Gastroenterology;;    Allergies: Aspirin  Medications: Prior to Admission medications   Medication Sig Start Date End Date Taking? Authorizing Provider  albuterol (VENTOLIN HFA) 108 (90 Base)  MCG/ACT inhaler Inhale into the lungs. 10/27/22   [provider]  atorvastatin (LIPITOR) 80 MG tablet Take 1 tablet (80 mg total) by mouth daily. 12/20/18   Melene Plan, MD   AURYXIA 1 GM 210 MG(Fe) tablet Take 420 mg by mouth 3 (three) times daily. 04/12/22   [provider]  B Complex-C-Folic Acid (DIALYVITE TABLET) TABS Take 1 tablet by mouth daily. 03/05/22   [provider]  capsaicin (ZOSTRIX) 0.025 % cream Apply topically 3 (three) times daily as needed. Apply a small amount to knees three times daily as needed 10/20/22   Meryl Dare, MD  carvedilol (COREG) 25 MG tablet Take 1 tablet (25 mg total) by mouth 2 (two) times daily with a meal. 05/09/19   Royal Hawthorn, NP  cinacalcet (SENSIPAR) 60 MG tablet Take 180 mg by mouth daily. 04/21/22   [provider]  diltiazem (CARDIZEM SR) 120 MG 12 hr capsule Take 1 capsule (120 mg total) by mouth 2 (two) times daily. 06/17/22 11/04/22  Pokhrel, Rebekah Chesterfield, MD  folic acid (FOLVITE) 1 MG tablet Take 1 tablet (1 mg total) by mouth daily. 10/20/22   Meryl Dare, MD  Methoxy PEG-Epoetin Beta (MIRCERA IJ) Mircera 10/22/22 10/21/23  [provider]  pantoprazole (PROTONIX) 40 MG tablet Take 1 tablet (40 mg total) by mouth daily for 28 days. 06/18/22 07/16/22  Pokhrel, Rebekah Chesterfield, MD     Family History  Problem Relation Age of Onset   Diabetes Mother    Cancer Mother    Diabetes Sister    Hypertension Sister    Cancer Sister 38       Bone   Diabetes Brother    Diabetes Sister    Diabetes Sister     Social History   Socioeconomic History   Marital status: Single    Spouse name: Not on file   Number of children: 2   Years of education: Not on file   Highest education level: Not on file  Occupational History   Occupation: Retired  Tobacco Use   Smoking status: Every Day    Current packs/day: 0.50    Average packs/day: 0.5 packs/day for 53.0 years (26.5 ttl pk-yrs)    Types: Cigarettes   Smokeless tobacco: Never  Vaping Use   Vaping status: Never Used  Substance and Sexual Activity   Alcohol use: No    Alcohol/week: 0.0 standard drinks of alcohol   Drug use: No   Sexual  activity: Not on file  Other Topics Concern   Not on file  Social History Narrative   Not on file   Social Determinants of Health   Financial Resource Strain: Not on file  Food Insecurity: No Food Insecurity (10/17/2022)   Hunger Vital Sign    Worried About Running Out of Food in the Last Year: Never true    Ran Out of Food in the Last Year: Never true  Transportation Needs: No Transportation Needs (10/17/2022)   PRAPARE - Administrator, Civil Service (Medical): No    Lack of Transportation (Non-Medical): No  Physical Activity: Not on file  Stress: Not on file  Social Connections: Not on file      Review of Systems: denies fever,HA,CP,dyspnea, cough, N/V or bleeding; she does state that she "hurts all over", "in my joints"  Vital Signs:pending     Code Status: FULL CODE  Advance Care Plan: No documents on file   Physical Exam: awake/alert; chest- CAT bilat; heart- RRR; abd-soft,+BS,NT;  no LE edema; rt arm AVGG with good thrill/bruit  Imaging: US ABDOMEN LIMITED RUQ (LIVER/GB)  Result Date: 10/17/2022 CLINICAL DATA:  Elevated alkaline phosphatase. EXAM: ULTRASOUND ABDOMEN LIMITED RIGHT UPPER QUADRANT COMPARISON:  CT abdomen and pelvis, 08/08/2021. FINDINGS: Gallbladder: 2 cm stone in gallbladder fundus. No gallbladder wall thickening or pericholecystic fluid. Patient is tender to transducer pressure over the gallbladder. Common bile duct: Diameter: 5 mm Liver: No focal lesion identified. Within normal limits in parenchymal echogenicity. Portal vein is patent on color Doppler imaging with normal direction of blood flow towards the liver. Other: None. IMPRESSION: 1. No acute findings. 2. Cholelithiasis but no sonographic evidence acute cholecystitis. 3. No bile duct dilation. Electronically Signed   By: Amie Portland M.D.   On: 10/17/2022 16:02   DG Chest 2 View  Result Date: 10/17/2022 CLINICAL DATA:  Shortness of breath. Patient presented to the emergency  department from dialysis after she was told her hemoglobin was too low. EXAM: CHEST - 2 VIEW COMPARISON:  06/15/2022 FINDINGS: Heart size is normal. Low lung volumes. Chronic coarsened interstitial markings. No pleural fluid, airspace consolidation or frank interstitial edema. No bone abnormalities. IMPRESSION: 1. No acute findings. 2. Chronic interstitial lung disease. Electronically Signed   By: Signa Kell M.D.   On: 10/17/2022 08:52    Labs:  CBC: Recent Labs    10/18/22 0652 10/19/22 0801 10/20/22 0723 10/20/22 1200 11/04/22 1416  WBC 5.6 5.8  --  6.1 8.3  HGB 7.9* 8.1* 8.2* 8.7* 9.9*  HCT 24.9* 25.4* 24.0* 26.6* 31.6*  PLT 229 215  --  232 286    COAGS: Recent Labs    06/15/22 2320  INR 1.1    BMP: Recent Labs    10/17/22 0810 10/18/22 0652 10/19/22 0801 10/20/22 0723 11/04/22 1416  NA 137 135 139 138 141  K 3.5 3.8 4.3 4.6 4.3  CL 98 101 104 106 101  CO2 29 25 23   --  27  GLUCOSE 139* 94 97 123* 95  BUN 8 20 37* 50* 33*  CALCIUM 9.2 8.9 9.2  --  11.3*  CREATININE 2.68* 4.42* 5.73* 7.80* 4.79*  GFRNONAA 18* 10* 7*  --  9*    LIVER FUNCTION TESTS: Recent Labs    06/15/22 2320 10/17/22 0810 10/18/22 0652 10/19/22 0801 11/04/22 1416  BILITOT 0.5 0.4  --   --  0.3  AST 11* 14*  --   --  11*  ALT 8 10  --   --  5  ALKPHOS 357* 812*  --   --  893*  PROT 5.6* 6.1*  --   --  7.1  ALBUMIN 3.1* 3.4* 3.0* 2.8* 4.4    TUMOR MARKERS: No results for input(s): "AFPTM", "CEA", "CA199", "CHROMGRNA" in the last 8760 hours.  Assessment and Plan: 72 y.o. female smoker with past medical history of anemia, chronic kidney disease, depression, diabetes, hyperlipidemia, hypertension who presents now with elevated serum free light chain ratio in the setting of chronic kidney disease.  She is scheduled today for CT-guided bone marrow biopsy to rule out myeloma.Risks and benefits of procedure was discussed with the patient  including, but not limited to bleeding,  infection, damage to adjacent structures or low yield requiring additional tests.  All of the questions were answered and there is agreement to proceed.  Consent signed and in chart.    Thank you for this interesting consult.  I greatly enjoyed meeting Sara Davis and look forward to participating in  their care.  A copy of this report was sent to the requesting provider on this date.  Electronically Signed: D. Jeananne Rama, PA-C 11/13/2022, 5:35 PM   I spent a total of  20 minutes   in face to face in clinical consultation, greater than 50% of which was counseling/coordinating care for CT-guided bone marrow biopsy

## 2022-11-16 ENCOUNTER — Encounter (HOSPITAL_COMMUNITY): Payer: Self-pay

## 2022-11-16 ENCOUNTER — Ambulatory Visit (HOSPITAL_COMMUNITY)
Admission: RE | Admit: 2022-11-16 | Discharge: 2022-11-16 | Disposition: A | Discharge status: Home / Self Care | Payer: Medicare HMO | Source: Ambulatory Visit | Attending: Hematology and Oncology

## 2022-11-16 ENCOUNTER — Other Ambulatory Visit: Payer: Self-pay

## 2022-11-16 ENCOUNTER — Ambulatory Visit (HOSPITAL_COMMUNITY)
Admission: RE | Admit: 2022-11-16 | Discharge: 2022-11-16 | Disposition: A | Discharge status: Home / Self Care | Payer: Medicare HMO | Source: Ambulatory Visit | Attending: Hematology and Oncology | Admitting: Hematology and Oncology

## 2022-11-16 DIAGNOSIS — R768 Other specified abnormal immunological findings in serum: Secondary | ICD-10-CM | POA: Insufficient documentation

## 2022-11-16 DIAGNOSIS — I120 Hypertensive chronic kidney disease with stage 5 chronic kidney disease or end stage renal disease: Secondary | ICD-10-CM | POA: Diagnosis not present

## 2022-11-16 DIAGNOSIS — C9000 Multiple myeloma not having achieved remission: Secondary | ICD-10-CM | POA: Diagnosis not present

## 2022-11-16 DIAGNOSIS — N186 End stage renal disease: Secondary | ICD-10-CM | POA: Insufficient documentation

## 2022-11-16 DIAGNOSIS — E1122 Type 2 diabetes mellitus with diabetic chronic kidney disease: Secondary | ICD-10-CM | POA: Diagnosis not present

## 2022-11-16 LAB — CBC WITH DIFFERENTIAL/PLATELET
Abs Immature Granulocytes: 0.02 10*3/uL (ref 0.00–0.07)
Basophils Absolute: 0 10*3/uL (ref 0.0–0.1)
Basophils Relative: 0 %
Eosinophils Absolute: 0.1 10*3/uL (ref 0.0–0.5)
Eosinophils Relative: 2 %
HCT: 35.7 % — ABNORMAL LOW (ref 36.0–46.0)
Hemoglobin: 10.7 g/dL — ABNORMAL LOW (ref 12.0–15.0)
Immature Granulocytes: 0 %
Lymphocytes Relative: 27 %
Lymphs Abs: 1.8 10*3/uL (ref 0.7–4.0)
MCH: 29.6 pg (ref 26.0–34.0)
MCHC: 30 g/dL (ref 30.0–36.0)
MCV: 98.6 fL (ref 80.0–100.0)
Monocytes Absolute: 0.4 10*3/uL (ref 0.1–1.0)
Monocytes Relative: 6 %
Neutro Abs: 4.4 10*3/uL (ref 1.7–7.7)
Neutrophils Relative %: 65 %
Platelets: 248 10*3/uL (ref 150–400)
RBC: 3.62 MIL/uL — ABNORMAL LOW (ref 3.87–5.11)
RDW: 14.2 % (ref 11.5–15.5)
WBC: 6.8 10*3/uL (ref 4.0–10.5)
nRBC: 0 % (ref 0.0–0.2)

## 2022-11-16 LAB — GLUCOSE, CAPILLARY: Glucose-Capillary: 55 mg/dL — ABNORMAL LOW (ref 70–99)

## 2022-11-16 MED ORDER — MIDAZOLAM HCL 2 MG/2ML IJ SOLN
INTRAMUSCULAR | Status: AC | PRN
Start: 2022-11-16 — End: 2022-11-16
  Administered 2022-11-16 (×3): .5 mg via INTRAVENOUS

## 2022-11-16 MED ORDER — CARVEDILOL 25 MG PO TABS
25.0000 mg | ORAL_TABLET | Freq: Once | ORAL | Status: DC
  Filled 2022-11-16: qty 1

## 2022-11-16 MED ORDER — MIDAZOLAM HCL 2 MG/2ML IJ SOLN
INTRAMUSCULAR | Status: AC
  Filled 2022-11-16: qty 2

## 2022-11-16 MED ORDER — FENTANYL CITRATE (PF) 100 MCG/2ML IJ SOLN
INTRAMUSCULAR | Status: AC
  Filled 2022-11-16: qty 2

## 2022-11-16 MED ORDER — SODIUM CHLORIDE 0.9 % IV SOLN: INTRAVENOUS | Status: DC

## 2022-11-16 MED ORDER — FENTANYL CITRATE (PF) 100 MCG/2ML IJ SOLN
INTRAMUSCULAR | Status: AC | PRN
Start: 2022-11-16 — End: 2022-11-16
  Administered 2022-11-16 (×3): 25 ug via INTRAVENOUS

## 2022-11-16 MED ORDER — CARVEDILOL 12.5 MG PO TABS
ORAL_TABLET | ORAL | Status: AC
  Filled 2022-11-16: qty 2

## 2022-11-16 NOTE — Procedures (Signed)
Interventional Radiology Procedure:   Indications: Elevated serum free light chain   Procedure: CT guided bone marrow biopsy  Findings: 2 aspirates and 1 core from right ilium  Complications: None     EBL: Minimal, less than 10 ml  Plan: Discharge to home in one hour.   Loleta Frommelt R. Lowella Dandy, MD  Pager: [phone]

## 2022-11-16 NOTE — Discharge Instructions (Signed)
Discharge Instructions:   Please call Interventional Radiology clinic [phone] with any questions or concerns.  You may remove your dressing and shower tomorrow.  Bone Marrow Aspiration and Bone Marrow Biopsy, Adult, Care After This sheet gives you information about how to care for yourself after your procedure. Your health care provider may also give you more specific instructions. If you have problems or questions, contact your health care provider. What can I expect after the procedure? After the procedure, it is common to have: Mild pain and tenderness. Swelling. Bruising. Follow these instructions at home: Puncture site care  Follow instructions from your health care provider about how to take care of the puncture site. Make sure you: Wash your hands with soap and water before and after you change your bandage (dressing). If soap and water are not available, use hand sanitizer. Change your dressing as told by your health care provider. Check your puncture site every day for signs of infection. Check for: More redness, swelling, or pain. Fluid or blood. Warmth. Pus or a bad smell. Activity Return to your normal activities as told by your health care provider. Ask your health care provider what activities are safe for you. Do not lift anything that is heavier than 10 lb (4.5 kg), or the limit that you are told, until your health care provider says that it is safe. Do not drive for 24 hours if you were given a sedative during your procedure. General instructions  Take over-the-counter and prescription medicines only as told by your health care provider. Do not take baths, swim, or use a hot tub until your health care provider approves. Ask your health care provider if you may take showers. You may only be allowed to take sponge baths. If directed, put ice on the affected area. To do this: Put ice in a plastic bag. Place a towel between your skin and the bag. Leave the ice on  for 20 minutes, 2-3 times a day. Keep all follow-up visits as told by your health care provider. This is important. Contact a health care provider if: Your pain is not controlled with medicine. You have a fever. You have more redness, swelling, or pain around the puncture site. You have fluid or blood coming from the puncture site. Your puncture site feels warm to the touch. You have pus or a bad smell coming from the puncture site. Summary After the procedure, it is common to have mild pain, tenderness, swelling, and bruising. Follow instructions from your health care provider about how to take care of the puncture site and what activities are safe for you. Take over-the-counter and prescription medicines only as told by your health care provider. Contact a health care provider if you have any signs of infection, such as fluid or blood coming from the puncture site. This information is not intended to replace advice given to you by your health care provider. Make sure you discuss any questions you have with your health care provider. Document Revised: 06/07/2018 Document Reviewed: 06/07/2018 Elsevier Patient Education  2023 [employer]    Moderate Conscious Sedation, Adult, Care After This sheet gives you information about how to care for yourself after your procedure. Your health care provider may also give you more specific instructions. If you have problems or questions, contact your health care provider. What can I expect after the procedure? After the procedure, it is common to have: Sleepiness for several hours. Impaired judgment for several hours. Difficulty with balance. Vomiting if you  eat too soon. Follow these instructions at home: For the time period you were told by your health care provider:   Rest. Do not participate in activities where you could fall or become injured. Do not drive or use machinery. Do not drink alcohol. Do not take sleeping pills or medicines  that cause drowsiness. Do not make important decisions or sign legal documents. Do not take care of children on your own. Eating and drinking  Follow the diet recommended by your health care provider. Drink enough fluid to keep your urine pale yellow. If you vomit: Drink water, juice, or soup when you can drink without vomiting. Make sure you have little or no nausea before eating solid foods. General instructions Take over-the-counter and prescription medicines only as told by your health care provider. Have a responsible adult stay with you for the time you are told. It is important to have someone help care for you until you are awake and alert. Do not smoke. Keep all follow-up visits as told by your health care provider. This is important. Contact a health care provider if: You are still sleepy or having trouble with balance after 24 hours. You feel light-headed. You keep feeling nauseous or you keep vomiting. You develop a rash. You have a fever. You have redness or swelling around the IV site. Get help right away if: You have trouble breathing. You have new-onset confusion at home. Summary After the procedure, it is common to feel sleepy, have impaired judgment, or feel nauseous if you eat too soon. Rest after you get home. Know the things you should not do after the procedure. Follow the diet recommended by your health care provider and drink enough fluid to keep your urine pale yellow. Get help right away if you have trouble breathing or new-onset confusion at home. This information is not intended to replace advice given to you by your health care provider. Make sure you discuss any questions you have with your health care provider. Document Revised: 05/19/2019 Document Reviewed: 12/15/2018 Elsevier Patient Education  2023 [employer].

## 2022-11-18 ENCOUNTER — Ambulatory Visit (HOSPITAL_COMMUNITY)
Admission: RE | Admit: 2022-11-18 | Discharge: 2022-11-18 | Disposition: A | Discharge status: Home / Self Care | Payer: Medicare HMO | Source: Ambulatory Visit | Attending: Hematology and Oncology | Admitting: Hematology and Oncology

## 2022-11-18 DIAGNOSIS — R768 Other specified abnormal immunological findings in serum: Secondary | ICD-10-CM | POA: Diagnosis present

## 2022-11-18 LAB — SURGICAL PATHOLOGY

## 2022-11-25 ENCOUNTER — Encounter (HOSPITAL_COMMUNITY): Payer: Self-pay

## 2022-11-30 ENCOUNTER — Telehealth: Payer: Self-pay | Admitting: Hematology and Oncology

## 2022-12-01 ENCOUNTER — Encounter (HOSPITAL_COMMUNITY): Payer: Self-pay

## 2022-12-03 ENCOUNTER — Other Ambulatory Visit: Payer: Self-pay | Admitting: Surgery

## 2022-12-03 DIAGNOSIS — D351 Benign neoplasm of parathyroid gland: Secondary | ICD-10-CM

## 2022-12-08 ENCOUNTER — Other Ambulatory Visit: Payer: Self-pay

## 2022-12-08 ENCOUNTER — Other Ambulatory Visit: Payer: Self-pay | Admitting: Hematology and Oncology

## 2022-12-08 ENCOUNTER — Inpatient Hospital Stay: Payer: Medicare HMO

## 2022-12-08 ENCOUNTER — Inpatient Hospital Stay: Payer: Medicare HMO | Attending: Hematology and Oncology | Admitting: Hematology and Oncology

## 2022-12-08 ENCOUNTER — Encounter (HOSPITAL_COMMUNITY): Payer: Self-pay

## 2022-12-08 VITALS — BP 125/47 | HR 71 | Temp 98.3°F | Resp 14 | Wt 118.3 lb

## 2022-12-08 DIAGNOSIS — R768 Other specified abnormal immunological findings in serum: Secondary | ICD-10-CM

## 2022-12-08 DIAGNOSIS — C8331 Diffuse large B-cell lymphoma, lymph nodes of head, face, and neck: Secondary | ICD-10-CM

## 2022-12-08 DIAGNOSIS — D472 Monoclonal gammopathy: Secondary | ICD-10-CM | POA: Insufficient documentation

## 2022-12-08 LAB — CMP (CANCER CENTER ONLY)
ALT: 6 U/L (ref 0–44)
AST: 11 U/L — ABNORMAL LOW (ref 15–41)
Albumin: 4 g/dL (ref 3.5–5.0)
Alkaline Phosphatase: 755 U/L — ABNORMAL HIGH (ref 38–126)
Anion gap: 11 (ref 5–15)
BUN: 8 mg/dL (ref 8–23)
CO2: 33 mmol/L — ABNORMAL HIGH (ref 22–32)
Calcium: 10.3 mg/dL (ref 8.9–10.3)
Chloride: 96 mmol/L — ABNORMAL LOW (ref 98–111)
Creatinine: 2.48 mg/dL — ABNORMAL HIGH (ref 0.44–1.00)
GFR, Estimated: 20 mL/min — ABNORMAL LOW (ref >60)
Glucose, Bld: 114 mg/dL — ABNORMAL HIGH (ref 70–99)
Potassium: 2.9 mmol/L — ABNORMAL LOW (ref 3.5–5.1)
Sodium: 140 mmol/L (ref 135–145)
Total Bilirubin: 0.3 mg/dL (ref <1.2)
Total Protein: 6.5 g/dL (ref 6.5–8.1)

## 2022-12-08 LAB — CBC WITH DIFFERENTIAL (CANCER CENTER ONLY)
Abs Immature Granulocytes: 0.02 10*3/uL (ref 0.00–0.07)
Basophils Absolute: 0 10*3/uL (ref 0.0–0.1)
Basophils Relative: 0 %
Eosinophils Absolute: 0.1 10*3/uL (ref 0.0–0.5)
Eosinophils Relative: 1 %
HCT: 23.9 % — ABNORMAL LOW (ref 36.0–46.0)
Hemoglobin: 7.6 g/dL — ABNORMAL LOW (ref 12.0–15.0)
Immature Granulocytes: 0 %
Lymphocytes Relative: 32 %
Lymphs Abs: 2.1 10*3/uL (ref 0.7–4.0)
MCH: 29.5 pg (ref 26.0–34.0)
MCHC: 31.8 g/dL (ref 30.0–36.0)
MCV: 92.6 fL (ref 80.0–100.0)
Monocytes Absolute: 0.4 10*3/uL (ref 0.1–1.0)
Monocytes Relative: 6 %
Neutro Abs: 3.9 10*3/uL (ref 1.7–7.7)
Neutrophils Relative %: 61 %
Platelet Count: 259 10*3/uL (ref 150–400)
RBC: 2.58 MIL/uL — ABNORMAL LOW (ref 3.87–5.11)
RDW: 13.6 % (ref 11.5–15.5)
WBC Count: 6.5 10*3/uL (ref 4.0–10.5)
nRBC: 0 % (ref 0.0–0.2)

## 2022-12-08 LAB — LACTATE DEHYDROGENASE: LDH: 166 U/L (ref 98–192)

## 2022-12-08 NOTE — Progress Notes (Signed)
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 No M protein on IFE 10/22/2022: Hgb 8.9, MCV 98, Plt 281, Cr 5.76 11/04/2022: establish care with Dr. Leonides Schanz   Interval History:  Sara Davis 72 y.o. female with medical history significant for renal dysfunction, anemia, and smoldering multiple myeloma who presents for a follow up visit. The patient's last visit was on 11/04/2022 at which time she established care. In the interim since the last visit she is undergone a bone survey, bone marrow biopsy, and extensive blood work.  On exam today Sara Davis is accompanied by her son.  She reports that the bone marrow biopsy procedure went well and she "did not feel".  She notes that she is not having any bleeding, bruising, or soreness.  She reports that her bones still feel stiff.  She was recently diagnosed with a parathyroid adenoma.  She notes that she has not been having any issues with overt signs of bleeding, bruising, or dark stools.  Her stools can be more dark brown as a result of her iron therapy.  She otherwise denies any lightheadedness, dizziness, shortness of breath.  The bulk of our discussion focused on the results of her labs.  At this time it is unclear whether not the patient has full multiple myeloma or smoldering myeloma.  The bone marrow biopsy showed 30% plasma cells, most consistent with smoldering myeloma, however in the presence of endorgan damage this would represent full-blown multiple myeloma.  At this time is unclear if her renal dysfunction is being caused by the myeloma, or if the renal dysfunction  is the cause of her hypercalcemia from secondary hyperparathyroidism and renal dysfunction causing her anemia but the smoldering myeloma being indolent.  We will discuss her care with nephrology in order to get to the root cause of her findings.  MEDICAL HISTORY:  Past Medical History:  Diagnosis Date   Anemia    Cellulitis of scalp 02/2015   Chronic kidney disease 01/20/2021   stage V   Depression    Diabetes mellitus without complication (HCC)    History of blood transfusion    Hyperlipidemia    Hypertension     SURGICAL HISTORY: Past Surgical History:  Procedure Laterality Date   ABDOMINAL HYSTERECTOMY     AV FISTULA PLACEMENT Right 02/05/2021   Procedure: INSERTION OF RIGHT ARM ARTERIOVENOUS (AV) GORE-TEX GRAFT;  Surgeon: Leonie Douglas, MD;  Location: [facility] OR;  Service: Vascular;  Laterality: Right;   BIOPSY  01/07/2018   Procedure: BIOPSY;  Surgeon: Hilarie Fredrickson, MD;  Location: [facility] ENDOSCOPY;  Service: Endoscopy;;  Clotest   COLONOSCOPY WITH PROPOFOL N/A 01/07/2018   Procedure: COLONOSCOPY WITH PROPOFOL;  Surgeon: Hilarie Fredrickson, MD;  Location: [facility] ENDOSCOPY;  Service: Endoscopy;  Laterality: N/A;   ENTEROSCOPY N/A 06/17/2022   Procedure: ENTEROSCOPY;  Surgeon: Sherrilyn Rist, MD;  Location: [facility] ENDOSCOPY;  Service: Gastroenterology;  Laterality: N/A;   ENTEROSCOPY N/A 10/20/2022   Procedure: ENTEROSCOPY;  Surgeon: Jenel Lucks, MD;  Location: [facility] ENDOSCOPY;  Service: Gastroenterology;  Laterality: N/A;   ESOPHAGOGASTRODUODENOSCOPY (EGD) WITH PROPOFOL N/A 01/07/2018   Procedure: ESOPHAGOGASTRODUODENOSCOPY (EGD) WITH PROPOFOL;  Surgeon: Marina Goodell,  Wilhemina Bonito, MD;  Location: [facility] ENDOSCOPY;  Service: Endoscopy;  Laterality: N/A;   ESOPHAGOGASTRODUODENOSCOPY (EGD) WITH PROPOFOL N/A 12/14/2018   Procedure: ESOPHAGOGASTRODUODENOSCOPY (EGD) WITH PROPOFOL;  Surgeon: Hilarie Fredrickson, MD;  Location: [facility] ENDOSCOPY;  Service: Endoscopy;  Laterality: N/A;   GIVENS CAPSULE STUDY N/A 10/18/2022    Procedure: GIVENS CAPSULE STUDY;  Surgeon: Napoleon Form, MD;  Location: [facility] ENDOSCOPY;  Service: Gastroenterology;  Laterality: N/A;  Per Mansouraty   HEMOSTASIS CLIP PLACEMENT  06/17/2022   Procedure: HEMOSTASIS CLIP PLACEMENT;  Surgeon: Sherrilyn Rist, MD;  Location: [facility] ENDOSCOPY;  Service: Gastroenterology;;   HEMOSTASIS CONTROL  12/14/2018   Procedure: HEMOSTASIS CONTROL;  Surgeon: Hilarie Fredrickson, MD;  Location: [facility] ENDOSCOPY;  Service: Endoscopy;;   HOT HEMOSTASIS N/A 06/17/2022   Procedure: HOT HEMOSTASIS (ARGON PLASMA COAGULATION/BICAP);  Surgeon: Sherrilyn Rist, MD;  Location: [facility] ENDOSCOPY;  Service: Gastroenterology;  Laterality: N/A;   HOT HEMOSTASIS N/A 10/20/2022   Procedure: HOT HEMOSTASIS (ARGON PLASMA COAGULATION/BICAP);  Surgeon: Jenel Lucks, MD;  Location: [facility] ENDOSCOPY;  Service: Gastroenterology;  Laterality: N/A;   POLYPECTOMY  01/07/2018   Procedure: POLYPECTOMY;  Surgeon: Hilarie Fredrickson, MD;  Location: [facility] ENDOSCOPY;  Service: Endoscopy;;   POLYPECTOMY  10/20/2022   Procedure: POLYPECTOMY;  Surgeon: Jenel Lucks, MD;  Location: [facility] ENDOSCOPY;  Service: Gastroenterology;;   Susa Day  06/17/2022   Procedure: Susa Day;  Surgeon: Sherrilyn Rist, MD;  Location: [facility] ENDOSCOPY;  Service: Gastroenterology;;    SOCIAL HISTORY: Social History   Socioeconomic History   Marital status: Single    Spouse name: Not on file   Number of children: 2   Years of education: Not on file   Highest education level: Not on file  Occupational History   Occupation: Retired  Tobacco Use   Smoking status: Every Day    Current packs/day: 0.50    Average packs/day: 0.5 packs/day for 53.0 years (26.5 ttl pk-yrs)    Types: Cigarettes   Smokeless tobacco: Never  Vaping Use   Vaping status: Never Used  Substance and Sexual Activity   Alcohol use: No    Alcohol/week: 0.0 standard drinks of alcohol   Drug use: No   Sexual activity: Not on file  Other Topics  Concern   Not on file  Social History Narrative   Not on file   Social Determinants of Health   Financial Resource Strain: Not on file  Food Insecurity: No Food Insecurity (10/17/2022)   Hunger Vital Sign    Worried About Running Out of Food in the Last Year: Never true    Ran Out of Food in the Last Year: Never true  Transportation Needs: No Transportation Needs (10/17/2022)   PRAPARE - Transportation    Lack of Transportation (Medical): No    Lack of Transportation (Non-Medical): No  Physical Activity: Not on file  Stress: Not on file  Social Connections: Not on file  Intimate Partner Violence: Not At Risk (10/17/2022)   Humiliation, Afraid, Rape, and Kick questionnaire    Fear of Current or Ex-Partner: No    Emotionally Abused: No    Physically Abused: No    Sexually Abused: No    FAMILY HISTORY: Family History  Problem Relation Age of Onset   Diabetes Mother    Cancer Mother    Diabetes Sister    Hypertension Sister    Cancer Sister 78       Bone   Diabetes Brother  Diabetes Sister    Diabetes Sister     ALLERGIES:  is allergic to aspirin.  MEDICATIONS:  Current Outpatient Medications  Medication Sig Dispense Refill   albuterol (VENTOLIN HFA) 108 (90 Base) MCG/ACT inhaler Inhale into the lungs.     atorvastatin (LIPITOR) 80 MG tablet Take 1 tablet (80 mg total) by mouth daily. 90 tablet 1   AURYXIA 1 GM 210 MG(Fe) tablet Take 420 mg by mouth 3 (three) times daily.     B Complex-C-Folic Acid (DIALYVITE TABLET) TABS Take 1 tablet by mouth daily.     capsaicin (ZOSTRIX) 0.025 % cream Apply topically 3 (three) times daily as needed. Apply a small amount to knees three times daily as needed 60 g 0   carvedilol (COREG) 25 MG tablet Take 1 tablet (25 mg total) by mouth 2 (two) times daily with a meal. 180 tablet 3   cinacalcet (SENSIPAR) 60 MG tablet Take 180 mg by mouth daily.     diltiazem (CARDIZEM SR) 120 MG 12 hr capsule Take 1 capsule (120 mg total) by mouth 2  (two) times daily. 60 capsule 0   folic acid (FOLVITE) 1 MG tablet Take 1 tablet (1 mg total) by mouth daily. 30 tablet 1   Methoxy PEG-Epoetin Beta (MIRCERA IJ) Mircera     pantoprazole (PROTONIX) 40 MG tablet Take 1 tablet (40 mg total) by mouth daily for 28 days. 28 tablet 0   No current facility-administered medications for this visit.    REVIEW OF SYSTEMS:   Constitutional: ( - ) fevers, ( - )  chills , ( - ) night sweats Eyes: ( - ) blurriness of vision, ( - ) double vision, ( - ) watery eyes Ears, nose, mouth, throat, and face: ( - ) mucositis, ( - ) sore throat Respiratory: ( - ) cough, ( - ) dyspnea, ( - ) wheezes Cardiovascular: ( - ) palpitation, ( - ) chest discomfort, ( - ) lower extremity swelling Gastrointestinal:  ( - ) nausea, ( - ) heartburn, ( - ) change in bowel habits Skin: ( - ) abnormal skin rashes Lymphatics: ( - ) new lymphadenopathy, ( - ) easy bruising Neurological: ( - ) numbness, ( - ) tingling, ( - ) new weaknesses Behavioral/Psych: ( - ) mood change, ( - ) new changes  All other systems were reviewed with the patient and are negative.  PHYSICAL EXAMINATION: ECOG PERFORMANCE STATUS: 1 - Symptomatic but completely ambulatory  Vitals:   12/08/22 1210  BP: (!) 125/47  Pulse: 71  Resp: 14  Temp: 98.3 F (36.8 C)  SpO2: 98%   Filed Weights   12/08/22 1210  Weight: 118 lb 4.8 oz (53.7 kg)    GENERAL: Well-appearing elderly African-American female, alert, no distress and comfortable SKIN: skin color, texture, turgor are normal, no rashes or significant lesions EYES: conjunctiva are pink and non-injected, sclera clear LUNGS: clear to auscultation and percussion with normal breathing effort HEART: regular rate & rhythm and no murmurs and no lower extremity edema Musculoskeletal: no cyanosis of digits and no clubbing  PSYCH: alert & oriented x 3, fluent speech NEURO: no focal motor/sensory deficits  LABORATORY DATA:  I have reviewed the data as  listed    Latest Ref Rng & Units 12/08/2022   11:55 AM 11/16/2022   10:00 AM 11/04/2022    2:16 PM  CBC  WBC 4.0 - 10.5 K/uL 6.5  6.8  8.3   Hemoglobin 12.0 - 15.0 g/dL 7.6  10.7  9.9   Hematocrit 36.0 - 46.0 % 23.9  35.7  31.6   Platelets 150 - 400 K/uL 259  248  286        Latest Ref Rng & Units 12/08/2022   11:55 AM 11/04/2022    2:16 PM 10/20/2022    7:23 AM  CMP  Glucose 70 - 99 mg/dL 782  95  956   BUN 8 - 23 mg/dL 8  33  50   Creatinine 0.44 - 1.00 mg/dL 2.13  0.86  5.78   Sodium 135 - 145 mmol/L 140  141  138   Potassium 3.5 - 5.1 mmol/L 2.9  4.3  4.6   Chloride 98 - 111 mmol/L 96  101  106   CO2 22 - 32 mmol/L 33  27    Calcium 8.9 - 10.3 mg/dL 46.9  62.9    Total Protein 6.5 - 8.1 g/dL 6.5  7.1    Total Bilirubin <1.2 mg/dL 0.3  0.3    Alkaline Phos 38 - 126 U/L 755  893    AST 15 - 41 U/L 11  11    ALT 0 - 44 U/L 6  5      Lab Results  Component Value Date   MPROTEIN Not Observed 11/04/2022   Lab Results  Component Value Date   KPAFRELGTCHN 1,161.2 (H) 12/08/2022   KPAFRELGTCHN 896.9 (H) 11/04/2022   LAMBDASER 54.4 (H) 12/08/2022   LAMBDASER 55.6 (H) 11/04/2022   KAPLAMBRATIO 21.35 (H) 12/08/2022   KAPLAMBRATIO 16.13 (H) 11/04/2022    RADIOGRAPHIC STUDIES: DG Bone Survey Met  Result Date: 12/08/2022 CLINICAL DATA:  MGUS. EXAM: METASTATIC BONE SURVEY COMPARISON:  Chest x-ray dated October 17, 2022. FINDINGS: Multiple tiny well-defined lucencies in the calvarium. The bones are diffusely osteopenic. Vertebral body endplate sclerosis in the thoracic and lumbar spine. Small erosions of both humeral and femoral heads. Periarticular resorption involving the acromioclavicular joints. IMPRESSION: 1. 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 Correlation with recent bone marrow biopsy recommended. Electronically Signed   By: Obie Dredge M.D.   On: 12/08/2022  12:32   CT BONE MARROW BIOPSY & ASPIRATION  Result Date: 11/16/2022 INDICATION: Elevated serum immunoglobulin free light chain level. Evaluate for multiple myeloma. EXAM: CT GUIDED BONE MARROW ASPIRATES AND BIOPSY Physician: Rachelle Hora. Lowella Dandy, MD MEDICATIONS: None. ANESTHESIA/SEDATION: Moderate (conscious) sedation was employed during this procedure. A total of Versed 1.5mg  and fentanyl 75 mcg was administered intravenously at the order of the provider performing the procedure. Total intra-service moderate sedation time: 16 minutes. Patient's level of consciousness and vital signs were monitored continuously by radiology nurse throughout the procedure under the supervision of the provider performing the procedure. COMPLICATIONS: None immediate. PROCEDURE: The procedure was explained to the patient. The risks and benefits of the procedure were discussed and the patient's questions were addressed. Informed consent was obtained from the patient. The patient was placed prone on CT table. Images of the pelvis were obtained. The right side of back was prepped and draped in sterile fashion. The skin and right posterior ilium were anesthetized with 1% lidocaine. 11 gauge bone needle was directed into the right ilium with CT guidance. Two aspirates and one core biopsy were obtained. Bandage placed over the puncture site. RADIATION DOSE REDUCTION: This exam was performed according to the departmental dose-optimization program which includes automated exposure control, adjustment of the mA and/or kV according to patient size and/or use  of iterative reconstruction technique. FINDINGS: Areas of sclerosis throughout the visualized bones. This may be related to patient's chronic renal disease. Biopsy needle directed into the posterior right ilium. IMPRESSION: CT guided bone marrow aspiration and core biopsy. Electronically Signed   By: Richarda Overlie M.D.   On: 11/16/2022 16:23    ASSESSMENT & PLAN Sara Davis 72 y.o. female  with medical history significant for renal dysfunction, anemia, and smoldering multiple myeloma who presents for a follow up visit.  This patient has a very complex set of results which are difficult to interpret.  At this time we know the patient has at least smoldering multiple myeloma, however it is possible she has multiple myeloma.  The 2 possibilities are:   1) the patient has multiple myeloma which is the source of her renal dysfunction and anemia. 2) her renal dysfunction is due to a different etiology and is causing her anemia.  In this case she would have incidental an indolent smoldering myeloma.  In order to help clarify the best step at this time would be to perform a kidney biopsy.  Will reach out to nephrology to see if they have any other thoughts on how to reach the core because of her current findings.  # Elevated Serum Free Light Chain Ratio with No M protein --At this time the patient has at least smoldering multiple myeloma, further workup is required in order to determine if the patient requires treatment for multiple myeloma. - metastatic bone survey was inconclusive but most consistent with hyperparathyroidism. --Bone marrow biopsy revealed over 30% plasma cells, however in the absence of endorgan damage this may represent smoldering myeloma. --will need a biopsy of the kidney to determine if the etiology of the patient's kidney dysfunction is the elevated serum light chains.  --RTC pending discussion with nephrology.  Will order labs next week due to hemoglobin drop to 7.6 with plan for placeholder visit in 4 weeks time.  No orders of the defined types were placed in this encounter.   All questions were answered. The patient knows to call the clinic with any problems, questions or concerns.  A total of more than 30 minutes were spent on this encounter with face-to-face time and non-face-to-face time, including preparing to see the patient, ordering tests and/or  medications, counseling the patient and coordination of care as outlined above.   Ulysees Barns, MD Department of Hematology/Oncology [facility] Phone: [phone] Pager: [phone] Email: [email or web address]  12/13/2022 5:53 PM

## 2022-12-08 NOTE — Patient Instructions (Signed)
Today we reviewed the results of the bone marrow biopsy, bone survey, and lab work.  At this time it is unclear if you have smoldering multiple myeloma or full active multiple myeloma.  The bone marrow at 30% abnormal cells. Typically more than 60% would be myeloma.  The bone x-ray did not show clear evidence of myeloma.  The next best step is to obtain a biopsy of the kidney to see if there is evidence the abnormal protein from the bone marrow is causing the kidney trouble.  We will get labs next week because your Hemoglobin number dropped to 7.6. You would need a blood transfusion if the hemoglobin drops below 7.0.

## 2022-12-09 ENCOUNTER — Encounter (HOSPITAL_COMMUNITY): Payer: Self-pay

## 2022-12-09 LAB — KAPPA/LAMBDA LIGHT CHAINS
Kappa free light chain: 1161.2 mg/L — ABNORMAL HIGH (ref 3.3–19.4)
Kappa, lambda light chain ratio: 21.35 — ABNORMAL HIGH (ref 0.26–1.65)
Lambda free light chains: 54.4 mg/L — ABNORMAL HIGH (ref 5.7–26.3)

## 2022-12-09 LAB — BETA 2 MICROGLOBULIN, SERUM: Beta-2 Microglobulin: 16 mg/L — ABNORMAL HIGH (ref 0.6–2.4)

## 2022-12-09 NOTE — Telephone Encounter (Signed)
Received a referral from Dr. Leonides Schanz to get patient scheduled for a Rad Onc consult. Per Vikki Ports referral was created in error. Closing referral until further notice.

## 2022-12-13 ENCOUNTER — Encounter (HOSPITAL_COMMUNITY): Payer: Self-pay

## 2022-12-14 LAB — MULTIPLE MYELOMA PANEL, SERUM
Albumin SerPl Elph-Mcnc: 3.4 g/dL (ref 2.9–4.4)
Albumin/Glob SerPl: 1.5 (ref 0.7–1.7)
Alpha 1: 0.3 g/dL (ref 0.0–0.4)
Alpha2 Glob SerPl Elph-Mcnc: 1 g/dL (ref 0.4–1.0)
B-Globulin SerPl Elph-Mcnc: 0.7 g/dL (ref 0.7–1.3)
Gamma Glob SerPl Elph-Mcnc: 0.4 g/dL (ref 0.4–1.8)
Globulin, Total: 2.4 g/dL (ref 2.2–3.9)
IgA: 49 mg/dL — ABNORMAL LOW (ref 64–422)
IgG (Immunoglobin G), Serum: 366 mg/dL — ABNORMAL LOW (ref 586–1602)
IgM (Immunoglobulin M), Srm: 13 mg/dL — ABNORMAL LOW (ref 26–217)
Total Protein ELP: 5.8 g/dL — ABNORMAL LOW (ref 6.0–8.5)

## 2022-12-17 ENCOUNTER — Other Ambulatory Visit: Payer: Self-pay

## 2022-12-18 ENCOUNTER — Other Ambulatory Visit: Payer: Self-pay

## 2022-12-18 ENCOUNTER — Ambulatory Visit
Admission: RE | Admit: 2022-12-18 | Discharge: 2022-12-18 | Disposition: A | Discharge status: Home / Self Care | Payer: Medicare HMO | Source: Ambulatory Visit | Attending: Surgery | Admitting: Surgery

## 2022-12-18 ENCOUNTER — Other Ambulatory Visit: Payer: Self-pay | Admitting: *Deleted

## 2022-12-18 ENCOUNTER — Inpatient Hospital Stay: Payer: Medicare HMO

## 2022-12-18 DIAGNOSIS — C8331 Diffuse large B-cell lymphoma, lymph nodes of head, face, and neck: Secondary | ICD-10-CM

## 2022-12-18 DIAGNOSIS — D351 Benign neoplasm of parathyroid gland: Secondary | ICD-10-CM

## 2022-12-18 DIAGNOSIS — R768 Other specified abnormal immunological findings in serum: Secondary | ICD-10-CM | POA: Diagnosis not present

## 2022-12-18 LAB — CMP (CANCER CENTER ONLY)
ALT: 5 U/L (ref 0–44)
AST: 11 U/L — ABNORMAL LOW (ref 15–41)
Albumin: 4.1 g/dL (ref 3.5–5.0)
Alkaline Phosphatase: 758 U/L — ABNORMAL HIGH (ref 38–126)
Anion gap: 12 (ref 5–15)
BUN: 13 mg/dL (ref 8–23)
CO2: 33 mmol/L — ABNORMAL HIGH (ref 22–32)
Calcium: 9.7 mg/dL (ref 8.9–10.3)
Chloride: 95 mmol/L — ABNORMAL LOW (ref 98–111)
Creatinine: 3.98 mg/dL — ABNORMAL HIGH (ref 0.44–1.00)
GFR, Estimated: 11 mL/min — ABNORMAL LOW (ref >60)
Glucose, Bld: 203 mg/dL — ABNORMAL HIGH (ref 70–99)
Potassium: 3.6 mmol/L (ref 3.5–5.1)
Sodium: 140 mmol/L (ref 135–145)
Total Bilirubin: 0.4 mg/dL (ref <1.2)
Total Protein: 6.5 g/dL (ref 6.5–8.1)

## 2022-12-18 LAB — CBC WITH DIFFERENTIAL (CANCER CENTER ONLY)
Abs Immature Granulocytes: 0.02 10*3/uL (ref 0.00–0.07)
Basophils Absolute: 0 10*3/uL (ref 0.0–0.1)
Basophils Relative: 0 %
Eosinophils Absolute: 0.1 10*3/uL (ref 0.0–0.5)
Eosinophils Relative: 1 %
HCT: 23.7 % — ABNORMAL LOW (ref 36.0–46.0)
Hemoglobin: 7.6 g/dL — ABNORMAL LOW (ref 12.0–15.0)
Immature Granulocytes: 0 %
Lymphocytes Relative: 24 %
Lymphs Abs: 1.4 10*3/uL (ref 0.7–4.0)
MCH: 29.6 pg (ref 26.0–34.0)
MCHC: 32.1 g/dL (ref 30.0–36.0)
MCV: 92.2 fL (ref 80.0–100.0)
Monocytes Absolute: 0.4 10*3/uL (ref 0.1–1.0)
Monocytes Relative: 7 %
Neutro Abs: 4 10*3/uL (ref 1.7–7.7)
Neutrophils Relative %: 68 %
Platelet Count: 273 10*3/uL (ref 150–400)
RBC: 2.57 MIL/uL — ABNORMAL LOW (ref 3.87–5.11)
RDW: 13.5 % (ref 11.5–15.5)
WBC Count: 5.9 10*3/uL (ref 4.0–10.5)
nRBC: 0 % (ref 0.0–0.2)

## 2022-12-18 LAB — VITAMIN B12: Vitamin B-12: 255 pg/mL (ref 180–914)

## 2022-12-18 LAB — SAMPLE TO BLOOD BANK

## 2022-12-18 LAB — FOLATE: Folate: 9.2 ng/mL (ref >5.9)

## 2022-12-25 ENCOUNTER — Other Ambulatory Visit: Payer: Self-pay

## 2022-12-25 ENCOUNTER — Emergency Department (HOSPITAL_COMMUNITY)
Admission: EM | Admit: 2022-12-25 | Discharge: 2022-12-25 | Disposition: A | Discharge status: Home / Self Care | Payer: Medicare HMO | Attending: Student | Admitting: Student

## 2022-12-25 ENCOUNTER — Encounter (HOSPITAL_COMMUNITY): Payer: Self-pay

## 2022-12-25 DIAGNOSIS — N186 End stage renal disease: Secondary | ICD-10-CM | POA: Diagnosis not present

## 2022-12-25 DIAGNOSIS — Z79899 Other long term (current) drug therapy: Secondary | ICD-10-CM | POA: Insufficient documentation

## 2022-12-25 DIAGNOSIS — D631 Anemia in chronic kidney disease: Secondary | ICD-10-CM | POA: Diagnosis not present

## 2022-12-25 DIAGNOSIS — I120 Hypertensive chronic kidney disease with stage 5 chronic kidney disease or end stage renal disease: Secondary | ICD-10-CM | POA: Insufficient documentation

## 2022-12-25 DIAGNOSIS — Z992 Dependence on renal dialysis: Secondary | ICD-10-CM | POA: Diagnosis not present

## 2022-12-25 DIAGNOSIS — E876 Hypokalemia: Secondary | ICD-10-CM | POA: Diagnosis not present

## 2022-12-25 DIAGNOSIS — D649 Anemia, unspecified: Secondary | ICD-10-CM | POA: Diagnosis present

## 2022-12-25 DIAGNOSIS — E1122 Type 2 diabetes mellitus with diabetic chronic kidney disease: Secondary | ICD-10-CM | POA: Diagnosis not present

## 2022-12-25 LAB — BASIC METABOLIC PANEL
Anion gap: 16 — ABNORMAL HIGH (ref 5–15)
BUN: 16 mg/dL (ref 8–23)
CO2: 28 mmol/L (ref 22–32)
Calcium: 9.6 mg/dL (ref 8.9–10.3)
Chloride: 97 mmol/L — ABNORMAL LOW (ref 98–111)
Creatinine, Ser: 4.56 mg/dL — ABNORMAL HIGH (ref 0.44–1.00)
GFR, Estimated: 10 mL/min — ABNORMAL LOW (ref >60)
Glucose, Bld: 191 mg/dL — ABNORMAL HIGH (ref 70–99)
Potassium: 3 mmol/L — ABNORMAL LOW (ref 3.5–5.1)
Sodium: 141 mmol/L (ref 135–145)

## 2022-12-25 LAB — CBC
HCT: 23.1 % — ABNORMAL LOW (ref 36.0–46.0)
Hemoglobin: 7.2 g/dL — ABNORMAL LOW (ref 12.0–15.0)
MCH: 29.6 pg (ref 26.0–34.0)
MCHC: 31.2 g/dL (ref 30.0–36.0)
MCV: 95.1 fL (ref 80.0–100.0)
Platelets: 269 10*3/uL (ref 150–400)
RBC: 2.43 MIL/uL — ABNORMAL LOW (ref 3.87–5.11)
RDW: 14.6 % (ref 11.5–15.5)
WBC: 4.4 10*3/uL (ref 4.0–10.5)
nRBC: 0 % (ref 0.0–0.2)

## 2022-12-25 LAB — CBG MONITORING, ED: Glucose-Capillary: 137 mg/dL — ABNORMAL HIGH (ref 70–99)

## 2022-12-25 LAB — TYPE AND SCREEN
ABO/RH(D): O POS
Antibody Screen: NEGATIVE

## 2022-12-25 NOTE — ED Triage Notes (Signed)
Pt to ED via pov from home. Pt was called by dialysis RN to come to hospital for blood transfusion. Pt was told her hemoglobin was less than 7. Pt denies pain, shortness of breath, or blood in vomit or stool. Pt states they are trying to figure out where she is bleeding from. Pt states she does feel weak.

## 2022-12-25 NOTE — Discharge Instructions (Addendum)
We rechecked your blood counts today.  Your hemoglobin is above 7, which does not require transfusion at this time.  You and I had a discussion, and you have opted to hold off on transfusion today.  Please return to the ER if you develop chest pain, shortness of breath, lightheadedness, passing out.  Additionally, your potassium was slightly low today.  I would recommend eating potassium containing foods, such as bananas, avocados to increase your potassium.  At your follow-up appointment with your primary care doctor, you should discuss whether you should be started on a potassium supplement.  However, this can be higher risk in patients that have renal failure and are on dialysis.

## 2022-12-25 NOTE — ED Provider Notes (Signed)
[place] EMERGENCY DEPARTMENT AT [facility] Provider Note   CSN: [id number] Arrival date & time: 12/25/22  1010     History  Chief Complaint  Patient presents with   low hemoglobin    Sara Davis is a 72 y.o. female.  72 year old female with past medical history of ESRD on HD, chronic anemia, AVM of small bowel, HTN, HLD, T2DM presents here for concerns of low hemoglobin.  Patient had routine labs performed at dialysis.  They called her today notifying her that her hemoglobin was less than 7 and she should present here for evaluation and possible transfusion.  She has required blood transfusions in the past.  Patient denies any changes to her stools.  She notes that it has been dark for some time.  She does not make much urine.  However, denies hematuria.  No other abnormal bleeding noted.  Patient endorses 1 episode of weakness last week.  She denies any chest pain or shortness of breath.  Denies weakness, lightheadedness here.  Does report having a numbness sensation in her left lower extremity for the last week.  Denies recent trauma.  The history is provided by the patient and a caregiver.       Home Medications Prior to Admission medications   Medication Sig Start Date End Date Taking? Authorizing Provider  albuterol (VENTOLIN HFA) 108 (90 Base) MCG/ACT inhaler Inhale into the lungs. 10/27/22   [provider]  atorvastatin (LIPITOR) 80 MG tablet Take 1 tablet (80 mg total) by mouth daily. 12/20/18   Melene Plan, MD  AURYXIA 1 GM 210 MG(Fe) tablet Take 420 mg by mouth 3 (three) times daily. 04/12/22   [provider]  B Complex-C-Folic Acid (DIALYVITE TABLET) TABS Take 1 tablet by mouth daily. 03/05/22   [provider]  capsaicin (ZOSTRIX) 0.025 % cream Apply topically 3 (three) times daily as needed. Apply a small amount to knees three times daily as needed 10/20/22   Meryl Dare, MD  carvedilol (COREG) 25 MG tablet Take 1 tablet (25  mg total) by mouth 2 (two) times daily with a meal. 05/09/19   Royal Hawthorn, NP  cinacalcet (SENSIPAR) 60 MG tablet Take 180 mg by mouth daily. 04/21/22   [provider]  diltiazem (CARDIZEM SR) 120 MG 12 hr capsule Take 1 capsule (120 mg total) by mouth 2 (two) times daily. 06/17/22 11/04/22  Pokhrel, Rebekah Chesterfield, MD  folic acid (FOLVITE) 1 MG tablet Take 1 tablet (1 mg total) by mouth daily. 10/20/22   Meryl Dare, MD  Methoxy PEG-Epoetin Beta (MIRCERA IJ) Mircera 10/22/22 10/21/23  [provider]  pantoprazole (PROTONIX) 40 MG tablet Take 1 tablet (40 mg total) by mouth daily for 28 days. 06/18/22 07/16/22  Pokhrel, Rebekah Chesterfield, MD      Allergies    Aspirin    Review of Systems   As noted in HPI  Physical Exam Updated Vital Signs BP (!) 145/64 (BP Location: Left Arm)   Pulse 75   Temp 98 F (36.7 C) (Oral)   Resp 15   Ht 5\' 6"  (1.676 m)   Wt 54.4 kg   SpO2 99%   BMI 19.37 kg/m  Physical Exam Vitals reviewed.  Constitutional:      General: She is not in acute distress.    Appearance: She is underweight. She is not ill-appearing, toxic-appearing or diaphoretic.  Eyes:     Comments: Conjunctiva pale bilaterally  Cardiovascular:     Rate and Rhythm:  Normal rate and regular rhythm.     Heart sounds: Normal heart sounds. No murmur heard.    No friction rub. No gallop.  Pulmonary:     Effort: Pulmonary effort is normal. No respiratory distress.     Breath sounds: Normal breath sounds. No wheezing, rhonchi or rales.  Abdominal:     General: There is no distension.     Palpations: Abdomen is soft.     Tenderness: There is no abdominal tenderness. There is no guarding or rebound.  Musculoskeletal:     Right lower leg: No edema.     Left lower leg: No edema.  Skin:    General: Skin is warm and dry.  Neurological:     Mental Status: She is alert.     Comments: Sensation intact transplantable bilateral upper and lower extremities.  Strength 5/5 with bilateral  shoulder flexion.  Strength 4/5 in bilateral hip flexion.     ED Results / Procedures / Treatments   Labs (all labs ordered are listed, but only abnormal results are displayed) Labs Reviewed  BASIC METABOLIC PANEL - Abnormal; Notable for the following components:      Result Value   Potassium 3.0 (*)    Chloride 97 (*)    Glucose, Bld 191 (*)    Creatinine, Ser 4.56 (*)    GFR, Estimated 10 (*)    Anion gap 16 (*)    All other components within normal limits  CBC - Abnormal; Notable for the following components:   RBC 2.43 (*)    Hemoglobin 7.2 (*)    HCT 23.1 (*)    All other components within normal limits  CBG MONITORING, ED - Abnormal; Notable for the following components:   Glucose-Capillary 137 (*)    All other components within normal limits  URINALYSIS, ROUTINE W REFLEX MICROSCOPIC  TYPE AND SCREEN    EKG None  Radiology No results found.  Procedures Procedures    Medications Ordered in ED Medications - No data to display  ED Course/ Medical Decision Making/ A&P                                 Medical Decision Making Amount and/or Complexity of Data Reviewed Labs: ordered.   72 year old female presents here for concerns of acute on chronic anemia.  Was notified by dialysis that her hemoglobin was less than 7.  Blood pressure slightly low for patient on arrival.  Otherwise, hemodynamically stable.  She has pale conjunctiva bilaterally on exam.  Records reviewed.  Last CBC noted on 11/15, which demonstrated a hemoglobin of 7.6 at that time.  Initial differential diagnosis includes acute on chronic anemia, acute blood loss anemia, GI bleed, electrolyte abnormality, acute fracture, CVA.  Will repeat blood work, including CBC, BMP, type and screen.  Will attempt to get UA to evaluate for hematuria.  However, patient makes little urine, so may not be able to achieve that in the emergency department.  I considered CVA as etiology of patient's left lower extremity  numbness.  However, her sensation and strength are intact on my exam here.  I have a low suspicion for an intracranial process as the etiology of her symptoms.  Will hold off on neuroimaging.  I considered trauma as the etiology of her left lower extremity symptoms.  However, she denies any falls or recent trauma.  Do not feel that imaging of the pelvis or left lower  extremity is indicated today.  Patient's labs independently reviewed.  CBC with hemoglobin of 7.2.  Patient's baseline in the mid sevens.  Transfusion not indicated today.  On shared decision making with the patient, elective transfusion for borderline low hemoglobin versus watchful waiting discussed.  Patient prefers to hold off on transfusion at this time.  Laboratory workup also notable for hypokalemia with potassium of 3.  Given patient's renal failure, recommend increased intake via dietary means.  She should follow-up on this with her PCP to discuss long-term supplementation.  Patient does not meet criteria for inpatient admission.  She is felt to be appropriate for discharge at this time.  Discharged in stable condition.  Patient's presentation is most consistent with acute complicated illness / injury requiring diagnostic workup.         Final Clinical Impression(s) / ED Diagnoses Final diagnoses:  Chronic anemia    Rx / DC Orders ED Discharge Orders     None         Rolla Flatten, MD 12/25/22 1334    Glendora Score, MD 12/25/22 1909

## 2022-12-28 NOTE — Progress Notes (Signed)
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  May be a candidate for minimally invasive outpatient surgery.  Will discuss with patient and Dr. Malen Gauze before making definite OR plans.  Darnell Level, MD [facility] practice Office: [phone]

## 2022-12-30 ENCOUNTER — Ambulatory Visit: Payer: Self-pay | Admitting: Surgery

## 2022-12-30 ENCOUNTER — Telehealth: Payer: Self-pay | Admitting: Surgery

## 2022-12-30 NOTE — Telephone Encounter (Signed)
     Telephone call to patient with results of ultrasound and nuclear medicine parathyroid scan.  Results have been reviewed with Dr. Vallery Sa.  Will plan to proceed with minimally invasive left inferior parathyroidectomy.  At Dr. Mercy Riding request, we will plan an overnight hospital stay at [facility].  This will allow [place] to monitor the calcium levels postoperatively.  My office will be in touch with the patient and arrange scheduling of her procedure in the near future.  Darnell Level, MD [facility] practice Office: [phone]

## 2023-01-10 NOTE — Progress Notes (Unsigned)
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 Stohler returns for follow up as scheduled. Last seen by Dr. Leonides Schanz 4 weeks ago. MD and nephrology spoke and decided to defer kidney biopsy for now and start EPO, and monitor labs. 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   She presents   ROS   Past Medical History:  Diagnosis Date   Anemia    Cellulitis of scalp 02/2015   Chronic kidney disease 01/20/2021   stage V   Depression    Diabetes mellitus without complication (HCC)    History of blood transfusion    Hyperlipidemia    Hypertension      Past Surgical History:  Procedure Laterality Date   ABDOMINAL HYSTERECTOMY     AV FISTULA PLACEMENT Right 02/05/2021   Procedure: INSERTION OF RIGHT ARM ARTERIOVENOUS (AV) GORE-TEX GRAFT;  Surgeon: Leonie Douglas, MD;  Location: [facility] OR;  Service: Vascular;  Laterality: Right;   BIOPSY  01/07/2018   Procedure: BIOPSY;  Surgeon: Hilarie Fredrickson, MD;  Location: [facility] ENDOSCOPY;  Service: Endoscopy;;  Clotest   COLONOSCOPY WITH PROPOFOL N/A 01/07/2018   Procedure: COLONOSCOPY WITH PROPOFOL;  Surgeon: Hilarie Fredrickson, MD;  Location: [facility] ENDOSCOPY;  Service: Endoscopy;  Laterality: N/A;   ENTEROSCOPY N/A 06/17/2022   Procedure: ENTEROSCOPY;  Surgeon: Sherrilyn Rist, MD;  Location: [facility] ENDOSCOPY;  Service: Gastroenterology;  Laterality: N/A;   ENTEROSCOPY N/A 10/20/2022   Procedure: ENTEROSCOPY;  Surgeon: Jenel Lucks, MD;  Location: [facility] ENDOSCOPY;  Service: Gastroenterology;  Laterality: N/A;   ESOPHAGOGASTRODUODENOSCOPY (EGD) WITH PROPOFOL N/A 01/07/2018   Procedure: ESOPHAGOGASTRODUODENOSCOPY  (EGD) WITH PROPOFOL;  Surgeon: Hilarie Fredrickson, MD;  Location: [facility] ENDOSCOPY;  Service: Endoscopy;  Laterality: N/A;   ESOPHAGOGASTRODUODENOSCOPY (EGD) WITH PROPOFOL N/A 12/14/2018   Procedure: ESOPHAGOGASTRODUODENOSCOPY (EGD) WITH PROPOFOL;  Surgeon: Hilarie Fredrickson, MD;  Location: [facility] ENDOSCOPY;  Service: Endoscopy;  Laterality: N/A;   GIVENS CAPSULE STUDY N/A 10/18/2022   Procedure: GIVENS CAPSULE STUDY;  Surgeon: Napoleon Form, MD;  Location: [facility] ENDOSCOPY;  Service: Gastroenterology;  Laterality: N/A;  Per [place]our[place]   HEMOSTASIS CLIP PLACEMENT  06/17/2022   Procedure: HEMOSTASIS CLIP PLACEMENT;  Surgeon: Sherrilyn Rist, MD;  Location: [facility] ENDOSCOPY;  Service: Gastroenterology;;   HEMOSTASIS CONTROL  12/14/2018   Procedure: HEMOSTASIS CONTROL;  Surgeon: Hilarie Fredrickson, MD;  Location: [facility] ENDOSCOPY;  Service: Endoscopy;;   HOT HEMOSTASIS N/A 06/17/2022   Procedure: HOT HEMOSTASIS (ARGON PLASMA COAGULATION/BICAP);  Surgeon: Sherrilyn Rist, MD;  Location: [facility] ENDOSCOPY;  Service: Gastroenterology;  Laterality: N/A;   HOT HEMOSTASIS N/A 10/20/2022   Procedure: HOT HEMOSTASIS (ARGON PLASMA COAGULATION/BICAP);  Surgeon: Jenel Lucks, MD;  Location: [facility] ENDOSCOPY;  Service: Gastroenterology;  Laterality: N/A;   POLYPECTOMY  01/07/2018   Procedure: POLYPECTOMY;  Surgeon: Hilarie Fredrickson, MD;  Location: [facility] ENDOSCOPY;  Service: Endoscopy;;   POLYPECTOMY  10/20/2022   Procedure: POLYPECTOMY;  Surgeon: Jenel Lucks, MD;  Location: [facility] ENDOSCOPY;  Service: Gastroenterology;;   Susa Day  06/17/2022   Procedure: Susa Day;  Surgeon: Sherrilyn Rist, MD;  Location: [facility] ENDOSCOPY;  Service: Gastroenterology;;  Outpatient Encounter Medications as of 01/11/2023  Medication Sig Note   albuterol (VENTOLIN HFA) 108 (90 Base) MCG/ACT inhaler Inhale into the lungs.    atorvastatin (LIPITOR) 80 MG tablet Take 1 tablet (80 mg total) by mouth daily.    AURYXIA 1 GM 210 MG(Fe) tablet Take 420 mg  by mouth 3 (three) times daily.    B Complex-C-Folic Acid (DIALYVITE TABLET) TABS Take 1 tablet by mouth daily.    capsaicin (ZOSTRIX) 0.025 % cream Apply topically 3 (three) times daily as needed. Apply a small amount to knees three times daily as needed    carvedilol (COREG) 25 MG tablet Take 1 tablet (25 mg total) by mouth 2 (two) times daily with a meal.    cinacalcet (SENSIPAR) 60 MG tablet Take 180 mg by mouth daily.    diltiazem (CARDIZEM SR) 120 MG 12 hr capsule Take 1 capsule (120 mg total) by mouth 2 (two) times daily.    folic acid (FOLVITE) 1 MG tablet Take 1 tablet (1 mg total) by mouth daily.    Methoxy PEG-Epoetin Beta (MIRCERA IJ) Mircera    pantoprazole (PROTONIX) 40 MG tablet Take 1 tablet (40 mg total) by mouth daily for 28 days. 11/16/2022: 11-15-2022   No facility-administered encounter medications on file as of 01/11/2023.     There were no vitals filed for this visit. There is no height or weight on file to calculate BMI.   PHYSICAL EXAM GENERAL:alert, no distress and comfortable SKIN: no rash  EYES: sclera clear NECK: without mass LYMPH:  no palpable cervical or supraclavicular lymphadenopathy  LUNGS: clear with normal breathing effort HEART: regular rate & rhythm, no lower extremity edema ABDOMEN: abdomen soft, non-tender and normal bowel sounds NEURO: alert & oriented x 3 with fluent speech, no focal motor/sensory deficits Breast exam:  PAC without erythema    CBC    Component Value Date/Time   WBC 4.4 12/25/2022 1028   RBC 2.43 (L) 12/25/2022 1028   HGB 7.2 (L) 12/25/2022 1028   HGB 7.6 (L) 12/18/2022 1133   HGB 10.5 (L) 05/09/2019 1139   HCT 23.1 (L) 12/25/2022 1028   HCT 31.8 (L) 05/09/2019 1139   PLT 269 12/25/2022 1028   PLT 273 12/18/2022 1133   PLT 347 05/09/2019 1139   MCV 95.1 12/25/2022 1028   MCV 85 05/09/2019 1139   MCH 29.6 12/25/2022 1028   MCHC 31.2 12/25/2022 1028   RDW 14.6 12/25/2022 1028   RDW 12.6 05/09/2019 1139   LYMPHSABS  1.4 12/18/2022 1133   LYMPHSABS 1.7 05/09/2019 1139   MONOABS 0.4 12/18/2022 1133   EOSABS 0.1 12/18/2022 1133   EOSABS 0.1 05/09/2019 1139   BASOSABS 0.0 12/18/2022 1133   BASOSABS 0.0 05/09/2019 1139     CMP     Component Value Date/Time   NA 141 12/25/2022 1028   NA 142 05/09/2019 1139   K 3.0 (L) 12/25/2022 1028   CL 97 (L) 12/25/2022 1028   CO2 28 12/25/2022 1028   GLUCOSE 191 (H) 12/25/2022 1028   BUN 16 12/25/2022 1028   BUN 23 05/09/2019 1139   CREATININE 4.56 (H) 12/25/2022 1028   CREATININE 3.98 (H) 12/18/2022 1133   CREATININE 0.75 07/31/2013 1059   CALCIUM 9.6 12/25/2022 1028   PROT 6.5 12/18/2022 1133   PROT 6.3 05/09/2019 1139   ALBUMIN 4.1 12/18/2022 1133   ALBUMIN 4.5 05/09/2019 1139   AST 11 (L) 12/18/2022 1133   ALT 5 12/18/2022 1133   ALKPHOS 758 (  H) 12/18/2022 1133   BILITOT 0.4 12/18/2022 1133   GFRNONAA 10 (L) 12/25/2022 1028   GFRNONAA 11 (L) 12/18/2022 1133   GFRAA 18 (L) 05/09/2019 1139   GFRAA 15 (L) 12/23/2018 1110     ASSESSMENT & PLAN: 72 yo female   Elevated Serum Free Light Chain Ratio with No M protein -MM labs support smoldering myeloma, but due to her anemia and end organ damage she may have MM -metastatic bone survey was inconclusive but most consistent with hyperparathyroidism. 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  PLAN:  No orders of the defined types were placed in this encounter.     All questions were answered. The patient knows to call the clinic with any problems, questions or concerns. No barriers to learning were detected. I spent *** counseling the patient face to face. The total time spent in the appointment was *** and more than 50% was on counseling, review of test results, and coordination of care.   Santiago Glad, NP-C @DATE @

## 2023-01-11 ENCOUNTER — Other Ambulatory Visit: Payer: Self-pay

## 2023-01-11 ENCOUNTER — Other Ambulatory Visit: Payer: Self-pay | Admitting: Nurse Practitioner

## 2023-01-11 ENCOUNTER — Inpatient Hospital Stay: Payer: Medicare HMO | Admitting: Nurse Practitioner

## 2023-01-11 ENCOUNTER — Telehealth: Payer: Self-pay

## 2023-01-11 ENCOUNTER — Inpatient Hospital Stay: Payer: Medicare HMO | Attending: Hematology and Oncology

## 2023-01-11 ENCOUNTER — Encounter: Payer: Self-pay | Admitting: Nurse Practitioner

## 2023-01-11 VITALS — BP 176/64 | HR 80 | Temp 98.6°F | Resp 16 | Ht 66.0 in | Wt 116.0 lb

## 2023-01-11 DIAGNOSIS — Z992 Dependence on renal dialysis: Secondary | ICD-10-CM | POA: Insufficient documentation

## 2023-01-11 DIAGNOSIS — N186 End stage renal disease: Secondary | ICD-10-CM | POA: Diagnosis not present

## 2023-01-11 DIAGNOSIS — D472 Monoclonal gammopathy: Secondary | ICD-10-CM | POA: Insufficient documentation

## 2023-01-11 DIAGNOSIS — R768 Other specified abnormal immunological findings in serum: Secondary | ICD-10-CM

## 2023-01-11 DIAGNOSIS — D649 Anemia, unspecified: Secondary | ICD-10-CM | POA: Insufficient documentation

## 2023-01-11 DIAGNOSIS — I120 Hypertensive chronic kidney disease with stage 5 chronic kidney disease or end stage renal disease: Secondary | ICD-10-CM | POA: Insufficient documentation

## 2023-01-11 DIAGNOSIS — E1122 Type 2 diabetes mellitus with diabetic chronic kidney disease: Secondary | ICD-10-CM | POA: Diagnosis not present

## 2023-01-11 LAB — CMP (CANCER CENTER ONLY)
ALT: <5 U/L (ref 0–44)
AST: 12 U/L — ABNORMAL LOW (ref 15–41)
Albumin: 4.4 g/dL (ref 3.5–5.0)
Alkaline Phosphatase: 913 U/L — ABNORMAL HIGH (ref 38–126)
Anion gap: 14 (ref 5–15)
BUN: 32 mg/dL — ABNORMAL HIGH (ref 8–23)
CO2: 30 mmol/L (ref 22–32)
Calcium: 11.2 mg/dL — ABNORMAL HIGH (ref 8.9–10.3)
Chloride: 99 mmol/L (ref 98–111)
Creatinine: 5.13 mg/dL — ABNORMAL HIGH (ref 0.44–1.00)
GFR, Estimated: 8 mL/min — ABNORMAL LOW (ref >60)
Glucose, Bld: 114 mg/dL — ABNORMAL HIGH (ref 70–99)
Potassium: 3.5 mmol/L (ref 3.5–5.1)
Sodium: 143 mmol/L (ref 135–145)
Total Bilirubin: 0.4 mg/dL (ref <1.2)
Total Protein: 7.1 g/dL (ref 6.5–8.1)

## 2023-01-11 LAB — CBC WITH DIFFERENTIAL (CANCER CENTER ONLY)
Abs Immature Granulocytes: 0.01 10*3/uL (ref 0.00–0.07)
Basophils Absolute: 0 10*3/uL (ref 0.0–0.1)
Basophils Relative: 0 %
Eosinophils Absolute: 0.1 10*3/uL (ref 0.0–0.5)
Eosinophils Relative: 2 %
HCT: 30.7 % — ABNORMAL LOW (ref 36.0–46.0)
Hemoglobin: 9.3 g/dL — ABNORMAL LOW (ref 12.0–15.0)
Immature Granulocytes: 0 %
Lymphocytes Relative: 24 %
Lymphs Abs: 1.5 10*3/uL (ref 0.7–4.0)
MCH: 29.5 pg (ref 26.0–34.0)
MCHC: 30.3 g/dL (ref 30.0–36.0)
MCV: 97.5 fL (ref 80.0–100.0)
Monocytes Absolute: 0.4 10*3/uL (ref 0.1–1.0)
Monocytes Relative: 7 %
Neutro Abs: 4 10*3/uL (ref 1.7–7.7)
Neutrophils Relative %: 67 %
Platelet Count: 286 10*3/uL (ref 150–400)
RBC: 3.15 MIL/uL — ABNORMAL LOW (ref 3.87–5.11)
RDW: 15.3 % (ref 11.5–15.5)
WBC Count: 6 10*3/uL (ref 4.0–10.5)
nRBC: 0 % (ref 0.0–0.2)

## 2023-01-11 LAB — LACTATE DEHYDROGENASE: LDH: 171 U/L (ref 98–192)

## 2023-01-11 NOTE — Telephone Encounter (Signed)
Labs faxed to Dr Vallery Sa.  Confirmation received

## 2023-01-11 NOTE — Telephone Encounter (Signed)
-----   Message from Pollyann Samples sent at 01/11/2023 10:33 AM EST ----- Myriam Jacobson, please send CMP to her nephrologist Dr. Malen Gauze.  Thanks LB ----- Message ----- From: Leory Plowman, Lab In [place] Sent: 01/11/2023   9:38 AM EST To: Pollyann Samples, NP

## 2023-01-12 LAB — KAPPA/LAMBDA LIGHT CHAINS
Kappa free light chain: 1517.4 mg/L — ABNORMAL HIGH (ref 3.3–19.4)
Kappa, lambda light chain ratio: 30.53 — ABNORMAL HIGH (ref 0.26–1.65)
Lambda free light chains: 49.7 mg/L — ABNORMAL HIGH (ref 5.7–26.3)

## 2023-01-18 ENCOUNTER — Encounter (HOSPITAL_COMMUNITY): Payer: Self-pay

## 2023-01-19 LAB — MULTIPLE MYELOMA PANEL, SERUM
Albumin SerPl Elph-Mcnc: 4 g/dL (ref 2.9–4.4)
Albumin/Glob SerPl: 1.7 (ref 0.7–1.7)
Alpha 1: 0.3 g/dL (ref 0.0–0.4)
Alpha2 Glob SerPl Elph-Mcnc: 0.9 g/dL (ref 0.4–1.0)
B-Globulin SerPl Elph-Mcnc: 0.9 g/dL (ref 0.7–1.3)
Gamma Glob SerPl Elph-Mcnc: 0.3 g/dL — ABNORMAL LOW (ref 0.4–1.8)
Globulin, Total: 2.4 g/dL (ref 2.2–3.9)
IgA: 51 mg/dL — ABNORMAL LOW (ref 64–422)
IgG (Immunoglobin G), Serum: 374 mg/dL — ABNORMAL LOW (ref 586–1602)
IgM (Immunoglobulin M), Srm: 15 mg/dL — ABNORMAL LOW (ref 26–217)
Total Protein ELP: 6.4 g/dL (ref 6.0–8.5)

## 2023-01-31 ENCOUNTER — Other Ambulatory Visit: Payer: Self-pay | Admitting: Hematology and Oncology

## 2023-02-04 ENCOUNTER — Encounter (HOSPITAL_COMMUNITY): Payer: Self-pay

## 2023-02-04 NOTE — Progress Notes (Signed)
 Surgical Instructions   Your procedure is scheduled on Tuesday, January 7th, 2025. Report to [facility] Main Entrance A at 5:30 A.M., then check in with the Admitting office. Any questions or running late day of surgery: call [phone]  Questions prior to your surgery date: call [phone], Monday-Friday, 8am-4pm. If you experience any cold or flu symptoms such as cough, fever, chills, shortness of breath, etc. between now and your scheduled surgery, please notify us  at the above number.     Remember:  Do not eat after midnight the night before your surgery   You may drink clear liquids until 4:30 the morning of your surgery.   Clear liquids allowed are: Water, Non-Citrus Juices (without pulp), Carbonated Beverages, Clear Tea (no milk, honey, etc.), Black Coffee Only (NO MILK, CREAM OR POWDERED CREAMER of any kind), and Gatorade.    Take these medicines the morning of surgery with A SIP OF WATER: Albuterol  Inhaler (Ventolin ) - bring with you on day of surgery Atorvastatin  (Lipitor ) Carvedilol  (Coreg ) Cinacalcet (Sensipar) Diltiazem  (Cardizem ) Pantoprazole  (Protonix )   May take these medicines IF NEEDED:None.     One week prior to surgery, STOP taking any Aspirin  (unless otherwise instructed by your surgeon) Aleve, Naproxen, Ibuprofen, Motrin, Advil, Goody's, BC's, all herbal medications, fish oil, and non-prescription vitamins.    HOW TO MANAGE YOUR DIABETES BEFORE AND AFTER SURGERY  Why is it important to control my blood sugar before and after surgery? Improving blood sugar levels before and after surgery helps healing and can limit problems. A way of improving blood sugar control is eating a healthy diet by:  Eating less sugar and carbohydrates  Increasing activity/exercise  Talking with your doctor about reaching your blood sugar goals High blood sugars (greater than 180 mg/dL) can raise your risk of infections and slow your recovery, so you will need to focus  on controlling your diabetes during the weeks before surgery. Make sure that the doctor who takes care of your diabetes knows about your planned surgery including the date and location.  How do I manage my blood sugar before surgery? Check your blood sugar at least 4 times a day, starting 2 days before surgery, to make sure that the level is not too high or low.  Check your blood sugar the morning of your surgery when you wake up and every 2 hours until you get to the Short Stay unit.  If your blood sugar is less than 70 mg/dL, you will need to treat for low blood sugar: Do not take insulin . Treat a low blood sugar (less than 70 mg/dL) with  cup of clear juice (cranberry or apple), 4 glucose tablets, OR glucose gel. Recheck blood sugar in 15 minutes after treatment (to make sure it is greater than 70 mg/dL). If your blood sugar is not greater than 70 mg/dL on recheck, call [phone] for further instructions. Report your blood sugar to the short stay nurse when you get to Short Stay.  If you are admitted to the hospital after surgery: Your blood sugar will be checked by the staff and you will probably be given insulin  after surgery (instead of oral diabetes medicines) to make sure you have good blood sugar levels. The goal for blood sugar control after surgery is 80-180 mg/dL.                      Do NOT Smoke (Tobacco/Vaping) for 24 hours prior to your procedure.  If you use a CPAP at  night, you may bring your mask/headgear for your overnight stay.   You will be asked to remove any contacts, glasses, piercing's, hearing aid's, dentures/partials prior to surgery. Please bring cases for these items if needed.    Patients discharged the day of surgery will not be allowed to drive home, and someone needs to stay with them for 24 hours.  SURGICAL WAITING ROOM VISITATION Patients may have no more than 2 support people in the waiting area - these visitors may rotate.   Pre-op nurse will  coordinate an appropriate time for 1 ADULT support person, who may not rotate, to accompany patient in pre-op.  Children under the age of 7 must have an adult with them who is not the patient and must remain in the main waiting area with an adult.  If the patient needs to stay at the hospital during part of their recovery, the visitor guidelines for inpatient rooms apply.  Please refer to the [facility] website for the visitor guidelines for any additional information.   If you received a COVID test during your pre-op visit  it is requested that you wear a mask when out in public, stay away from anyone that may not be feeling well and notify your surgeon if you develop symptoms. If you have been in contact with anyone that has tested positive in the last 10 days please notify you surgeon.      Pre-operative CHG Bathing Instructions   You can play a key role in reducing the risk of infection after surgery. Your skin needs to be as free of germs as possible. You can reduce the number of germs on your skin by washing with CHG (chlorhexidine  gluconate) soap before surgery. CHG is an antiseptic soap that kills germs and continues to kill germs even after washing.   DO NOT use if you have an allergy to chlorhexidine /CHG or antibacterial soaps. If your skin becomes reddened or irritated, stop using the CHG and notify one of our RNs at [phone].              TAKE A SHOWER THE NIGHT BEFORE SURGERY AND THE DAY OF SURGERY    Please keep in mind the following:  DO NOT shave, including legs and underarms, 48 hours prior to surgery.   You may shave your face before/day of surgery.  Place clean sheets on your bed the night before surgery Use a clean washcloth (not used since being washed) for each shower. DO NOT sleep with pet's night before surgery.  CHG Shower Instructions:  Wash your face and private area with normal soap. If you choose to wash your hair, wash first with your normal shampoo.   After you use shampoo/soap, rinse your hair and body thoroughly to remove shampoo/soap residue.  Turn the water OFF and apply half the bottle of CHG soap to a CLEAN washcloth.  Apply CHG soap ONLY FROM YOUR NECK DOWN TO YOUR TOES (washing for 3-5 minutes)  DO NOT use CHG soap on face, private areas, open wounds, or sores.  Pay special attention to the area where your surgery is being performed.  If you are having back surgery, having someone wash your back for you may be helpful. Wait 2 minutes after CHG soap is applied, then you may rinse off the CHG soap.  Pat dry with a clean towel  Put on clean pajamas    Additional instructions for the day of surgery: DO NOT APPLY any lotions, deodorants, cologne, or perfumes.  Do not wear jewelry or makeup Do not wear nail polish, gel polish, artificial nails, or any other type of covering on natural nails (fingers and toes) Do not bring valuables to the hospital. [facility] is not responsible for valuables/personal belongings. Put on clean/comfortable clothes.  Please brush your teeth.  Ask your nurse before applying any prescription medications to the skin.

## 2023-02-05 ENCOUNTER — Other Ambulatory Visit: Payer: Self-pay

## 2023-02-05 ENCOUNTER — Encounter (HOSPITAL_COMMUNITY)
Admission: RE | Admit: 2023-02-05 | Discharge: 2023-02-05 | Disposition: A | Discharge status: Home / Self Care | Payer: Medicare (Managed Care) | Source: Ambulatory Visit | Attending: Surgery | Admitting: Surgery

## 2023-02-05 ENCOUNTER — Encounter (HOSPITAL_COMMUNITY): Payer: Self-pay

## 2023-02-05 VITALS — BP 173/69 | HR 76 | Temp 97.7°F | Resp 16 | Ht 66.0 in | Wt 112.0 lb

## 2023-02-05 DIAGNOSIS — D472 Monoclonal gammopathy: Secondary | ICD-10-CM | POA: Insufficient documentation

## 2023-02-05 DIAGNOSIS — Z794 Long term (current) use of insulin: Secondary | ICD-10-CM

## 2023-02-05 DIAGNOSIS — Z992 Dependence on renal dialysis: Secondary | ICD-10-CM | POA: Diagnosis not present

## 2023-02-05 DIAGNOSIS — I120 Hypertensive chronic kidney disease with stage 5 chronic kidney disease or end stage renal disease: Secondary | ICD-10-CM | POA: Insufficient documentation

## 2023-02-05 DIAGNOSIS — E1122 Type 2 diabetes mellitus with diabetic chronic kidney disease: Secondary | ICD-10-CM | POA: Insufficient documentation

## 2023-02-05 DIAGNOSIS — Z01812 Encounter for preprocedural laboratory examination: Secondary | ICD-10-CM | POA: Insufficient documentation

## 2023-02-05 DIAGNOSIS — E213 Hyperparathyroidism, unspecified: Secondary | ICD-10-CM | POA: Insufficient documentation

## 2023-02-05 DIAGNOSIS — D631 Anemia in chronic kidney disease: Secondary | ICD-10-CM | POA: Diagnosis not present

## 2023-02-05 DIAGNOSIS — N186 End stage renal disease: Secondary | ICD-10-CM | POA: Insufficient documentation

## 2023-02-05 DIAGNOSIS — E119 Type 2 diabetes mellitus without complications: Secondary | ICD-10-CM

## 2023-02-05 DIAGNOSIS — Z01818 Encounter for other preprocedural examination: Secondary | ICD-10-CM | POA: Diagnosis present

## 2023-02-05 HISTORY — DX: Hyperparathyroidism, unspecified: E21.3

## 2023-02-05 LAB — GLUCOSE, CAPILLARY: Glucose-Capillary: 142 mg/dL — ABNORMAL HIGH (ref 70–99)

## 2023-02-05 LAB — HEMOGLOBIN A1C
Hgb A1c MFr Bld: 5.1 % (ref 4.8–5.6)
Mean Plasma Glucose: 100 mg/dL

## 2023-02-05 NOTE — Progress Notes (Signed)
 PCP - Dr. Luke Miyamoto Cardiologist - Denies  PPM/ICD - Denies Device Orders - N/A Rep Notified - N/A  Chest x-ray - 10/17/22 EKG - 12/30/22 Stress Test - Denies ECHO - Denies Cardiac Cath - Denies  Sleep Study - Denies CPAP - N/A  Fasting Blood Sugar - 95-115 Checks Blood Sugar _2____ times a day  Last dose of GLP1 agonist- Denies  GLP1 instructions: N/A  Blood Thinner Instructions: Denies Aspirin  Instructions: Denies  ERAS Protcol - Clear liquids until 4:30 PRE-SURGERY Ensure or G2- none  COVID TEST- N/A   Anesthesia review: Y, HTN, DM, CKD on dialysis 3x/wk (T, TH, SAT)  Patient denies shortness of breath, fever, cough and chest pain at PAT appointment. Patient denies any respiratory issues at this time. Instructed to bring her inhaler the DOS.   All instructions explained to the patient, with a verbal understanding of the material. Patient agrees to go over the instructions while at home for a better understanding. Patient also instructed to self quarantine after being tested for COVID-19. The opportunity to ask questions was provided.

## 2023-02-07 ENCOUNTER — Encounter (HOSPITAL_COMMUNITY): Payer: Self-pay | Admitting: Surgery

## 2023-02-07 DIAGNOSIS — E210 Primary hyperparathyroidism: Secondary | ICD-10-CM | POA: Diagnosis present

## 2023-02-07 NOTE — H&P (Signed)
 REFERRING PHYSICIAN: Jerrye Katheryn Craven, MD  PROVIDER: Yvonnie Schinke Sara SPINNER, MD   Chief Complaint: New Consultation (hyperparathyroidism)  History of Present Illness:  Patient is referred by Dr. Katheryn Messier from nephrology for surgical evaluation and management of hyperparathyroidism. Patient has laboratory studies which are somewhat confusing. She has had significant hypercalcemia with a high of 11.4 but a recent serum calcium  level in September which was low at 7.8. Phosphorus level has been elevated as high as 5.8. Intact PTH level is elevated as high as 2525. Calcium  times phosphorus product is elevated at 61. Patient has noted symptoms including fatigue and bone and joint pain. Has been on Sensipar 60 mg twice daily. This has apparently been well-tolerated. Underwent a nuclear medicine parathyroid  scan on October 12, 2022. This localized a left-sided parathyroid  adenoma, likely involving the inferior parathyroid  gland. No other imaging has been performed recently. Patient presents today accompanied by her son. She is on hemodialysis. She dialyzes on Tuesdays Thursdays and Saturdays. Her access is in the right upper extremity.  Review of Systems: A complete review of systems was obtained from the patient. I have reviewed this information and discussed as appropriate with the patient. See HPI as well for other ROS.  Review of Systems  Constitutional: Positive for malaise/fatigue.  HENT: Negative.  Eyes: Negative.  Respiratory: Negative.  Cardiovascular: Negative.  Gastrointestinal: Negative.  Genitourinary: Negative.  Musculoskeletal: Positive for joint pain and myalgias.  Skin: Negative.  Neurological: Negative.  Endo/Heme/Allergies: Negative.  Psychiatric/Behavioral: Negative.    Medical History: Past Medical History:  Diagnosis Date  Anemia  Arthritis  Chronic kidney disease  Hypertension   Patient Active Problem List  Diagnosis  Hyperparathyroidism  (CMS/HHS-HCC)  ESRD (end stage renal disease) on dialysis (CMS/HHS-HCC)   History reviewed. No pertinent surgical history.   Allergies  Allergen Reactions  Aspirin  Other (See Comments)  Upset stomach, can only take coated aspirin  but prefers to not take it at all   Current Outpatient Medications on File Prior to Visit  Medication Sig Dispense Refill  albuterol  MDI, PROVENTIL , VENTOLIN , PROAIR , HFA 90 mcg/actuation inhaler Inhale into the lungs  atorvastatin  (LIPITOR ) 80 MG tablet Take by mouth  AURYXIA  210 mg iron  tablet Take by mouth  COREG  25 mg tablet Take by mouth  folic acid  (FOLVITE ) 1 MG tablet Take 1 mg by mouth once daily   No current facility-administered medications on file prior to visit.   Family History  Problem Relation Age of Onset  Diabetes Mother  High blood pressure (Hypertension) Father  Hyperlipidemia (Elevated cholesterol) Father  Diabetes Sister  High blood pressure (Hypertension) Sister  High blood pressure (Hypertension) Brother  Diabetes Brother    Social History   Tobacco Use  Smoking Status Every Day  Current packs/day: 0.50  Types: Cigarettes  Smokeless Tobacco Never    Social History   Socioeconomic History  Marital status: Single  Tobacco Use  Smoking status: Every Day  Current packs/day: 0.50  Types: Cigarettes  Smokeless tobacco: Never  Substance and Sexual Activity  Alcohol use: Not Currently  Drug use: Never   Social Drivers of Health   Food Insecurity: No Food Insecurity (10/17/2022)  Received from [facility]  Hunger Vital Sign  Worried About Running Out of Food in the Last Year: Never true  Ran Out of Food in the Last Year: Never true  Transportation Needs: No Transportation Needs (10/17/2022)  Received from [facility] - Transportation  Lack of Transportation (Medical): No  Lack of Transportation (Non-Medical): No   Objective:   Vitals:  BP: 134/60  Pulse: 66  Temp: 36.7 C (98 F)  SpO2: 96%   Weight: 54.9 kg (121 lb)  Height: 167.6 cm (5' 6)   Body mass index is 19.53 kg/m.  Physical Exam   GENERAL APPEARANCE Comfortable, no acute issues Development: normal Gross deformities: none  SKIN Rash, lesions, ulcers: none Induration, erythema: none Nodules: none palpable  EYES Conjunctiva and lids: normal Pupils: equal and reactive  EARS, NOSE, MOUTH, THROAT External ears: no lesion or deformity External nose: no lesion or deformity Hearing: grossly normal  NECK Symmetric: yes Trachea: midline Thyroid : no palpable nodules in the thyroid  bed  ABDOMEN Not assessed  GENITOURINARY/RECTAL Not assessed  MUSCULOSKELETAL Station and gait: normal Digits and nails: no clubbing or cyanosis Muscle strength: grossly normal all extremities Range of motion: grossly normal all extremities Deformity: AV fistula right upper arm  LYMPHATIC Cervical: none palpable Supraclavicular: none palpable  PSYCHIATRIC Oriented to person, place, and time: yes Mood and affect: normal for situation Judgment and insight: appropriate for situation   Assessment and Plan:   Hyperparathyroidism (CMS/HHS-HCC) ESRD (end stage renal disease) on dialysis (CMS/HHS-HCC)  Patient is referred for surgical evaluation and recommendations regarding management of hyperparathyroidism. The patient's nephrologist is Dr. Katheryn Saba.  The clinical picture is somewhat confusing. Patient has had several months of hypercalcemia. However she has elevated serum phosphorus levels and her intact PTH level is markedly elevated in the range of 2500. 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 However, the patient underwent a nuclear medicine parathyroid  scan which localized a single bland adenoma in the left inferior position.  I would like to obtain an ultrasound examination of the neck. This may reveal multiple enlarged parathyroid  glands which would be more  consistent with secondary hyperparathyroidism which I think is likely her diagnosis. In that case she would require a neck exploration, total parathyroidectomy, and autotransplantation to the left forearm. We discussed this procedure today briefly. If the ultrasound reveals only a left inferior parathyroid  gland to be enlarged, then she may be a candidate for parathyroidectomy of a single gland. This could be performed as an outpatient procedure. We also discussed this today.  We will obtain the ultrasound examination. I will contact the patient with the results. I will discuss her case with Dr. Saba. We will then decide on the appropriate operative approach and make further plans for surgery at [facility].  Patient and her son understand and agree to proceed.  [name], MD [facility] practice Office: [phone]

## 2023-02-08 NOTE — Progress Notes (Signed)
 Anesthesia Chart Review:   Case: [id number] Date/Time: 02/09/23 0715   Procedure: LEFT INFERIOR PARATHYROIDECTOMY (Left)   Anesthesia type: General   Pre-op diagnosis: HYPERPARATHYROIDISM   [facility] / [facility]   Surgeons: Eletha Boas, MD       DISCUSSION: Patient is a 73 year old female scheduled for the above procedure.  History includes smoking, HTN, DM2, ESRD (HD TTS), HLD, anemia (IDA, anemia of chronic disease, & small bowel AVMs; s/p APC duodenal angiodysplastic lesions & jejunal polyp biopsy 10/20/22), hyperparathyroidism.   She is followed by [facility] Dr. Federico. Evaluated on 11/04/22 for elevated serum free light chain ration with no M protein. Additional labs and bone marrow biopsy ordered. S/p CT guided bone marrow biopsy on 11/16/22 by IR. Based on results, he was unsure if she had full multiple myeloma or smoldering myeloma. 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  At this time is unclear if her renal dysfunction is being caused by the myeloma, or if the renal dysfunction is the cause of her hypercalcemia from secondary hyperparathyroidism and renal dysfunction causing her anemia but the smoldering myeloma being indolent. Per 01/11/23 visit with APP, after discussion with nephrology decision was made to defer kidney biopsy for now and start EPO, and monitor labs. 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  Per 12/30/22 telephone encounter by Dr. Eletha, At Dr. Kelsey request, we will plan an overnight hospital stay at [facility].  This will allow us  to monitor the calcium  levels postoperatively.  Anesthesia team to evaluate on the day of surgery. She will need iSTAT8 on arrival. By notes, she has a RUE AVGG for dialysis access.     VS: BP (!) 173/69    Pulse 76   Temp 36.5 C   Resp 16   Ht 5' 6 (1.676 m)   Wt 50.8 kg   SpO2 100%   BMI 18.08 kg/m   PROVIDERS: Leontine Cramp, NP is PCP  Jerrye Mar, MD is nephrologist Federico Norleen CLORE, MD is [facility]   LABS: Most recent lab results in [facility] include: Lab Results  Component Value Date   WBC 6.0 01/11/2023   HGB 9.3 (L) 01/11/2023   HCT 30.7 (L) 01/11/2023   PLT 286 01/11/2023   GLUCOSE 114 (H) 01/11/2023   ALT <5 01/11/2023   AST 12 (L) 01/11/2023   NA 143 01/11/2023   K 3.5 01/11/2023   CL 99 01/11/2023   CREATININE 5.13 (H) 01/11/2023   BUN 32 (H) 01/11/2023   CO2 30 01/11/2023   INR 1.1 06/15/2022   HGBA1C 5.1 02/05/2023   Labs from dialysis center (see Care Everywhere) show H/H 11.2/33.6%, K 4.5 on 03/07/23. She will need an iSTAT8  on arrival.    IMAGES: Bone Survey 11/18/22: IMPRESSION: 1. 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 Correlation with recent bone marrow biopsy recommended.  CXR 10/17/22: FINDINGS: Heart size is normal. Low lung volumes. Chronic coarsened interstitial markings. No pleural fluid, airspace consolidation or frank interstitial edema. No bone abnormalities. IMPRESSION: 1. No acute findings. 2. Chronic interstitial lung disease.  NM Parathyroid  Scan 10/12/22: IMPRESSION: Findings are consistent with a left-sided parathyroid  adenoma, likely involving the inferior gland.   EKG: Normal sinus rhythm Left axis deviation Left ventricular  hypertrophy with repolarization abnormality ( R in aVL , Cornell product ) Prolonged QT (QT/QTcB 468/505 ms) Abnormal ECG When compared with ECG of 17-Oct-2022 08:12, PREVIOUS ECG IS PRESENT Confirmed by Dasie Faden ([id number]) on 12/26/2022 11:21:17 AM   CV: Echo 02/08/19: IMPRESSIONS   1. Left ventricular ejection fraction, by visual estimation, is 55 to  60%. The left ventricle has normal  function. There is moderately increased  left ventricular hypertrophy.   2. Left ventricular diastolic parameters are consistent with Grade I  diastolic dysfunction (impaired relaxation).   3. The left ventricle has no regional wall motion abnormalities.   4. Global right ventricle has normal systolic function.The right  ventricular size is normal. No increase in right ventricular wall  thickness.   5. Left atrial size was normal.   6. Right atrial size was normal.   7. Presence of pericardial fat pad.   8. Trivial pericardial effusion is present.   9. The mitral valve is grossly normal. Trivial mitral valve  regurgitation.  10. The tricuspid valve is grossly normal.  11. The aortic valve is tricuspid. Aortic valve regurgitation is not  visualized. No evidence of aortic valve sclerosis or stenosis.  12. Pulmonic regurgitation is mild.  13. The pulmonic valve was grossly normal. Pulmonic valve regurgitation is  mild.  14. Normal pulmonary artery systolic pressure.  15. The tricuspid regurgitant velocity is 2.36 m/s, and with an assumed  right atrial pressure of 3 mmHg, the estimated right ventricular systolic  pressure is normal at 25.2 mmHg.  16. The inferior vena cava is normal in size with greater than 50%  respiratory variability, suggesting right atrial pressure of 3 mmHg.  17. A prior study was performed on 12/13/2018.  18. Changes from prior study are noted.  19. Grade I DD now present. Normal RVSP.    Past Medical History:  Diagnosis Date   Anemia    Cellulitis of scalp 02/2015   Chronic kidney disease 01/20/2021   stage V   Depression    Diabetes mellitus without complication (HCC)    History of blood transfusion 11/2022   Hyperlipidemia    Hyperparathyroidism (HCC)    Hypertension     Past Surgical History:  Procedure Laterality Date   ABDOMINAL HYSTERECTOMY     AV FISTULA PLACEMENT Right 02/05/2021   Procedure: INSERTION OF RIGHT ARM ARTERIOVENOUS (AV)  GORE-TEX GRAFT;  Surgeon: Magda Debby SAILOR, MD;  Location: [facility] OR;  Service: Vascular;  Laterality: Right;   BIOPSY  01/07/2018   Procedure: BIOPSY;  Surgeon: Abran Norleen SAILOR, MD;  Location: [facility] ENDOSCOPY;  Service: Endoscopy;;  Clotest   COLONOSCOPY WITH PROPOFOL  N/A 01/07/2018   Procedure: COLONOSCOPY WITH PROPOFOL ;  Surgeon: Abran Norleen SAILOR, MD;  Location: [facility] ENDOSCOPY;  Service: Endoscopy;  Laterality: N/A;   ENTEROSCOPY N/A 06/17/2022   Procedure: ENTEROSCOPY;  Surgeon: Legrand Victory LITTIE DOUGLAS, MD;  Location: [facility] ENDOSCOPY;  Service: Gastroenterology;  Laterality: N/A;   ENTEROSCOPY N/A 10/20/2022   Procedure: ENTEROSCOPY;  Surgeon: Stacia Glendia BRAVO, MD;  Location: [facility] ENDOSCOPY;  Service: Gastroenterology;  Laterality: N/A;   ESOPHAGOGASTRODUODENOSCOPY (EGD) WITH PROPOFOL  N/A 01/07/2018   Procedure: ESOPHAGOGASTRODUODENOSCOPY (EGD) WITH PROPOFOL ;  Surgeon: Abran Norleen SAILOR, MD;  Location: [facility] ENDOSCOPY;  Service: Endoscopy;  Laterality: N/A;   ESOPHAGOGASTRODUODENOSCOPY (EGD) WITH PROPOFOL  N/A 12/14/2018   Procedure: ESOPHAGOGASTRODUODENOSCOPY (EGD) WITH PROPOFOL ;  Surgeon: Abran Norleen SAILOR, MD;  Location: [facility] ENDOSCOPY;  Service: Endoscopy;  Laterality: N/A;   GIVENS CAPSULE STUDY N/A 10/18/2022  Procedure: GIVENS CAPSULE STUDY;  Surgeon: Shila Gustav GAILS, MD;  Location: [facility] ENDOSCOPY;  Service: Gastroenterology;  Laterality: N/A;  Per Mansouraty   HEMOSTASIS CLIP PLACEMENT  06/17/2022   Procedure: HEMOSTASIS CLIP PLACEMENT;  Surgeon: Legrand Victory LITTIE DOUGLAS, MD;  Location: [facility] ENDOSCOPY;  Service: Gastroenterology;;   HEMOSTASIS CONTROL  12/14/2018   Procedure: HEMOSTASIS CONTROL;  Surgeon: Abran Norleen SAILOR, MD;  Location: [facility] ENDOSCOPY;  Service: Endoscopy;;   HOT HEMOSTASIS N/A 06/17/2022   Procedure: HOT HEMOSTASIS (ARGON PLASMA COAGULATION/BICAP);  Surgeon: Legrand Victory LITTIE DOUGLAS, MD;  Location: [facility] ENDOSCOPY;  Service: Gastroenterology;  Laterality: N/A;   HOT HEMOSTASIS N/A 10/20/2022   Procedure: HOT HEMOSTASIS (ARGON  PLASMA COAGULATION/BICAP);  Surgeon: Stacia Glendia BRAVO, MD;  Location: [facility] ENDOSCOPY;  Service: Gastroenterology;  Laterality: N/A;   POLYPECTOMY  01/07/2018   Procedure: POLYPECTOMY;  Surgeon: Abran Norleen SAILOR, MD;  Location: [facility] ENDOSCOPY;  Service: Endoscopy;;   POLYPECTOMY  10/20/2022   Procedure: POLYPECTOMY;  Surgeon: Stacia Glendia BRAVO, MD;  Location: [facility] ENDOSCOPY;  Service: Gastroenterology;;   MATIAS  06/17/2022   Procedure: MATIAS;  Surgeon: Legrand Victory LITTIE DOUGLAS, MD;  Location: [facility] ENDOSCOPY;  Service: Gastroenterology;;    MEDICATIONS:  albuterol  (VENTOLIN  HFA) 108 (90 Base) MCG/ACT inhaler   atorvastatin  (LIPITOR ) 80 MG tablet   AURYXIA  1 GM 210 MG(Fe) tablet   B Complex-C-Folic Acid  (DIALYVITE TABLET) TABS   capsaicin  (ZOSTRIX) 0.025 % cream   carvedilol  (COREG ) 25 MG tablet   cinacalcet (SENSIPAR) 60 MG tablet   diltiazem  (CARDIZEM  SR) 120 MG 12 hr capsule   folic acid  (FOLVITE ) 1 MG tablet   Methoxy PEG-Epoetin  Beta (MIRCERA IJ)   pantoprazole  (PROTONIX ) 40 MG tablet   No current facility-administered medications for this encounter.    Isaiah Ruder, PA-C Surgical Short Stay/Anesthesiology [facility] Phone [phone] [facility] Phone [phone] 02/08/2023 11:19 AM

## 2023-02-08 NOTE — Anesthesia Preprocedure Evaluation (Addendum)
 Anesthesia Evaluation  Patient identified by MRN, date of birth, ID band Patient awake    Reviewed: Allergy & Precautions, NPO status , Patient's Chart, lab work & pertinent test results, reviewed documented beta blocker date and time   History of Anesthesia Complications Negative for: history of anesthetic complications  Airway Mallampati: I  TM Distance: >3 FB Neck ROM: Full    Dental  (+) Edentulous Upper, Edentulous Lower   Pulmonary COPD,  COPD inhaler, Current Smoker and Patient abstained from smoking.   breath sounds clear to auscultation       Cardiovascular hypertension, Pt. on medications and Pt. on home beta blockers (-) angina  Rhythm:Regular Rate:Normal  '21 ECHO: EF 55-60%, mod LVH, Grade 1 DD, normal RVF, trivial MR   Neuro/Psych  Headaches   Depression       GI/Hepatic negative GI ROS, Neg liver ROS,,,  Endo/Other  diabetes (glu 98)    Renal/GU ESRF and DialysisRenal disease (K+ 4.0)Dialysis yesterday     Musculoskeletal   Abdominal   Peds  Hematology negative hematology ROS (+)   Anesthesia Other Findings   Reproductive/Obstetrics                             Anesthesia Physical Anesthesia Plan  ASA: 3  Anesthesia Plan: General   Post-op Pain Management:    Induction: Intravenous  PONV Risk Score and Plan: 2 and Ondansetron  and Dexamethasone   Airway Management Planned: Oral ETT  Additional Equipment: None  Intra-op Plan:   Post-operative Plan: Extubation in OR  Informed Consent: I have reviewed the patients History and Physical, chart, labs and discussed the procedure including the risks, benefits and alternatives for the proposed anesthesia with the patient or authorized representative who has indicated his/her understanding and acceptance.     Dental advisory given  Plan Discussed with: CRNA and Surgeon  Anesthesia Plan Comments: (PAT note written  02/08/2023 by Allison Zelenak, PA-C.  )       Anesthesia Quick Evaluation

## 2023-02-09 ENCOUNTER — Ambulatory Visit (HOSPITAL_COMMUNITY): Payer: Self-pay | Admitting: Certified Registered Nurse Anesthetist

## 2023-02-09 ENCOUNTER — Other Ambulatory Visit: Payer: Self-pay

## 2023-02-09 ENCOUNTER — Encounter (HOSPITAL_COMMUNITY): Payer: Self-pay | Admitting: Surgery

## 2023-02-09 ENCOUNTER — Encounter (HOSPITAL_COMMUNITY)
Admission: RE | Disposition: A | Discharge status: Home / Self Care | Payer: Self-pay | Source: Home / Self Care | Attending: Surgery

## 2023-02-09 ENCOUNTER — Ambulatory Visit (HOSPITAL_COMMUNITY): Payer: Medicare (Managed Care) | Admitting: Certified Registered Nurse Anesthetist

## 2023-02-09 ENCOUNTER — Inpatient Hospital Stay (HOSPITAL_COMMUNITY)
Admission: RE | Admit: 2023-02-09 | Discharge: 2023-02-14 | DRG: 625 | Disposition: A | Discharge status: Home / Self Care | Payer: Medicare (Managed Care) | Attending: Surgery | Admitting: Surgery

## 2023-02-09 DIAGNOSIS — F17210 Nicotine dependence, cigarettes, uncomplicated: Secondary | ICD-10-CM | POA: Diagnosis present

## 2023-02-09 DIAGNOSIS — I509 Heart failure, unspecified: Secondary | ICD-10-CM | POA: Diagnosis not present

## 2023-02-09 DIAGNOSIS — E785 Hyperlipidemia, unspecified: Secondary | ICD-10-CM | POA: Diagnosis present

## 2023-02-09 DIAGNOSIS — I132 Hypertensive heart and chronic kidney disease with heart failure and with stage 5 chronic kidney disease, or end stage renal disease: Secondary | ICD-10-CM | POA: Diagnosis not present

## 2023-02-09 DIAGNOSIS — E119 Type 2 diabetes mellitus without complications: Secondary | ICD-10-CM

## 2023-02-09 DIAGNOSIS — D351 Benign neoplasm of parathyroid gland: Principal | ICD-10-CM | POA: Diagnosis present

## 2023-02-09 DIAGNOSIS — E210 Primary hyperparathyroidism: Secondary | ICD-10-CM | POA: Diagnosis present

## 2023-02-09 DIAGNOSIS — Z79899 Other long term (current) drug therapy: Secondary | ICD-10-CM

## 2023-02-09 DIAGNOSIS — N2581 Secondary hyperparathyroidism of renal origin: Secondary | ICD-10-CM | POA: Diagnosis present

## 2023-02-09 DIAGNOSIS — Z833 Family history of diabetes mellitus: Secondary | ICD-10-CM

## 2023-02-09 DIAGNOSIS — N186 End stage renal disease: Secondary | ICD-10-CM

## 2023-02-09 DIAGNOSIS — D631 Anemia in chronic kidney disease: Secondary | ICD-10-CM | POA: Diagnosis present

## 2023-02-09 DIAGNOSIS — Z886 Allergy status to analgesic agent status: Secondary | ICD-10-CM

## 2023-02-09 DIAGNOSIS — Z992 Dependence on renal dialysis: Secondary | ICD-10-CM

## 2023-02-09 DIAGNOSIS — Z808 Family history of malignant neoplasm of other organs or systems: Secondary | ICD-10-CM

## 2023-02-09 DIAGNOSIS — Z8249 Family history of ischemic heart disease and other diseases of the circulatory system: Secondary | ICD-10-CM

## 2023-02-09 DIAGNOSIS — D472 Monoclonal gammopathy: Secondary | ICD-10-CM | POA: Diagnosis present

## 2023-02-09 DIAGNOSIS — E213 Hyperparathyroidism, unspecified: Principal | ICD-10-CM | POA: Diagnosis present

## 2023-02-09 DIAGNOSIS — E1122 Type 2 diabetes mellitus with diabetic chronic kidney disease: Secondary | ICD-10-CM | POA: Diagnosis present

## 2023-02-09 DIAGNOSIS — I120 Hypertensive chronic kidney disease with stage 5 chronic kidney disease or end stage renal disease: Secondary | ICD-10-CM | POA: Diagnosis present

## 2023-02-09 DIAGNOSIS — E875 Hyperkalemia: Secondary | ICD-10-CM | POA: Diagnosis present

## 2023-02-09 HISTORY — PX: PARATHYROIDECTOMY: SHX19

## 2023-02-09 LAB — RENAL FUNCTION PANEL
Albumin: 3.5 g/dL (ref 3.5–5.0)
Anion gap: 16 — ABNORMAL HIGH (ref 5–15)
BUN: 34 mg/dL — ABNORMAL HIGH (ref 8–23)
CO2: 21 mmol/L — ABNORMAL LOW (ref 22–32)
Calcium: 7.7 mg/dL — ABNORMAL LOW (ref 8.9–10.3)
Chloride: 101 mmol/L (ref 98–111)
Creatinine, Ser: 5.02 mg/dL — ABNORMAL HIGH (ref 0.44–1.00)
GFR, Estimated: 9 mL/min — ABNORMAL LOW (ref >60)
Glucose, Bld: 229 mg/dL — ABNORMAL HIGH (ref 70–99)
Phosphorus: 4.6 mg/dL (ref 2.5–4.6)
Potassium: 5.9 mmol/L — ABNORMAL HIGH (ref 3.5–5.1)
Sodium: 138 mmol/L (ref 135–145)

## 2023-02-09 LAB — POCT I-STAT, CHEM 8
BUN: 26 mg/dL — ABNORMAL HIGH (ref 8–23)
Calcium, Ion: 1.25 mmol/L (ref 1.15–1.40)
Chloride: 103 mmol/L (ref 98–111)
Creatinine, Ser: 4.7 mg/dL — ABNORMAL HIGH (ref 0.44–1.00)
Glucose, Bld: 98 mg/dL (ref 70–99)
HCT: 37 % (ref 36.0–46.0)
Hemoglobin: 12.6 g/dL (ref 12.0–15.0)
Potassium: 4 mmol/L (ref 3.5–5.1)
Sodium: 141 mmol/L (ref 135–145)
TCO2: 26 mmol/L (ref 22–32)

## 2023-02-09 LAB — GLUCOSE, CAPILLARY
Glucose-Capillary: 93 mg/dL (ref 70–99)
Glucose-Capillary: 101 mg/dL — ABNORMAL HIGH (ref 70–99)

## 2023-02-09 SURGERY — PARATHYROIDECTOMY
Anesthesia: General | Site: Neck | Laterality: Left

## 2023-02-09 MED ORDER — FENTANYL CITRATE (PF) 250 MCG/5ML IJ SOLN
INTRAMUSCULAR | Status: DC | PRN
  Administered 2023-02-09: 100 ug via INTRAVENOUS

## 2023-02-09 MED ORDER — SUGAMMADEX SODIUM 200 MG/2ML IV SOLN
INTRAVENOUS | Status: DC | PRN
  Administered 2023-02-09: 200 mg via INTRAVENOUS
  Administered 2023-02-09: 50 mg via INTRAVENOUS

## 2023-02-09 MED ORDER — CHLORHEXIDINE GLUCONATE CLOTH 2 % EX PADS: 6.0000 | MEDICATED_PAD | Freq: Once | CUTANEOUS | Status: DC

## 2023-02-09 MED ORDER — ALBUMIN HUMAN 5 % IV SOLN: INTRAVENOUS | Status: DC | PRN

## 2023-02-09 MED ORDER — ROCURONIUM BROMIDE 10 MG/ML (PF) SYRINGE
PREFILLED_SYRINGE | INTRAVENOUS | Status: DC | PRN
  Administered 2023-02-09: 50 mg via INTRAVENOUS
  Administered 2023-02-09: 30 mg via INTRAVENOUS

## 2023-02-09 MED ORDER — 0.9 % SODIUM CHLORIDE (POUR BTL) OPTIME
TOPICAL | Status: DC | PRN
  Administered 2023-02-09: 1000 mL

## 2023-02-09 MED ORDER — CALCITRIOL 0.5 MCG PO CAPS
2.0000 ug | ORAL_CAPSULE | Freq: Every day | ORAL | Status: DC
  Administered 2023-02-09 – 2023-02-10 (×2): 2 ug via ORAL
  Filled 2023-02-09 (×2): qty 4

## 2023-02-09 MED ORDER — MEPERIDINE HCL 25 MG/ML IJ SOLN: 6.2500 mg | INTRAMUSCULAR | Status: DC | PRN

## 2023-02-09 MED ORDER — CHLORHEXIDINE GLUCONATE 0.12 % MT SOLN
15.0000 mL | Freq: Once | OROMUCOSAL | Status: AC
Start: 2023-02-09 — End: 2023-02-09
  Administered 2023-02-09: 15 mL via OROMUCOSAL
  Filled 2023-02-09: qty 15

## 2023-02-09 MED ORDER — DEXAMETHASONE SODIUM PHOSPHATE 10 MG/ML IJ SOLN
INTRAMUSCULAR | Status: DC | PRN
  Administered 2023-02-09: 4 mg via INTRAVENOUS

## 2023-02-09 MED ORDER — ONDANSETRON HCL 4 MG/2ML IJ SOLN: 4.0000 mg | Freq: Four times a day (QID) | INTRAMUSCULAR | Status: DC | PRN

## 2023-02-09 MED ORDER — HEMOSTATIC AGENTS (NO CHARGE) OPTIME
TOPICAL | Status: DC | PRN
  Administered 2023-02-09: 1 via TOPICAL

## 2023-02-09 MED ORDER — MIDAZOLAM HCL 2 MG/2ML IJ SOLN
INTRAMUSCULAR | Status: DC | PRN
  Administered 2023-02-09: 2 mg via INTRAVENOUS

## 2023-02-09 MED ORDER — BUPIVACAINE HCL (PF) 0.25 % IJ SOLN
INTRAMUSCULAR | Status: DC | PRN
  Administered 2023-02-09: 6 mL

## 2023-02-09 MED ORDER — PHENYLEPHRINE 80 MCG/ML (10ML) SYRINGE FOR IV PUSH (FOR BLOOD PRESSURE SUPPORT)
PREFILLED_SYRINGE | INTRAVENOUS | Status: DC | PRN
  Administered 2023-02-09: 160 ug via INTRAVENOUS

## 2023-02-09 MED ORDER — HYDROMORPHONE HCL 1 MG/ML IJ SOLN: 1.0000 mg | INTRAMUSCULAR | Status: DC | PRN

## 2023-02-09 MED ORDER — ACETAMINOPHEN 650 MG RE SUPP: 650.0000 mg | Freq: Four times a day (QID) | RECTAL | Status: DC | PRN

## 2023-02-09 MED ORDER — CARVEDILOL 25 MG PO TABS
25.0000 mg | ORAL_TABLET | Freq: Two times a day (BID) | ORAL | Status: DC
  Administered 2023-02-09 – 2023-02-14 (×10): 25 mg via ORAL
  Filled 2023-02-09 (×10): qty 1

## 2023-02-09 MED ORDER — OXYCODONE HCL 5 MG/5ML PO SOLN: 5.0000 mg | Freq: Once | ORAL | Status: DC | PRN

## 2023-02-09 MED ORDER — BUPIVACAINE HCL (PF) 0.25 % IJ SOLN
INTRAMUSCULAR | Status: AC
  Filled 2023-02-09: qty 30

## 2023-02-09 MED ORDER — OXYCODONE HCL 5 MG PO TABS
5.0000 mg | ORAL_TABLET | ORAL | Status: DC | PRN
  Administered 2023-02-10 (×2): 10 mg via ORAL
  Administered 2023-02-11: 5 mg via ORAL
  Administered 2023-02-12: 10 mg via ORAL
  Administered 2023-02-12: 5 mg via ORAL
  Filled 2023-02-09 (×3): qty 2
  Filled 2023-02-09: qty 1
  Filled 2023-02-09: qty 2
  Filled 2023-02-09: qty 1

## 2023-02-09 MED ORDER — DEXTROSE-SODIUM CHLORIDE 5-0.9 % IV SOLN: INTRAVENOUS | Status: AC

## 2023-02-09 MED ORDER — FENTANYL CITRATE (PF) 100 MCG/2ML IJ SOLN: 25.0000 ug | INTRAMUSCULAR | Status: DC | PRN

## 2023-02-09 MED ORDER — ONDANSETRON HCL 4 MG/2ML IJ SOLN
INTRAMUSCULAR | Status: DC | PRN
  Administered 2023-02-09: 4 mg via INTRAVENOUS

## 2023-02-09 MED ORDER — ORAL CARE MOUTH RINSE: 15.0000 mL | Freq: Once | OROMUCOSAL | Status: AC

## 2023-02-09 MED ORDER — OXYCODONE HCL 5 MG PO TABS: 5.0000 mg | ORAL_TABLET | Freq: Once | ORAL | Status: DC | PRN

## 2023-02-09 MED ORDER — ACETAMINOPHEN 325 MG PO TABS
650.0000 mg | ORAL_TABLET | Freq: Four times a day (QID) | ORAL | Status: DC | PRN
  Administered 2023-02-10: 650 mg via ORAL
  Filled 2023-02-09: qty 2

## 2023-02-09 MED ORDER — ACETAMINOPHEN 500 MG PO TABS
1000.0000 mg | ORAL_TABLET | Freq: Once | ORAL | Status: AC
Start: 2023-02-09 — End: 2023-02-09
  Administered 2023-02-09: 1000 mg via ORAL
  Filled 2023-02-09: qty 2

## 2023-02-09 MED ORDER — FENTANYL CITRATE (PF) 250 MCG/5ML IJ SOLN
INTRAMUSCULAR | Status: AC
  Filled 2023-02-09: qty 5

## 2023-02-09 MED ORDER — ONDANSETRON 4 MG PO TBDP: 4.0000 mg | ORAL_TABLET | Freq: Four times a day (QID) | ORAL | Status: DC | PRN

## 2023-02-09 MED ORDER — SODIUM ZIRCONIUM CYCLOSILICATE 10 G PO PACK
10.0000 g | PACK | ORAL | Status: AC
  Administered 2023-02-09: 10 g via ORAL
  Filled 2023-02-09: qty 1

## 2023-02-09 MED ORDER — PROPOFOL 10 MG/ML IV BOLUS
INTRAVENOUS | Status: DC | PRN
  Administered 2023-02-09: 100 mg via INTRAVENOUS

## 2023-02-09 MED ORDER — FERRIC CITRATE 1 GM 210 MG(FE) PO TABS
420.0000 mg | ORAL_TABLET | Freq: Three times a day (TID) | ORAL | Status: DC
  Administered 2023-02-09: 420 mg via ORAL
  Filled 2023-02-09 (×3): qty 2

## 2023-02-09 MED ORDER — TRAMADOL HCL 50 MG PO TABS: 50.0000 mg | ORAL_TABLET | Freq: Four times a day (QID) | ORAL | Status: DC | PRN

## 2023-02-09 MED ORDER — CEFAZOLIN SODIUM-DEXTROSE 2-4 GM/100ML-% IV SOLN
2.0000 g | INTRAVENOUS | Status: AC
  Administered 2023-02-09: 2 g via INTRAVENOUS
  Filled 2023-02-09: qty 100

## 2023-02-09 MED ORDER — LACTATED RINGERS IV SOLN: INTRAVENOUS | Status: DC

## 2023-02-09 MED ORDER — SODIUM CHLORIDE 0.9 % IV SOLN: INTRAVENOUS | Status: DC | PRN

## 2023-02-09 MED ORDER — MIDAZOLAM HCL 2 MG/2ML IJ SOLN: 0.5000 mg | Freq: Once | INTRAMUSCULAR | Status: DC | PRN

## 2023-02-09 MED ORDER — PROPOFOL 10 MG/ML IV BOLUS
INTRAVENOUS | Status: AC
  Filled 2023-02-09: qty 20

## 2023-02-09 MED ORDER — MIDAZOLAM HCL 2 MG/2ML IJ SOLN
INTRAMUSCULAR | Status: AC
Start: 2023-02-09 — End: ?
  Filled 2023-02-09: qty 2

## 2023-02-09 SURGICAL SUPPLY — 41 items
ATTRACTOMAT 16X20 MAGNETIC DRP (DRAPES) ×2 IMPLANT
BAG COUNTER SPONGE SURGICOUNT (BAG) ×2 IMPLANT
BLADE SURG 15 STRL LF DISP TIS (BLADE) ×2 IMPLANT
CANISTER SUCT 3000ML PPV (MISCELLANEOUS) ×2 IMPLANT
CHLORAPREP W/TINT 26 (MISCELLANEOUS) ×2 IMPLANT
CLIP TI MEDIUM 6 (CLIP) ×2 IMPLANT
CLIP TI WIDE RED SMALL 6 (CLIP) ×2 IMPLANT
CNTNR URN SCR LID CUP LEK RST (MISCELLANEOUS) ×2 IMPLANT
COVER SURGICAL LIGHT HANDLE (MISCELLANEOUS) ×2 IMPLANT
DERMABOND ADVANCED .7 DNX12 (GAUZE/BANDAGES/DRESSINGS) IMPLANT
DRAPE LAPAROTOMY 100X72 PEDS (DRAPES) ×2 IMPLANT
DRAPE UTILITY XL STRL (DRAPES) ×2 IMPLANT
ELECT CAUTERY BLADE 6.4 (BLADE) ×2 IMPLANT
ELECT REM PT RETURN 9FT ADLT (ELECTROSURGICAL) ×1
ELECTRODE REM PT RTRN 9FT ADLT (ELECTROSURGICAL) ×2 IMPLANT
GAUZE 4X4 16PLY ~~LOC~~+RFID DBL (SPONGE) ×2 IMPLANT
GAUZE SPONGE 2X2 8PLY STRL LF (GAUZE/BANDAGES/DRESSINGS) ×2 IMPLANT
GLOVE SURG ORTHO 8.0 STRL STRW (GLOVE) ×2 IMPLANT
GOWN STRL REUS W/ TWL LRG LVL3 (GOWN DISPOSABLE) ×2 IMPLANT
GOWN STRL REUS W/ TWL XL LVL3 (GOWN DISPOSABLE) ×2 IMPLANT
HEMOSTAT SURGICEL 2X4 FIBR (HEMOSTASIS) ×2 IMPLANT
ILLUMINATOR WAVEGUIDE N/F (MISCELLANEOUS) ×2 IMPLANT
KIT BASIN OR (CUSTOM PROCEDURE TRAY) ×2 IMPLANT
KIT TURNOVER KIT B (KITS) ×2 IMPLANT
NDL HYPO 25GX1X1/2 BEV (NEEDLE) ×2 IMPLANT
NEEDLE HYPO 25GX1X1/2 BEV (NEEDLE) ×1
NS IRRIG 1000ML POUR BTL (IV SOLUTION) ×2 IMPLANT
PAD ARMBOARD 7.5X6 YLW CONV (MISCELLANEOUS) ×2 IMPLANT
PENCIL BUTTON HOLSTER BLD 10FT (ELECTRODE) ×2 IMPLANT
POSITIONER HEAD DONUT 9IN (MISCELLANEOUS) ×2 IMPLANT
SHEARS HARMONIC 9CM CVD (BLADE) ×2 IMPLANT
STRIP CLOSURE SKIN 1/2X4 (GAUZE/BANDAGES/DRESSINGS) ×2 IMPLANT
SUT MNCRL AB 4-0 PS2 18 (SUTURE) ×2 IMPLANT
SUT SILK 2-0 18XBRD TIE 12 (SUTURE) IMPLANT
SUT SILK 3-0 18XBRD TIE 12 (SUTURE) IMPLANT
SUT VIC AB 3-0 SH 18 (SUTURE) ×2 IMPLANT
SYR BULB EAR ULCER 3OZ GRN STR (SYRINGE) ×2 IMPLANT
SYR CONTROL 10ML LL (SYRINGE) ×2 IMPLANT
TOWEL GREEN STERILE (TOWEL DISPOSABLE) ×2 IMPLANT
TOWEL GREEN STERILE FF (TOWEL DISPOSABLE) ×2 IMPLANT
TUBE CONNECTING 12X1/4 (SUCTIONS) ×2 IMPLANT

## 2023-02-09 NOTE — Anesthesia Procedure Notes (Signed)
 Procedure Name: Intubation Date/Time: 02/09/2023 7:41 AM  Performed by: Lockie Flesher, CRNAPre-anesthesia Checklist: Patient identified, Emergency Drugs available, Suction available and Patient being monitored Patient Re-evaluated:Patient Re-evaluated prior to induction Oxygen Delivery Method: Circle System Utilized Preoxygenation: Pre-oxygenation with 100% oxygen Induction Type: IV induction Ventilation: Mask ventilation without difficulty Laryngoscope Size: Mac and 3 Grade View: Grade I Tube type: Oral Tube size: 7.0 mm Number of attempts: 1 Airway Equipment and Method: Stylet and Oral airway Placement Confirmation: ETT inserted through vocal cords under direct vision, positive ETCO2 and breath sounds checked- equal and bilateral Secured at: 23 cm Tube secured with: Tape Dental Injury: Teeth and Oropharynx as per pre-operative assessment

## 2023-02-09 NOTE — Op Note (Signed)
 OPERATIVE REPORT - PARATHYROIDECTOMY  Preoperative diagnosis: Primary hyperparathyroidism  Postop diagnosis: Same  Procedure: Left minimally invasive parathyroidectomy  Surgeon:  [name], MD  Anesthesia: General endotracheal  Estimated blood loss: Minimal  Preparation: ChloraPrep  Indications: Patient is referred by Dr. Katheryn Messier from nephrology for surgical evaluation and management of hyperparathyroidism. Patient has laboratory studies which are somewhat confusing. She has had significant hypercalcemia with a high of 11.4 but a recent serum calcium  level in September which was low at 7.8. Phosphorus level has been elevated as high as 5.8. Intact PTH level is elevated as high as 2525. Calcium  times phosphorus product is elevated at 61. Patient has noted symptoms including fatigue and bone and joint pain. Has been on Sensipar 60 mg twice daily. This has apparently been well-tolerated. Underwent a nuclear medicine parathyroid  scan on October 12, 2022. This localized a left-sided parathyroid  adenoma, likely involving the inferior parathyroid  gland. Ultrasound confirmed a solitary left inferior enlarged gland. Patient presents today accompanied by her son. She is on hemodialysis. She dialyzes on Tuesdays Thursdays and Saturdays. Her access is in the right upper extremity.   Procedure: The patient was prepared in the pre-operative holding area. The patient was brought to the operating room and placed in a supine position on the operating room table. Following administration of general anesthesia, the patient was positioned and then prepped and draped in the usual strict aseptic fashion. After ascertaining that an adequate level of anesthesia been achieved, a neck incision was made with a #15 blade. Dissection was carried through subcutaneous tissues and platysma. Hemostasis was obtained with the electrocautery. Skin flaps were developed circumferentially and a Weitlander retractor was placed  for exposure.  Strap muscles were incised in the midline. Strap muscles were reflected laterally exposing the thyroid  lobe. With gentle blunt dissection the thyroid  lobe was mobilized.  Dissection was carried posteriorly and an enlarged parathyroid  gland was identified at the inferolateral aspect of the left thyroid  lobe. It was gently mobilized. Vascular structures were divided between small ligaclips. Care was taken to avoid the recurrent laryngeal nerve. The parathyroid  gland was completely excised. It was submitted to pathology where frozen section confirmed hypercellular parathyroid  tissue.  Further dissection on the left side failed to reveal any evidence of an enlarged left superior gland.  Neck was irrigated with warm saline and good hemostasis was noted. Fibrillar was placed in the operative field. Strap muscles were approximated in the midline with interrupted 3-0 Vicryl sutures. Platysma was closed with interrupted 3-0 Vicryl sutures. Marcaine  was infiltrated circumferentially. Skin was closed with a running 4-0 Monocryl subcuticular suture. Wound was washed and dried and Dermabond was applied. Patient was awakened from anesthesia and brought to the recovery room. The patient tolerated the procedure well.   [name], MD [facility] Office: [phone]

## 2023-02-09 NOTE — Transfer of Care (Signed)
 Immediate Anesthesia Transfer of Care Note  Patient: Sara Davis  Procedure(s) Performed: LEFT INFERIOR PARATHYROIDECTOMY (Left: Neck)  Patient Location: PACU  Anesthesia Type:General  Level of Consciousness: awake, alert , and oriented  Airway & Oxygen Therapy: Patient Spontanous Breathing and Patient connected to nasal cannula oxygen  Post-op Assessment: Report given to RN and Post -op Vital signs reviewed and stable  Post vital signs: Reviewed and stable  Last Vitals:  Vitals Value Taken Time  BP 193/71 02/09/23 0836  Temp    Pulse 71 02/09/23 0839  Resp 19 02/09/23 0839  SpO2 97 % 02/09/23 0839  Vitals shown include unfiled device data.  Last Pain:  Vitals:   02/09/23 9367  TempSrc:   PainSc: 8       Patients Stated Pain Goal: 1 (02/09/23 9367)  Complications: No notable events documented.

## 2023-02-09 NOTE — Interval H&P Note (Signed)
 History and Physical Interval Note:  02/09/2023 7:03 AM  Sara Davis  has presented today for surgery, with the diagnosis of HYPERPARATHYROIDISM.  The various methods of treatment have been discussed with the patient and family. After consideration of risks, benefits and other options for treatment, the patient has consented to    Procedure(s): LEFT INFERIOR PARATHYROIDECTOMY (Left) as a surgical intervention.    The patient's history has been reviewed, patient examined, no change in status, stable for surgery.  I have reviewed the patient's chart and labs.  Questions were answered to the patient's satisfaction.    [name], MD [facility] practice Office: [phone]   [name]

## 2023-02-09 NOTE — Anesthesia Postprocedure Evaluation (Signed)
 Anesthesia Post Note  Patient: AKYIA BORELLI  Procedure(s) Performed: LEFT INFERIOR PARATHYROIDECTOMY (Left: Neck)     Patient location during evaluation: PACU Anesthesia Type: General Level of consciousness: awake and alert, patient cooperative and oriented Pain management: pain level controlled Vital Signs Assessment: post-procedure vital signs reviewed and stable Respiratory status: spontaneous breathing, nonlabored ventilation, respiratory function stable and patient connected to nasal cannula oxygen Cardiovascular status: blood pressure returned to baseline and stable Postop Assessment: no apparent nausea or vomiting Anesthetic complications: no   No notable events documented.  Last Vitals:  Vitals:   02/09/23 1001 02/09/23 1159  BP: (!) 158/64 (!) 151/58  Pulse: 73 74  Resp: 18 18  Temp: 36.7 C 36.7 C  SpO2: 100% 95%    Last Pain:  Vitals:   02/09/23 1001  TempSrc: Oral  PainSc:                  Duaine Radin,E. Treyvone Chelf

## 2023-02-09 NOTE — Consult Note (Addendum)
  [facility] Renal Consultation Note    Indication for Consultation:  Management of ESRD/hemodialysis, anemia, hypertension/volume, and secondary hyperparathyroidism.  HPI: Sara Davis is a 73 y.o. female with PMH of ESRD on hemodialysis, parathyroid  adenoma, secondary hyperparathyroidism, HTN, and anemia was seen today s/p left inferior parathyroidectomy on 1/7.   Patient reports feeling great after her procedure and states she has no pain. She reports her bone pain has also improved. She denies chest pain, dyspnea, nausea, or vomiting.   Patient dialyzes TTS at [facility] but received dialysis yesterday in preparation for her surgery today. Will resume outpatient dialysis schedule Thursday pending discharge.   Past Medical History:  Diagnosis Date   Anemia    Cellulitis of scalp 02/2015   Chronic kidney disease 01/20/2021   stage V   Depression    Diabetes mellitus without complication (HCC)    History of blood transfusion 11/2022   Hyperlipidemia    Hyperparathyroidism (HCC)    Hypertension    Past Surgical History:  Procedure Laterality Date   ABDOMINAL HYSTERECTOMY     AV FISTULA PLACEMENT Right 02/05/2021   Procedure: INSERTION OF RIGHT ARM ARTERIOVENOUS (AV) GORE-TEX GRAFT;  Surgeon: Magda Debby SAILOR, MD;  Location: [facility] OR;  Service: Vascular;  Laterality: Right;   BIOPSY  01/07/2018   Procedure: BIOPSY;  Surgeon: Abran Norleen SAILOR, MD;  Location: [facility] ENDOSCOPY;  Service: Endoscopy;;  Clotest   COLONOSCOPY WITH PROPOFOL  N/A 01/07/2018   Procedure: COLONOSCOPY WITH PROPOFOL ;  Surgeon: Abran Norleen SAILOR, MD;  Location: [facility] ENDOSCOPY;  Service: Endoscopy;  Laterality: N/A;   ENTEROSCOPY N/A 06/17/2022   Procedure: ENTEROSCOPY;  Surgeon: Legrand Victory LITTIE DOUGLAS, MD;  Location: [facility] ENDOSCOPY;  Service: Gastroenterology;  Laterality: N/A;   ENTEROSCOPY N/A 10/20/2022   Procedure: ENTEROSCOPY;  Surgeon: Stacia Glendia BRAVO, MD;  Location: [facility] ENDOSCOPY;  Service: Gastroenterology;   Laterality: N/A;   ESOPHAGOGASTRODUODENOSCOPY (EGD) WITH PROPOFOL  N/A 01/07/2018   Procedure: ESOPHAGOGASTRODUODENOSCOPY (EGD) WITH PROPOFOL ;  Surgeon: Abran Norleen SAILOR, MD;  Location[facility] ENDOSCOPY;  Service: Endoscopy;  Laterality: N/A;   ESOPHAGOGASTRODUODENOSCOPY (EGD) WITH PROPOFOL  N/A 12/14/2018   Procedure: ESOPHAGOGASTRODUODENOSCOPY (EGD) WITH PROPOFOL ;  Surgeon: Abran Norleen SAILOR, MD;  Location: [facility] ENDOSCOPY;  Service: Endoscopy;  Laterality: N/A;   GIVENS CAPSULE STUDY N/A 10/18/2022   Procedure: GIVENS CAPSULE STUDY;  Surgeon: Shila Gustav GAILS, MD;  Location: [facility] ENDOSCOPY;  Service: Gastroenterology;  Laterality: N/A;  Per Mansouraty   HEMOSTASIS CLIP PLACEMENT  06/17/2022   Procedure: HEMOSTASIS CLIP PLACEMENT;  Surgeon: Legrand Victory LITTIE DOUGLAS, MD;  Location: [facility] ENDOSCOPY;  Service: Gastroenterology;;   HEMOSTASIS CONTROL  12/14/2018   Procedure: HEMOSTASIS CONTROL;  Surgeon: Abran Norleen SAILOR, MD;  Location: [facility] ENDOSCOPY;  Service: Endoscopy;;   HOT HEMOSTASIS N/A 06/17/2022   Procedure: HOT HEMOSTASIS (ARGON PLASMA COAGULATION/BICAP);  Surgeon: Legrand Victory LITTIE DOUGLAS, MD;  Location: [facility] ENDOSCOPY;  Service: Gastroenterology;  Laterality: N/A;   HOT HEMOSTASIS N/A 10/20/2022   Procedure: HOT HEMOSTASIS (ARGON PLASMA COAGULATION/BICAP);  Surgeon: Stacia Glendia BRAVO, MD;  Location: [facility] ENDOSCOPY;  Service: Gastroenterology;  Laterality: N/A;   POLYPECTOMY  01/07/2018   Procedure: POLYPECTOMY;  Surgeon: Abran Norleen SAILOR, MD;  Location: [facility] ENDOSCOPY;  Service: Endoscopy;;   POLYPECTOMY  10/20/2022   Procedure: POLYPECTOMY;  Surgeon: Stacia Glendia BRAVO, MD;  Location: [facility] ENDOSCOPY;  Service: Gastroenterology;;   MATIAS  06/17/2022   Procedure: MATIAS;  Surgeon: Legrand Victory LITTIE DOUGLAS, MD;  Location: [facility] ENDOSCOPY;  Service: Gastroenterology;;   Family History  Problem Relation Age of Onset   Diabetes Mother    Cancer Mother    Diabetes Sister    Hypertension Sister    Cancer Sister 79       Bone    Diabetes Brother    Diabetes Sister    Diabetes Sister    Social History:  reports that she has been smoking cigarettes. She has never used smokeless tobacco. She reports that she does not drink alcohol and does not use drugs.  ROS: As per HPI otherwise negative.  Physical Exam: Vitals:   02/09/23 0915 02/09/23 0917 02/09/23 1001 02/09/23 1159  BP: (!) 178/67 (!) 183/63 (!) 158/64 (!) 151/58  Pulse: 76 73 73 74  Resp: 20 13 18 18   Temp:  98.4 F (36.9 C) 98 F (36.7 C) 98 F (36.7 C)  TempSrc:   Oral   SpO2: 99% 99% 100% 95%  Weight:      Height:         General: Well developed, well nourished, in no acute distress. Head: Normocephalic, atraumatic Neck: horizontal neck incision noted without signs of infection Lungs: Clear bilaterally to auscultation without wheezes, rales, or rhonchi. Breathing is unlabored. Heart: RRR with normal S1, S2. No murmurs, rubs, or gallops appreciated. Abdomen: Soft, non-tender, non-distended with normoactive bowel sounds. No rebound/guarding. No obvious abdominal masses. Musculoskeletal:  Strength and tone appear normal for age. Lower extremities: No edema or ischemic changes, no open wounds. Neuro: Alert and oriented X 3. Moves all extremities spontaneously. Psych:  Responds to questions appropriately with a normal affect. Dialysis Access: right upper arm AVG +thrill / +bruit  Allergies  Allergen Reactions   Aspirin  Other (See Comments)    Upset stomach, can only take coated aspirin  but prefers to not take it at all   Prior to Admission medications   Medication Sig Start Date End Date Taking? Authorizing Provider  atorvastatin  (LIPITOR ) 80 MG tablet Take 1 tablet (80 mg total) by mouth daily. 12/20/18  Yes Luke Vernell BRAVO, MD  B Complex-C-Folic Acid  (DIALYVITE TABLET) TABS Take 1 tablet by mouth daily. 03/05/22  Yes [provider]  carvedilol  (COREG ) 25 MG tablet Take 1 tablet (25 mg total) by mouth 2 (two) times daily with a meal.  05/09/19  Yes Ebb Lauraine Flatness, NP  cinacalcet (SENSIPAR) 60 MG tablet Take 120 mg by mouth daily.   Yes [provider]  albuterol  (VENTOLIN  HFA) 108 (90 Base) MCG/ACT inhaler Inhale 1-2 puffs into the lungs every 6 (six) hours as needed for wheezing or shortness of breath. Patient not taking: Reported on 02/09/2023 10/27/22   [provider]  AURYXIA  1 GM 210 MG(Fe) tablet Take 420 mg by mouth 3 (three) times daily. Patient not taking: Reported on 02/09/2023 04/12/22   [provider]  capsaicin  (ZOSTRIX) 0.025 % cream Apply topically 3 (three) times daily as needed. Apply a small amount to knees three times daily as needed Patient not taking: Reported on 02/09/2023 10/20/22   Cornelius Dines, MD  folic acid  (FOLVITE ) 1 MG tablet Take 1 tablet (1 mg total) by mouth daily. Patient not taking: Reported on 02/09/2023 10/20/22   Cornelius Dines, MD   Current Facility-Administered Medications  Medication Dose Route Frequency Provider Last Rate Last Admin   acetaminophen  (TYLENOL ) tablet 650 mg  650 mg Oral Q6H PRN Eletha Boas, MD       Or   acetaminophen  (TYLENOL ) suppository 650 mg  650 mg Rectal Q6H PRN Eletha Boas, MD  carvedilol  (COREG ) tablet 25 mg  25 mg Oral BID WC Eletha Boas, MD       dextrose  5 %-0.9 % sodium chloride  infusion   Intravenous Continuous Eletha Boas, MD       HYDROmorphone  (DILAUDID ) injection 1 mg  1 mg Intravenous Q2H PRN Eletha Boas, MD       ondansetron  (ZOFRAN -ODT) disintegrating tablet 4 mg  4 mg Oral Q6H PRN Eletha Boas, MD       Or   ondansetron  (ZOFRAN ) injection 4 mg  4 mg Intravenous Q6H PRN Eletha Boas, MD       oxyCODONE  (Oxy IR/ROXICODONE ) immediate release tablet 5-10 mg  5-10 mg Oral Q4H PRN Eletha Boas, MD       traMADol  (ULTRAM ) tablet 50 mg  50 mg Oral Q6H PRN Eletha Boas, MD       Labs: Basic Metabolic Panel: Recent Labs  Lab 02/09/23 0625 02/09/23 1520  NA 141 138  K 4.0 5.9*  CL 103 101  CO2  --  21*  GLUCOSE 98  229*  BUN 26* 34*  CREATININE 4.70* 5.02*  CALCIUM   --  7.7*  PHOS  --  4.6   CBC: Recent Labs  Lab 02/09/23 0625  HGB 12.6  HCT 37.0    CBG: Recent Labs  Lab 02/05/23 0815 02/09/23 0606 02/09/23 0841  GLUCAP 142* 93 101*    Dialysis Orders: TTS at [facility]. 3:45, 400/700, EDW 51.5kg, 3K/2.5Ca bath, AVG, no heparin .  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 Was on sensipar 180 mg daily which was held. Ca 7.7, will order calcitriol  2g daily. Phos 4.6, hold binders for now as this may go down further.  Hyperkalemia: Fully dialyzed yesterday. Will give a dose of Lokelma  now and repeat labs in the morning. If remains high will plan to dialyze tomorrow.   ESRD: Continue TTS outpatient. Was dialyzed Monday this week in preparation for the procedure today but will return to normal TTS schedule - next treatment Thursday 1/9 (unless hyperK see above).   Hypertension/volume: BP slightly elevated. No LE edema / signs of overload. No dyspnea.   Anemia: Hb 12.6. No ESA needed.  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  Signe Sick, PA-S Izetta Boehringer, PA-C 02/09/2023, 4:03 PM  [facility]

## 2023-02-10 ENCOUNTER — Encounter (HOSPITAL_COMMUNITY): Payer: Self-pay | Admitting: Surgery

## 2023-02-10 LAB — RENAL FUNCTION PANEL
Albumin: 3.2 g/dL — ABNORMAL LOW (ref 3.5–5.0)
Anion gap: 18 — ABNORMAL HIGH (ref 5–15)
BUN: 50 mg/dL — ABNORMAL HIGH (ref 8–23)
CO2: 22 mmol/L (ref 22–32)
Calcium: 6.4 mg/dL — CL (ref 8.9–10.3)
Chloride: 99 mmol/L (ref 98–111)
Creatinine, Ser: 6.4 mg/dL — ABNORMAL HIGH (ref 0.44–1.00)
GFR, Estimated: 6 mL/min — ABNORMAL LOW (ref >60)
Glucose, Bld: 117 mg/dL — ABNORMAL HIGH (ref 70–99)
Phosphorus: 3.2 mg/dL (ref 2.5–4.6)
Potassium: 4.7 mmol/L (ref 3.5–5.1)
Sodium: 139 mmol/L (ref 135–145)

## 2023-02-10 LAB — CALCIUM
Calcium: 6.3 mg/dL — CL (ref 8.9–10.3)
Calcium: 6.7 mg/dL — ABNORMAL LOW (ref 8.9–10.3)
Calcium: 7.1 mg/dL — ABNORMAL LOW (ref 8.9–10.3)

## 2023-02-10 LAB — PARATHYROID HORMONE, INTACT (NO CA): PTH: 126 pg/mL — ABNORMAL HIGH (ref 15–65)

## 2023-02-10 LAB — SURGICAL PATHOLOGY

## 2023-02-10 MED ORDER — SODIUM CHLORIDE 0.9 % IV SOLN
4.0000 g | Freq: Once | INTRAVENOUS | Status: AC
  Administered 2023-02-10: 4 g via INTRAVENOUS
  Filled 2023-02-10: qty 40

## 2023-02-10 MED ORDER — CALCIUM CARBONATE 1250 (500 CA) MG PO TABS
2.0000 | ORAL_TABLET | Freq: Three times a day (TID) | ORAL | Status: DC
  Administered 2023-02-10 (×2): 2500 mg via ORAL
  Filled 2023-02-10 (×4): qty 2

## 2023-02-10 MED ORDER — CALCITRIOL 0.5 MCG PO CAPS
2.0000 ug | ORAL_CAPSULE | Freq: Two times a day (BID) | ORAL | Status: DC
  Administered 2023-02-10 – 2023-02-14 (×8): 2 ug via ORAL
  Filled 2023-02-10 (×8): qty 4

## 2023-02-10 MED ORDER — CHLORHEXIDINE GLUCONATE CLOTH 2 % EX PADS
6.0000 | MEDICATED_PAD | Freq: Every day | CUTANEOUS | Status: DC
  Administered 2023-02-11 – 2023-02-14 (×3): 6 via TOPICAL

## 2023-02-10 NOTE — Discharge Instructions (Signed)

## 2023-02-10 NOTE — Plan of Care (Signed)
 Patient alert/oriented  X4. Patient compliant with medication administration and oxycodone   administered as needed for chronic L shoulder pain. Patient ambulated independently in room using a front wheel walker. Patient tolerated IV calcium  gluconate. VSS, will continue to monitor.  Problem: Education: Goal: Knowledge of General Education information will improve Description: Including pain rating scale, medication(s)/side effects and non-pharmacologic comfort measures Outcome: Progressing   Problem: Health Behavior/Discharge Planning: Goal: Ability to manage health-related needs will improve Outcome: Progressing   Problem: Clinical Measurements: Goal: Ability to maintain clinical measurements within normal limits will improve Outcome: Progressing   Problem: Clinical Measurements: Goal: Will remain free from infection Outcome: Progressing   Problem: Clinical Measurements: Goal: Diagnostic test results will improve Outcome: Progressing   Problem: Clinical Measurements: Goal: Respiratory complications will improve Outcome: Progressing   Problem: Clinical Measurements: Goal: Cardiovascular complication will be avoided Outcome: Progressing   Problem: Activity: Goal: Risk for activity intolerance will decrease Outcome: Progressing   Problem: Nutrition: Goal: Adequate nutrition will be maintained Outcome: Progressing   Problem: Coping: Goal: Level of anxiety will decrease Outcome: Progressing   Problem: Elimination: Goal: Will not experience complications related to bowel motility Outcome: Progressing   Problem: Elimination: Goal: Will not experience complications related to urinary retention Outcome: Progressing   Problem: Pain Management: Goal: General experience of comfort will improve Outcome: Progressing   Problem: Safety: Goal: Ability to remain free from injury will improve Outcome: Progressing   Problem: Skin Integrity: Goal: Risk for impaired skin  integrity will decrease Outcome: Progressing

## 2023-02-10 NOTE — Progress Notes (Signed)
 Pathology consistent with a parathyroid adenoma, as expected.  Darnell Level, MD [facility] practice Office: [phone]

## 2023-02-10 NOTE — Progress Notes (Signed)
   02/10/23 0828  Mobility  Activity Refused mobility  Mobility Specialist Start Time (ACUTE ONLY) 0826  Mobility Specialist Stop Time (ACUTE ONLY) 0828  Mobility Specialist Time Calculation (min) (ACUTE ONLY) 2 min   Mobility Specialist: Progress Note  Pt refused mobility session d/t I have been getting up and moving around, I just want them to release me so I can go home.  - Received and left in bed with all needs met. Call bell within reach. MS will follow up as time permits.    Virgle Boards, BS Mobility Specialist Please contact via SecureChat or Rehab office at [phone].

## 2023-02-10 NOTE — Plan of Care (Signed)

## 2023-02-10 NOTE — Progress Notes (Addendum)
  [facility] Progress Note   Subjective:   Patient seen in room - sitting on edge of bed comfortably. Patient is in good spirits and reports feeling great. Denies chest pain/SOB/N/V. Denies any bone pain and states she is ambulating well. Patient is eager to return home but is being held due to low calcium .    Objective Vitals:   02/09/23 2139 02/10/23 0019 02/10/23 0438 02/10/23 0820  BP: (!) 150/55 (!) 136/54 (!) 129/55 (!) 124/49  Pulse: 78 78 77 76  Resp: 18 17 18 18   Temp: 98 F (36.7 C) 98.2 F (36.8 C) 98.3 F (36.8 C) 98 F (36.7 C)  TempSrc: Oral Oral Oral   SpO2: 98% 99% 98% 98%  Weight:      Height:       Physical Exam General: Well nourished elderly woman in NAD. Sitting on edge of bed.  Heart:RRR, no M/R/G Lungs: CTAB  Abdomen: non-distended, non-tender Extremities: no LE edema Dialysis Access: RUE AVG +thrill/+bruit   Additional Objective Labs: Basic Metabolic Panel: Recent Labs  Lab 02/09/23 0625 02/09/23 1520 02/10/23 0512 02/10/23 0513  NA 141 138  --  139  K 4.0 5.9*  --  4.7  CL 103 101  --  99  CO2  --  21*  --  22  GLUCOSE 98 229*  --  117*  BUN 26* 34*  --  50*  CREATININE 4.70* 5.02*  --  6.40*  CALCIUM   --  7.7* 6.3* 6.4*  PHOS  --  4.6  --  3.2   Liver Function Tests: Recent Labs  Lab 02/09/23 1520 02/10/23 0513  ALBUMIN  3.5 3.2*   CBC: Recent Labs  Lab 02/09/23 0625  HGB 12.6  HCT 37.0   CBG: Recent Labs  Lab 02/05/23 0815 02/09/23 0606 02/09/23 0841  GLUCAP 142* 93 101*    Medications:  calcium  gluconate 4 g (02/10/23 0944)   dextrose  5 % and 0.9 % NaCl      calcitRIOL   2 mcg Oral Daily   carvedilol   25 mg Oral BID WC    Dialysis Orders: TTS at [facility]. 3:45, 400/700, EDW 51.5kg, 3K/2.5Ca bath, AVG, no heparin .   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 Cont. holding sensipar 180 mg. Ca 6.4, continue calcitriol   2g daily + IV bolus 4g calcium  gluconate now. Will reassess after IV bolus - time collection for noon today ordered. Phos 3.2, continue to hold binders for now. Hypocalcemia: Ca 6.4, see above.  Hyperkalemia: Given 1 dose Lokelma  1/7, better today.  ESRD: Continue TTS schedule. Was dialyzed Monday this week in preparation for the procedure but will return to normal TTS schedule - next treatment Thursday 1/9.  Hypertension/volume: BP controlled. No LE edema/DOE.  Anemia: Hb 12.6. No ESA needed.  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  Addendum: Ca 6.7 at 1130. Given another 4g IV calcium , added calcium  carbonate 2,500 mg TID between meals, and increased dose calcitriol  2 mcg BID.   Signe Sick, PA-S Izetta Boehringer, PA-C 02/10/2023, 9:59 AM  [facility]

## 2023-02-10 NOTE — Progress Notes (Signed)
 Assessment & Plan: POD#1 - status post left inferior parathyroidectomy  Calcium  6.4 mg/dl this AM - asymptomatic  K is 4.7  Minimal discomfort - wants to go home today  Await evaluation by nephrology this AM - ?dialysis today or tomorrow.  Will ask nephrology to manage calcium  supplementation.  Will discharge home when cleared by nephrology.        [name], MD [facility] practice Office: [phone]        Chief Complaint: Hyperparathyroidism  Subjective: Patient in bed, states she feels better.  Minimal discomfort.  Wants to go home.  Objective: Vital signs in last 24 hours: Temp:  [97.6 F (36.4 C)-98.4 F (36.9 C)] 98.3 F (36.8 C) (01/08 0438) Pulse Rate:  [71-80] 77 (01/08 0438) Resp:  [13-20] 18 (01/08 0438) BP: (129-193)/(54-71) 129/55 (01/08 0438) SpO2:  [91 %-100 %] 98 % (01/08 0438) Last BM Date : 02/09/23  Intake/Output from previous day: 01/07 0701 - 01/08 0700 In: 650 [I.V.:300; IV Piggyback:350] Out: 10 [Blood:10] Intake/Output this shift: No intake/output data recorded.  Physical Exam: HEENT - sclerae clear, mucous membranes moist Neck - wound dry and intact with Dermabond; voice normal   Lab Results:  Recent Labs    02/09/23 0625  HGB 12.6  HCT 37.0   BMET Recent Labs    02/09/23 1520 02/10/23 0512 02/10/23 0513  NA 138  --  139  K 5.9*  --  4.7  CL 101  --  99  CO2 21*  --  22  GLUCOSE 229*  --  117*  BUN 34*  --  50*  CREATININE 5.02*  --  6.40*  CALCIUM  7.7* 6.3* 6.4*   PT/INR No results for input(s): LABPROT, INR in the last 72 hours. Comprehensive Metabolic Panel:    Component Value Date/Time   NA 139 02/10/2023 0513   NA 138 02/09/2023 1520   NA 142 05/09/2019 1139   NA 143 12/20/2018 1147   K 4.7 02/10/2023 0513   K 5.9 (H) 02/09/2023 1520   CL 99 02/10/2023 0513   CL 101 02/09/2023 1520   CO2 22 02/10/2023 0513   CO2 21 (L) 02/09/2023 1520   BUN 50 (H) 02/10/2023 0513    BUN 34 (H) 02/09/2023 1520   BUN 23 05/09/2019 1139   BUN 32 (H) 12/20/2018 1147   CREATININE 6.40 (H) 02/10/2023 0513   CREATININE 5.02 (H) 02/09/2023 1520   CREATININE 5.13 (H) 01/11/2023 0920   CREATININE 3.98 (H) 12/18/2022 1133   CREATININE 0.75 07/31/2013 1059   CREATININE 0.66 07/22/2012 0855   GLUCOSE 117 (H) 02/10/2023 0513   GLUCOSE 229 (H) 02/09/2023 1520   CALCIUM  6.4 (LL) 02/10/2023 0513   CALCIUM  6.3 (LL) 02/10/2023 0512   AST 12 (L) 01/11/2023 0920   AST 11 (L) 12/18/2022 1133   ALT <5 01/11/2023 0920   ALT 5 12/18/2022 1133   ALKPHOS 913 (H) 01/11/2023 0920   ALKPHOS 758 (H) 12/18/2022 1133   BILITOT 0.4 01/11/2023 0920   BILITOT 0.4 12/18/2022 1133   PROT 7.1 01/11/2023 0920   PROT 6.5 12/18/2022 1133   PROT 6.3 05/09/2019 1139   PROT 5.6 (L) 01/05/2018 1206   ALBUMIN  3.2 (L) 02/10/2023 0513   ALBUMIN  3.5 02/09/2023 1520   ALBUMIN  4.5 05/09/2019 1139   ALBUMIN  3.6 01/05/2018 1206    Studies/Results: No results found.    [name] 02/10/2023  Patient ID: Sara Davis, female   DOB: [birth year], 73  y.o.   MRN: [id number]

## 2023-02-10 NOTE — Care Management Obs Status (Signed)
 MEDICARE OBSERVATION STATUS NOTIFICATION   Patient Details  Name: FELINA TELLO MRN: [id number] Date of Birth: [birth year]   Medicare Observation Status Notification Given:  Yes    Harriet Masson, RN 02/10/2023, 10:16 AM

## 2023-02-11 LAB — CBC WITH DIFFERENTIAL/PLATELET
Abs Immature Granulocytes: 0.01 10*3/uL (ref 0.00–0.07)
Basophils Absolute: 0 10*3/uL (ref 0.0–0.1)
Basophils Relative: 0 %
Eosinophils Absolute: 0.1 10*3/uL (ref 0.0–0.5)
Eosinophils Relative: 1 %
HCT: 33.3 % — ABNORMAL LOW (ref 36.0–46.0)
Hemoglobin: 10.4 g/dL — ABNORMAL LOW (ref 12.0–15.0)
Immature Granulocytes: 0 %
Lymphocytes Relative: 24 %
Lymphs Abs: 1.3 10*3/uL (ref 0.7–4.0)
MCH: 28.7 pg (ref 26.0–34.0)
MCHC: 31.2 g/dL (ref 30.0–36.0)
MCV: 92 fL (ref 80.0–100.0)
Monocytes Absolute: 0.5 10*3/uL (ref 0.1–1.0)
Monocytes Relative: 10 %
Neutro Abs: 3.5 10*3/uL (ref 1.7–7.7)
Neutrophils Relative %: 65 %
Platelets: 207 10*3/uL (ref 150–400)
RBC: 3.62 MIL/uL — ABNORMAL LOW (ref 3.87–5.11)
RDW: 15.9 % — ABNORMAL HIGH (ref 11.5–15.5)
WBC: 5.3 10*3/uL (ref 4.0–10.5)
nRBC: 0 % (ref 0.0–0.2)

## 2023-02-11 LAB — RENAL FUNCTION PANEL
Albumin: 3.1 g/dL — ABNORMAL LOW (ref 3.5–5.0)
Albumin: 3.1 g/dL — ABNORMAL LOW (ref 3.5–5.0)
Anion gap: 16 — ABNORMAL HIGH (ref 5–15)
Anion gap: 13 (ref 5–15)
BUN: 51 mg/dL — ABNORMAL HIGH (ref 8–23)
BUN: 15 mg/dL (ref 8–23)
CO2: 20 mmol/L — ABNORMAL LOW (ref 22–32)
CO2: 28 mmol/L (ref 22–32)
Calcium: 6.3 mg/dL — CL (ref 8.9–10.3)
Calcium: 7 mg/dL — ABNORMAL LOW (ref 8.9–10.3)
Chloride: 103 mmol/L (ref 98–111)
Chloride: 94 mmol/L — ABNORMAL LOW (ref 98–111)
Creatinine, Ser: 7.61 mg/dL — ABNORMAL HIGH (ref 0.44–1.00)
Creatinine, Ser: 3.6 mg/dL — ABNORMAL HIGH (ref 0.44–1.00)
GFR, Estimated: 5 mL/min — ABNORMAL LOW (ref >60)
GFR, Estimated: 13 mL/min — ABNORMAL LOW (ref >60)
Glucose, Bld: 104 mg/dL — ABNORMAL HIGH (ref 70–99)
Glucose, Bld: 121 mg/dL — ABNORMAL HIGH (ref 70–99)
Phosphorus: 3 mg/dL (ref 2.5–4.6)
Phosphorus: 2 mg/dL — ABNORMAL LOW (ref 2.5–4.6)
Potassium: 5.2 mmol/L — ABNORMAL HIGH (ref 3.5–5.1)
Potassium: 3.9 mmol/L (ref 3.5–5.1)
Sodium: 139 mmol/L (ref 135–145)
Sodium: 135 mmol/L (ref 135–145)

## 2023-02-11 LAB — CBC
HCT: 35.3 % — ABNORMAL LOW (ref 36.0–46.0)
Hemoglobin: 10.7 g/dL — ABNORMAL LOW (ref 12.0–15.0)
MCH: 28.3 pg (ref 26.0–34.0)
MCHC: 30.3 g/dL (ref 30.0–36.0)
MCV: 93.4 fL (ref 80.0–100.0)
Platelets: 203 10*3/uL (ref 150–400)
RBC: 3.78 MIL/uL — ABNORMAL LOW (ref 3.87–5.11)
RDW: 16.3 % — ABNORMAL HIGH (ref 11.5–15.5)
WBC: 7.1 10*3/uL (ref 4.0–10.5)
nRBC: 0 % (ref 0.0–0.2)

## 2023-02-11 LAB — HEPATITIS PANEL, ACUTE
HCV Ab: NONREACTIVE
Hep A IgM: NONREACTIVE
Hep B C IgM: NONREACTIVE
Hepatitis B Surface Ag: NONREACTIVE

## 2023-02-11 LAB — CALCIUM: Calcium: 7.3 mg/dL — ABNORMAL LOW (ref 8.9–10.3)

## 2023-02-11 LAB — HEPATITIS B SURFACE ANTIGEN: Hepatitis B Surface Ag: NONREACTIVE

## 2023-02-11 MED ORDER — CALCITRIOL 0.5 MCG PO CAPS: 2.0000 ug | ORAL_CAPSULE | Freq: Two times a day (BID) | ORAL | 6 refills | Status: AC

## 2023-02-11 MED ORDER — CALCIUM CARBONATE 1250 (500 CA) MG PO TABS
3.0000 | ORAL_TABLET | Freq: Three times a day (TID) | ORAL | Status: DC
  Administered 2023-02-11 – 2023-02-12 (×3): 3750 mg via ORAL
  Filled 2023-02-11 (×5): qty 3

## 2023-02-11 MED ORDER — CALCIUM GLUCONATE-NACL 2-0.675 GM/100ML-% IV SOLN
2.0000 g | Freq: Once | INTRAVENOUS | Status: AC
  Administered 2023-02-11: 2000 mg via INTRAVENOUS
  Filled 2023-02-11: qty 100

## 2023-02-11 MED ORDER — PENTAFLUOROPROP-TETRAFLUOROETH EX AERO: 1.0000 | INHALATION_SPRAY | CUTANEOUS | Status: DC | PRN

## 2023-02-11 MED ORDER — LIDOCAINE HCL (PF) 1 % IJ SOLN: 5.0000 mL | INTRAMUSCULAR | Status: DC | PRN

## 2023-02-11 MED ORDER — CALCIUM CARBONATE 1250 (500 CA) MG PO TABS: 3.0000 | ORAL_TABLET | Freq: Four times a day (QID) | ORAL | 6 refills | Status: DC

## 2023-02-11 MED ORDER — LIDOCAINE-PRILOCAINE 2.5-2.5 % EX CREA: 1.0000 | TOPICAL_CREAM | CUTANEOUS | Status: DC | PRN

## 2023-02-11 MED ORDER — CALCIUM CARBONATE 1250 (500 CA) MG PO TABS
2.0000 | ORAL_TABLET | Freq: Three times a day (TID) | ORAL | Status: DC
  Filled 2023-02-11 (×2): qty 2

## 2023-02-11 MED ORDER — CALCIUM CARBONATE 1250 (500 CA) MG PO TABS
2.0000 | ORAL_TABLET | Freq: Four times a day (QID) | ORAL | Status: DC
  Administered 2023-02-11: 2500 mg via ORAL
  Filled 2023-02-11 (×2): qty 2

## 2023-02-11 MED ORDER — CALCIUM CARBONATE 1250 (500 CA) MG PO TABS
2.0000 | ORAL_TABLET | Freq: Three times a day (TID) | ORAL | Status: DC
  Administered 2023-02-11: 2500 mg via ORAL
  Filled 2023-02-11 (×2): qty 2

## 2023-02-11 MED ORDER — HEPARIN SODIUM (PORCINE) 1000 UNIT/ML DIALYSIS: 1000.0000 [IU] | INTRAMUSCULAR | Status: DC | PRN

## 2023-02-11 MED ORDER — SODIUM CHLORIDE 0.9 % IV SOLN
4.0000 g | Freq: Once | INTRAVENOUS | Status: DC
  Filled 2023-02-11: qty 40

## 2023-02-11 MED ORDER — ANTICOAGULANT SODIUM CITRATE 4% (200MG/5ML) IV SOLN: 5.0000 mL | Status: DC | PRN

## 2023-02-11 MED ORDER — ALTEPLASE 2 MG IJ SOLR: 2.0000 mg | Freq: Once | INTRAMUSCULAR | Status: DC | PRN

## 2023-02-11 MED ORDER — CALCIUM GLUCONATE-NACL 2-0.675 GM/100ML-% IV SOLN
2.0000 g | Freq: Once | INTRAVENOUS | Status: AC
  Administered 2023-02-11: 2000 mg via INTRAVENOUS

## 2023-02-11 NOTE — Progress Notes (Signed)
 Went back to see patient to review medications for discharge -- she reported feeling weak and lightheaded. This is a change from this morning - was feeling well pre-HD. Called Dr. Eletha and we canceled her discharge. Will update vitals and labs, replace IV and keep her another day at least. Pt's son informed.  Izetta Boehringer, PA-C [facility] Pager [phone]

## 2023-02-11 NOTE — Progress Notes (Signed)
 Patient belongings packed up at bedside, PIV removed prior to discharge, and AVS instructions explained in detail to patient. Patient will be transported out by wheelchair when son arrives to main entrance.

## 2023-02-11 NOTE — Discharge Planning (Signed)
note in error

## 2023-02-11 NOTE — Progress Notes (Signed)
 Dialysis called about consent. Information passed on to night and day shift nurses [place] and [place]. Provider Tommi Emery PA. Order is for hemodialysis and procedure End Stage Renal Disease.

## 2023-02-11 NOTE — Progress Notes (Signed)
  [facility] Progress Note   Subjective:   Patient seen in HD - laying in bed comfortably. UFG 1-1.5L today as tolerated. S/p inferior parathyroidectomy 1/7. Patient in good spirits and continues to feel well post procedure. Denies CP/dyspnea/N. Patient admits to numbness in gums and hand tremor with movement overnight, likely secondary to low calcium .   Objective Vitals:   02/11/23 0845 02/11/23 0900 02/11/23 0930 02/11/23 1000  BP: (!) 152/77   (!) 173/64  Pulse: 77 74 73 73  Resp: 17 16 12 12   Temp:      TempSrc:      SpO2: 95% 95% 95% 96%  Weight:      Height:       Physical Exam General: Well nourished elderly woman in NAD. Laying comfortably in bed.  Heart: RRR, no M/R/G Lungs: CTAB  Abdomen: slightly distended, non-tender Extremities: no LE edema Dialysis Access: RUE AVG +thrill/+bruit   Additional Objective Labs: Basic Metabolic Panel: Recent Labs  Lab 02/09/23 1520 02/10/23 0512 02/10/23 0513 02/10/23 1130 02/10/23 1648 02/11/23 0424  NA 138  --  139  --   --  139  K 5.9*  --  4.7  --   --  5.2*  CL 101  --  99  --   --  103  CO2 21*  --  22  --   --  20*  GLUCOSE 229*  --  117*  --   --  104*  BUN 34*  --  50*  --   --  51*  CREATININE 5.02*  --  6.40*  --   --  7.61*  CALCIUM  7.7*   < > 6.4* 6.7* 7.1* 6.3*  PHOS 4.6  --  3.2  --   --  3.0   < > = values in this interval not displayed.   Liver Function Tests: Recent Labs  Lab 02/09/23 1520 02/10/23 0513 02/11/23 0424  ALBUMIN  3.5 3.2* 3.1*    CBC: Recent Labs  Lab 02/09/23 0625 02/11/23 0755  WBC  --  7.1  HGB 12.6 10.7*  HCT 37.0 35.3*  MCV  --  93.4  PLT  --  203   Recent Labs  Lab 02/05/23 0815 02/09/23 0606 02/09/23 0841  GLUCAP 142* 93 101*   Medications:  anticoagulant sodium citrate       calcitRIOL   2 mcg Oral BID   calcium  carbonate  2 tablet Oral TID BM   carvedilol   25 mg Oral BID WC   Chlorhexidine  Gluconate Cloth  6 each Topical Q0600    Dialysis  Orders: TTS at [facility] 3:45, 400/700, EDW 51.5kg, 3K/2.5Ca bath, AVG, no heparin .   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 Cont. holding sensipar 180 mg. Ca 6.3, continue calcitriol  2mcg BID + calcium  carbonate 2,500 mg TID + given another IV bolus 4g calcium  gluconate with HD this morning + given 3.0 Ca bath with HD. Will reassess after HD - time collection for 1300 today ordered. Phos 3.0 continue to hold binders for now. Hypocalcemia: Ca 6.3, see above.  Hyperkalemia: Slightly elevated, dialyzing today.  ESRD: Continue TTS schedule.  Hypertension/volume: BP elevated, UFG 1.5L today. No LE edema/DOE.  Anemia: Hb 10.7. No ESA needed for now. 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  Signe Sick, PA-S Izetta Boehringer, PA-C 02/11/2023, 10:12 AM  [employer]

## 2023-02-11 NOTE — Progress Notes (Signed)
 Assessment & Plan: POD#2 - status post left inferior parathyroidectomy             Calcium  6.1 mg/dl this AM - mildly symptomatic overnight?             HD now  Pathology consistent with parathyroid  adenoma  Home when stable from nephrology standpoint.        [name], MD [facility] practice Office: [phone]        Chief Complaint: Hyperparathyroidism    Subjective: Patient in HD  Objective: Vital signs in last 24 hours: Temp:  [98.3 F (36.8 C)-99.3 F (37.4 C)] 98.8 F (37.1 C) (01/09 1236) Pulse Rate:  [70-80] 77 (01/09 1236) Resp:  [9-18] 12 (01/09 1236) BP: (124-173)/(46-79) 160/65 (01/09 1236) SpO2:  [94 %-100 %] 94 % (01/09 1236) Weight:  [72.8 kg-74.3 kg] 72.8 kg (01/09 1236) Last BM Date : 02/11/23  Intake/Output from previous day: No intake/output data recorded. Intake/Output this shift: Total I/O In: -  Out: 1500 [Other:1500]  Physical Exam: deferred  Lab Results:  Recent Labs    02/09/23 0625 02/11/23 0755  WBC  --  7.1  HGB 12.6 10.7*  HCT 37.0 35.3*  PLT  --  203   BMET Recent Labs    02/10/23 0513 02/10/23 1130 02/10/23 1648 02/11/23 0424  NA 139  --   --  139  K 4.7  --   --  5.2*  CL 99  --   --  103  CO2 22  --   --  20*  GLUCOSE 117*  --   --  104*  BUN 50*  --   --  51*  CREATININE 6.40*  --   --  7.61*  CALCIUM  6.4*   < > 7.1* 6.3*   < > = values in this interval not displayed.   PT/INR No results for input(s): LABPROT, INR in the last 72 hours. Comprehensive Metabolic Panel:    Component Value Date/Time   NA 139 02/11/2023 0424   NA 139 02/10/2023 0513   NA 142 05/09/2019 1139   NA 143 12/20/2018 1147   K 5.2 (H) 02/11/2023 0424   K 4.7 02/10/2023 0513   CL 103 02/11/2023 0424   CL 99 02/10/2023 0513   CO2 20 (L) 02/11/2023 0424   CO2 22 02/10/2023 0513   BUN 51 (H) 02/11/2023 0424   BUN 50 (H) 02/10/2023 0513   BUN 23 05/09/2019 1139   BUN 32 (H) 12/20/2018 1147    CREATININE 7.61 (H) 02/11/2023 0424   CREATININE 6.40 (H) 02/10/2023 0513   CREATININE 5.13 (H) 01/11/2023 0920   CREATININE 3.98 (H) 12/18/2022 1133   CREATININE 0.75 07/31/2013 1059   CREATININE 0.66 07/22/2012 0855   GLUCOSE 104 (H) 02/11/2023 0424   GLUCOSE 117 (H) 02/10/2023 0513   CALCIUM  6.3 (LL) 02/11/2023 0424   CALCIUM  7.1 (L) 02/10/2023 1648   AST 12 (L) 01/11/2023 0920   AST 11 (L) 12/18/2022 1133   ALT <5 01/11/2023 0920   ALT 5 12/18/2022 1133   ALKPHOS 913 (H) 01/11/2023 0920   ALKPHOS 758 (H) 12/18/2022 1133   BILITOT 0.4 01/11/2023 0920   BILITOT 0.4 12/18/2022 1133   PROT 7.1 01/11/2023 0920   PROT 6.5 12/18/2022 1133   PROT 6.3 05/09/2019 1139   PROT 5.6 (L) 01/05/2018 1206   ALBUMIN  3.1 (L) 02/11/2023 0424   ALBUMIN  3.2 (L) 02/10/2023 0513   ALBUMIN  4.5 05/09/2019 1139  ALBUMIN  3.6 01/05/2018 1206    Studies/Results: No results found.    [name] 02/11/2023  Patient ID: Sara Davis, female   DOB: October 10, 1950, 73 y.o.   MRN: [id number]

## 2023-02-11 NOTE — Discharge Summary (Addendum)
    Physician Discharge Summary   Patient ID: Sara Davis MRN: [id number] DOB/AGE: 05/22/1950 72 y.o.  Admit date: 02/09/2023  Discharge date: 02/14/2023  Discharge Diagnoses:  Principal Problem:   Parathyroid  adenoma Active Problems:   Hyperparathyroidism, primary (HCC)   Hyperparathyroidism (HCC)   Primary hyperparathyroidism (HCC)   Discharged Condition: good  Hospital Course: Patient was admitted for observation following parathyroidectomy.  Post op course was complicated by hypocalcemia, hungry bones syndrome.  Pain was well controlled.  Tolerated diet.  Nephrology managed calcium  repletion and hemodialysis.  Plan discharge home on POD#5.  Consults: nephrology  Treatments: surgery: parathyroidectomy  Discharge Exam: Blood pressure (!) 160/65, pulse 77, temperature 98.8 F (37.1 C), resp. rate 12, height 5' 6 (1.676 m), weight 72.8 kg, SpO2 94%.   Disposition: Home  Discharge Instructions     Diet - low sodium heart healthy   Complete by: As directed    Ice pack   Complete by: As directed    Increase activity slowly   Complete by: As directed    No dressing needed   Complete by: As directed       Allergies as of 02/11/2023       Reactions   Aspirin  Other (See Comments)   Upset stomach, can only take coated aspirin  but prefers to not take it at all        Medication List     TAKE these medications    albuterol  108 (90 Base) MCG/ACT inhaler Commonly known as: VENTOLIN  HFA Inhale 1-2 puffs into the lungs every 6 (six) hours as needed for wheezing or shortness of breath.   atorvastatin  80 MG tablet Commonly known as: LIPITOR  Take 1 tablet (80 mg total) by mouth daily.   Auryxia  1 GM 210 MG(Fe) tablet Generic drug: ferric citrate  Take 420 mg by mouth 3 (three) times daily.   capsaicin  0.025 % cream Commonly known as: ZOSTRIX Apply topically 3 (three) times daily as needed. Apply a small amount to knees three times daily as needed    carvedilol  25 MG tablet Commonly known as: COREG  Take 1 tablet (25 mg total) by mouth 2 (two) times daily with a meal.   cinacalcet 60 MG tablet Commonly known as: SENSIPAR Take 120 mg by mouth daily.   DIALYVITE TABLET Tabs Take 1 tablet by mouth daily.   folic acid  1 MG tablet Commonly known as: FOLVITE  Take 1 tablet (1 mg total) by mouth daily.               Discharge Care Instructions  (From admission, onward)           Start     Ordered   02/11/23 0000  No dressing needed        02/11/23 1629            Follow-up Information     Eletha Boas, MD. Schedule an appointment as soon as possible for a visit in 3 week(s).   Specialty: General Surgery Why: For wound re-check Contact information: [address] [place] [address] [phone]                 Boas Eletha, MD [facility] Office: [phone]   Signed: Boas Eletha 02/11/2023, 4:30 PM

## 2023-02-11 NOTE — Progress Notes (Signed)
 D/C order noted. Contacted Berenice Primas to be advised of pt's d/c today and that pt should resume care on Saturday.   Olivia Canter Renal Navigator [phone]

## 2023-02-11 NOTE — Progress Notes (Signed)
 Patient's Calcium  came back at 7.3, and corrected was 8.0. Per nephrology, this patient is good to discharge. We have changed her Calcium  carbonate to 4x daily and she is receiving Calcitriol  2 mcg BID.   Belvie Och, AGNP [facility]

## 2023-02-11 NOTE — Plan of Care (Signed)
 Patient alert/oriented X4. Patient compliant with medication administration. Patient tolerated calcium  gluconate. VSS, no complaints at this time.  Problem: Education: Goal: Knowledge of General Education information will improve Description: Including pain rating scale, medication(s)/side effects and non-pharmacologic comfort measures Outcome: Progressing   Problem: Health Behavior/Discharge Planning: Goal: Ability to manage health-related needs will improve Outcome: Progressing   Problem: Clinical Measurements: Goal: Ability to maintain clinical measurements within normal limits will improve Outcome: Progressing   Problem: Clinical Measurements: Goal: Will remain free from infection Outcome: Progressing   Problem: Clinical Measurements: Goal: Diagnostic test results will improve Outcome: Progressing   Problem: Clinical Measurements: Goal: Respiratory complications will improve Outcome: Progressing   Problem: Clinical Measurements: Goal: Cardiovascular complication will be avoided Outcome: Progressing   Problem: Activity: Goal: Risk for activity intolerance will decrease Outcome: Progressing   Problem: Nutrition: Goal: Adequate nutrition will be maintained Outcome: Progressing   Problem: Coping: Goal: Level of anxiety will decrease Outcome: Progressing   Problem: Elimination: Goal: Will not experience complications related to bowel motility Outcome: Progressing   Problem: Elimination: Goal: Will not experience complications related to urinary retention Outcome: Progressing   Problem: Pain Management: Goal: General experience of comfort will improve Outcome: Progressing   Problem: Safety: Goal: Ability to remain free from injury will improve Outcome: Progressing   Problem: Skin Integrity: Goal: Risk for impaired skin integrity will decrease Outcome: Progressing

## 2023-02-11 NOTE — Progress Notes (Signed)
   02/11/23 1236  Vitals  Temp 98.8 F (37.1 C)  Pulse Rate 77  Resp 12  BP (!) 160/65  SpO2 94 %  O2 Device Room Air  Weight 72.8 kg  Type of Weight Post-Dialysis  Oxygen Therapy  Pulse Oximetry Type Continuous  Oximetry Probe Site Changed No  Post Treatment  Dialyzer Clearance Lightly streaked  Hemodialysis Intake (mL) 0 mL  Liters Processed 84  Fluid Removed (mL) 1500 mL  Tolerated HD Treatment Yes  AVG/AVF Arterial Site Held (minutes) 10 minutes  AVG/AVF Venous Site Held (minutes) 10 minutes   Received patient in bed to unit.  Alert and oriented.  Informed consent signed and in chart.   TX duration:3.5  Patient tolerated well.  Transported back to the room  Alert, without acute distress.  Hand-off given to patient's nurse.   Access used: RUAG Access issues: no complications  Total UF removed: 1500 Medication(s) given: oxy 5mg  po x 1--calcium  gluconate 2grams iv x 1   [facility]

## 2023-02-12 DIAGNOSIS — Z886 Allergy status to analgesic agent status: Secondary | ICD-10-CM | POA: Diagnosis not present

## 2023-02-12 DIAGNOSIS — N186 End stage renal disease: Secondary | ICD-10-CM | POA: Diagnosis present

## 2023-02-12 DIAGNOSIS — F17210 Nicotine dependence, cigarettes, uncomplicated: Secondary | ICD-10-CM | POA: Diagnosis present

## 2023-02-12 DIAGNOSIS — Z992 Dependence on renal dialysis: Secondary | ICD-10-CM | POA: Diagnosis not present

## 2023-02-12 DIAGNOSIS — D472 Monoclonal gammopathy: Secondary | ICD-10-CM | POA: Diagnosis present

## 2023-02-12 DIAGNOSIS — N2581 Secondary hyperparathyroidism of renal origin: Secondary | ICD-10-CM | POA: Diagnosis present

## 2023-02-12 DIAGNOSIS — D351 Benign neoplasm of parathyroid gland: Secondary | ICD-10-CM | POA: Diagnosis present

## 2023-02-12 DIAGNOSIS — Z833 Family history of diabetes mellitus: Secondary | ICD-10-CM | POA: Diagnosis not present

## 2023-02-12 DIAGNOSIS — Z8249 Family history of ischemic heart disease and other diseases of the circulatory system: Secondary | ICD-10-CM | POA: Diagnosis not present

## 2023-02-12 DIAGNOSIS — Z808 Family history of malignant neoplasm of other organs or systems: Secondary | ICD-10-CM | POA: Diagnosis not present

## 2023-02-12 DIAGNOSIS — Z79899 Other long term (current) drug therapy: Secondary | ICD-10-CM | POA: Diagnosis not present

## 2023-02-12 DIAGNOSIS — I120 Hypertensive chronic kidney disease with stage 5 chronic kidney disease or end stage renal disease: Secondary | ICD-10-CM | POA: Diagnosis present

## 2023-02-12 DIAGNOSIS — E875 Hyperkalemia: Secondary | ICD-10-CM | POA: Diagnosis present

## 2023-02-12 DIAGNOSIS — E1122 Type 2 diabetes mellitus with diabetic chronic kidney disease: Secondary | ICD-10-CM | POA: Diagnosis present

## 2023-02-12 DIAGNOSIS — D631 Anemia in chronic kidney disease: Secondary | ICD-10-CM | POA: Diagnosis present

## 2023-02-12 DIAGNOSIS — E785 Hyperlipidemia, unspecified: Secondary | ICD-10-CM | POA: Diagnosis present

## 2023-02-12 DIAGNOSIS — E213 Hyperparathyroidism, unspecified: Secondary | ICD-10-CM | POA: Diagnosis present

## 2023-02-12 LAB — CALCIUM
Calcium: 6.5 mg/dL — ABNORMAL LOW (ref 8.9–10.3)
Calcium: 6.8 mg/dL — ABNORMAL LOW (ref 8.9–10.3)
Calcium: 6.3 mg/dL — CL (ref 8.9–10.3)
Calcium: 7.1 mg/dL — ABNORMAL LOW (ref 8.9–10.3)
Calcium: 7.3 mg/dL — ABNORMAL LOW (ref 8.9–10.3)

## 2023-02-12 MED ORDER — CALCIUM GLUCONATE-NACL 2-0.675 GM/100ML-% IV SOLN
2.0000 g | Freq: Once | INTRAVENOUS | Status: AC
  Administered 2023-02-12: 2000 mg via INTRAVENOUS
  Filled 2023-02-12: qty 100

## 2023-02-12 MED ORDER — CALCIUM CARBONATE 1250 (500 CA) MG PO TABS
4.0000 | ORAL_TABLET | Freq: Three times a day (TID) | ORAL | Status: DC
  Administered 2023-02-12 – 2023-02-14 (×7): 5000 mg via ORAL
  Filled 2023-02-12 (×8): qty 4

## 2023-02-12 MED ORDER — OXYCODONE HCL 5 MG PO TABS
5.0000 mg | ORAL_TABLET | Freq: Three times a day (TID) | ORAL | Status: DC | PRN
Start: 2023-02-12 — End: 2023-02-14

## 2023-02-12 MED ORDER — CALCIUM CARBONATE 1250 (500 CA) MG PO TABS: 4.0000 | ORAL_TABLET | Freq: Four times a day (QID) | ORAL | 6 refills | Status: AC

## 2023-02-12 NOTE — Plan of Care (Signed)
 Patient alert/oriented X4. Patient compliant with medication administration and oxycodone  administered as needed for L should pain. Patient tolerated IV calcium  gluconate and ambulated independently using a walker around the room. Calcium  levels were 6.3 this afternoon, MD aware, VSS.  Problem: Education: Goal: Knowledge of General Education information will improve Description: Including pain rating scale, medication(s)/side effects and non-pharmacologic comfort measures Outcome: Progressing   Problem: Health Behavior/Discharge Planning: Goal: Ability to manage health-related needs will improve Outcome: Progressing   Problem: Clinical Measurements: Goal: Ability to maintain clinical measurements within normal limits will improve Outcome: Progressing   Problem: Clinical Measurements: Goal: Diagnostic test results will improve Outcome: Progressing   Problem: Clinical Measurements: Goal: Cardiovascular complication will be avoided Outcome: Progressing   Problem: Activity: Goal: Risk for activity intolerance will decrease Outcome: Progressing   Problem: Nutrition: Goal: Adequate nutrition will be maintained Outcome: Progressing   Problem: Elimination: Goal: Will not experience complications related to urinary retention Outcome: Progressing   Problem: Pain Management: Goal: General experience of comfort will improve Outcome: Progressing   Problem: Safety: Goal: Ability to remain free from injury will improve Outcome: Progressing   Problem: Skin Integrity: Goal: Risk for impaired skin integrity will decrease Outcome: Progressing

## 2023-02-12 NOTE — Progress Notes (Addendum)
 [facility] Progress Note   Subjective:   Patient seen in room - laying in bed comfortably. Will get HD tomorrow if not d/c today due to sustained hypocalcemia. Reports feeling much better than yesterday, feels well rested. Denies paraesthesias, tremors, or peri-oral numbness. Denies CP/dyspnea.  Objective Vitals:   02/11/23 1707 02/11/23 2125 02/12/23 0432 02/12/23 0751  BP: (!) 116/52 (!) 126/49 (!) 129/58 (!) 117/49  Pulse: 81 73 70 71  Resp: 18 17 18 16   Temp: 98 F (36.7 C) 98.5 F (36.9 C) 98.5 F (36.9 C) 98.1 F (36.7 C)  TempSrc:  Oral Oral Oral  SpO2: 97% 100% 96% 96%  Weight:      Height:       Physical Exam General: Well nourished elderly woman in NAD. Laying comfortably in bed. Healing transverse neck incision.  Heart: RRR, no M/R/G Lungs: CTAB  Abdomen: soft, non-distended, non-tender Extremities: no LE edema Dialysis Access: RUE AVG +thrill/+bruit   Additional Objective Labs: Basic Metabolic Panel: Recent Labs  Lab 02/10/23 0513 02/10/23 1130 02/11/23 0424 02/11/23 1447 02/11/23 1808 02/11/23 2319 02/12/23 0442  NA 139  --  139  --  135  --   --   K 4.7  --  5.2*  --  3.9  --   --   CL 99  --  103  --  94*  --   --   CO2 22  --  20*  --  28  --   --   GLUCOSE 117*  --  104*  --  121*  --   --   BUN 50*  --  51*  --  15  --   --   CREATININE 6.40*  --  7.61*  --  3.60*  --   --   CALCIUM  6.4*   < > 6.3*   < > 7.0* 6.5* 6.8*  PHOS 3.2  --  3.0  --  2.0*  --   --    < > = values in this interval not displayed.   Liver Function Tests: Recent Labs  Lab 02/10/23 0513 02/11/23 0424 02/11/23 1808  ALBUMIN  3.2* 3.1* 3.1*   CBC: Recent Labs  Lab 02/09/23 0625 02/11/23 0755 02/11/23 1808  WBC  --  7.1 5.3  NEUTROABS  --   --  3.5  HGB 12.6 10.7* 10.4*  HCT 37.0 35.3* 33.3*  MCV  --  93.4 92.0  PLT  --  203 207   CBG: Recent Labs  Lab 02/09/23 0606 02/09/23 0841  GLUCAP 93 101*    Medications:   calcitRIOL   2 mcg  Oral BID   calcium  carbonate  3 tablet Oral TID PC & HS   carvedilol   25 mg Oral BID WC   Chlorhexidine  Gluconate Cloth  6 each Topical Q0600    Dialysis Orders:  TTS at [facility]. 3:45, 400/700, EDW 51.5kg, 3K/2.5Ca bath, AVG, no heparin .   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 Cont. holding sensipar 180 mg. Phos 2.0 continue to hold binders for now. Hypocalcemia/hungry bone syndrome: Ca 6.8. Continue calcitriol  2mcg BID + calcium  carbonate 2,500 mg TID + given 3.0 Ca bath with HD yesterday. Has not received IV Ca in almost 24 hours. Patient mildly symptomatic yesterday but completely asymptomatic today. Ca draw scheduled Q6 hours, will reassess after next draw. If Ca is around 7 and pt remains asymptomatic will be ok for d/c today.  Hyperkalemia: normalized with  HD.  ESRD: Continue TTS schedule.  Hypertension/volume: BP ok, UF as tolerated tomorrow. Bed weight not accurate. No LE edema/DOE.  Anemia: Hb 10.4. No ESA needed for now. 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  ADDENDUM 14:46p: Last Ca down to 6.3 - she is completely asymptomatic. Favor keeping her inpatient. Increased PO calcium  carbonate to 4 tabs QID and will give another 2g IV Ca.  Signe Sick, PA-S Izetta Boehringer, PA-C 02/12/2023, 9:59 AM  [facility]  Pt seen, examined and agree w A/P as above.  Myer Fret MD  [facility] 02/12/2023, 6:19 PM

## 2023-02-12 NOTE — Progress Notes (Signed)
 Assessment & Plan: POD#3 - status post left inferior parathyroidectomy - hungry bones syndrome             Calcium  6.3 mg/dl this AM - asymptomatic now             HD tomorrow AM             Pathology consistent with parathyroid  adenoma  Nephrology managing calcium  repletion   Home when stable from nephrology standpoint.        [name], MD [facility] practice Office: [phone]        Chief Complaint: hyperparathyroidism  Subjective: Patient in bed, nurse at bedside.  Comfortable, no complaints.  Left shoulder feels better.  Objective: Vital signs in last 24 hours: Temp:  [98 F (36.7 C)-98.5 F (36.9 C)] 98.1 F (36.7 C) (01/10 0751) Pulse Rate:  [70-81] 71 (01/10 0751) Resp:  [16-18] 16 (01/10 0751) BP: (116-129)/(49-58) 117/49 (01/10 0751) SpO2:  [96 %-100 %] 96 % (01/10 0751) Last BM Date : 02/12/23  Intake/Output from previous day: 01/09 0701 - 01/10 0700 In: 0  Out: 1500  Intake/Output this shift: No intake/output data recorded.  Physical Exam: HEENT - sclerae clear, mucous membranes moist Neck - wound dry and intact; mild STS; Dermabond in place; voice normal  Lab Results:  Recent Labs    02/11/23 0755 02/11/23 1808  WBC 7.1 5.3  HGB 10.7* 10.4*  HCT 35.3* 33.3*  PLT 203 207   BMET Recent Labs    02/11/23 0424 02/11/23 1447 02/11/23 1808 02/11/23 2319 02/12/23 0442 02/12/23 1057  NA 139  --  135  --   --   --   K 5.2*  --  3.9  --   --   --   CL 103  --  94*  --   --   --   CO2 20*  --  28  --   --   --   GLUCOSE 104*  --  121*  --   --   --   BUN 51*  --  15  --   --   --   CREATININE 7.61*  --  3.60*  --   --   --   CALCIUM  6.3*   < > 7.0*   < > 6.8* 6.3*   < > = values in this interval not displayed.   PT/INR No results for input(s): LABPROT, INR in the last 72 hours. Comprehensive Metabolic Panel:    Component Value Date/Time   NA 135 02/11/2023 1808   NA 139 02/11/2023 0424   NA 142  05/09/2019 1139   NA 143 12/20/2018 1147   K 3.9 02/11/2023 1808   K 5.2 (H) 02/11/2023 0424   CL 94 (L) 02/11/2023 1808   CL 103 02/11/2023 0424   CO2 28 02/11/2023 1808   CO2 20 (L) 02/11/2023 0424   BUN 15 02/11/2023 1808   BUN 51 (H) 02/11/2023 0424   BUN 23 05/09/2019 1139   BUN 32 (H) 12/20/2018 1147   CREATININE 3.60 (H) 02/11/2023 1808   CREATININE 7.61 (H) 02/11/2023 0424   CREATININE 5.13 (H) 01/11/2023 0920   CREATININE 3.98 (H) 12/18/2022 1133   CREATININE 0.75 07/31/2013 1059   CREATININE 0.66 07/22/2012 0855   GLUCOSE 121 (H) 02/11/2023 1808   GLUCOSE 104 (H) 02/11/2023 0424   CALCIUM  6.3 (LL) 02/12/2023 1057   CALCIUM  6.8 (L) 02/12/2023 0442   AST 12 (L) 01/11/2023  0920   AST 11 (L) 12/18/2022 1133   ALT <5 01/11/2023 0920   ALT 5 12/18/2022 1133   ALKPHOS 913 (H) 01/11/2023 0920   ALKPHOS 758 (H) 12/18/2022 1133   BILITOT 0.4 01/11/2023 0920   BILITOT 0.4 12/18/2022 1133   PROT 7.1 01/11/2023 0920   PROT 6.5 12/18/2022 1133   PROT 6.3 05/09/2019 1139   PROT 5.6 (L) 01/05/2018 1206   ALBUMIN  3.1 (L) 02/11/2023 1808   ALBUMIN  3.1 (L) 02/11/2023 0424   ALBUMIN  4.5 05/09/2019 1139   ALBUMIN  3.6 01/05/2018 1206    Studies/Results: No results found.    [name] 02/12/2023  Patient ID: Sara Davis, female   DOB: 1951-01-13, 73 y.o.   MRN: [id number]

## 2023-02-13 LAB — CALCIUM
Calcium: 7.6 mg/dL — ABNORMAL LOW (ref 8.9–10.3)
Calcium: 8.4 mg/dL — ABNORMAL LOW (ref 8.9–10.3)
Calcium: 8.3 mg/dL — ABNORMAL LOW (ref 8.9–10.3)

## 2023-02-13 NOTE — Procedures (Signed)
 HD Note:  Some information was entered later than the data was gathered due to patient care needs. The stated time with the data is accurate.  Received patient in bed to unit.   Alert and oriented.   Informed consent signed and in chart.   Access used: Right upper arm graft Access issues: None  Patient tolerated treatment well.   TX duration: 3 hours  Alert, without acute distress.  Total UF removed: 1500 ml  Hand-off given to patient's nurse.   Transported back to the room   Zaylin Runco L. Lenon, RN [facility].

## 2023-02-13 NOTE — Progress Notes (Signed)
  [facility] Progress Note   Subjective:   Patient seen and examined at bedside. Feeling better today.  Joints no longer stiff and achy.  Calcium  improved. Discussed with patient that she should no longer take sensipar at home, she reports understanding, X placed on her medicine bottle as a reminder.    Medical stable for discharge from renal standpoint but no way home. She just found out her son is out of town and will not be back until tomorrow.   Objective Vitals:   02/12/23 0751 02/12/23 2101 02/13/23 0616 02/13/23 0800  BP: (!) 117/49 (!) 138/59 (!) 163/66 (!) 146/54  Pulse: 71 73 72 77  Resp: 16 18 18 16   Temp: 98.1 F (36.7 C) 98.7 F (37.1 C) 98.4 F (36.9 C) 99.1 F (37.3 C)  TempSrc: Oral Oral Oral Oral  SpO2: 96% 97% 95% 95%  Weight:      Height:       Physical Exam General:chronically ill appearing, thin female in NAD Heart:RRR, no mrg Lungs:CTAB, nml WOB on RA Abdomen:soft, NTND Extremities:no LE edema Dialysis Access: RU AVG +b/t   Filed Weights   02/09/23 0608 02/11/23 0807 02/11/23 1236  Weight: 52.6 kg 74.3 kg 72.8 kg   No intake or output data in the 24 hours ending 02/13/23 1313  Additional Objective Labs: Basic Metabolic Panel: Recent Labs  Lab 02/10/23 0513 02/10/23 1130 02/11/23 0424 02/11/23 1447 02/11/23 1808 02/11/23 2319 02/12/23 1717 02/12/23 2302 02/13/23 0725  NA 139  --  139  --  135  --   --   --   --   K 4.7  --  5.2*  --  3.9  --   --   --   --   CL 99  --  103  --  94*  --   --   --   --   CO2 22  --  20*  --  28  --   --   --   --   GLUCOSE 117*  --  104*  --  121*  --   --   --   --   BUN 50*  --  51*  --  15  --   --   --   --   CREATININE 6.40*  --  7.61*  --  3.60*  --   --   --   --   CALCIUM  6.4*   < > 6.3*   < > 7.0*   < > 7.1* 7.3* 7.6*  PHOS 3.2  --  3.0  --  2.0*  --   --   --   --    < > = values in this interval not displayed.   Liver Function Tests: Recent Labs  Lab 02/10/23 0513  02/11/23 0424 02/11/23 1808  ALBUMIN  3.2* 3.1* 3.1*   CBC: Recent Labs  Lab 02/09/23 0625 02/11/23 0755 02/11/23 1808  WBC  --  7.1 5.3  NEUTROABS  --   --  3.5  HGB 12.6 10.7* 10.4*  HCT 37.0 35.3* 33.3*  MCV  --  93.4 92.0  PLT  --  203 207   Blood Culture    Component Value Date/Time   SDES BLOOD LEFT HAND 06/15/2022 2315   SPECREQUEST  06/15/2022 2315    BOTTLES DRAWN AEROBIC AND ANAEROBIC Blood Culture adequate volume   CULT  06/15/2022 2315    NO GROWTH 5 DAYS Performed at [facility], [address] [place], [place] [address]    REPTSTATUS 06/20/2022 FINAL 06/15/2022 2315    Lab Results  Component Value Date   INR 1.1 06/15/2022   INR 1.1 12/13/2018   INR 1.10 01/07/2018   Studies/Results: No results found.  Medications:   calcitRIOL   2 mcg Oral BID   calcium  carbonate  4 tablet Oral TID PC & HS   carvedilol   25 mg Oral BID WC   Chlorhexidine  Gluconate Cloth  6 each Topical Q0600    Dialysis Orders: TTS at [facility]. 3:45, 400/700, EDW 51.5kg, 3K/2.5Ca bath, AVG, no heparin .   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 D/c sensipar 180 mg - discussed with patient. Holding binders due to low phos.  Hypocalcemia/hungry bone syndrome: Ca improved to 7.6. Continue calcitriol  2mcg BID + calcium  carbonate 5000 mg TID + given 3.0 Ca bath with HD. IV Calcium  last given yesterday.  Patient mildly symptomatic yesterday but completely asymptomatic today. Continue current regimen on discharge. Hyperkalemia: normalized with HD.  ESRD: Continue TTS schedule. HD today.  Hypertension/volume: BP ok, UF as tolerated. Bed weight not accurate. No LE edema/DOE.  Anemia: Hb 10.4. No ESA needed for now. 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  Dispo - does not have a way home today.  Per patient son to return in town tomorrow and will be able to pick  her up.    Manuelita Labella, PA-C [facility] 02/13/2023,1:13 PM  LOS: 1 day

## 2023-02-13 NOTE — Progress Notes (Signed)
 Assessment & Plan: POD#4 - status post left inferior parathyroidectomy - hungry bones syndrome             Calcium  7.6 mg/dl this AM - asymptomatic now             HD this AM             Pathology consistent with parathyroid  adenoma             Nephrology managing calcium  repletion   Likely home today after HD if OK with nephrology.        [name], MD [facility] practice Office: [phone]        Chief Complaint: Hyperparathyroidism  Subjective: Patient in bed, comfortable.  No complaints.  Shoulder pain improved.  Objective: Vital signs in last 24 hours: Temp:  [98.4 F (36.9 C)-99.1 F (37.3 C)] 99.1 F (37.3 C) (01/11 0800) Pulse Rate:  [72-77] 77 (01/11 0800) Resp:  [16-18] 16 (01/11 0800) BP: (138-163)/(54-66) 146/54 (01/11 0800) SpO2:  [95 %-97 %] 95 % (01/11 0800) Last BM Date : 02/12/23  Intake/Output from previous day: No intake/output data recorded. Intake/Output this shift: No intake/output data recorded.  Physical Exam: HEENT - sclerae clear, mucous membranes moist Neck - wound dry and intact; voice normal Ext - no edema, non-tender  Lab Results:  Recent Labs    02/11/23 0755 02/11/23 1808  WBC 7.1 5.3  HGB 10.7* 10.4*  HCT 35.3* 33.3*  PLT 203 207   BMET Recent Labs    02/11/23 0424 02/11/23 1447 02/11/23 1808 02/11/23 2319 02/12/23 2302 02/13/23 0725  NA 139  --  135  --   --   --   K 5.2*  --  3.9  --   --   --   CL 103  --  94*  --   --   --   CO2 20*  --  28  --   --   --   GLUCOSE 104*  --  121*  --   --   --   BUN 51*  --  15  --   --   --   CREATININE 7.61*  --  3.60*  --   --   --   CALCIUM  6.3*   < > 7.0*   < > 7.3* 7.6*   < > = values in this interval not displayed.   PT/INR No results for input(s): LABPROT, INR in the last 72 hours. Comprehensive Metabolic Panel:    Component Value Date/Time   NA 135 02/11/2023 1808   NA 139 02/11/2023 0424   NA 142 05/09/2019 1139   NA  143 12/20/2018 1147   K 3.9 02/11/2023 1808   K 5.2 (H) 02/11/2023 0424   CL 94 (L) 02/11/2023 1808   CL 103 02/11/2023 0424   CO2 28 02/11/2023 1808   CO2 20 (L) 02/11/2023 0424   BUN 15 02/11/2023 1808   BUN 51 (H) 02/11/2023 0424   BUN 23 05/09/2019 1139   BUN 32 (H) 12/20/2018 1147   CREATININE 3.60 (H) 02/11/2023 1808   CREATININE 7.61 (H) 02/11/2023 0424   CREATININE 5.13 (H) 01/11/2023 0920   CREATININE 3.98 (H) 12/18/2022 1133   CREATININE 0.75 07/31/2013 1059   CREATININE 0.66 07/22/2012 0855   GLUCOSE 121 (H) 02/11/2023 1808   GLUCOSE 104 (H) 02/11/2023 0424   CALCIUM  7.6 (L) 02/13/2023 0725   CALCIUM  7.3 (L) 02/12/2023 2302   AST  12 (L) 01/11/2023 0920   AST 11 (L) 12/18/2022 1133   ALT <5 01/11/2023 0920   ALT 5 12/18/2022 1133   ALKPHOS 913 (H) 01/11/2023 0920   ALKPHOS 758 (H) 12/18/2022 1133   BILITOT 0.4 01/11/2023 0920   BILITOT 0.4 12/18/2022 1133   PROT 7.1 01/11/2023 0920   PROT 6.5 12/18/2022 1133   PROT 6.3 05/09/2019 1139   PROT 5.6 (L) 01/05/2018 1206   ALBUMIN  3.1 (L) 02/11/2023 1808   ALBUMIN  3.1 (L) 02/11/2023 0424   ALBUMIN  4.5 05/09/2019 1139   ALBUMIN  3.6 01/05/2018 1206    Studies/Results: No results found.    [name] 02/13/2023  Patient ID: Sara Davis, female   DOB: 1950-05-12, 74 y.o.   MRN: [id number]

## 2023-02-14 LAB — CALCIUM
Calcium: 8.9 mg/dL (ref 8.9–10.3)
Calcium: 9.2 mg/dL (ref 8.9–10.3)
Calcium: 9.3 mg/dL (ref 8.9–10.3)

## 2023-02-14 NOTE — Progress Notes (Signed)
 Assessment & Plan: POD#5 - status post left inferior parathyroidectomy - hungry bones syndrome             Calcium  8.9 mg/dl this AM - asymptomatic now             HD yesterday afternoon - did not have a ride home afterwards             Pathology consistent with parathyroid  adenoma  Plan discharge home today.        Sara Spinner, MD [facility] practice Office: [phone]        Chief Complaint: hyperparathyroidism  Subjective: Patient in bed, anxious to go home.  Has ride today.  Objective: Vital signs in last 24 hours: Temp:  [98.1 F (36.7 C)-99.1 F (37.3 C)] 98.1 F (36.7 C) (01/12 0635) Pulse Rate:  [65-77] 69 (01/12 0635) Resp:  [12-18] 18 (01/12 0635) BP: (139-179)/(51-109) 163/60 (01/12 0635) SpO2:  [93 %-99 %] 99 % (01/12 0635) Weight:  [49.6 kg] 49.6 kg (01/11 1440) Last BM Date : 02/13/23  Intake/Output from previous day: 01/11 0701 - 01/12 0700 In: -  Out: 1500  Intake/Output this shift: No intake/output data recorded.  Physical Exam: HEENT - sclerae clear, mucous membranes moist Neck - wound dry and intact; Dermabond in place; voice normal  Lab Results:  Recent Labs    02/11/23 0755 02/11/23 1808  WBC 7.1 5.3  HGB 10.7* 10.4*  HCT 35.3* 33.3*  PLT 203 207   BMET Recent Labs    02/11/23 1808 02/11/23 2319 02/13/23 1921 02/14/23 0125  NA 135  --   --   --   K 3.9  --   --   --   CL 94*  --   --   --   CO2 28  --   --   --   GLUCOSE 121*  --   --   --   BUN 15  --   --   --   CREATININE 3.60*  --   --   --   CALCIUM  7.0*   < > 8.3* 8.9   < > = values in this interval not displayed.   PT/INR No results for input(s): LABPROT, INR in the last 72 hours. Comprehensive Metabolic Panel:    Component Value Date/Time   NA 135 02/11/2023 1808   NA 139 02/11/2023 0424   NA 142 05/09/2019 1139   NA 143 12/20/2018 1147   K 3.9 02/11/2023 1808   K 5.2 (H) 02/11/2023 0424   CL 94 (L) 02/11/2023 1808    CL 103 02/11/2023 0424   CO2 28 02/11/2023 1808   CO2 20 (L) 02/11/2023 0424   BUN 15 02/11/2023 1808   BUN 51 (H) 02/11/2023 0424   BUN 23 05/09/2019 1139   BUN 32 (H) 12/20/2018 1147   CREATININE 3.60 (H) 02/11/2023 1808   CREATININE 7.61 (H) 02/11/2023 0424   CREATININE 5.13 (H) 01/11/2023 0920   CREATININE 3.98 (H) 12/18/2022 1133   CREATININE 0.75 07/31/2013 1059   CREATININE 0.66 07/22/2012 0855   GLUCOSE 121 (H) 02/11/2023 1808   GLUCOSE 104 (H) 02/11/2023 0424   CALCIUM  8.9 02/14/2023 0125   CALCIUM  8.3 (L) 02/13/2023 1921   AST 12 (L) 01/11/2023 0920   AST 11 (L) 12/18/2022 1133   ALT <5 01/11/2023 0920   ALT 5 12/18/2022 1133   ALKPHOS 913 (H) 01/11/2023 0920   ALKPHOS 758 (H) 12/18/2022 1133  BILITOT 0.4 01/11/2023 0920   BILITOT 0.4 12/18/2022 1133   PROT 7.1 01/11/2023 0920   PROT 6.5 12/18/2022 1133   PROT 6.3 05/09/2019 1139   PROT 5.6 (L) 01/05/2018 1206   ALBUMIN  3.1 (L) 02/11/2023 1808   ALBUMIN  3.1 (L) 02/11/2023 0424   ALBUMIN  4.5 05/09/2019 1139   ALBUMIN  3.6 01/05/2018 1206    Studies/Results: No results found.    Sara Davis 02/14/2023  Patient ID: Sara Davis, female   DOB: 11-18-1950, 73 y.o.   MRN: [id number]

## 2023-02-14 NOTE — Discharge Planning (Signed)
 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 - s/p left inferior parathyroidectomy 1/7    Hypocalcemia/Hungry bone syndrome  Aranesp : Given: no    Last Hgb: 10.4 PRBC's Given: no  ESA dose for discharge: none IV Iron  dose at discharge: none  Heparin  change: no  EDW Change: 49.5kg   Bath Change: 3K 3Ca if possible  Access intervention/Change: no Details:  Hectorol/Calcitriol  change: no  Discharge Labs: Calcium  9.3 Phosphorus 2.0 Albumin  3.1 K+ 3.9  IV Antibiotics: no Details:  On Coumadin?: no Last INR: Next INR: Managed By:   OTHER/APPTS/LAB ORDERS: Check weekly Calcium , repeat phos this week Home regimen: calcitriol  2mcg BID, Tums 4 tab QID - to titrate down as needed per Calcium  level Auryxia  held d/t low phos. Sensipar d/c   D/C Meds to be reconciled by nurse after every discharge.  Completed By: Manuelita Labella, PA-C   Reviewed by: MD:______ RN_______

## 2023-02-14 NOTE — Progress Notes (Signed)
 [facility] Progress Note   Subjective:   Patient seen and examined at bedside.  Feeling good today.  Tolerated dialysis well yesterday.  Plans to discharge.  Ca improved to 9.2.   Objective Vitals:   02/13/23 1813 02/13/23 1824 02/13/23 2122 02/14/23 0635  BP: (!) 159/109 (!) 175/69 (!) 139/51 (!) 163/60  Pulse: 66 68 74 69  Resp: 15 14 18 18   Temp:   98.6 F (37 C) 98.1 F (36.7 C)  TempSrc:   Oral Oral  SpO2: 97% 94% 93% 99%  Weight:      Height:       Physical Exam General: chronically ill appearing, thin female in NAD Heart:RRR, no mrg Lungs:CTAB, nml WOB on RA Abdomen:soft, NTND Extremities:no LE edema Dialysis Access: RU AVG +b/t   Filed Weights   02/11/23 0807 02/11/23 1236 02/13/23 1440  Weight: 74.3 kg 72.8 kg 49.6 kg    Intake/Output Summary (Last 24 hours) at 02/14/2023 1009 Last data filed at 02/13/2023 1824 Gross per 24 hour  Intake --  Output 1500 ml  Net -1500 ml    Additional Objective Labs: Basic Metabolic Panel: Recent Labs  Lab 02/10/23 0513 02/10/23 1130 02/11/23 0424 02/11/23 1447 02/11/23 1808 02/11/23 2319 02/13/23 1921 02/14/23 0125 02/14/23 0812  NA 139  --  139  --  135  --   --   --   --   K 4.7  --  5.2*  --  3.9  --   --   --   --   CL 99  --  103  --  94*  --   --   --   --   CO2 22  --  20*  --  28  --   --   --   --   GLUCOSE 117*  --  104*  --  121*  --   --   --   --   BUN 50*  --  51*  --  15  --   --   --   --   CREATININE 6.40*  --  7.61*  --  3.60*  --   --   --   --   CALCIUM  6.4*   < > 6.3*   < > 7.0*   < > 8.3* 8.9 9.2  PHOS 3.2  --  3.0  --  2.0*  --   --   --   --    < > = values in this interval not displayed.   Liver Function Tests: Recent Labs  Lab 02/10/23 0513 02/11/23 0424 02/11/23 1808  ALBUMIN  3.2* 3.1* 3.1*   CBC: Recent Labs  Lab 02/09/23 0625 02/11/23 0755 02/11/23 1808  WBC  --  7.1 5.3  NEUTROABS  --   --  3.5  HGB 12.6 10.7* 10.4*  HCT 37.0 35.3* 33.3*  MCV  --   93.4 92.0  PLT  --  203 207   Blood Culture    Component Value Date/Time   SDES BLOOD LEFT HAND 06/15/2022 2315   SPECREQUEST  06/15/2022 2315    BOTTLES DRAWN AEROBIC AND ANAEROBIC Blood Culture adequate volume   CULT  06/15/2022 2315    NO GROWTH 5 DAYS Performed at [facility], [address], [place], [place] [address]    REPTSTATUS 06/20/2022 FINAL 06/15/2022 2315    CBG: Recent Labs  Lab 02/09/23 0606 02/09/23 0841  GLUCAP 93 101*    Medications:  calcitRIOL   2 mcg Oral BID   calcium  carbonate  4 tablet Oral TID PC & HS   carvedilol   25 mg Oral BID WC   Chlorhexidine  Gluconate Cloth  6 each Topical Q0600    Dialysis Orders: TTS at [facility]. 3:45, 400/700, EDW 51.5kg, 3K/2.5Ca bath, AVG, no heparin .   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 D/c sensipar 180 mg - discussed with patient. Holding binders due to low phos.  Hypocalcemia/hungry bone syndrome: Ca improved to 9.1. Continue calcitriol  2mcg BID + calcium  carbonate 5000 mg TID + 3.0 Ca bath with HD. IV Calcium  last given 1/10.  Symptoms resolved.  Continue current regimen on discharge, can titrate as outpatient. Hyperkalemia: normalized with HD.  ESRD: Continue TTS schedule. Next HD 02/16/23.  Hypertension/volume: BP elevated, UF as tolerated. Under edw yesterday.  Possible weight loss, will lower to 49.5kg.  Anemia: Hb 10.4. No ESA needed for now. 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  Dispo - plan to d/c today.    Manuelita Labella, PA-C [facility] 02/14/2023,10:09 AM  LOS: 2 days

## 2023-02-15 ENCOUNTER — Telehealth: Payer: Self-pay | Admitting: Nephrology

## 2023-02-15 NOTE — Telephone Encounter (Signed)
 Transition of care contact from inpatient facility  Date of Discharge: 02/14/23 Date of Contact: 02/15/23 Method of contact: Phone  Attempted to contact patient to discuss transition of care from inpatient admission. Patient did not answer the phone. Will continue outreach efforts

## 2023-02-15 NOTE — Progress Notes (Signed)
 Late Note Entry- Feb 15, 2023  Pt was d/c yesterday. Contacted Berenice Primas this morning to be advised of pt's d/c date and that pt should resume care tomorrow.   Olivia Canter Renal Navigator [phone]

## 2023-03-13 ENCOUNTER — Encounter (HOSPITAL_COMMUNITY): Payer: Self-pay

## 2023-03-13 ENCOUNTER — Emergency Department (HOSPITAL_COMMUNITY)
Admission: EM | Admit: 2023-03-13 | Discharge: 2023-03-13 | Disposition: A | Discharge status: Home / Self Care | Payer: Medicare (Managed Care) | Attending: Emergency Medicine | Admitting: Emergency Medicine

## 2023-03-13 ENCOUNTER — Other Ambulatory Visit: Payer: Self-pay

## 2023-03-13 DIAGNOSIS — I959 Hypotension, unspecified: Secondary | ICD-10-CM | POA: Insufficient documentation

## 2023-03-13 DIAGNOSIS — Z992 Dependence on renal dialysis: Secondary | ICD-10-CM | POA: Insufficient documentation

## 2023-03-13 DIAGNOSIS — N186 End stage renal disease: Secondary | ICD-10-CM | POA: Diagnosis not present

## 2023-03-13 DIAGNOSIS — R031 Nonspecific low blood-pressure reading: Secondary | ICD-10-CM

## 2023-03-13 LAB — CBC WITH DIFFERENTIAL/PLATELET
Abs Immature Granulocytes: 0.01 10*3/uL (ref 0.00–0.07)
Basophils Absolute: 0 10*3/uL (ref 0.0–0.1)
Basophils Relative: 0 %
Eosinophils Absolute: 0.1 10*3/uL (ref 0.0–0.5)
Eosinophils Relative: 1 %
HCT: 33.1 % — ABNORMAL LOW (ref 36.0–46.0)
Hemoglobin: 10.1 g/dL — ABNORMAL LOW (ref 12.0–15.0)
Immature Granulocytes: 0 %
Lymphocytes Relative: 25 %
Lymphs Abs: 1.6 10*3/uL (ref 0.7–4.0)
MCH: 27.9 pg (ref 26.0–34.0)
MCHC: 30.5 g/dL (ref 30.0–36.0)
MCV: 91.4 fL (ref 80.0–100.0)
Monocytes Absolute: 0.5 10*3/uL (ref 0.1–1.0)
Monocytes Relative: 8 %
Neutro Abs: 4.4 10*3/uL (ref 1.7–7.7)
Neutrophils Relative %: 66 %
Platelets: 215 10*3/uL (ref 150–400)
RBC: 3.62 MIL/uL — ABNORMAL LOW (ref 3.87–5.11)
RDW: 14.6 % (ref 11.5–15.5)
WBC: 6.6 10*3/uL (ref 4.0–10.5)
nRBC: 0 % (ref 0.0–0.2)

## 2023-03-13 LAB — BASIC METABOLIC PANEL
Anion gap: 14 (ref 5–15)
BUN: 42 mg/dL — ABNORMAL HIGH (ref 8–23)
CO2: 26 mmol/L (ref 22–32)
Calcium: 8.5 mg/dL — ABNORMAL LOW (ref 8.9–10.3)
Chloride: 93 mmol/L — ABNORMAL LOW (ref 98–111)
Creatinine, Ser: 5.8 mg/dL — ABNORMAL HIGH (ref 0.44–1.00)
GFR, Estimated: 7 mL/min — ABNORMAL LOW (ref >60)
Glucose, Bld: 238 mg/dL — ABNORMAL HIGH (ref 70–99)
Potassium: 4.5 mmol/L (ref 3.5–5.1)
Sodium: 133 mmol/L — ABNORMAL LOW (ref 135–145)

## 2023-03-13 NOTE — ED Notes (Signed)
 Pt agreeable to return to HD today. HD [facility] contacted [phone], and can receive her back for HD. May return after d/c from ED.

## 2023-03-13 NOTE — ED Notes (Signed)
"  Ready to go", family present, declines food and drink, "going to get breakfast, and then go to HD". Out in w/c with RN to POV. VSS.

## 2023-03-13 NOTE — ED Triage Notes (Signed)
 Patient went to dialysis this morning and they would not dialyze her due to low BP.  Patient complains of generalized weakness but no other complaints.

## 2023-03-13 NOTE — ED Provider Notes (Signed)
  EMERGENCY DEPARTMENT AT [facility] Provider Note   CSN: [id number] Arrival date & time: 03/13/23  9356     History  Chief Complaint  Patient presents with   Hypotension    Sara Davis is a 73 y.o. female.  Pt with esrd/hd tthsat, went to dialysis today, and was sent to ED as bp was low, 90/60 range per pt/family report. Pt indicates had normal dialysis Thursday. Indicates otherwise feels fine now. Indicates had not eaten today, and did take her blood pressure medication earlier this AM. Denies change in meds. Normal appetite. No nvd. No dysuria or gu c/o. No abd pain. Denies chest pain or sob. No cough or uri symptoms. No fever or chills. No extremity pain or swelling. No syncope. Denies blood loss, rectal bleeding or melena.   The history is provided by the patient, medical records and a relative.       Home Medications Prior to Admission medications   Medication Sig Start Date End Date Taking? Authorizing Provider  albuterol  (VENTOLIN  HFA) 108 (90 Base) MCG/ACT inhaler Inhale 1-2 puffs into the lungs every 6 (six) hours as needed for wheezing or shortness of breath. Patient not taking: Reported on 02/09/2023 10/27/22   [provider]  atorvastatin  (LIPITOR ) 80 MG tablet Take 1 tablet (80 mg total) by mouth daily. 12/20/18   Luke Vernell BRAVO, MD  B Complex-C-Folic Acid  (DIALYVITE TABLET) TABS Take 1 tablet by mouth daily. 03/05/22   [provider]  calcitRIOL  (ROCALTROL ) 0.5 MCG capsule Take 4 capsules (2 mcg total) by mouth 2 (two) times daily. 02/11/23   Stovall, Kathryn R, PA-C  calcium  carbonate (OS-CAL - DOSED IN MG OF ELEMENTAL CALCIUM ) 1250 (500 Ca) MG tablet Take 4 tablets (5,000 mg total) by mouth 4 (four) times daily. 02/12/23   Stovall, Kathryn R, PA-C  capsaicin  (ZOSTRIX) 0.025 % cream Apply topically 3 (three) times daily as needed. Apply a small amount to knees three times daily as needed Patient not taking: Reported on 02/09/2023  10/20/22   Cornelius Dines, MD  carvedilol  (COREG ) 25 MG tablet Take 1 tablet (25 mg total) by mouth 2 (two) times daily with a meal. 05/09/19   Ebb Lauraine Flatness, NP  folic acid  (FOLVITE ) 1 MG tablet Take 1 tablet (1 mg total) by mouth daily. Patient not taking: Reported on 02/09/2023 10/20/22   Cornelius Dines, MD      Allergies    Aspirin     Review of Systems   Review of Systems  Constitutional:  Negative for chills, diaphoresis and fever.  HENT:  Negative for sore throat.   Eyes:  Negative for redness.  Respiratory:  Negative for cough and shortness of breath.   Cardiovascular:  Negative for chest pain and leg swelling.  Gastrointestinal:  Negative for abdominal pain, blood in stool, diarrhea and vomiting.  Genitourinary:  Negative for dysuria, flank pain and vaginal bleeding.  Musculoskeletal:  Negative for back pain and neck pain.  Skin:  Negative for rash.  Neurological:  Negative for syncope and headaches.    Physical Exam Updated Vital Signs BP (!) 106/59 (BP Location: Left Arm)   Pulse 68   Temp 97.7 F (36.5 C) (Oral)   Resp 14   Ht 1.676 m (5' 6)   Wt 49.4 kg   SpO2 100%   BMI 17.59 kg/m  Physical Exam Vitals and nursing note reviewed.  Constitutional:      Appearance: Normal appearance. She is well-developed.  HENT:  Head: Atraumatic.     Nose: Nose normal.     Mouth/Throat:     Mouth: Mucous membranes are moist.  Eyes:     General: No scleral icterus.    Conjunctiva/sclera: Conjunctivae normal.     Pupils: Pupils are equal, round, and reactive to light.  Neck:     Trachea: No tracheal deviation.     Comments: Trachea midline. Thyroid  not grossly enlarged or tender. No stiffness or rigidity.  Cardiovascular:     Rate and Rhythm: Normal rate and regular rhythm.     Pulses: Normal pulses.     Heart sounds: Normal heart sounds. No murmur heard.    No friction rub. No gallop.  Pulmonary:     Effort: Pulmonary effort is normal. No respiratory distress.      Breath sounds: Normal breath sounds.  Abdominal:     General: Bowel sounds are normal. There is no distension.     Palpations: Abdomen is soft. There is no mass.     Tenderness: There is no abdominal tenderness.  Genitourinary:    Comments: No cva tenderness.  Musculoskeletal:        General: No swelling or tenderness.     Cervical back: Normal range of motion and neck supple. No rigidity. No muscular tenderness.     Right lower leg: No edema.     Left lower leg: No edema.     Comments: HD access RUE w palp thrill. No swelling or redness. No sign of infection to area.   Skin:    General: Skin is warm and dry.     Findings: No rash.  Neurological:     Mental Status: She is alert.     Comments: Alert, speech normal. Motor/sens grossly intact bil.   Psychiatric:        Mood and Affect: Mood normal.     ED Results / Procedures / Treatments   Labs (all labs ordered are listed, but only abnormal results are displayed) Results for orders placed or performed during the hospital encounter of 03/13/23  CBC with Differential   Collection Time: 03/13/23  7:13 AM  Result Value Ref Range   WBC 6.6 4.0 - 10.5 K/uL   RBC 3.62 (L) 3.87 - 5.11 MIL/uL   Hemoglobin 10.1 (L) 12.0 - 15.0 g/dL   HCT 66.8 (L) 63.9 - 53.9 %   MCV 91.4 80.0 - 100.0 fL   MCH 27.9 26.0 - 34.0 pg   MCHC 30.5 30.0 - 36.0 g/dL   RDW 85.3 88.4 - 84.4 %   Platelets 215 150 - 400 K/uL   nRBC 0.0 0.0 - 0.2 %   Neutrophils Relative % 66 %   Neutro Abs 4.4 1.7 - 7.7 K/uL   Lymphocytes Relative 25 %   Lymphs Abs 1.6 0.7 - 4.0 K/uL   Monocytes Relative 8 %   Monocytes Absolute 0.5 0.1 - 1.0 K/uL   Eosinophils Relative 1 %   Eosinophils Absolute 0.1 0.0 - 0.5 K/uL   Basophils Relative 0 %   Basophils Absolute 0.0 0.0 - 0.1 K/uL   Immature Granulocytes 0 %   Abs Immature Granulocytes 0.01 0.00 - 0.07 K/uL  Basic metabolic panel   Collection Time: 03/13/23  7:13 AM  Result Value Ref Range   Sodium 133 (L) 135 -  145 mmol/L   Potassium 4.5 3.5 - 5.1 mmol/L   Chloride 93 (L) 98 - 111 mmol/L   CO2 26 22 - 32 mmol/L  Glucose, Bld 238 (H) 70 - 99 mg/dL   BUN 42 (H) 8 - 23 mg/dL   Creatinine, Ser 4.19 (H) 0.44 - 1.00 mg/dL   Calcium  8.5 (L) 8.9 - 10.3 mg/dL   GFR, Estimated 7 (L) >60 mL/min   Anion gap 14 5 - 15    EKG None  Radiology No results found.  Procedures Procedures    Medications Ordered in ED Medications - No data to display  ED Course/ Medical Decision Making/ A&P                                 Medical Decision Making Problems Addressed: ESRD on dialysis [facility]): acute illness or injury    Details: Acute and chronic Low blood pressure, not hypotension: acute illness or injury with systemic symptoms that poses a threat to life or bodily functions  Amount and/or Complexity of Data Reviewed Independent Historian:     Details: Family, hx External Data Reviewed: notes. Labs: ordered. Decision-making details documented in ED Course.  Risk Decision regarding hospitalization.   Iv ns. Continuous pulse ox and cardiac monitoring. Labs ordered/sent.   Differential diagnosis includes hyperkalemia, worsening anemia, etc. Dispo decision including potential need for admission considered - will get labs and reassess.   Reviewed nursing notes and prior charts for additional history. External reports reviewed. Additional history from:  Cardiac monitor: sinus rhythm, rate 68.  Labs reviewed/interpreted by me - k normal. Ca sl low, pt on ca replacement therapy. HCT 33 c/w baseline. Hco3 normal.   RN to contact pts dialysis center to ensure can be dialyzed today.   Po fluids/food. Pt currently indicates no symptoms, feels fine. Bp 106/59. Hr 58. Rr 14, pulse ox 100%.    Pt currently appears stable for ED d/c.   Rec close renal/pcp f/u.  Return precautions provided.          Final Clinical Impression(s) / ED Diagnoses Final diagnoses:  None    Rx / DC Orders ED  Discharge Orders     None         Bernard Drivers, MD 03/13/23 [phone]

## 2023-03-13 NOTE — Discharge Instructions (Signed)
 It was our pleasure to provide your ER care today - we hope that you feel better.  Go to your dialysis center today/now.  Follow up closely with primary care doctor in the next 2-3 days for recheck - keep a log of blood pressures at home, and bring that to your doctors visit - discuss whether or not to continue your current blood pressure medication and dose.   Your calcium  level is slightly low - continue calcium  supplement, eat balanced diet, and follow up with your doctor.   Return to ER right away if worse, new symptoms, fevers, chest pain, trouble breathing, fainting, or other concern.

## 2023-04-12 ENCOUNTER — Inpatient Hospital Stay: Payer: Medicare (Managed Care) | Attending: Hematology and Oncology

## 2023-04-12 DIAGNOSIS — F17210 Nicotine dependence, cigarettes, uncomplicated: Secondary | ICD-10-CM | POA: Insufficient documentation

## 2023-04-12 DIAGNOSIS — C9000 Multiple myeloma not having achieved remission: Secondary | ICD-10-CM | POA: Insufficient documentation

## 2023-04-12 DIAGNOSIS — D649 Anemia, unspecified: Secondary | ICD-10-CM | POA: Insufficient documentation

## 2023-04-12 DIAGNOSIS — N189 Chronic kidney disease, unspecified: Secondary | ICD-10-CM | POA: Insufficient documentation

## 2023-04-12 DIAGNOSIS — I129 Hypertensive chronic kidney disease with stage 1 through stage 4 chronic kidney disease, or unspecified chronic kidney disease: Secondary | ICD-10-CM | POA: Insufficient documentation

## 2023-04-19 ENCOUNTER — Telehealth: Payer: Self-pay | Admitting: Hematology and Oncology

## 2023-04-19 ENCOUNTER — Telehealth: Payer: Self-pay

## 2023-04-19 ENCOUNTER — Inpatient Hospital Stay: Payer: Medicare (Managed Care)

## 2023-04-19 ENCOUNTER — Inpatient Hospital Stay: Payer: Medicare (Managed Care) | Admitting: Hematology and Oncology

## 2023-04-19 VITALS — BP 159/85 | HR 78 | Temp 97.8°F | Resp 14 | Wt 119.8 lb

## 2023-04-19 DIAGNOSIS — D649 Anemia, unspecified: Secondary | ICD-10-CM | POA: Diagnosis not present

## 2023-04-19 DIAGNOSIS — R768 Other specified abnormal immunological findings in serum: Secondary | ICD-10-CM

## 2023-04-19 DIAGNOSIS — N189 Chronic kidney disease, unspecified: Secondary | ICD-10-CM | POA: Diagnosis not present

## 2023-04-19 DIAGNOSIS — I129 Hypertensive chronic kidney disease with stage 1 through stage 4 chronic kidney disease, or unspecified chronic kidney disease: Secondary | ICD-10-CM | POA: Diagnosis not present

## 2023-04-19 DIAGNOSIS — C9000 Multiple myeloma not having achieved remission: Secondary | ICD-10-CM | POA: Diagnosis present

## 2023-04-19 DIAGNOSIS — D472 Monoclonal gammopathy: Secondary | ICD-10-CM | POA: Diagnosis not present

## 2023-04-19 DIAGNOSIS — F17210 Nicotine dependence, cigarettes, uncomplicated: Secondary | ICD-10-CM | POA: Diagnosis not present

## 2023-04-19 LAB — CMP (CANCER CENTER ONLY)
ALT: 6 U/L (ref 0–44)
AST: 12 U/L — ABNORMAL LOW (ref 15–41)
Albumin: 4.9 g/dL (ref 3.5–5.0)
Alkaline Phosphatase: 568 U/L — ABNORMAL HIGH (ref 38–126)
Anion gap: 13 (ref 5–15)
BUN: 83 mg/dL — ABNORMAL HIGH (ref 8–23)
CO2: 28 mmol/L (ref 22–32)
Calcium: 8.8 mg/dL — ABNORMAL LOW (ref 8.9–10.3)
Chloride: 99 mmol/L (ref 98–111)
Creatinine: 8.77 mg/dL (ref 0.44–1.00)
GFR, Estimated: 4 mL/min — ABNORMAL LOW (ref >60)
Glucose, Bld: 194 mg/dL — ABNORMAL HIGH (ref 70–99)
Potassium: 5.1 mmol/L (ref 3.5–5.1)
Sodium: 140 mmol/L (ref 135–145)
Total Bilirubin: 0.5 mg/dL (ref 0.0–1.2)
Total Protein: 7.5 g/dL (ref 6.5–8.1)

## 2023-04-19 LAB — CBC WITH DIFFERENTIAL (CANCER CENTER ONLY)
Abs Immature Granulocytes: 0.02 10*3/uL (ref 0.00–0.07)
Basophils Absolute: 0 10*3/uL (ref 0.0–0.1)
Basophils Relative: 0 %
Eosinophils Absolute: 0.1 10*3/uL (ref 0.0–0.5)
Eosinophils Relative: 1 %
HCT: 39 % (ref 36.0–46.0)
Hemoglobin: 12 g/dL (ref 12.0–15.0)
Immature Granulocytes: 0 %
Lymphocytes Relative: 25 %
Lymphs Abs: 1.2 10*3/uL (ref 0.7–4.0)
MCH: 27.6 pg (ref 26.0–34.0)
MCHC: 30.8 g/dL (ref 30.0–36.0)
MCV: 89.9 fL (ref 80.0–100.0)
Monocytes Absolute: 0.3 10*3/uL (ref 0.1–1.0)
Monocytes Relative: 5 %
Neutro Abs: 3.2 10*3/uL (ref 1.7–7.7)
Neutrophils Relative %: 69 %
Platelet Count: 300 10*3/uL (ref 150–400)
RBC: 4.34 MIL/uL (ref 3.87–5.11)
RDW: 15.5 % (ref 11.5–15.5)
WBC Count: 4.8 10*3/uL (ref 4.0–10.5)
nRBC: 0 % (ref 0.0–0.2)

## 2023-04-19 LAB — LACTATE DEHYDROGENASE: LDH: 209 U/L — ABNORMAL HIGH (ref 98–192)

## 2023-04-19 NOTE — Progress Notes (Unsigned)
 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 No M protein on IFE 10/22/2022: Hgb 8.9, MCV 98, Plt 281, Cr 5.76 11/04/2022: establish care with Dr. Leonides Davis   Interval History:  Sara Davis 73 y.o. female with medical history significant for renal dysfunction, anemia, and smoldering multiple myeloma who presents for a follow up visit. The patient's last visit was on 11/04/2022 at which time she established care. In the interim since the last visit she is undergone a bone survey, bone marrow biopsy, and extensive blood work.  On exam today Sara Davis is accompanied by her son.  She reports she has been well overall interim since her last visit.  She reports her energy is about a 6 out of 10 today.  She notes that she is not having any recent lightheadedness, dizziness, or shortness of breath.  Her appetite is good and she is eating well.  She has not been having any trouble with bleeding, bruising, or dark stools.  She reports that she always has pain in her joints but does not have any new bone or Davis pain.  She reports she does feel like she is walking better though she does have "good and bad days".  She does have some chronic pain in her left shoulder.  She has had no recent viral illnesses such as runny nose, sore throat, cough.  She has not been having any trouble with fevers, chills, sweats.  Full 10 point ROS is otherwise negative.    MEDICAL HISTORY:  Past Medical History:  Diagnosis Date   Anemia    Cellulitis of scalp 02/2015   Chronic kidney disease 01/20/2021   stage V   Depression    Diabetes mellitus  without complication (HCC)    History of blood transfusion 11/2022   Hyperlipidemia    Hyperparathyroidism (HCC)    Hypertension     SURGICAL HISTORY: Past Surgical History:  Procedure Laterality Date   ABDOMINAL HYSTERECTOMY     AV FISTULA PLACEMENT Right 02/05/2021   Procedure: INSERTION OF RIGHT ARM ARTERIOVENOUS (AV) GORE-TEX GRAFT;  Surgeon: Sara Douglas, Sara Davis;  Location: [facility] OR;  Service: Vascular;  Laterality: Right;   BIOPSY  01/07/2018   Procedure: BIOPSY;  Surgeon: Sara Fredrickson, Sara Davis;  Location: [facility] ENDOSCOPY;  Service: Endoscopy;;  Clotest   COLONOSCOPY WITH PROPOFOL N/A 01/07/2018   Procedure: COLONOSCOPY WITH PROPOFOL;  Surgeon: Sara Fredrickson, Sara Davis;  Location: [facility] ENDOSCOPY;  Service: Endoscopy;  Laterality: N/A;   ENTEROSCOPY N/A 06/17/2022   Procedure: ENTEROSCOPY;  Surgeon: Sara Rist, Sara Davis;  Location: [facility] ENDOSCOPY;  Service: Gastroenterology;  Laterality: N/A;   ENTEROSCOPY N/A 10/20/2022   Procedure: ENTEROSCOPY;  Surgeon: Sara Lucks, Sara Davis;  Location: [facility] ENDOSCOPY;  Service: Gastroenterology;  Laterality: N/A;   ESOPHAGOGASTRODUODENOSCOPY (EGD) WITH PROPOFOL N/A 01/07/2018   Procedure: ESOPHAGOGASTRODUODENOSCOPY (EGD) WITH PROPOFOL;  Surgeon: Sara Fredrickson, Sara Davis;  Location: [facility] ENDOSCOPY;  Service: Endoscopy;  Laterality: N/A;   ESOPHAGOGASTRODUODENOSCOPY (EGD) WITH PROPOFOL N/A 12/14/2018   Procedure: ESOPHAGOGASTRODUODENOSCOPY (EGD) WITH PROPOFOL;  Surgeon: Sara Fredrickson, Sara Davis;  Location: [facility] ENDOSCOPY;  Service: Endoscopy;  Laterality: N/A;   GIVENS CAPSULE STUDY N/A 10/18/2022  Procedure: GIVENS CAPSULE STUDY;  Surgeon: Sara Form, Sara Davis;  Location: [facility] ENDOSCOPY;  Service: Gastroenterology;  Laterality: N/A;  Sara Davis   HEMOSTASIS CLIP PLACEMENT  06/17/2022   Procedure: HEMOSTASIS CLIP PLACEMENT;  Surgeon: Sara Rist, Sara Davis;  Location: [facility] ENDOSCOPY;  Service: Gastroenterology;;   HEMOSTASIS CONTROL  12/14/2018   Procedure: HEMOSTASIS CONTROL;  Surgeon:  Sara Fredrickson, Sara Davis;  Location: [facility] ENDOSCOPY;  Service: Endoscopy;;   HOT HEMOSTASIS N/A 06/17/2022   Procedure: HOT HEMOSTASIS (ARGON PLASMA COAGULATION/BICAP);  Surgeon: Sara Rist, Sara Davis;  Location: [facility] ENDOSCOPY;  Service: Gastroenterology;  Laterality: N/A;   HOT HEMOSTASIS N/A 10/20/2022   Procedure: HOT HEMOSTASIS (ARGON PLASMA COAGULATION/BICAP);  Surgeon: Sara Lucks, Sara Davis;  Location: [facility] ENDOSCOPY;  Service: Gastroenterology;  Laterality: N/A;   PARATHYROIDECTOMY Left 02/09/2023   Procedure: LEFT INFERIOR PARATHYROIDECTOMY;  Surgeon: Sara Level, Sara Davis;  Location: [facility] OR;  Service: General;  Laterality: Left;   POLYPECTOMY  01/07/2018   Procedure: POLYPECTOMY;  Surgeon: Sara Fredrickson, Sara Davis;  Location: [facility] ENDOSCOPY;  Service: Endoscopy;;   POLYPECTOMY  10/20/2022   Procedure: POLYPECTOMY;  Surgeon: Sara Lucks, Sara Davis;  Location: [facility] ENDOSCOPY;  Service: Gastroenterology;;   Susa Day  06/17/2022   Procedure: Susa Day;  Surgeon: Sara Rist, Sara Davis;  Location: [facility] ENDOSCOPY;  Service: Gastroenterology;;    SOCIAL HISTORY: Social History   Socioeconomic History   Marital status: Single    Spouse name: Not on file   Number of children: 2   Years of education: Not on file   Highest education Davis: Not on file  Occupational History   Occupation: Retired  Tobacco Use   Smoking status: Every Day    Current packs/day: 0.50    Types: Cigarettes   Smokeless tobacco: Never  Vaping Use   Vaping status: Never Used  Substance and Sexual Activity   Alcohol use: No    Alcohol/week: 0.0 standard drinks of alcohol   Drug use: No   Sexual activity: Not on file  Other Topics Concern   Not on file  Social History Narrative   Not on file   Social Drivers of Health   Financial Resource Strain: Not on file  Food Insecurity: No Food Insecurity (02/10/2023)   Hunger Vital Sign    Worried About Running Out of Food in the Last Year: Never true    Ran Out of Food in the Last  Year: Never true  Transportation Needs: No Transportation Needs (02/12/2023)   PRAPARE - Transportation    Lack of Transportation (Medical): No    Lack of Transportation (Non-Medical): No  Physical Activity: Not on file  Stress: Not on file  Social Connections: Unknown (02/12/2023)   Social Connection and Isolation Panel [NHANES]    Frequency of Communication with Friends and Family: Three times a week    Frequency of Social Gatherings with Friends and Family: Three times a week    Attends Religious Services: Patient declined    Active Member of Clubs or Organizations: Patient declined    Attends Banker Meetings: Never    Marital Status: Divorced  Catering manager Violence: Not At Risk (02/10/2023)   Humiliation, Afraid, Rape, and Kick questionnaire    Fear of Current or Ex-Partner: No    Emotionally Abused: No    Physically Abused: No    Sexually Abused: No    FAMILY HISTORY: Family History  Problem Relation Age of Onset   Diabetes Mother    Cancer Mother  Diabetes Sister    Hypertension Sister    Cancer Sister 43       Bone   Diabetes Brother    Diabetes Sister    Diabetes Sister     ALLERGIES:  is allergic to aspirin.  MEDICATIONS:  Current Outpatient Medications  Medication Sig Dispense Refill   albuterol (VENTOLIN HFA) 108 (90 Base) MCG/ACT inhaler Inhale 1-2 puffs into the lungs every 6 (six) hours as needed for wheezing or shortness of breath. (Patient not taking: Reported on 02/09/2023)     atorvastatin (LIPITOR) 80 MG tablet Take 1 tablet (80 mg total) by mouth daily. 90 tablet 1   B Complex-C-Folic Acid (DIALYVITE TABLET) TABS Take 1 tablet by mouth daily.     calcitRIOL (ROCALTROL) 0.5 MCG capsule Take 4 capsules (2 mcg total) by mouth 2 (two) times daily. 240 capsule 6   calcium carbonate (OS-CAL - DOSED IN MG OF ELEMENTAL CALCIUM) 1250 (500 Ca) MG tablet Take 4 tablets (5,000 mg total) by mouth 4 (four) times daily. 480 tablet 6   capsaicin  (ZOSTRIX) 0.025 % cream Apply topically 3 (three) times daily as needed. Apply a small amount to knees three times daily as needed (Patient not taking: Reported on 02/09/2023) 60 g 0   carvedilol (COREG) 25 MG tablet Take 1 tablet (25 mg total) by mouth 2 (two) times daily with a meal. 180 tablet 3   folic acid (FOLVITE) 1 MG tablet Take 1 tablet (1 mg total) by mouth daily. (Patient not taking: Reported on 02/09/2023) 30 tablet 1   No current facility-administered medications for this visit.    REVIEW OF SYSTEMS:   Constitutional: ( - ) fevers, ( - )  chills , ( - ) night sweats Eyes: ( - ) blurriness of vision, ( - ) double vision, ( - ) watery eyes Ears, nose, mouth, throat, and face: ( - ) mucositis, ( - ) sore throat Respiratory: ( - ) cough, ( - ) dyspnea, ( - ) wheezes Cardiovascular: ( - ) palpitation, ( - ) chest discomfort, ( - ) lower extremity swelling Gastrointestinal:  ( - ) nausea, ( - ) heartburn, ( - ) change in bowel habits Skin: ( - ) abnormal skin rashes Lymphatics: ( - ) new lymphadenopathy, ( - ) easy bruising Neurological: ( - ) numbness, ( - ) tingling, ( - ) new weaknesses Behavioral/Psych: ( - ) mood change, ( - ) new changes  All other systems were reviewed with the patient and are negative.  PHYSICAL EXAMINATION: ECOG PERFORMANCE STATUS: 1 - Symptomatic but completely ambulatory  Vitals:   04/19/23 1055  BP: (!) 159/85  Pulse: 78  Resp: 14  Temp: 97.8 F (36.6 C)  SpO2: 100%    Filed Weights   04/19/23 1055  Weight: 119 lb 12.8 oz (54.3 kg)     GENERAL: Well-appearing elderly African-American female, alert, no distress and comfortable SKIN: skin color, texture, turgor are normal, no rashes or significant lesions EYES: conjunctiva are pink and non-injected, sclera clear LUNGS: clear to auscultation and percussion with normal breathing effort HEART: regular rate & rhythm and no murmurs and no lower extremity edema Musculoskeletal: no cyanosis of  digits and no clubbing  PSYCH: alert & oriented x 3, fluent speech NEURO: no focal motor/sensory deficits  LABORATORY DATA:  I have reviewed the data as listed    Latest Ref Rng & Units 04/20/2023   11:53 AM 04/19/2023   10:24 AM 03/13/2023  7:13 AM  CBC  WBC 4.0 - 10.5 K/uL 6.1  4.8  6.6   Hemoglobin 12.0 - 15.0 g/dL 47.8  29.5  62.1   Hematocrit 36.0 - 46.0 % 36.4  39.0  33.1   Platelets 150 - 400 K/uL 270  300  215        Latest Ref Rng & Units 04/20/2023   11:53 AM 04/19/2023   10:24 AM 03/13/2023    7:13 AM  CMP  Glucose 70 - 99 mg/dL 308  657  846   BUN 8 - 23 mg/dL 23  83  42   Creatinine 0.44 - 1.00 mg/dL 9.62  9.52  8.41   Sodium 135 - 145 mmol/L 134  140  133   Potassium 3.5 - 5.1 mmol/L 3.6  5.1  4.5   Chloride 98 - 111 mmol/L 97  99  93   CO2 22 - 32 mmol/L 27  28  26    Calcium 8.9 - 10.3 mg/dL 8.5  8.8  8.5   Total Protein 6.5 - 8.1 g/dL  7.5    Total Bilirubin 0.0 - 1.2 mg/dL  0.5    Alkaline Phos 38 - 126 U/L  568    AST 15 - 41 U/L  12    ALT 0 - 44 U/L  6      Lab Results  Component Value Date   MPROTEIN Not Observed 04/19/2023   MPROTEIN Not Observed 01/11/2023   MPROTEIN Not Observed 12/08/2022   Lab Results  Component Value Date   KPAFRELGTCHN 1,423.3 (H) 04/19/2023   KPAFRELGTCHN 1,517.4 (H) 01/11/2023   KPAFRELGTCHN 1,161.2 (H) 12/08/2022   LAMBDASER 69.9 (H) 04/19/2023   LAMBDASER 49.7 (H) 01/11/2023   LAMBDASER 54.4 (H) 12/08/2022   KAPLAMBRATIO 20.36 (H) 04/19/2023   KAPLAMBRATIO 30.53 (H) 01/11/2023   KAPLAMBRATIO 21.35 (H) 12/08/2022    RADIOGRAPHIC STUDIES: No results found.  ASSESSMENT & PLAN KAYLON LAROCHE 73 y.o. female with medical history significant for renal dysfunction, anemia, and smoldering multiple myeloma who presents for a follow up visit.  This patient has a complex set of results which are difficult to interpret.  At this time we know the patient has at least smoldering multiple myeloma, however it is possible she  has multiple myeloma.  The 2 possibilities are:   1) the patient has multiple myeloma which is the source of her renal dysfunction and anemia. 2) her renal dysfunction is due to a different etiology and is causing her anemia.  In this case she would have incidental an indolent smoldering myeloma.  Discussed with nephrology who recommended avoiding kidney biopsy as results would not likely be helpful given the chronicity of her CKD. Will continue to monitor.   # Smoldering Multiple Myeloma.  # Elevated Serum Free Light Chain Ratio with No M protein --At this time the patient has at least smoldering multiple myeloma - metastatic bone survey was inconclusive but most consistent with hyperparathyroidism. --Bone marrow biopsy revealed over 30% plasma cells, however in the absence of endorgan damage this represents smoldering myeloma. --discussed the need for a biopsy of the kidney to determine if the etiology of the patient's kidney dysfunction is the elevated serum light chains. Nephrology recommended against biopsy, did not think in setting of her CKD that results would be helpful.  --labs today show white blood cell 4.8, hemoglobin 12.0, MCV 89.9, platelets 300.  Patient is being treated with EPO.  --RTC in 3 months for labs and 6  months for clinic visit.   No orders of the defined types were placed in this encounter.   All questions were answered. The patient knows to call the clinic with any problems, questions or concerns.  A total of more than 30 minutes were spent on this encounter with face-to-face time and non-face-to-face time, including preparing to see the patient, ordering tests and/or medications, counseling the patient and coordination of care as outlined above.   Ulysees Barns, Sara Davis Department of Hematology/Oncology [facility] Phone: [phone] Pager: [phone] Email: [email or web address]  04/22/2023 1:12 PM

## 2023-04-19 NOTE — Telephone Encounter (Signed)
 Pt saw Dr Leonides Schanz today and is aware

## 2023-04-19 NOTE — Telephone Encounter (Signed)
 CRITICAL VALUE STICKER  CRITICAL VALUE: Creatinine 8.77  RECEIVER (on-site recipient of call): Sharlette Dense CMA  DATE & TIME NOTIFIED: 04/19/2023 1136  MESSENGER (representative from lab): Heather in lab  MD NOTIFIED: Santiago Glad NP  TIME OF NOTIFICATION: 1137  RESPONSE:  made provider aware

## 2023-04-20 ENCOUNTER — Encounter (HOSPITAL_COMMUNITY): Payer: Self-pay

## 2023-04-20 ENCOUNTER — Emergency Department (HOSPITAL_COMMUNITY)
Admission: EM | Admit: 2023-04-20 | Discharge: 2023-04-20 | Disposition: A | Discharge status: Home / Self Care | Payer: Medicare (Managed Care)

## 2023-04-20 ENCOUNTER — Other Ambulatory Visit: Payer: Self-pay

## 2023-04-20 DIAGNOSIS — Z79899 Other long term (current) drug therapy: Secondary | ICD-10-CM | POA: Insufficient documentation

## 2023-04-20 DIAGNOSIS — N186 End stage renal disease: Secondary | ICD-10-CM | POA: Insufficient documentation

## 2023-04-20 DIAGNOSIS — Z992 Dependence on renal dialysis: Secondary | ICD-10-CM | POA: Diagnosis not present

## 2023-04-20 DIAGNOSIS — I120 Hypertensive chronic kidney disease with stage 5 chronic kidney disease or end stage renal disease: Secondary | ICD-10-CM | POA: Insufficient documentation

## 2023-04-20 DIAGNOSIS — I959 Hypotension, unspecified: Secondary | ICD-10-CM | POA: Insufficient documentation

## 2023-04-20 LAB — CBC WITH DIFFERENTIAL/PLATELET
Abs Immature Granulocytes: 0.02 10*3/uL (ref 0.00–0.07)
Basophils Absolute: 0 10*3/uL (ref 0.0–0.1)
Basophils Relative: 0 %
Eosinophils Absolute: 0 10*3/uL (ref 0.0–0.5)
Eosinophils Relative: 1 %
HCT: 36.4 % (ref 36.0–46.0)
Hemoglobin: 11.4 g/dL — ABNORMAL LOW (ref 12.0–15.0)
Immature Granulocytes: 0 %
Lymphocytes Relative: 21 %
Lymphs Abs: 1.3 10*3/uL (ref 0.7–4.0)
MCH: 28.6 pg (ref 26.0–34.0)
MCHC: 31.3 g/dL (ref 30.0–36.0)
MCV: 91.5 fL (ref 80.0–100.0)
Monocytes Absolute: 0.3 10*3/uL (ref 0.1–1.0)
Monocytes Relative: 5 %
Neutro Abs: 4.4 10*3/uL (ref 1.7–7.7)
Neutrophils Relative %: 73 %
Platelets: 270 10*3/uL (ref 150–400)
RBC: 3.98 MIL/uL (ref 3.87–5.11)
RDW: 15.5 % (ref 11.5–15.5)
WBC: 6.1 10*3/uL (ref 4.0–10.5)
nRBC: 0 % (ref 0.0–0.2)

## 2023-04-20 LAB — KAPPA/LAMBDA LIGHT CHAINS
Kappa free light chain: 1423.3 mg/L — ABNORMAL HIGH (ref 3.3–19.4)
Kappa, lambda light chain ratio: 20.36 — ABNORMAL HIGH (ref 0.26–1.65)
Lambda free light chains: 69.9 mg/L — ABNORMAL HIGH (ref 5.7–26.3)

## 2023-04-20 LAB — BASIC METABOLIC PANEL
Anion gap: 10 (ref 5–15)
BUN: 23 mg/dL (ref 8–23)
CO2: 27 mmol/L (ref 22–32)
Calcium: 8.5 mg/dL — ABNORMAL LOW (ref 8.9–10.3)
Chloride: 97 mmol/L — ABNORMAL LOW (ref 98–111)
Creatinine, Ser: 4.02 mg/dL — ABNORMAL HIGH (ref 0.44–1.00)
GFR, Estimated: 11 mL/min — ABNORMAL LOW (ref >60)
Glucose, Bld: 242 mg/dL — ABNORMAL HIGH (ref 70–99)
Potassium: 3.6 mmol/L (ref 3.5–5.1)
Sodium: 134 mmol/L — ABNORMAL LOW (ref 135–145)

## 2023-04-20 NOTE — ED Provider Notes (Signed)
 [place] EMERGENCY DEPARTMENT AT [facility] Provider Note   CSN: [id number] Arrival date & time: 04/20/23  1126     History  No chief complaint on file.   Sara Davis is a 73 y.o. female.  73 year old female with past medical history of hypertension hyperlipidemia as well as end-stage renal disease on dialysis Tuesdays, Thursdays, and Saturdays presenting to the emergency department today with hypotension at dialysis.  The patient did not eat dinner last night or breakfast this morning and went to dialysis.  She states she was feeling well before dialysis but in the middle of dialysis became lightheaded.  The patient was noted to be hypotensive after dialysis and was told to come to the ER for further evaluation.  She denies any chest pain or shortness of breath with this.  She states that since she has been here she is has something to eat and is feeling a lot better.  She is requesting discharge.  She states that she has had some intermittent hypotension with dialysis but is the first time that is lasted this long.        Home Medications Prior to Admission medications   Medication Sig Start Date End Date Taking? Authorizing Provider  albuterol (VENTOLIN HFA) 108 (90 Base) MCG/ACT inhaler Inhale 1-2 puffs into the lungs every 6 (six) hours as needed for wheezing or shortness of breath. Patient not taking: Reported on 02/09/2023 10/27/22   [provider]  atorvastatin (LIPITOR) 80 MG tablet Take 1 tablet (80 mg total) by mouth daily. 12/20/18   Melene Plan, MD  B Complex-C-Folic Acid (DIALYVITE TABLET) TABS Take 1 tablet by mouth daily. 03/05/22   [provider]  calcitRIOL (ROCALTROL) 0.5 MCG capsule Take 4 capsules (2 mcg total) by mouth 2 (two) times daily. 02/11/23   Julien Nordmann, PA-C  calcium carbonate (OS-CAL - DOSED IN MG OF ELEMENTAL CALCIUM) 1250 (500 Ca) MG tablet Take 4 tablets (5,000 mg total) by mouth 4 (four) times daily. 02/12/23    Julien Nordmann, PA-C  capsaicin (ZOSTRIX) 0.025 % cream Apply topically 3 (three) times daily as needed. Apply a small amount to knees three times daily as needed Patient not taking: Reported on 02/09/2023 10/20/22   Meryl Dare, MD  carvedilol (COREG) 25 MG tablet Take 1 tablet (25 mg total) by mouth 2 (two) times daily with a meal. 05/09/19   Royal Hawthorn, NP  folic acid (FOLVITE) 1 MG tablet Take 1 tablet (1 mg total) by mouth daily. Patient not taking: Reported on 02/09/2023 10/20/22   Meryl Dare, MD      Allergies    Aspirin    Review of Systems   Review of Systems  Neurological:  Positive for light-headedness.  All other systems reviewed and are negative.   Physical Exam Updated Vital Signs BP 110/62 (BP Location: Left Arm)   Pulse 92   Temp 98.3 F (36.8 C)   Resp 18   Ht 5\' 6"  (1.676 m)   Wt 54.3 kg   SpO2 100%   BMI 19.32 kg/m  Physical Exam Vitals and nursing note reviewed.   Gen: NAD Eyes: PERRL, EOMI HEENT: no oropharyngeal swelling Neck: trachea midline Resp: clear to auscultation bilaterally Card: RRR, no murmurs, rubs, or gallops Abd: nontender, nondistended Extremities: no calf tenderness, no edema Vascular: 2+ radial pulses bilaterally, 2+ DP pulses bilaterally Neuro: No focal deficits Skin: no rashes Psyc: acting appropriately   ED Results / Procedures /  Treatments   Labs (all labs ordered are listed, but only abnormal results are displayed) Labs Reviewed  BASIC METABOLIC PANEL - Abnormal; Notable for the following components:      Result Value   Sodium 134 (*)    Chloride 97 (*)    Glucose, Bld 242 (*)    Creatinine, Ser 4.02 (*)    Calcium 8.5 (*)    GFR, Estimated 11 (*)    All other components within normal limits  CBC WITH DIFFERENTIAL/PLATELET - Abnormal; Notable for the following components:   Hemoglobin 11.4 (*)    All other components within normal limits    EKG EKG Interpretation Date/Time:  Tuesday April 20 2023  19:23:28 EDT Ventricular Rate:  85 PR Interval:  133 QRS Duration:  96 QT Interval:  413 QTC Calculation: 492 R Axis:   -31  Text Interpretation: Sinus rhythm LVH with secondary repolarization abnormality QTC < 500 Confirmed by Beckey Downing [phone]) on 04/20/2023 7:29:46 PM  Radiology No results found.  Procedures Procedures    Medications Ordered in ED Medications - No data to display  ED Course/ Medical Decision Making/ A&P                                 Medical Decision Making 73 year old female with past medical history of hypertension hyperlipidemia as well as end-stage renal disease on dialysis presenting to the emergency department today with lightheadedness after dialysis.  This in the setting of missing 2 meals.  The patient states that she is back to her baseline.  Given her improvement in her symptoms and no history of chest pain or shortness of breath suspicion for ACS or pulmonary embolism is low at this time.  I will obtain basic labs here to eval for anemia or electrolyte abnormalities.  Also obtain EKG to evaluate for conduction abnormalities.  I think that if her EKG is unremarkable and her labs are reassuring that she can potentially be discharged she is able to ambulate without any more lightheadedness.  The patient's EKG interpreted by me shows a sinus rhythm with a rate of 85 with LVH and repolarization changes with some mild ST depressions in the inferior and lateral leads.  This does appear similar in morphology to her previous EKG.  QTc is less than 500.  It looks that this is a chronic issue and usually is greater than 500.  The patient is ambulatory here with a steady gait and is back to her baseline.  I think that she is stable for discharge.  Amount and/or Complexity of Data Reviewed Labs: ordered.           Final Clinical Impression(s) / ED Diagnoses Final diagnoses:  Hypotension, unspecified hypotension type  Dialysis patient [facility])    Rx / DC  Orders ED Discharge Orders     None         Durwin Glaze, MD 04/20/23 1932

## 2023-04-20 NOTE — ED Notes (Signed)
 PT ambulated to the nurses station and back without difficulty, no nausea and no dizziness. Tech is bedside  doing EKG.

## 2023-04-20 NOTE — Discharge Instructions (Signed)
 Your labs and EKG were reassuring.  Please follow-up with your doctor for reevaluation.  Make sure not to skip any meals before dialysis.  Please return to the ER for worsening symptoms.  Please discuss with your nephrologist if they like to change her medications around dialysis if this becomes a recurrent issue.

## 2023-04-20 NOTE — ED Triage Notes (Signed)
 Pt c/o hypotension at dialysis. Pt states she wasn't told how low it was and told her to come to ED. Pt states she was dizzy at dialysis, but it has resolved.

## 2023-04-21 LAB — MULTIPLE MYELOMA PANEL, SERUM
Albumin SerPl Elph-Mcnc: 3.9 g/dL (ref 2.9–4.4)
Albumin/Glob SerPl: 1.4 (ref 0.7–1.7)
Alpha 1: 0.3 g/dL (ref 0.0–0.4)
Alpha2 Glob SerPl Elph-Mcnc: 1 g/dL (ref 0.4–1.0)
B-Globulin SerPl Elph-Mcnc: 1 g/dL (ref 0.7–1.3)
Gamma Glob SerPl Elph-Mcnc: 0.5 g/dL (ref 0.4–1.8)
Globulin, Total: 2.8 g/dL (ref 2.2–3.9)
IgA: 56 mg/dL — ABNORMAL LOW (ref 64–422)
IgG (Immunoglobin G), Serum: 487 mg/dL — ABNORMAL LOW (ref 586–1602)
IgM (Immunoglobulin M), Srm: 15 mg/dL — ABNORMAL LOW (ref 26–217)
Total Protein ELP: 6.7 g/dL (ref 6.0–8.5)

## 2023-04-22 ENCOUNTER — Encounter (HOSPITAL_COMMUNITY): Payer: Self-pay

## 2023-05-25 ENCOUNTER — Other Ambulatory Visit: Payer: Self-pay

## 2023-05-25 ENCOUNTER — Emergency Department (HOSPITAL_COMMUNITY): Payer: Medicare (Managed Care)

## 2023-05-25 ENCOUNTER — Encounter (HOSPITAL_COMMUNITY): Payer: Self-pay | Admitting: Internal Medicine

## 2023-05-25 ENCOUNTER — Inpatient Hospital Stay (HOSPITAL_COMMUNITY)
Admission: EM | Admit: 2023-05-25 | Discharge: 2023-05-28 | DRG: 377 | Disposition: A | Discharge status: Home / Self Care | Payer: Medicare (Managed Care) | Attending: Internal Medicine | Admitting: Internal Medicine

## 2023-05-25 DIAGNOSIS — T182XXA Foreign body in stomach, initial encounter: Secondary | ICD-10-CM | POA: Diagnosis present

## 2023-05-25 DIAGNOSIS — I2489 Other forms of acute ischemic heart disease: Secondary | ICD-10-CM | POA: Diagnosis present

## 2023-05-25 DIAGNOSIS — K922 Gastrointestinal hemorrhage, unspecified: Secondary | ICD-10-CM | POA: Diagnosis not present

## 2023-05-25 DIAGNOSIS — E785 Hyperlipidemia, unspecified: Secondary | ICD-10-CM | POA: Diagnosis present

## 2023-05-25 DIAGNOSIS — M898X9 Other specified disorders of bone, unspecified site: Secondary | ICD-10-CM | POA: Diagnosis present

## 2023-05-25 DIAGNOSIS — D649 Anemia, unspecified: Secondary | ICD-10-CM | POA: Diagnosis not present

## 2023-05-25 DIAGNOSIS — Z604 Social exclusion and rejection: Secondary | ICD-10-CM | POA: Diagnosis present

## 2023-05-25 DIAGNOSIS — Z8249 Family history of ischemic heart disease and other diseases of the circulatory system: Secondary | ICD-10-CM

## 2023-05-25 DIAGNOSIS — I4891 Unspecified atrial fibrillation: Secondary | ICD-10-CM | POA: Diagnosis not present

## 2023-05-25 DIAGNOSIS — Z992 Dependence on renal dialysis: Secondary | ICD-10-CM

## 2023-05-25 DIAGNOSIS — Z886 Allergy status to analgesic agent status: Secondary | ICD-10-CM

## 2023-05-25 DIAGNOSIS — K31811 Angiodysplasia of stomach and duodenum with bleeding: Principal | ICD-10-CM | POA: Diagnosis present

## 2023-05-25 DIAGNOSIS — E1122 Type 2 diabetes mellitus with diabetic chronic kidney disease: Secondary | ICD-10-CM | POA: Diagnosis present

## 2023-05-25 DIAGNOSIS — W44F1XA Bezoar entering into or through a natural orifice, initial encounter: Secondary | ICD-10-CM | POA: Diagnosis present

## 2023-05-25 DIAGNOSIS — N186 End stage renal disease: Secondary | ICD-10-CM | POA: Diagnosis present

## 2023-05-25 DIAGNOSIS — E213 Hyperparathyroidism, unspecified: Secondary | ICD-10-CM | POA: Diagnosis present

## 2023-05-25 DIAGNOSIS — Z90710 Acquired absence of both cervix and uterus: Secondary | ICD-10-CM

## 2023-05-25 DIAGNOSIS — I472 Ventricular tachycardia, unspecified: Secondary | ICD-10-CM | POA: Diagnosis present

## 2023-05-25 DIAGNOSIS — I120 Hypertensive chronic kidney disease with stage 5 chronic kidney disease or end stage renal disease: Secondary | ICD-10-CM | POA: Diagnosis present

## 2023-05-25 DIAGNOSIS — N2581 Secondary hyperparathyroidism of renal origin: Secondary | ICD-10-CM | POA: Diagnosis present

## 2023-05-25 DIAGNOSIS — F17210 Nicotine dependence, cigarettes, uncomplicated: Secondary | ICD-10-CM | POA: Diagnosis present

## 2023-05-25 DIAGNOSIS — E872 Acidosis, unspecified: Secondary | ICD-10-CM | POA: Diagnosis present

## 2023-05-25 DIAGNOSIS — Z833 Family history of diabetes mellitus: Secondary | ICD-10-CM

## 2023-05-25 DIAGNOSIS — D472 Monoclonal gammopathy: Secondary | ICD-10-CM | POA: Diagnosis present

## 2023-05-25 DIAGNOSIS — Z860101 Personal history of adenomatous and serrated colon polyps: Secondary | ICD-10-CM

## 2023-05-25 DIAGNOSIS — I953 Hypotension of hemodialysis: Secondary | ICD-10-CM | POA: Diagnosis present

## 2023-05-25 DIAGNOSIS — Z79899 Other long term (current) drug therapy: Secondary | ICD-10-CM

## 2023-05-25 DIAGNOSIS — I4892 Unspecified atrial flutter: Secondary | ICD-10-CM | POA: Diagnosis not present

## 2023-05-25 DIAGNOSIS — E876 Hypokalemia: Secondary | ICD-10-CM | POA: Diagnosis present

## 2023-05-25 LAB — COMPREHENSIVE METABOLIC PANEL WITH GFR
ALT: 8 U/L (ref 0–44)
AST: 16 U/L (ref 15–41)
Albumin: 3.4 g/dL — ABNORMAL LOW (ref 3.5–5.0)
Alkaline Phosphatase: 387 U/L — ABNORMAL HIGH (ref 38–126)
Anion gap: 13 (ref 5–15)
BUN: 11 mg/dL (ref 8–23)
CO2: 28 mmol/L (ref 22–32)
Calcium: 7.9 mg/dL — ABNORMAL LOW (ref 8.9–10.3)
Chloride: 94 mmol/L — ABNORMAL LOW (ref 98–111)
Creatinine, Ser: 2.39 mg/dL — ABNORMAL HIGH (ref 0.44–1.00)
GFR, Estimated: 21 mL/min — ABNORMAL LOW (ref >60)
Glucose, Bld: 221 mg/dL — ABNORMAL HIGH (ref 70–99)
Potassium: 3.2 mmol/L — ABNORMAL LOW (ref 3.5–5.1)
Sodium: 135 mmol/L (ref 135–145)
Total Bilirubin: 0.4 mg/dL (ref 0.0–1.2)
Total Protein: 6.2 g/dL — ABNORMAL LOW (ref 6.5–8.1)

## 2023-05-25 LAB — I-STAT CHEM 8, ED
BUN: 11 mg/dL (ref 8–23)
Calcium, Ion: 0.9 mmol/L — ABNORMAL LOW (ref 1.15–1.40)
Chloride: 91 mmol/L — ABNORMAL LOW (ref 98–111)
Creatinine, Ser: 2.5 mg/dL — ABNORMAL HIGH (ref 0.44–1.00)
Glucose, Bld: 219 mg/dL — ABNORMAL HIGH (ref 70–99)
HCT: 21 % — ABNORMAL LOW (ref 36.0–46.0)
Hemoglobin: 7.1 g/dL — ABNORMAL LOW (ref 12.0–15.0)
Potassium: 3.1 mmol/L — ABNORMAL LOW (ref 3.5–5.1)
Sodium: 136 mmol/L (ref 135–145)
TCO2: 31 mmol/L (ref 22–32)

## 2023-05-25 LAB — LACTIC ACID, PLASMA: Lactic Acid, Venous: 0.9 mmol/L (ref 0.5–1.9)

## 2023-05-25 LAB — POC OCCULT BLOOD, ED: Fecal Occult Bld: POSITIVE — AB

## 2023-05-25 LAB — CBC
HCT: 23.2 % — ABNORMAL LOW (ref 36.0–46.0)
Hemoglobin: 7.2 g/dL — ABNORMAL LOW (ref 12.0–15.0)
MCH: 28 pg (ref 26.0–34.0)
MCHC: 31 g/dL (ref 30.0–36.0)
MCV: 90.3 fL (ref 80.0–100.0)
Platelets: 277 10*3/uL (ref 150–400)
RBC: 2.57 MIL/uL — ABNORMAL LOW (ref 3.87–5.11)
RDW: 14.5 % (ref 11.5–15.5)
WBC: 6.4 10*3/uL (ref 4.0–10.5)
nRBC: 0 % (ref 0.0–0.2)

## 2023-05-25 LAB — TROPONIN I (HIGH SENSITIVITY)
Troponin I (High Sensitivity): 25 ng/L — ABNORMAL HIGH (ref <18)
Troponin I (High Sensitivity): 53 ng/L — ABNORMAL HIGH (ref <18)
Troponin I (High Sensitivity): 122 ng/L (ref <18)

## 2023-05-25 LAB — GLUCOSE, CAPILLARY: Glucose-Capillary: 253 mg/dL — ABNORMAL HIGH (ref 70–99)

## 2023-05-25 LAB — I-STAT CG4 LACTIC ACID, ED: Lactic Acid, Venous: 2 mmol/L (ref 0.5–1.9)

## 2023-05-25 LAB — MAGNESIUM: Magnesium: 2.4 mg/dL (ref 1.7–2.4)

## 2023-05-25 MED ORDER — SODIUM CHLORIDE 0.9 % IV BOLUS: 500.0000 mL | Freq: Once | INTRAVENOUS | Status: DC

## 2023-05-25 MED ORDER — PANTOPRAZOLE SODIUM 40 MG IV SOLR
40.0000 mg | Freq: Two times a day (BID) | INTRAVENOUS | Status: DC
  Administered 2023-05-26 – 2023-05-27 (×3): 40 mg via INTRAVENOUS
  Filled 2023-05-25 (×3): qty 10

## 2023-05-25 MED ORDER — ATORVASTATIN CALCIUM 80 MG PO TABS
80.0000 mg | ORAL_TABLET | Freq: Every day | ORAL | Status: DC
  Administered 2023-05-26 – 2023-05-28 (×2): 80 mg via ORAL
  Filled 2023-05-25 (×2): qty 1

## 2023-05-25 MED ORDER — CALCITRIOL 0.5 MCG PO CAPS
2.0000 ug | ORAL_CAPSULE | Freq: Two times a day (BID) | ORAL | Status: DC
  Administered 2023-05-25 – 2023-05-28 (×5): 2 ug via ORAL
  Filled 2023-05-25 (×7): qty 4

## 2023-05-25 MED ORDER — CALCIUM CARBONATE 1250 (500 CA) MG PO TABS
4.0000 | ORAL_TABLET | Freq: Three times a day (TID) | ORAL | Status: DC
  Administered 2023-05-25 – 2023-05-28 (×8): 5000 mg via ORAL
  Filled 2023-05-25 (×8): qty 4

## 2023-05-25 MED ORDER — CARVEDILOL 25 MG PO TABS
25.0000 mg | ORAL_TABLET | Freq: Two times a day (BID) | ORAL | Status: DC
  Administered 2023-05-25 – 2023-05-27 (×3): 25 mg via ORAL
  Filled 2023-05-25: qty 2
  Filled 2023-05-25 (×4): qty 1

## 2023-05-25 MED ORDER — PANTOPRAZOLE SODIUM 40 MG IV SOLR
40.0000 mg | Freq: Once | INTRAVENOUS | Status: AC
  Administered 2023-05-25: 40 mg via INTRAVENOUS
  Filled 2023-05-25: qty 10

## 2023-05-25 MED ORDER — ACETAMINOPHEN 325 MG PO TABS: 650.0000 mg | ORAL_TABLET | Freq: Four times a day (QID) | ORAL | Status: DC | PRN

## 2023-05-25 MED ORDER — DILTIAZEM LOAD VIA INFUSION
10.0000 mg | Freq: Once | INTRAVENOUS | Status: DC
  Filled 2023-05-25: qty 10

## 2023-05-25 MED ORDER — SODIUM CHLORIDE 0.9 % IV BOLUS
500.0000 mL | Freq: Once | INTRAVENOUS | Status: AC
  Administered 2023-05-25: 500 mL via INTRAVENOUS

## 2023-05-25 MED ORDER — POTASSIUM CHLORIDE CRYS ER 20 MEQ PO TBCR
40.0000 meq | EXTENDED_RELEASE_TABLET | Freq: Once | ORAL | Status: AC
Start: 2023-05-25 — End: 2023-05-25
  Administered 2023-05-25: 40 meq via ORAL
  Filled 2023-05-25: qty 2

## 2023-05-25 MED ORDER — SODIUM CHLORIDE 0.9 % IV BOLUS
250.0000 mL | Freq: Once | INTRAVENOUS | Status: AC
  Administered 2023-05-25: 250 mL via INTRAVENOUS

## 2023-05-25 MED ORDER — ALBUTEROL SULFATE (2.5 MG/3ML) 0.083% IN NEBU: 2.5000 mg | INHALATION_SOLUTION | Freq: Four times a day (QID) | RESPIRATORY_TRACT | Status: DC | PRN

## 2023-05-25 MED ORDER — ACETAMINOPHEN 650 MG RE SUPP: 650.0000 mg | Freq: Four times a day (QID) | RECTAL | Status: DC | PRN

## 2023-05-25 MED ORDER — DILTIAZEM HCL-DEXTROSE 125-5 MG/125ML-% IV SOLN (PREMIX)
5.0000 mg/h | INTRAVENOUS | Status: DC
  Filled 2023-05-25: qty 125

## 2023-05-25 MED ORDER — RENA-VITE PO TABS
1.0000 | ORAL_TABLET | Freq: Every day | ORAL | Status: DC
Start: 2023-05-26 — End: 2023-05-28
  Administered 2023-05-26 – 2023-05-27 (×2): 1 via ORAL
  Filled 2023-05-25 (×2): qty 1

## 2023-05-25 NOTE — ED Triage Notes (Addendum)
 Pt went to dialysis today and while at dialysis pt became very weak and hypotensive. NVD. Pt able to finish dialysis but by lost control of bowel and bladder due to weakness. Pt has been feeling fine until today. Denies CP SOB. Generalized weakness began this mornin when waking up at 0500. LKW 05/24/23 2100

## 2023-05-25 NOTE — H&P (Signed)
 Date: 05/25/2023               Patient Name:  Sara Davis MRN: [id number]  DOB: [birth year] Age / Sex: 73 y.o., female   PCP: Hershell Lose, NP         Medical Service: Internal Medicine Teaching Service         Attending Physician: Dr. Cherylene Corrente, MD      First Contact: Dr. 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 While receiving HD today she felt very weak, lightheaded and nauseous. She often feels weak and is hypotensive during dialysis. She has had poor po intake recently due to chronically poor appetite. She reports recently having very dark stools, which she has also had in the past. No recently missed HD sessions. Denies chest pain, shortness of breath, recent illness, dysuria, hematuria, constipation, diarrhea.   ED Course: Afeb, hypotensive to 74/56, Afib with RVR to 170s which converted to NSR with IV fluids. BP improved to 110s systolic Hgb 7.1 (previous 11 from 04/2023), no leukocytosis Trop 25 ->53 Lactate 2.0 FOBT positive CXR: R middle lobe pneumonia  Past Medical History: Past Medical History:  Diagnosis Date   Anemia    Cellulitis of scalp 02/2015   Chronic kidney disease 01/20/2021   stage V   Depression    Diabetes mellitus without complication (HCC)    History of blood transfusion 11/2022   Hyperlipidemia    Hyperparathyroidism (HCC)    Hypertension    Meds:  Albuterol  Lipitor   Dilavite vitamin Calcitriol  Calcium  Coreg  25 mg bid  Folic acid  -- not taking   Past Surgical History:  Procedure Laterality Date    ABDOMINAL HYSTERECTOMY     AV FISTULA PLACEMENT Right 02/05/2021   Procedure: INSERTION OF RIGHT ARM ARTERIOVENOUS (AV) GORE-TEX GRAFT;  Surgeon: Carlene Che, MD;  Location: [facility] OR;  Service: Vascular;  Laterality: Right;   BIOPSY  01/07/2018   Procedure: BIOPSY;  Surgeon: Tobin Forts, MD;  Location: [facility] ENDOSCOPY;  Service: Endoscopy;;  Clotest   COLONOSCOPY WITH PROPOFOL  N/A 01/07/2018   Procedure: COLONOSCOPY WITH PROPOFOL ;  Surgeon: Tobin Forts, MD;  Location: [facility] ENDOSCOPY;  Service: Endoscopy;  Laterality: N/A;   ENTEROSCOPY N/A 06/17/2022   Procedure: ENTEROSCOPY;  Surgeon: Albertina Hugger, MD;  Location: [facility] ENDOSCOPY;  Service: Gastroenterology;  Laterality: N/A;   ENTEROSCOPY N/A 10/20/2022   Procedure: ENTEROSCOPY;  Surgeon: Elois Hair, MD;  Location: [facility] ENDOSCOPY;  Service: Gastroenterology;  Laterality: N/A;   ESOPHAGOGASTRODUODENOSCOPY (EGD) WITH PROPOFOL  N/A 01/07/2018   Procedure: ESOPHAGOGASTRODUODENOSCOPY (EGD) WITH PROPOFOL ;  Surgeon: Tobin Forts, MD;  Location: [facility] ENDOSCOPY;  Service: Endoscopy;  Laterality: N/A;   ESOPHAGOGASTRODUODENOSCOPY (EGD) WITH PROPOFOL  N/A 12/14/2018   Procedure: ESOPHAGOGASTRODUODENOSCOPY (EGD) WITH PROPOFOL ;  Surgeon: Tobin Forts, MD;  Location: [facility] ENDOSCOPY;  Service: Endoscopy;  Laterality: N/A;   GIVENS CAPSULE STUDY N/A 10/18/2022   Procedure: GIVENS CAPSULE STUDY;  Surgeon: Sergio Dandy, MD;  Location: [facility] ENDOSCOPY;  Service: Gastroenterology;  Laterality: N/A;  Per Mansouraty   HEMOSTASIS CLIP PLACEMENT  06/17/2022   Procedure: HEMOSTASIS CLIP PLACEMENT;  Surgeon: Albertina Hugger, MD;  Location: [facility] ENDOSCOPY;  Service: Gastroenterology;;   HEMOSTASIS CONTROL  12/14/2018   Procedure: HEMOSTASIS CONTROL;  Surgeon: Tobin Forts, MD;  Location: [facility] ENDOSCOPY;  Service: Endoscopy;;   HOT HEMOSTASIS N/A 06/17/2022   Procedure: HOT HEMOSTASIS (ARGON PLASMA COAGULATION/BICAP);  Surgeon: Albertina Hugger, MD;  Location: [facility]  ENDOSCOPY;  Service: Gastroenterology;  Laterality: N/A;   HOT HEMOSTASIS N/A 10/20/2022   Procedure: HOT HEMOSTASIS (ARGON PLASMA COAGULATION/BICAP);  Surgeon: Elois Hair, MD;  Location: [facility] ENDOSCOPY;  Service: Gastroenterology;  Laterality: N/A;   PARATHYROIDECTOMY Left 02/09/2023   Procedure: LEFT INFERIOR PARATHYROIDECTOMY;  Surgeon: Oralee Billow, MD;  Location: [facility] OR;  Service: General;  Laterality: Left;   POLYPECTOMY  01/07/2018   Procedure: POLYPECTOMY;  Surgeon: Tobin Forts, MD;  Location: [facility] ENDOSCOPY;  Service: Endoscopy;;   POLYPECTOMY  10/20/2022   Procedure: POLYPECTOMY;  Surgeon: Elois Hair, MD;  Location: [facility] ENDOSCOPY;  Service: Gastroenterology;;   Daryle Eon  06/17/2022   Procedure: Daryle Eon;  Surgeon: Albertina Hugger, MD;  Location: [facility] ENDOSCOPY;  Service: Gastroenterology;;   Social:  Living Situation: home with son. Independent in ADLs, son assists with iADLs PCP: Hershell Lose, NP Tobacco: 1/2-1 ppd for 50+ years Alcohol: none Drugs: none  Family History: noncontributory  Allergies: Allergies as of 05/25/2023 - Review Complete 05/25/2023  Allergen Reaction Noted   Aspirin  Other (See Comments) 01/14/2012   Review of Systems: A complete ROS was negative except as per HPI.   OBJECTIVE:   Physical Exam: Blood pressure (!) 113/39, pulse 83, resp. rate 20, height 5\' 6"  (1.676 m), weight 54.3 kg, SpO2 100%.  Constitutional: elderly female, low BMI, no acute distress Cardiovascular: RRR Pulmonary/Chest: CTAB Abdominal: soft, nontender, nondistended MSK: RUE fistula with palpable thrill Neurological: A&O x 3  Labs: CBC    Component Value Date/Time   WBC 6.4 05/25/2023 1253   RBC 2.57 (L) 05/25/2023 1253   HGB 7.1 (L) 05/25/2023 1305   HGB 12.0 04/19/2023 1024   HGB 10.5 (L) 05/09/2019 1139   HCT 21.0 (L) 05/25/2023 1305   HCT 31.8 (L) 05/09/2019 1139   PLT 277 05/25/2023 1253   PLT 300 04/19/2023 1024   PLT 347 05/09/2019 1139    MCV 90.3 05/25/2023 1253   MCV 85 05/09/2019 1139   MCH 28.0 05/25/2023 1253   MCHC 31.0 05/25/2023 1253   RDW 14.5 05/25/2023 1253   RDW 12.6 05/09/2019 1139   LYMPHSABS 1.3 04/20/2023 1153   LYMPHSABS 1.7 05/09/2019 1139   MONOABS 0.3 04/20/2023 1153   EOSABS 0.0 04/20/2023 1153   EOSABS 0.1 05/09/2019 1139   BASOSABS 0.0 04/20/2023 1153   BASOSABS 0.0 05/09/2019 1139    CMP     Component Value Date/Time   NA 136 05/25/2023 1305   NA 142 05/09/2019 1139   K 3.1 (L) 05/25/2023 1305   CL 91 (L) 05/25/2023 1305   CO2 28 05/25/2023 1253   GLUCOSE 219 (H) 05/25/2023 1305   BUN 11 05/25/2023 1305   BUN 23 05/09/2019 1139   CREATININE 2.50 (H) 05/25/2023 1305   CREATININE 8.77 (HH) 04/19/2023 1024   CREATININE 0.75 07/31/2013 1059   CALCIUM  7.9 (L) 05/25/2023 1253   PROT 6.2 (L) 05/25/2023 1253   PROT 6.3 05/09/2019 1139   ALBUMIN  3.4 (L) 05/25/2023 1253   ALBUMIN  4.5 05/09/2019 1139  AST 16 05/25/2023 1253   AST 12 (L) 04/19/2023 1024   ALT 8 05/25/2023 1253   ALT 6 04/19/2023 1024   ALKPHOS 387 (H) 05/25/2023 1253   BILITOT 0.4 05/25/2023 1253   BILITOT 0.5 04/19/2023 1024   GFRNONAA 21 (L) 05/25/2023 1253   GFRNONAA 4 (L) 04/19/2023 1024   GFRAA 18 (L) 05/09/2019 1139   GFRAA 15 (L) 12/23/2018 1110   Imaging: DG Chest Portable 1 View Result Date: 05/25/2023 CLINICAL DATA:  Hypotension EXAM: PORTABLE CHEST 1 VIEW COMPARISON:  October 17, 2022 FINDINGS: Ill-defined right middle lobe infiltrates with prominence of the interstitial markings bilaterally indicates some chronic interstitial lung disease and right middle lobe pneumonia Heart and mediastinum normal without congestive changes IMPRESSION: Right middle lobe pneumonia. Electronically Signed   By: Fredrich Jefferson M.D.   On: 05/25/2023 14:10   ASSESSMENT & PLAN:   LAKEITHA BASQUES is a 73 y.o. person living with a history of ESRD on HD, anemia who presented with weakness and admitted for hypotension and anemia on  hospital day 0  Weakness Hypotension Symptoms are most likely related to poor po intake, acute anemia, and transient hypotension from HD. Weakness has now significantly resolved. BP stabilized with IV fluid bolus.  - Follow vitals - encourage po intake, though NPO at midnight for potential GI procedure tomorrow  Acute on chronic anemia Hx of small bowel AVMs Hgb 7.1 from 11 when checked last month, though often is in the 7-8 range. Known history of AVMs seen on capsule endoscopy.  - GI consulted, appreciate assistance - Pantoprazole  40mg  BID - Trend CBC - NPO at midnight in case of enteroscopy tomorrow  Atrial fibrillation with RVR, resolved No history of Afib. Likely triggered by acute hypotension. Resolved with IV fluid bolus, now HDS in NSR - Continue to monitor on telemetry  ESRD on HD Received full session of HD earlier today. Will consult nephrology tomorrow if patient remains inpatient.   R middle lobe opacity Described as pneumonia on CXR. Patient is afebrile with no respiratory symptoms, no leukocytosis. Other convincing reasons for weakness/malaise. Will not start antibiotic treatment at this time.   Elevated troponin Troponin 25 ->53. Denying current chest pain. No ischemic changes on EKG. Low suspicion for ACS, more likely related to acute hypotension. Will trend troponins until we see a peak  Hypokalemia K 3.2 on admission. Received Kcl 40mEq in the ED - Trend BMP with morning labs  Elevated lactate Likely elevated in the setting of poor po intake, anemia, hypotension during HD. Received IV fluids in the ED - Trend lactate  Diet: Clear liquid, NPO at midnight VTE: SCDs Code: Full  Prior to Admission Living Arrangement: home Anticipated Discharge Location: home Barriers to Discharge: pending medical stability  Signed: Jayson Michael, MD Internal Medicine Resident PGY-1  05/25/2023, 4:56 PM

## 2023-05-25 NOTE — Hospital Course (Addendum)
 Sara Davis

## 2023-05-25 NOTE — ED Notes (Signed)
 Provider made aware of trop 122

## 2023-05-25 NOTE — ED Provider Notes (Signed)
 [place] EMERGENCY DEPARTMENT AT [facility] Provider Note   CSN: [id number] Arrival date & time: 05/25/23  1154     History  Chief Complaint  Patient presents with   hypotensive   Weakness    Sara Davis is a 73 y.o. female.  This is a 73 year old female presenting emergency department for generalized weakness.  Reports feeling weak this morning.  She is ESRD on dialysis Tuesday Thursday Saturday.  Increasing weakness while at dialysis, finished treatment.  Noted to be hypotensive.  Did have bowel movement on herself.  Denies lightheadedness dizziness, chest pain, shortness of breath endorses palpitations, no abdominal pain.  Decreased p.o. intake over the past several days Davis to lack of appetite.   Weakness      Home Medications Prior to Admission medications   Medication Sig Start Date End Date Taking? Authorizing Provider  albuterol  (VENTOLIN  HFA) 108 (90 Base) MCG/ACT inhaler Inhale 1-2 puffs into the lungs every 6 (six) hours as needed for wheezing or shortness of breath. Patient not taking: Reported on 02/09/2023 10/27/22   [provider]  atorvastatin  (LIPITOR ) 80 MG tablet Take 1 tablet (80 mg total) by mouth daily. 12/20/18   Emile Harari, MD  B Complex-C-Folic Acid  (DIALYVITE TABLET) TABS Take 1 tablet by mouth daily. 03/05/22   [provider]  calcitRIOL  (ROCALTROL ) 0.5 MCG capsule Take 4 capsules (2 mcg total) by mouth 2 (two) times daily. 02/11/23   Stovall, Kathryn R, PA-C  calcium  carbonate (OS-CAL - DOSED IN MG OF ELEMENTAL CALCIUM ) 1250 (500 Ca) MG tablet Take 4 tablets (5,000 mg total) by mouth 4 (four) times daily. 02/12/23   Stovall, Kathryn R, PA-C  capsaicin  (ZOSTRIX) 0.025 % cream Apply topically 3 (three) times daily as needed. Apply a small amount to knees three times daily as needed Patient not taking: Reported on 02/09/2023 10/20/22   Verdell Given, MD  carvedilol  (COREG ) 25 MG tablet Take 1 tablet (25 mg total) by mouth 2  (two) times daily with a meal. 05/09/19   Rawland Caddy, NP  folic acid  (FOLVITE ) 1 MG tablet Take 1 tablet (1 mg total) by mouth daily. Patient not taking: Reported on 02/09/2023 10/20/22   Verdell Given, MD      Allergies    Aspirin     Review of Systems   Review of Systems  Neurological:  Positive for weakness.    Physical Exam Updated Vital Signs BP (!) 113/39   Pulse 83   Resp 20   Ht 5\' 6"  (1.676 m)   Wt 54.3 kg   SpO2 100%   BMI 19.32 kg/m  Physical Exam Vitals and nursing note reviewed.  Constitutional:      Comments: Frail-appearing  HENT:     Head: Normocephalic.     Mouth/Throat:     Mouth: Mucous membranes are dry.  Eyes:     Conjunctiva/sclera: Conjunctivae normal.  Cardiovascular:     Rate and Rhythm: Tachycardia present. Rhythm irregular.  Pulmonary:     Effort: Pulmonary effort is normal.     Breath sounds: Normal breath sounds.  Abdominal:     General: Abdomen is flat. There is no distension.     Palpations: Abdomen is soft.     Tenderness: There is no abdominal tenderness. There is no guarding or rebound.  Musculoskeletal:     Right lower leg: No edema.     Left lower leg: No edema.  Skin:    General: Skin is warm and  dry.     Capillary Refill: Capillary refill takes less than 2 seconds.  Neurological:     Mental Status: She is alert and oriented to person, place, and time.  Psychiatric:        Mood and Affect: Mood normal.        Behavior: Behavior normal.     ED Results / Procedures / Treatments   Labs (all labs ordered are listed, but only abnormal results are displayed) Labs Reviewed  CBC - Abnormal; Notable for the following components:      Result Value   RBC 2.57 (*)    Hemoglobin 7.2 (*)    HCT 23.2 (*)    All other components within normal limits  COMPREHENSIVE METABOLIC PANEL WITH GFR - Abnormal; Notable for the following components:   Potassium 3.2 (*)    Chloride 94 (*)    Glucose, Bld 221 (*)    Creatinine, Ser 2.39  (*)    Calcium  7.9 (*)    Total Protein 6.2 (*)    Albumin  3.4 (*)    Alkaline Phosphatase 387 (*)    GFR, Estimated 21 (*)    All other components within normal limits  I-STAT CHEM 8, ED - Abnormal; Notable for the following components:   Potassium 3.1 (*)    Chloride 91 (*)    Creatinine, Ser 2.50 (*)    Glucose, Bld 219 (*)    Calcium , Ion 0.90 (*)    Hemoglobin 7.1 (*)    HCT 21.0 (*)    All other components within normal limits  I-STAT CG4 LACTIC ACID, ED - Abnormal; Notable for the following components:   Lactic Acid, Venous 2.0 (*)    All other components within normal limits  POC OCCULT BLOOD, ED - Abnormal; Notable for the following components:   Fecal Occult Bld POSITIVE (*)    All other components within normal limits  TROPONIN I (HIGH SENSITIVITY) - Abnormal; Notable for the following components:   Troponin I (High Sensitivity) 25 (*)    All other components within normal limits  MAGNESIUM  I-STAT CG4 LACTIC ACID, ED  TROPONIN I (HIGH SENSITIVITY)    EKG EKG Interpretation Date/Time:  Tuesday May 25 2023 14:24:34 EDT Ventricular Rate:  87 PR Interval:  130 QRS Duration:  105 QT Interval:  435 QTC Calculation: 524 R Axis:   -29  Text Interpretation: Sinus rhythm Abnormal R-wave progression, early transition LVH with secondary repolarization abnormality Prolonged QT interval Confirmed by Elise Guile [phone]) on 05/25/2023 2:29:04 PM  Radiology DG Chest Portable 1 View Result Date: 05/25/2023 CLINICAL DATA:  Hypotension EXAM: PORTABLE CHEST 1 VIEW COMPARISON:  October 17, 2022 FINDINGS: Ill-defined right middle lobe infiltrates with prominence of the interstitial markings bilaterally indicates some chronic interstitial lung disease and right middle lobe pneumonia Heart and mediastinum normal without congestive changes IMPRESSION: Right middle lobe pneumonia. Electronically Signed   By: Fredrich Jefferson M.D.   On: 05/25/2023 14:10    Procedures Procedures     Medications Ordered in ED Medications  sodium chloride  0.9 % bolus 500 mL (0 mLs Intravenous Stopped 05/25/23 1325)  potassium chloride  SA (KLOR-CON  M) CR tablet 40 mEq (40 mEq Oral Given 05/25/23 1349)  sodium chloride  0.9 % bolus 250 mL (0 mLs Intravenous Stopped 05/25/23 1401)  pantoprazole  (PROTONIX ) injection 40 mg (40 mg Intravenous Given 05/25/23 1507)    ED Course/ Medical Decision Making/ A&P Clinical Course as of 05/25/23 1549  Tue May 25, 2023  1240  EKG 12-Lead Appears to be A-fib with RVR. [TY]  1302 A-fib RVR on the monitor.  Informed by nursing staff patient with nonsustained V. tach.  Reviewed rhythm strips, appears to have 3-4 beats episodically of 3-4 beats of wide-complex rhythm. Blood pressure soft but improving with IV fluids. [TY]  1318 Lactic Acid, Venous(!!): 2.0 Receiving IVFs. Likely 2/2 to her hypotension which I suspect is hypovolemic.  [TY]  1320 Potassium(!): 3.1 Will replete [TY]  1343 WBC: 6.4 Infectious process less likely.  [TY]  1344 Hemoglobin(!): 7.2 Substantial drop in hemoglobin from eleven ~1 month ago per review. Patient does endorse having dark colored stools. Will get hemoccult.  [TY]  1410 Potassium(!): 3.2 repleated [TY]  1411 Comprehensive metabolic panel(!) Mild hypokalemia noted.  Was repleted.  Patient's i-STAT.  Kidney function consistent with her known ESRD.  Her calcium  is low, but corrects to 8.4.  No anion gap. [TY]  1412 Magnesium: 2.4 Normal [TY]  1412 Troponin I (High Sensitivity)(!): 25 Has chronic elevation, likely secondary to ESRD.  She is not having chest pain.  Troponin appears to be improved compared to prior. [TY]  1413 Patient without overt underlying cause for A-fib with RVR.  Possibly secondary to hypovolemia/anemia versus intrinsic.  Blood pressure is improved with IV fluids. Given she still appears to be in A-fib with heart rate 130s to 150s we will start diltiazem  and plan for admission.  I do not see documented  history of A-fib.  Not on anticoagulation.  Will need admission [TY]  1417 Fecal Occult Blood, POC(!): POSITIVE Will contact GI [TY]  1427 Patient converted to normal sinus rhythm without aid of diltiazem .  Again, no history of A-fib.  I do think she still warrants admission given her drop in hemoglobin with occult positive stool.  Reaching out to GI [TY]  1429 EKG 12-Lead Repeat EKG normal sinus rhythm.  Does have QTc prolongation.  No ST segment changes that would indicate ischemia. [TY]  1547 Discussed case with PA [place] with GI who will see patient in consult. [TY]  1547 Case discussed with hospitalist who agrees to see and admit patient. [TY]    Clinical Course User Index [TY] Rolinda Climes, DO                                 Medical Decision Making This is a 73 year old female complex past medical history to include ESRD on dialysis, diabetes, hypertension, hyperlipidemia history of anemia requiring blood transfusion and hyperparathyroid status post thyroidectomy presents to the emergency department today for generalized weakness and hypotension from her dialysis.  Underwent full session.  Hypotensive here on arrival initial blood pressure 74/56.  Clinically did appear dry, has had decreased p.o. intake over the past several days Davis to decreased appetite.  Given IV fluids with improvement of her blood pressure.  Additionally appears to be A-fib RVR.  Heart rate without improvement after IV fluids.  Mild hypokalemia which was repleted.  No overt sign of infection.  Did have a hemoglobin drop and hemocult positive.  Converted back to normal sinus rhythm spontaneously.  Will admit.  See ED course for further MDM  Amount and/or Complexity of Data Reviewed Independent Historian:     Details: Son notes patient has a history of prior GI bleed and similar presentation in the past External Data Reviewed:     Details: No history of A-fib, last echo had normal EF Labs:  ordered.  Decision-making details documented in ED Course. Radiology: ordered and independent interpretation performed.    Details: Chest x-ray without pneumonia or pneumothorax ECG/medicine tests: ordered. Decision-making details documented in ED Course.  Risk Prescription drug management. Decision regarding hospitalization. Diagnosis or treatment significantly limited by social determinants of health. Risk Details: Poor health literacy.          Final Clinical Impression(s) / ED Diagnoses Final diagnoses:  Atrial fib/flutter, transient (HCC)  Anemia, unspecified type  Gastrointestinal hemorrhage, unspecified gastrointestinal hemorrhage type    Rx / DC Orders ED Discharge Orders     None         Rolinda Climes, DO 05/25/23 1549

## 2023-05-25 NOTE — ED Notes (Signed)
 Pt's son brought food for pt & didn't know she was clears. Provider made aware

## 2023-05-26 ENCOUNTER — Encounter (HOSPITAL_COMMUNITY): Payer: Self-pay | Admitting: Internal Medicine

## 2023-05-26 DIAGNOSIS — K922 Gastrointestinal hemorrhage, unspecified: Secondary | ICD-10-CM | POA: Diagnosis not present

## 2023-05-26 DIAGNOSIS — K921 Melena: Secondary | ICD-10-CM | POA: Diagnosis not present

## 2023-05-26 DIAGNOSIS — I4892 Unspecified atrial flutter: Principal | ICD-10-CM

## 2023-05-26 DIAGNOSIS — Z860101 Personal history of adenomatous and serrated colon polyps: Secondary | ICD-10-CM | POA: Diagnosis not present

## 2023-05-26 DIAGNOSIS — Z8719 Personal history of other diseases of the digestive system: Secondary | ICD-10-CM | POA: Diagnosis not present

## 2023-05-26 DIAGNOSIS — D649 Anemia, unspecified: Secondary | ICD-10-CM | POA: Diagnosis not present

## 2023-05-26 DIAGNOSIS — I4891 Unspecified atrial fibrillation: Secondary | ICD-10-CM | POA: Diagnosis not present

## 2023-05-26 LAB — CBC
HCT: 19.3 % — ABNORMAL LOW (ref 36.0–46.0)
HCT: 23 % — ABNORMAL LOW (ref 36.0–46.0)
Hemoglobin: 6 g/dL — CL (ref 12.0–15.0)
Hemoglobin: 7.4 g/dL — ABNORMAL LOW (ref 12.0–15.0)
MCH: 27.9 pg (ref 26.0–34.0)
MCH: 29 pg (ref 26.0–34.0)
MCHC: 31.1 g/dL (ref 30.0–36.0)
MCHC: 32.2 g/dL (ref 30.0–36.0)
MCV: 89.8 fL (ref 80.0–100.0)
MCV: 90.2 fL (ref 80.0–100.0)
Platelets: 235 10*3/uL (ref 150–400)
Platelets: 238 10*3/uL (ref 150–400)
RBC: 2.15 MIL/uL — ABNORMAL LOW (ref 3.87–5.11)
RBC: 2.55 MIL/uL — ABNORMAL LOW (ref 3.87–5.11)
RDW: 14.6 % (ref 11.5–15.5)
RDW: 14.3 % (ref 11.5–15.5)
WBC: 5.5 10*3/uL (ref 4.0–10.5)
WBC: 5.6 10*3/uL (ref 4.0–10.5)
nRBC: 0 % (ref 0.0–0.2)
nRBC: 0 % (ref 0.0–0.2)

## 2023-05-26 LAB — GLUCOSE, CAPILLARY
Glucose-Capillary: 123 mg/dL — ABNORMAL HIGH (ref 70–99)
Glucose-Capillary: 139 mg/dL — ABNORMAL HIGH (ref 70–99)
Glucose-Capillary: 165 mg/dL — ABNORMAL HIGH (ref 70–99)
Glucose-Capillary: 199 mg/dL — ABNORMAL HIGH (ref 70–99)

## 2023-05-26 LAB — BASIC METABOLIC PANEL WITH GFR
Anion gap: 11 (ref 5–15)
BUN: 15 mg/dL (ref 8–23)
CO2: 30 mmol/L (ref 22–32)
Calcium: 7.7 mg/dL — ABNORMAL LOW (ref 8.9–10.3)
Chloride: 95 mmol/L — ABNORMAL LOW (ref 98–111)
Creatinine, Ser: 3.41 mg/dL — ABNORMAL HIGH (ref 0.44–1.00)
GFR, Estimated: 14 mL/min — ABNORMAL LOW (ref >60)
Glucose, Bld: 201 mg/dL — ABNORMAL HIGH (ref 70–99)
Potassium: 3.8 mmol/L (ref 3.5–5.1)
Sodium: 136 mmol/L (ref 135–145)

## 2023-05-26 LAB — PREPARE RBC (CROSSMATCH)

## 2023-05-26 LAB — HEPATITIS B SURFACE ANTIGEN: Hepatitis B Surface Ag: NONREACTIVE

## 2023-05-26 MED ORDER — SODIUM CHLORIDE 0.9% IV SOLUTION: Freq: Once | INTRAVENOUS | Status: AC

## 2023-05-26 MED ORDER — CHLORHEXIDINE GLUCONATE CLOTH 2 % EX PADS
6.0000 | MEDICATED_PAD | Freq: Every day | CUTANEOUS | Status: DC
  Administered 2023-05-27 – 2023-05-28 (×2): 6 via TOPICAL

## 2023-05-26 NOTE — H&P (View-Only) (Signed)
 Consultation  Referring Provider: [facility]/DR Linder Revere Primary Care Physician:  Hershell Lose, NP Primary Gastroenterologist:  Dr.Perry  Reason for Consultation:  melena, anemia, hx of AVM's   Attending physician's note  I personally saw the patient and performed a substantive portion of this encounter (>50% time spent), including a complete performance of at least one of the key components (MDM, Hx and/or Exam), in conjunction with the APP.  I agree with the APP's note, impression, and recommendations with additional input as follows.    73 year old female with history of end-stage renal disease on hemodialysis, had a large volume melena with worsening anemia She has known AVMs of small bowel likely etiology of GI hemorrhage with bleeding AVM Will plan for enteroscopy tomorrow Monitor hemoglobin and transfuse >7 N.p.o. after midnight  The patient was provided an opportunity to ask questions and all were answered. The patient agreed with the plan and demonstrated an understanding of the instructions.  Lorena Rolling , MD [phone]     HPI: Sara Davis is a 73 y.o. female, who was admitted after dialysis yesterday when she became very weak, hypotensive and diaphoretic.  She relates that she had had a large bowel movement towards the end of the dialysis session that she could not control and that this was black and tarry.  She had not noticed any melena prior to yesterday. When evaluated in the ER she was found to be in A-fib with RVR, had elevated lactate of 2.0, hypokalemia with potassium of 3.1 and hemoglobin of 7.2.  Chest x-ray shows a possible right middle lobe pneumonia. He was transfused 1 unit of packed RBCs in the emergency room, given IV fluids for volume repletion, and rhythm spontaneously converted back to normal sinus. She has been stable since that time, has not had any further bowel movements overnight, she has no complaints of abdominal pain.  She says she was  nauseated yesterday but did not vomit.  Not on any blood thinners at home, no aspirin  or NSAID use. Review of her labs shows her hemoglobin was 11.4 on 04/20/2023 Hemoglobin 7.2 yesterday Hemoglobin 6 earlier this morning, transfused 1 unit BUN15/creatinine 3.41  Other medical problems include diabetes mellitus, hypertension, smoldering multiple myeloma, prior history of adenomatous colon polyps.  Patient does have prior history of GI bleeding secondary to previously documented AVMs.  She had had a colonoscopy in August 2020 for [facility] which was negative. She had GI workup here in September 2024 with enteroscopy with finding of 3 small AVMs in the duodenum which were treated with APC and a small jejunal polyp which was removed. Capsule endoscopy during that same admission showed 1 very proximal AVM just beyond the first duodenal image, and then 2 tiny AVMs at 3 hours and 25 minutes in 3 hours of 27 minutes which were both at least 90 minutes beyond the last duodenal image and very likely out of reach of enteroscopy.   Past Medical History:  Diagnosis Date   Anemia    Cellulitis of scalp 02/2015   Chronic kidney disease 01/20/2021   stage V   Depression    Diabetes mellitus without complication (HCC)    History of blood transfusion 11/2022   Hyperlipidemia    Hyperparathyroidism (HCC)    Hypertension     Past Surgical History:  Procedure Laterality Date   ABDOMINAL HYSTERECTOMY     AV FISTULA PLACEMENT Right 02/05/2021   Procedure: INSERTION OF RIGHT ARM ARTERIOVENOUS (AV) GORE-TEX GRAFT;  Surgeon:  Carlene Che, MD;  Location: [facility] OR;  Service: Vascular;  Laterality: Right;   BIOPSY  01/07/2018   Procedure: BIOPSY;  Surgeon: Tobin Forts, MD;  Location: [facility] ENDOSCOPY;  Service: Endoscopy;;  Clotest   COLONOSCOPY WITH PROPOFOL  N/A 01/07/2018   Procedure: COLONOSCOPY WITH PROPOFOL ;  Surgeon: Tobin Forts, MD;  Location: [facility] ENDOSCOPY;  Service: Endoscopy;  Laterality: N/A;    ENTEROSCOPY N/A 06/17/2022   Procedure: ENTEROSCOPY;  Surgeon: Albertina Hugger, MD;  Location: [facility] ENDOSCOPY;  Service: Gastroenterology;  Laterality: N/A;   ENTEROSCOPY N/A 10/20/2022   Procedure: ENTEROSCOPY;  Surgeon: Elois Hair, MD;  Location: [facility] ENDOSCOPY;  Service: Gastroenterology;  Laterality: N/A;   ESOPHAGOGASTRODUODENOSCOPY (EGD) WITH PROPOFOL  N/A 01/07/2018   Procedure: ESOPHAGOGASTRODUODENOSCOPY (EGD) WITH PROPOFOL ;  Surgeon: Tobin Forts, MD;  Location: [facility] ENDOSCOPY;  Service: Endoscopy;  Laterality: N/A;   ESOPHAGOGASTRODUODENOSCOPY (EGD) WITH PROPOFOL  N/A 12/14/2018   Procedure: ESOPHAGOGASTRODUODENOSCOPY (EGD) WITH PROPOFOL ;  Surgeon: Tobin Forts, MD;  Location: [facility] ENDOSCOPY;  Service: Endoscopy;  Laterality: N/A;   GIVENS CAPSULE STUDY N/A 10/18/2022   Procedure: GIVENS CAPSULE STUDY;  Surgeon: Sergio Dandy, MD;  Location: [facility] ENDOSCOPY;  Service: Gastroenterology;  Laterality: N/A;  Per Mansouraty   HEMOSTASIS CLIP PLACEMENT  06/17/2022   Procedure: HEMOSTASIS CLIP PLACEMENT;  Surgeon: Albertina Hugger, MD;  Location: [facility] ENDOSCOPY;  Service: Gastroenterology;;   HEMOSTASIS CONTROL  12/14/2018   Procedure: HEMOSTASIS CONTROL;  Surgeon: Tobin Forts, MD;  Location: [facility] ENDOSCOPY;  Service: Endoscopy;;   HOT HEMOSTASIS N/A 06/17/2022   Procedure: HOT HEMOSTASIS (ARGON PLASMA COAGULATION/BICAP);  Surgeon: Albertina Hugger, MD;  Location: [facility] ENDOSCOPY;  Service: Gastroenterology;  Laterality: N/A;   HOT HEMOSTASIS N/A 10/20/2022   Procedure: HOT HEMOSTASIS (ARGON PLASMA COAGULATION/BICAP);  Surgeon: Elois Hair, MD;  Location: [facility] ENDOSCOPY;  Service: Gastroenterology;  Laterality: N/A;   PARATHYROIDECTOMY Left 02/09/2023   Procedure: LEFT INFERIOR PARATHYROIDECTOMY;  Surgeon: Oralee Billow, MD;  Location: [facility] OR;  Service: General;  Laterality: Left;   POLYPECTOMY  01/07/2018   Procedure: POLYPECTOMY;  Surgeon: Tobin Forts, MD;  Location: [facility] ENDOSCOPY;   Service: Endoscopy;;   POLYPECTOMY  10/20/2022   Procedure: POLYPECTOMY;  Surgeon: Elois Hair, MD;  Location: [facility] ENDOSCOPY;  Service: Gastroenterology;;   Daryle Eon  06/17/2022   Procedure: Daryle Eon;  Surgeon: Albertina Hugger, MD;  Location: [facility] ENDOSCOPY;  Service: Gastroenterology;;    Prior to Admission medications   Medication Sig Start Date End Date Taking? Authorizing Provider  amLODipine  (NORVASC ) 5 MG tablet Take 1 tablet by mouth daily. 05/06/23  Yes [provider]  atorvastatin  (LIPITOR ) 80 MG tablet Take 1 tablet (80 mg total) by mouth daily. 12/20/18  Yes Emile Harari, MD  B Complex-C-Folic Acid  (DIALYVITE TABLET) TABS Take 1 tablet by mouth daily. 03/05/22  Yes [provider]  calcitRIOL  (ROCALTROL ) 0.5 MCG capsule Take 4 capsules (2 mcg total) by mouth 2 (two) times daily. 02/11/23  Yes Stovall, Kathryn R, PA-C  calcium  carbonate (OS-CAL - DOSED IN MG OF ELEMENTAL CALCIUM ) 1250 (500 Ca) MG tablet Take 4 tablets (5,000 mg total) by mouth 4 (four) times daily. 02/12/23  Yes Stovall, Kathryn R, PA-C  albuterol  (VENTOLIN  HFA) 108 (90 Base) MCG/ACT inhaler Inhale 1-2 puffs into the lungs every 6 (six) hours as needed for wheezing or shortness of breath. Patient not taking: Reported on 02/09/2023 10/27/22   [provider]  capsaicin  (ZOSTRIX) 0.025 %  cream Apply topically 3 (three) times daily as needed. Apply a small amount to knees three times daily as needed Patient not taking: Reported on 02/09/2023 10/20/22   Verdell Given, MD  folic acid  (FOLVITE ) 1 MG tablet Take 1 tablet (1 mg total) by mouth daily. Patient not taking: Reported on 02/09/2023 10/20/22   Verdell Given, MD    Current Facility-Administered Medications  Medication Dose Route Frequency Provider Last Rate Last Admin   acetaminophen  (TYLENOL ) tablet 650 mg  650 mg Oral Q6H PRN Atway, Rayann N, DO       Or   acetaminophen  (TYLENOL ) suppository 650 mg  650 mg Rectal Q6H PRN Atway,  Rayann N, DO       albuterol  (PROVENTIL ) (2.5 MG/3ML) 0.083% nebulizer solution 2.5 mg  2.5 mg Inhalation Q6H PRN Atway, Rayann N, DO       atorvastatin  (LIPITOR ) tablet 80 mg  80 mg Oral Daily Atway, Rayann N, DO       calcitRIOL  (ROCALTROL ) capsule 2 mcg  2 mcg Oral BID Atway, Rayann N, DO   2 mcg at 05/25/23 2258   calcium  carbonate (OS-CAL - dosed in mg of elemental calcium ) tablet 5,000 mg  4 tablet Oral TID AC & HS Atway, Rayann N, DO   5,000 mg at 05/25/23 2258   carvedilol  (COREG ) tablet 25 mg  25 mg Oral BID WC Atway, Rayann N, DO   25 mg at 05/25/23 1814   multivitamin (RENA-VIT) tablet 1 tablet  1 tablet Oral QHS Atway, Rayann N, DO       pantoprazole  (PROTONIX ) injection 40 mg  40 mg Intravenous Q12H Atway, Rayann N, DO        Allergies as of 05/25/2023 - Review Complete 05/25/2023  Allergen Reaction Noted   Aspirin  Other (See Comments) 01/14/2012    Family History  Problem Relation Age of Onset   Diabetes Mother    Cancer Mother    Diabetes Sister    Hypertension Sister    Cancer Sister 57       Bone   Diabetes Brother    Diabetes Sister    Diabetes Sister     Social History   Socioeconomic History   Marital status: Single    Spouse name: Not on file   Number of children: 2   Years of education: Not on file   Highest education level: Not on file  Occupational History   Occupation: Retired  Tobacco Use   Smoking status: Every Day    Current packs/day: 0.50    Types: Cigarettes   Smokeless tobacco: Never  Vaping Use   Vaping status: Never Used  Substance and Sexual Activity   Alcohol use: No    Alcohol/week: 0.0 standard drinks of alcohol   Drug use: No   Sexual activity: Not on file  Other Topics Concern   Not on file  Social History Narrative   Not on file   Social Drivers of Health   Financial Resource Strain: Not on file  Food Insecurity: No Food Insecurity (05/25/2023)   Hunger Vital Sign    Worried About Running Out of Food in the Last Year:  Never true    Ran Out of Food in the Last Year: Never true  Transportation Needs: No Transportation Needs (05/25/2023)   PRAPARE - Transportation    Lack of Transportation (Medical): No    Lack of Transportation (Non-Medical): No  Physical Activity: Not on file  Stress: Not on file  Social Connections: Socially  Isolated (05/26/2023)   Social Connection and Isolation Panel [NHANES]    Frequency of Communication with Friends and Family: Never    Frequency of Social Gatherings with Friends and Family: Never    Attends Religious Services: Never    Database administrator or Organizations: No    Attends Banker Meetings: Never    Marital Status: Divorced  Catering manager Violence: Not At Risk (05/25/2023)   Humiliation, Afraid, Rape, and Kick questionnaire    Fear of Current or Ex-Partner: No    Emotionally Abused: No    Physically Abused: No    Sexually Abused: No    Review of Systems: Pertinent positive and negative review of systems were noted in the above HPI section.  All other review of systems was otherwise negative.   Physical Exam: Vital signs in last 24 hours: Temp:  [98.2 F (36.8 C)-99.1 F (37.3 C)] 99.1 F (37.3 C) (04/23 0825) Pulse Rate:  [27-134] 73 (04/23 0825) Resp:  [10-23] 18 (04/23 0825) BP: (74-150)/(38-87) 109/70 (04/23 0825) SpO2:  [97 %-100 %] 100 % (04/23 0825) Weight:  [54.3 kg] 54.3 kg (04/22 1205) Last BM Date : 05/25/23 General:   Alert,  Well-developed, well-nourished, older African-American female pleasant and cooperative in NAD Head:  Normocephalic and atraumatic. Eyes:  Sclera clear, no icterus.   Conjunctiva pale Ears:  Normal auditory acuity. Nose:  No deformity, discharge,  or lesions. Mouth:  No deformity or lesions.   Neck:  Supple; no masses or thyromegaly. Lungs:   Clear throughout to auscultation.   No wheezes, crackles, or rhonchi. Heart:  Regular rate and rhythm; no murmurs, clicks, rubs,  or gallops. Abdomen:   Soft,nontender, BS active,nonpalp mass or hsm.   Rectal: Not done heme positive in the ER and melena noted Msk:  Symmetrical without gross deformities. . Pulses:  Normal pulses noted. Extremities:  Without clubbing or edema. Neurologic:  Alert and  oriented x4;  grossly normal neurologically. Skin:  Intact without significant lesions or rashes.. Psych:  Alert and cooperative. Normal mood and affect.  Intake/Output from previous day: 04/22 0701 - 04/23 0700 In: 326 [Blood:326] Out: -  Intake/Output this shift: No intake/output data recorded.  Lab Results: Recent Labs    05/25/23 1253 05/25/23 1305 05/26/23 0131 05/26/23 0838  WBC 6.4  --  5.5 5.6  HGB 7.2* 7.1* 6.0* 7.4*  HCT 23.2* 21.0* 19.3* 23.0*  PLT 277  --  235 238   BMET Recent Labs    05/25/23 1253 05/25/23 1305 05/26/23 0131  NA 135 136 136  K 3.2* 3.1* 3.8  CL 94* 91* 95*  CO2 28  --  30  GLUCOSE 221* 219* 201*  BUN 11 11 15   CREATININE 2.39* 2.50* 3.41*  CALCIUM  7.9*  --  7.7*   LFT Recent Labs    05/25/23 1253  PROT 6.2*  ALBUMIN  3.4*  AST 16  ALT 8  ALKPHOS 387*  BILITOT 0.4   PT/INR No results for input(s): "LABPROT", "INR" in the last 72 hours. Hepatitis Panel No results for input(s): "HEPBSAG", "HCVAB", "HEPAIGM", "HEPBIGM" in the last 72 hours.   IMPRESSION:  #65 73 year old African-American female with history of end-stage renal disease on dialysis, admitted after dialysis yesterday when she developed hypotension, diaphoresis and then had a large melenic appearing stool.  Found to be in A-fib with RVR in the ER, hypokalemic, elevated lactate and with hemoglobin of 7.2 down from her baseline in the 11 range from March 2025  She was volume resuscitated in the ER, spontaneously converted to normal sinus rhythm, electrolytes replaced and has been hemodynamically stable. No further active bleeding  Status post 1 unit of packed RBCs.  Patient does have history of anemia and intermittent  chronic blood loss secondary to previously documented AVMs.  She had capsule endoscopy and enteroscopy in September 2024 with finding of 3 duodenal AVMs which were treated with APC, and 1 small jejunal polyp which was removed.  At capsule endoscopy was noted to have a couple of tiny AVMs in her more distal small bowel at least 90 minutes beyond the first duodenal image.  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  #2 history of adenomatous colon polyps-colonoscopy here 2019, had a colonoscopy through [facility] in August 2024 reportedly negative per patient and told to follow-up in 5 years.  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 past midnight Continue to trend hemoglobin and transfuse to keep her hemoglobin above 7 Patient has been scheduled for enteroscopy with Dr. Leonia Raman tomorrow 05/27/2023 in a.m.  Procedure has been discussed  the patient in detail including indications risks and benefits and she is agreeable to proceed. Hopefully her dialysis can be scheduled for after the procedure tomorrow. Further recommendations pending findings at enteroscopy.   Amy Esterwood PA-C 05/26/2023, 11:15 AM

## 2023-05-26 NOTE — Progress Notes (Signed)
 Blood admin finished at 0518 and vitals have been taken. was administered.

## 2023-05-26 NOTE — Consult Note (Signed)
 [facility] Renal Consultation Note    Indication for Consultation:  Management of ESRD/hemodialysis, anemia, hypertension/volume, and secondary hyperparathyroidism. PCP:  HPI: Sara Davis is a 73 y.o. female with ESRD, HTN, T2DM, smoldering MM, Hx GIB, Hx recent L inferior parathyroid  glad resection who was admitted with weakness/anemia, concern for possible GIB.  Presented to ED from dialysis on 4/22, had completed her treatment but then was very weak and had BM on herself. In ED, found to be in A-fib RVR with Hgb 7.2 and guaiac positive stool. She was given IVFs. K 3.2 post-HD, was repleted due to A-fib. She was given 1U PRBCs and admitted. Rhythm spontaneously converted back to NSR. GI consulted, pending.  Seen in 6N room. Feels well. Denies any abdominal pain. No fever, chills, CP, dyspnea. She is still a little tired, but much better than yesterday. Tells me that she heard she will be going for EGD tomorrow.  Dialyzes on TTS schedule at [facility] clinic. S/p full HD yesterday prior to coming to ED.   Past Medical History:  Diagnosis Date   Anemia    Cellulitis of scalp 02/2015   Chronic kidney disease 01/20/2021   stage V   Depression    Diabetes mellitus without complication (HCC)    History of blood transfusion 11/2022   Hyperlipidemia    Hyperparathyroidism (HCC)    Hypertension    Past Surgical History:  Procedure Laterality Date   ABDOMINAL HYSTERECTOMY     AV FISTULA PLACEMENT Right 02/05/2021   Procedure: INSERTION OF RIGHT ARM ARTERIOVENOUS (AV) GORE-TEX GRAFT;  Surgeon: Carlene Che, MD;  Location: [facility] OR;  Service: Vascular;  Laterality: Right;   BIOPSY  01/07/2018   Procedure: BIOPSY;  Surgeon: Tobin Forts, MD;  Location: [facility] ENDOSCOPY;  Service: Endoscopy;;  Clotest   COLONOSCOPY WITH PROPOFOL  N/A 01/07/2018   Procedure: COLONOSCOPY WITH PROPOFOL ;  Surgeon: Tobin Forts, MD;  Location: [facility] ENDOSCOPY;  Service: Endoscopy;  Laterality: N/A;    ENTEROSCOPY N/A 06/17/2022   Procedure: ENTEROSCOPY;  Surgeon: Albertina Hugger, MD;  Location: [facility] ENDOSCOPY;  Service: Gastroenterology;  Laterality: N/A;   ENTEROSCOPY N/A 10/20/2022   Procedure: ENTEROSCOPY;  Surgeon: Elois Hair, MD;  Location: [facility] ENDOSCOPY;  Service: Gastroenterology;  Laterality: N/A;   ESOPHAGOGASTRODUODENOSCOPY (EGD) WITH PROPOFOL  N/A 01/07/2018   Procedure: ESOPHAGOGASTRODUODENOSCOPY (EGD) WITH PROPOFOL ;  Surgeon: Tobin Forts, MD;  Location: [facility] ENDOSCOPY;  Service: Endoscopy;  Laterality: N/A;   ESOPHAGOGASTRODUODENOSCOPY (EGD) WITH PROPOFOL  N/A 12/14/2018   Procedure: ESOPHAGOGASTRODUODENOSCOPY (EGD) WITH PROPOFOL ;  Surgeon: Tobin Forts, MD;  Location: [facility] ENDOSCOPY;  Service: Endoscopy;  Laterality: N/A;   GIVENS CAPSULE STUDY N/A 10/18/2022   Procedure: GIVENS CAPSULE STUDY;  Surgeon: Sergio Dandy, MD;  Location: [facility] ENDOSCOPY;  Service: Gastroenterology;  Laterality: N/A;  Per Mansouraty   HEMOSTASIS CLIP PLACEMENT  06/17/2022   Procedure: HEMOSTASIS CLIP PLACEMENT;  Surgeon: Albertina Hugger, MD;  Location: [facility] ENDOSCOPY;  Service: Gastroenterology;;   HEMOSTASIS CONTROL  12/14/2018   Procedure: HEMOSTASIS CONTROL;  Surgeon: Tobin Forts, MD;  Location: [facility] ENDOSCOPY;  Service: Endoscopy;;   HOT HEMOSTASIS N/A 06/17/2022   Procedure: HOT HEMOSTASIS (ARGON PLASMA COAGULATION/BICAP);  Surgeon: Albertina Hugger, MD;  Location: [facility] ENDOSCOPY;  Service: Gastroenterology;  Laterality: N/A;   HOT HEMOSTASIS N/A 10/20/2022   Procedure: HOT HEMOSTASIS (ARGON PLASMA COAGULATION/BICAP);  Surgeon: Elois Hair, MD;  Location: [facility] ENDOSCOPY;  Service: Gastroenterology;  Laterality: N/A;   PARATHYROIDECTOMY  Left 02/09/2023   Procedure: LEFT INFERIOR PARATHYROIDECTOMY;  Surgeon: Oralee Billow, MD;  Location: [facility] OR;  Service: General;  Laterality: Left;   POLYPECTOMY  01/07/2018   Procedure: POLYPECTOMY;  Surgeon: Tobin Forts, MD;  Location: [facility] ENDOSCOPY;   Service: Endoscopy;;   POLYPECTOMY  10/20/2022   Procedure: POLYPECTOMY;  Surgeon: Elois Hair, MD;  Location: [facility] ENDOSCOPY;  Service: Gastroenterology;;   Daryle Eon  06/17/2022   Procedure: Daryle Eon;  Surgeon: Albertina Hugger, MD;  Location: [facility] ENDOSCOPY;  Service: Gastroenterology;;   Family History  Problem Relation Age of Onset   Diabetes Mother    Cancer Mother    Diabetes Sister    Hypertension Sister    Cancer Sister 59       Bone   Diabetes Brother    Diabetes Sister    Diabetes Sister    Social History:  reports that she has been smoking cigarettes. She has never used smokeless tobacco. She reports that she does not drink alcohol and does not use drugs.  ROS: As per HPI otherwise negative.  Physical Exam: Vitals:   05/26/23 0234 05/26/23 0250 05/26/23 0521 05/26/23 0825  BP: (!) 101/55 (!) 101/51 109/60 109/70  Pulse: 77 74  73  Resp: 19  20 18   Temp: 98.5 F (36.9 C) 98.4 F (36.9 C) 98.3 F (36.8 C) 99.1 F (37.3 C)  TempSrc: Oral Oral Oral Oral  SpO2: 100% 100% 100% 100%  Weight:      Height:         General: Well developed, well nourished, in no acute distress. Room air. Head: Normocephalic, atraumatic, sclera non-icteric, mucus membranes are moist. Neck: Supple without lymphadenopathy/masses. JVD not elevated. Lungs: Clear bilaterally to auscultation without wheezes, rales, or rhonchi. Breathing is unlabored. Heart: RRR with normal S1, S2. No murmurs, rubs, or gallops appreciated. Abdomen: Soft, non-tender, non-distended with normoactive bowel sounds.  Musculoskeletal:  Strength and tone appear normal for age. Lower extremities: No edema or ischemic changes, no open wounds. Neuro: Alert and oriented X 3. Moves all extremities spontaneously. Psych:  Responds to questions appropriately with a normal affect. Dialysis Access: RUE AVG +t/b  Allergies  Allergen Reactions   Aspirin  Other (See Comments)    Upset stomach, can only take  coated aspirin  but prefers to not take it at all   Prior to Admission medications   Medication Sig Start Date End Date Taking? Authorizing Provider  amLODipine  (NORVASC ) 5 MG tablet Take 1 tablet by mouth daily. 05/06/23  Yes [provider]  atorvastatin  (LIPITOR ) 80 MG tablet Take 1 tablet (80 mg total) by mouth daily. 12/20/18  Yes Emile Harari, MD  B Complex-C-Folic Acid  (DIALYVITE TABLET) TABS Take 1 tablet by mouth daily. 03/05/22  Yes [provider]  calcitRIOL  (ROCALTROL ) 0.5 MCG capsule Take 4 capsules (2 mcg total) by mouth 2 (two) times daily. 02/11/23  Yes Khristie Sak R, PA-C  calcium  carbonate (OS-CAL - DOSED IN MG OF ELEMENTAL CALCIUM ) 1250 (500 Ca) MG tablet Take 4 tablets (5,000 mg total) by mouth 4 (four) times daily. 02/12/23  Yes Eleonor Ocon R, PA-C  albuterol  (VENTOLIN  HFA) 108 (90 Base) MCG/ACT inhaler Inhale 1-2 puffs into the lungs every 6 (six) hours as needed for wheezing or shortness of breath. Patient not taking: Reported on 02/09/2023 10/27/22   [provider]  capsaicin  (ZOSTRIX) 0.025 % cream Apply topically 3 (three) times daily as needed. Apply a small amount to knees three times daily as  needed Patient not taking: Reported on 02/09/2023 10/20/22   Verdell Given, MD  folic acid  (FOLVITE ) 1 MG tablet Take 1 tablet (1 mg total) by mouth daily. Patient not taking: Reported on 02/09/2023 10/20/22   Verdell Given, MD   Current Facility-Administered Medications  Medication Dose Route Frequency Provider Last Rate Last Admin   acetaminophen  (TYLENOL ) tablet 650 mg  650 mg Oral Q6H PRN Atway, Rayann N, DO       Or   acetaminophen  (TYLENOL ) suppository 650 mg  650 mg Rectal Q6H PRN Atway, Rayann N, DO       albuterol  (PROVENTIL ) (2.5 MG/3ML) 0.083% nebulizer solution 2.5 mg  2.5 mg Inhalation Q6H PRN Atway, Rayann N, DO       atorvastatin  (LIPITOR ) tablet 80 mg  80 mg Oral Daily Atway, Rayann N, DO       calcitRIOL  (ROCALTROL ) capsule 2 mcg  2  mcg Oral BID Atway, Rayann N, DO   2 mcg at 05/25/23 2258   calcium  carbonate (OS-CAL - dosed in mg of elemental calcium ) tablet 5,000 mg  4 tablet Oral TID AC & HS Atway, Rayann N, DO   5,000 mg at 05/25/23 2258   carvedilol  (COREG ) tablet 25 mg  25 mg Oral BID WC Atway, Rayann N, DO   25 mg at 05/25/23 1814   multivitamin (RENA-VIT) tablet 1 tablet  1 tablet Oral QHS Atway, Rayann N, DO       pantoprazole  (PROTONIX ) injection 40 mg  40 mg Intravenous Q12H Atway, Rayann N, DO       Labs: Basic Metabolic Panel: Recent Labs  Lab 05/25/23 1253 05/25/23 1305 05/26/23 0131  NA 135 136 136  K 3.2* 3.1* 3.8  CL 94* 91* 95*  CO2 28  --  30  GLUCOSE 221* 219* 201*  BUN 11 11 15   CREATININE 2.39* 2.50* 3.41*  CALCIUM  7.9*  --  7.7*   Liver Function Tests: Recent Labs  Lab 05/25/23 1253  AST 16  ALT 8  ALKPHOS 387*  BILITOT 0.4  PROT 6.2*  ALBUMIN  3.4*   CBC: Recent Labs  Lab 05/25/23 1253 05/25/23 1305 05/26/23 0131 05/26/23 0838  WBC 6.4  --  5.5 5.6  HGB 7.2* 7.1* 6.0* 7.4*  HCT 23.2* 21.0* 19.3* 23.0*  MCV 90.3  --  89.8 90.2  PLT 277  --  235 238   CBG: Recent Labs  Lab 05/25/23 2139 05/26/23 0822  GLUCAP 253* 123*   Studies/Results: DG Chest Portable 1 View Result Date: 05/25/2023 CLINICAL DATA:  Hypotension EXAM: PORTABLE CHEST 1 VIEW COMPARISON:  October 17, 2022 FINDINGS: Ill-defined right middle lobe infiltrates with prominence of the interstitial markings bilaterally indicates some chronic interstitial lung disease and right middle lobe pneumonia Heart and mediastinum normal without congestive changes IMPRESSION: Right middle lobe pneumonia. Electronically Signed   By: Fredrich Jefferson M.D.   On: 05/25/2023 14:10   Dialysis Orders:  TTS - GOC 3:45hr, 400/700, EDW 53.5kg, 2K/2Ca, RUE AVG, no heparin  - Mircear 150mcg IV q 2 weeks (last given 75mcg on 4/10) - Calcitriol  2.75mcg PO q HD - Sensipar 30mg  Poq HD ---> Last PTH 1236 ---> Hgb dropping as  outpatient: 10.9 (4/3), 9.4 (4/10), 8.1 (4/19)  Assessment/Plan:  Presumed UGIB: Positive stool guaiacs, weakness. Hx GIB. GI consult pending. S/p 1U PRBCs 4/22, due for ESA - ordered.  ESRD: Continue usual TTS schedule -> HD tomorrow, no heparin .  Hypertension/volume: BP low/stable, no edema on exam  Metabolic bone disease:  Ca low today, last was high as outpatient - all over the past.  A-fib RVR: On admit, resolved  ?RML pneumonia: Per CXR, asymptomatic. Per primary.  Janus Mercury, PA-C 05/26/2023, 11:46 AM  [employer]

## 2023-05-26 NOTE — Anesthesia Preprocedure Evaluation (Signed)
 Anesthesia Evaluation  Patient identified by MRN, date of birth, ID band Patient awake    Reviewed: Allergy & Precautions, NPO status , Patient's Chart, lab work & pertinent test results  History of Anesthesia Complications Negative for: history of anesthetic complications  Airway Mallampati: I  TM Distance: >3 FB Neck ROM: Full    Dental no notable dental hx. (+) Edentulous Upper, Edentulous Lower   Pulmonary Current Smoker and Patient abstained from smoking. R Lung opacity   Pulmonary exam normal breath sounds clear to auscultation       Cardiovascular hypertension, + angina  (-) Past MI Normal cardiovascular exam+ dysrhythmias Atrial Fibrillation  Rhythm:Regular Rate:Normal     Neuro/Psych    GI/Hepatic   Endo/Other  diabetes    Renal/GU ESRF and DialysisRenal diseaseT, Th, S dialysis  Lab Results      Component                Value               Date                                  K                        3.8                 05/26/2023               CREATININE               3.41 (H)            05/26/2023                     Musculoskeletal   Abdominal   Peds  Hematology  (+) Blood dyscrasia, anemia Lab Results      Component                Value               Date                      WBC                      5.6                 05/26/2023                HGB                      7.4 (L)             05/26/2023                HCT                      23.0 (L)            05/26/2023                MCV                      90.2                05/26/2023  PLT                      238                 05/26/2023              Anesthesia Other Findings   Reproductive/Obstetrics                             Anesthesia Physical Anesthesia Plan  ASA: 4  Anesthesia Plan: MAC   Post-op Pain Management: Minimal or no pain anticipated   Induction:   PONV Risk Score and  Plan: Treatment may vary due to age or medical condition and Propofol  infusion  Airway Management Planned: Natural Airway and Nasal Cannula  Additional Equipment: None  Intra-op Plan:   Post-operative Plan:   Informed Consent: I have reviewed the patients History and Physical, chart, labs and discussed the procedure including the risks, benefits and alternatives for the proposed anesthesia with the patient or authorized representative who has indicated his/her understanding and acceptance.     Dental advisory given  Plan Discussed with: CRNA and Anesthesiologist  Anesthesia Plan Comments: (Melena and hx of AVMs)       Anesthesia Quick Evaluation

## 2023-05-26 NOTE — Progress Notes (Signed)
                  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 No new issues. She expresses frustration at her anemia, which has received treatment for multiple times in the past.   Objective:  Vital signs in last 24 hours: Vitals:   05/26/23 0234 05/26/23 0250 05/26/23 0521 05/26/23 0825  BP: (!) 101/55 (!) 101/51 109/60 109/70  Pulse: 77 74  73  Resp: 19  20 18   Temp: 98.5 F (36.9 C) 98.4 F (36.9 C) 98.3 F (36.8 C) 99.1 F (37.3 C)  TempSrc: Oral Oral Oral Oral  SpO2: 100% 100% 100% 100%  Weight:      Height:       Supplemental O2: Room Air SpO2: 100 %  Physical Exam:  Constitutional: elderly female, low BMI, no acute distress Cardiovascular: RRR Neuro: A&O x 3  Assessment/Plan:   Acute on chronic anemia Hx AVMs Hemoglobin this morning was 6.0. Received 1 unit pRBCs with follow up CBC showing hgb 7.4.  - GI consulted, plan for enteroscopy tomorrow - NPO at midnight - SCDs for DVT ppx  ESRD on HD No significant electrolyte abnormalities on BMP this am. Creatinine at baseline. Received HD yesterday.  - Nephrology consulted for HD needs  Weakness, improved Hypotension, resolved Atrial fibrillation with RVR, resolved Patient remains in normal sinus rhythm with stable blood pressure. She feels her weakness has significantly improved. Her weakness is likely due to hypotension during HD, malnutrition, and acute on chronic anemia. Will continue to monitor symptoms and encourage po intake.   Hypokalemia K 3.8 today, improved after repletion. Continue to monitor with daily labs.   R middle lobe opacity: Continues to be afebrile with no supplemental oxygen requirement.  Elevated troponin: Asymptomatic, low suspicion for ACS.  Likely demand ischemia due to hypotension Elevated lactate: Resolved to normal limits with IV fluids  Diet: soft diet, NPO at midnight for enteroscopy tomorrow VTE: SCD Code: Full  Dispo: Anticipated discharge to Home pending medical stability.   Jayson Michael, MD PGY-1 Internal Medicine Resident Pager Number [phone] Please contact the on call pager after 5 pm and on weekends at [phone].

## 2023-05-26 NOTE — Progress Notes (Signed)
 Made MD aware of critical Hgb of 6.0

## 2023-05-26 NOTE — Care Management Obs Status (Signed)
 MEDICARE OBSERVATION STATUS NOTIFICATION   Patient Details  Name: Sara Davis MRN: [id number] Date of Birth: February 16, 1950   Medicare Observation Status Notification Given:  Yes    Terre Ferri, RN 05/26/2023, 3:22 PM

## 2023-05-26 NOTE — Progress Notes (Signed)
   05/26/23 1526  TOC Brief Assessment  Insurance and Status Reviewed  Patient has primary care physician Yes  Home environment has been reviewed son  Prior level of function: independent  Prior/Current Home Services No current home services  Social Drivers of Health Review SDOH reviewed no interventions necessary  Readmission risk has been reviewed Yes  Transition of care needs no transition of care needs at this time   Patient from home with son.   Plan endo tomorrow    Transition of Care Department [facility])  will continue to monitor patient advancement through interdisciplinary progression rounds. If new patient transition needs arise, please place a TOC consult.

## 2023-05-26 NOTE — Consult Note (Addendum)
 Consultation  Referring Provider: [facility]/DR Linder Revere Primary Care Physician:  Sara Lose, NP Primary Gastroenterologist:  Dr.Perry  Reason for Consultation:  melena, anemia, hx of AVM's   Attending physician's note  I personally saw the patient and performed a substantive portion of this encounter (>50% time spent), including a complete performance of at least one of the key components (MDM, Hx and/or Exam), in conjunction with the APP.  I agree with the APP's note, impression, and recommendations with additional input as follows.    73 year old female with history of end-stage renal disease on hemodialysis, had a large volume melena with worsening anemia She has known AVMs of small bowel likely etiology of GI hemorrhage with bleeding AVM Will plan for enteroscopy tomorrow Monitor hemoglobin and transfuse >7 N.p.o. after midnight  The patient was provided an opportunity to ask questions and all were answered. The patient agreed with the plan and demonstrated an understanding of the instructions.  Sara Davis , MD [phone]     HPI: Sara Davis is a 73 y.o. female, who was admitted after dialysis yesterday when she became very weak, hypotensive and diaphoretic.  She relates that she had had a large bowel movement towards the end of the dialysis session that she could not control and that this was black and tarry.  She had not noticed any melena prior to yesterday. When evaluated in the ER she was found to be in A-fib with RVR, had elevated lactate of 2.0, hypokalemia with potassium of 3.1 and hemoglobin of 7.2.  Chest x-ray shows a possible right middle lobe pneumonia. He was transfused 1 unit of packed RBCs in the emergency room, given IV fluids for volume repletion, and rhythm spontaneously converted back to normal sinus. She has been stable since that time, has not had any further bowel movements overnight, she has no complaints of abdominal pain.  She says she was  nauseated yesterday but did not vomit.  Not on any blood thinners at home, no aspirin  or NSAID use. Review of her labs shows her hemoglobin was 11.4 on 04/20/2023 Hemoglobin 7.2 yesterday Hemoglobin 6 earlier this morning, transfused 1 unit BUN15/creatinine 3.41  Other medical problems include diabetes mellitus, hypertension, smoldering multiple myeloma, prior history of adenomatous colon polyps.  Patient does have prior history of GI bleeding secondary to previously documented AVMs.  She had had a colonoscopy in August 2020 for [facility] which was negative. She had GI workup here in September 2024 with enteroscopy with finding of 3 small AVMs in the duodenum which were treated with APC and a small jejunal polyp which was removed. Capsule endoscopy during that same admission showed 1 very proximal AVM just beyond the first duodenal image, and then 2 tiny AVMs at 3 hours and 25 minutes in 3 hours of 27 minutes which were both at least 90 minutes beyond the last duodenal image and very likely out of reach of enteroscopy.   Past Medical History:  Diagnosis Date   Anemia    Cellulitis of scalp 02/2015   Chronic kidney disease 01/20/2021   stage V   Depression    Diabetes mellitus without complication (HCC)    History of blood transfusion 11/2022   Hyperlipidemia    Hyperparathyroidism (HCC)    Hypertension     Past Surgical History:  Procedure Laterality Date   ABDOMINAL HYSTERECTOMY     AV FISTULA PLACEMENT Right 02/05/2021   Procedure: INSERTION OF RIGHT ARM ARTERIOVENOUS (AV) GORE-TEX GRAFT;  Surgeon:  Carlene Che, MD;  Location: [facility] OR;  Service: Vascular;  Laterality: Right;   BIOPSY  01/07/2018   Procedure: BIOPSY;  Surgeon: Tobin Forts, MD;  Location: [facility] ENDOSCOPY;  Service: Endoscopy;;  Clotest   COLONOSCOPY WITH PROPOFOL  N/A 01/07/2018   Procedure: COLONOSCOPY WITH PROPOFOL ;  Surgeon: Tobin Forts, MD;  Location: [facility] ENDOSCOPY;  Service: Endoscopy;  Laterality: N/A;    ENTEROSCOPY N/A 06/17/2022   Procedure: ENTEROSCOPY;  Surgeon: Albertina Hugger, MD;  Location: [facility] ENDOSCOPY;  Service: Gastroenterology;  Laterality: N/A;   ENTEROSCOPY N/A 10/20/2022   Procedure: ENTEROSCOPY;  Surgeon: Elois Hair, MD;  Location: [facility] ENDOSCOPY;  Service: Gastroenterology;  Laterality: N/A;   ESOPHAGOGASTRODUODENOSCOPY (EGD) WITH PROPOFOL  N/A 01/07/2018   Procedure: ESOPHAGOGASTRODUODENOSCOPY (EGD) WITH PROPOFOL ;  Surgeon: Tobin Forts, MD;  Location: [facility] ENDOSCOPY;  Service: Endoscopy;  Laterality: N/A;   ESOPHAGOGASTRODUODENOSCOPY (EGD) WITH PROPOFOL  N/A 12/14/2018   Procedure: ESOPHAGOGASTRODUODENOSCOPY (EGD) WITH PROPOFOL ;  Surgeon: Tobin Forts, MD;  Location: [facility] ENDOSCOPY;  Service: Endoscopy;  Laterality: N/A;   GIVENS CAPSULE STUDY N/A 10/18/2022   Procedure: GIVENS CAPSULE STUDY;  Surgeon: Sergio Dandy, MD;  Location: [facility] ENDOSCOPY;  Service: Gastroenterology;  Laterality: N/A;  Per Mansouraty   HEMOSTASIS CLIP PLACEMENT  06/17/2022   Procedure: HEMOSTASIS CLIP PLACEMENT;  Surgeon: Albertina Hugger, MD;  Location: [facility] ENDOSCOPY;  Service: Gastroenterology;;   HEMOSTASIS CONTROL  12/14/2018   Procedure: HEMOSTASIS CONTROL;  Surgeon: Tobin Forts, MD;  Location: [facility] ENDOSCOPY;  Service: Endoscopy;;   HOT HEMOSTASIS N/A 06/17/2022   Procedure: HOT HEMOSTASIS (ARGON PLASMA COAGULATION/BICAP);  Surgeon: Albertina Hugger, MD;  Location: [facility] ENDOSCOPY;  Service: Gastroenterology;  Laterality: N/A;   HOT HEMOSTASIS N/A 10/20/2022   Procedure: HOT HEMOSTASIS (ARGON PLASMA COAGULATION/BICAP);  Surgeon: Elois Hair, MD;  Location: [facility] ENDOSCOPY;  Service: Gastroenterology;  Laterality: N/A;   PARATHYROIDECTOMY Left 02/09/2023   Procedure: LEFT INFERIOR PARATHYROIDECTOMY;  Surgeon: Oralee Billow, MD;  Location: [facility] OR;  Service: General;  Laterality: Left;   POLYPECTOMY  01/07/2018   Procedure: POLYPECTOMY;  Surgeon: Tobin Forts, MD;  Location: [facility] ENDOSCOPY;   Service: Endoscopy;;   POLYPECTOMY  10/20/2022   Procedure: POLYPECTOMY;  Surgeon: Elois Hair, MD;  Location: [facility] ENDOSCOPY;  Service: Gastroenterology;;   Daryle Eon  06/17/2022   Procedure: Daryle Eon;  Surgeon: Albertina Hugger, MD;  Location: [facility] ENDOSCOPY;  Service: Gastroenterology;;    Prior to Admission medications   Medication Sig Start Date End Date Taking? Authorizing Provider  amLODipine  (NORVASC ) 5 MG tablet Take 1 tablet by mouth daily. 05/06/23  Yes [provider]  atorvastatin  (LIPITOR ) 80 MG tablet Take 1 tablet (80 mg total) by mouth daily. 12/20/18  Yes Emile Harari, MD  B Complex-C-Folic Acid  (DIALYVITE TABLET) TABS Take 1 tablet by mouth daily. 03/05/22  Yes [provider]  calcitRIOL  (ROCALTROL ) 0.5 MCG capsule Take 4 capsules (2 mcg total) by mouth 2 (two) times daily. 02/11/23  Yes Stovall, Kathryn R, PA-C  calcium  carbonate (OS-CAL - DOSED IN MG OF ELEMENTAL CALCIUM ) 1250 (500 Ca) MG tablet Take 4 tablets (5,000 mg total) by mouth 4 (four) times daily. 02/12/23  Yes Stovall, Kathryn R, PA-C  albuterol  (VENTOLIN  HFA) 108 (90 Base) MCG/ACT inhaler Inhale 1-2 puffs into the lungs every 6 (six) hours as needed for wheezing or shortness of breath. Patient not taking: Reported on 02/09/2023 10/27/22   [provider]  capsaicin  (ZOSTRIX) 0.025 %  cream Apply topically 3 (three) times daily as needed. Apply a small amount to knees three times daily as needed Patient not taking: Reported on 02/09/2023 10/20/22   Verdell Given, MD  folic acid  (FOLVITE ) 1 MG tablet Take 1 tablet (1 mg total) by mouth daily. Patient not taking: Reported on 02/09/2023 10/20/22   Verdell Given, MD    Current Facility-Administered Medications  Medication Dose Route Frequency Provider Last Rate Last Admin   acetaminophen  (TYLENOL ) tablet 650 mg  650 mg Oral Q6H PRN Atway, Rayann N, DO       Or   acetaminophen  (TYLENOL ) suppository 650 mg  650 mg Rectal Q6H PRN Atway,  Rayann N, DO       albuterol  (PROVENTIL ) (2.5 MG/3ML) 0.083% nebulizer solution 2.5 mg  2.5 mg Inhalation Q6H PRN Atway, Rayann N, DO       atorvastatin  (LIPITOR ) tablet 80 mg  80 mg Oral Daily Atway, Rayann N, DO       calcitRIOL  (ROCALTROL ) capsule 2 mcg  2 mcg Oral BID Atway, Rayann N, DO   2 mcg at 05/25/23 2258   calcium  carbonate (OS-CAL - dosed in mg of elemental calcium ) tablet 5,000 mg  4 tablet Oral TID AC & HS Atway, Rayann N, DO   5,000 mg at 05/25/23 2258   carvedilol  (COREG ) tablet 25 mg  25 mg Oral BID WC Atway, Rayann N, DO   25 mg at 05/25/23 1814   multivitamin (RENA-VIT) tablet 1 tablet  1 tablet Oral QHS Atway, Rayann N, DO       pantoprazole  (PROTONIX ) injection 40 mg  40 mg Intravenous Q12H Atway, Rayann N, DO        Allergies as of 05/25/2023 - Review Complete 05/25/2023  Allergen Reaction Noted   Aspirin  Other (See Comments) 01/14/2012    Family History  Problem Relation Age of Onset   Diabetes Mother    Cancer Mother    Diabetes Sister    Hypertension Sister    Cancer Sister 66       Bone   Diabetes Brother    Diabetes Sister    Diabetes Sister     Social History   Socioeconomic History   Marital status: Single    Spouse name: Not on file   Number of children: 2   Years of education: Not on file   Highest education level: Not on file  Occupational History   Occupation: Retired  Tobacco Use   Smoking status: Every Day    Current packs/day: 0.50    Types: Cigarettes   Smokeless tobacco: Never  Vaping Use   Vaping status: Never Used  Substance and Sexual Activity   Alcohol use: No    Alcohol/week: 0.0 standard drinks of alcohol   Drug use: No   Sexual activity: Not on file  Other Topics Concern   Not on file  Social History Narrative   Not on file   Social Drivers of Health   Financial Resource Strain: Not on file  Food Insecurity: No Food Insecurity (05/25/2023)   Hunger Vital Sign    Worried About Running Out of Food in the Last Year:  Never true    Ran Out of Food in the Last Year: Never true  Transportation Needs: No Transportation Needs (05/25/2023)   PRAPARE - Transportation    Lack of Transportation (Medical): No    Lack of Transportation (Non-Medical): No  Physical Activity: Not on file  Stress: Not on file  Social Connections: Socially  Isolated (05/26/2023)   Social Connection and Isolation Panel [NHANES]    Frequency of Communication with Friends and Family: Never    Frequency of Social Gatherings with Friends and Family: Never    Attends Religious Services: Never    Database administrator or Organizations: No    Attends Banker Meetings: Never    Marital Status: Divorced  Catering manager Violence: Not At Risk (05/25/2023)   Humiliation, Afraid, Rape, and Kick questionnaire    Fear of Current or Ex-Partner: No    Emotionally Abused: No    Physically Abused: No    Sexually Abused: No    Review of Systems: Pertinent positive and negative review of systems were noted in the above HPI section.  All other review of systems was otherwise negative.   Physical Exam: Vital signs in last 24 hours: Temp:  [98.2 F (36.8 C)-99.1 F (37.3 C)] 99.1 F (37.3 C) (04/23 0825) Pulse Rate:  [27-134] 73 (04/23 0825) Resp:  [10-23] 18 (04/23 0825) BP: (74-150)/(38-87) 109/70 (04/23 0825) SpO2:  [97 %-100 %] 100 % (04/23 0825) Weight:  [54.3 kg] 54.3 kg (04/22 1205) Last BM Date : 05/25/23 General:   Alert,  Well-developed, well-nourished, older African-American female pleasant and cooperative in NAD Head:  Normocephalic and atraumatic. Eyes:  Sclera clear, no icterus.   Conjunctiva pale Ears:  Normal auditory acuity. Nose:  No deformity, discharge,  or lesions. Mouth:  No deformity or lesions.   Neck:  Supple; no masses or thyromegaly. Lungs:   Clear throughout to auscultation.   No wheezes, crackles, or rhonchi. Heart:  Regular rate and rhythm; no murmurs, clicks, rubs,  or gallops. Abdomen:   Soft,nontender, BS active,nonpalp mass or hsm.   Rectal: Not done heme positive in the ER and melena noted Msk:  Symmetrical without gross deformities. . Pulses:  Normal pulses noted. Extremities:  Without clubbing or edema. Neurologic:  Alert and  oriented x4;  grossly normal neurologically. Skin:  Intact without significant lesions or rashes.. Psych:  Alert and cooperative. Normal mood and affect.  Intake/Output from previous day: 04/22 0701 - 04/23 0700 In: 326 [Blood:326] Out: -  Intake/Output this shift: No intake/output data recorded.  Lab Results: Recent Labs    05/25/23 1253 05/25/23 1305 05/26/23 0131 05/26/23 0838  WBC 6.4  --  5.5 5.6  HGB 7.2* 7.1* 6.0* 7.4*  HCT 23.2* 21.0* 19.3* 23.0*  PLT 277  --  235 238   BMET Recent Labs    05/25/23 1253 05/25/23 1305 05/26/23 0131  NA 135 136 136  K 3.2* 3.1* 3.8  CL 94* 91* 95*  CO2 28  --  30  GLUCOSE 221* 219* 201*  BUN 11 11 15   CREATININE 2.39* 2.50* 3.41*  CALCIUM  7.9*  --  7.7*   LFT Recent Labs    05/25/23 1253  PROT 6.2*  ALBUMIN  3.4*  AST 16  ALT 8  ALKPHOS 387*  BILITOT 0.4   PT/INR No results for input(s): "LABPROT", "INR" in the last 72 hours. Hepatitis Panel No results for input(s): "HEPBSAG", "HCVAB", "HEPAIGM", "HEPBIGM" in the last 72 hours.   IMPRESSION:  #39 73 year old African-American female with history of end-stage renal disease on dialysis, admitted after dialysis yesterday when she developed hypotension, diaphoresis and then had a large melenic appearing stool.  Found to be in A-fib with RVR in the ER, hypokalemic, elevated lactate and with hemoglobin of 7.2 down from her baseline in the 11 range from March 2025  She was volume resuscitated in the ER, spontaneously converted to normal sinus rhythm, electrolytes replaced and has been hemodynamically stable. No further active bleeding  Status post 1 unit of packed RBCs.  Patient does have history of anemia and intermittent  chronic blood loss secondary to previously documented AVMs.  She had capsule endoscopy and enteroscopy in September 2024 with finding of 3 duodenal AVMs which were treated with APC, and 1 small jejunal polyp which was removed.  At capsule endoscopy was noted to have a couple of tiny AVMs in her more distal small bowel at least 90 minutes beyond the first duodenal image.  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  #2 history of adenomatous colon polyps-colonoscopy here 2019, had a colonoscopy through [facility] in August 2024 reportedly negative per patient and told to follow-up in 5 years.  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 past midnight Continue to trend hemoglobin and transfuse to keep her hemoglobin above 7 Patient has been scheduled for enteroscopy with Dr. Leonia Raman tomorrow 05/27/2023 in a.m.  Procedure has been discussed  the patient in detail including indications risks and benefits and she is agreeable to proceed. Hopefully her dialysis can be scheduled for after the procedure tomorrow. Further recommendations pending findings at enteroscopy.   Amy Esterwood PA-C 05/26/2023, 11:15 AM

## 2023-05-26 NOTE — Progress Notes (Addendum)
 Pt had a CBG of 253. Made MD aware, instructed to monitor. Pt states she doesn't monitor CBG at home or any other time.

## 2023-05-26 NOTE — Plan of Care (Signed)
   Problem: Education: Goal: Knowledge of General Education information will improve Description: Including pain rating scale, medication(s)/side effects and non-pharmacologic comfort measures Outcome: Progressing   Problem: Activity: Goal: Risk for activity intolerance will decrease Outcome: Progressing   Problem: Nutrition: Goal: Adequate nutrition will be maintained Outcome: Progressing   Problem: Coping: Goal: Level of anxiety will decrease Outcome: Progressing   Problem: Elimination: Goal: Will not experience complications related to bowel motility Outcome: Progressing   Problem: Safety: Goal: Ability to remain free from injury will improve Outcome: Progressing

## 2023-05-26 NOTE — Plan of Care (Signed)
  Problem: Clinical Measurements: Goal: Ability to maintain clinical measurements within normal limits will improve Outcome: Progressing   Problem: Activity: Goal: Risk for activity intolerance will decrease Outcome: Progressing   Problem: Coping: Goal: Level of anxiety will decrease Outcome: Progressing   Problem: Nutrition: Goal: Adequate nutrition will be maintained Outcome: Progressing

## 2023-05-26 NOTE — Progress Notes (Signed)
 Pt NPO at 0000 for procedure on 05/26/2023. Food and beverages removed from room at this time.

## 2023-05-27 ENCOUNTER — Encounter (HOSPITAL_COMMUNITY): Payer: Self-pay | Admitting: Internal Medicine

## 2023-05-27 ENCOUNTER — Observation Stay (HOSPITAL_COMMUNITY): Payer: Self-pay | Admitting: Anesthesiology

## 2023-05-27 ENCOUNTER — Observation Stay (HOSPITAL_COMMUNITY): Payer: Medicare (Managed Care)

## 2023-05-27 ENCOUNTER — Encounter (HOSPITAL_COMMUNITY): Admission: EM | Disposition: A | Discharge status: Home / Self Care | Payer: Self-pay | Source: Home / Self Care

## 2023-05-27 DIAGNOSIS — E872 Acidosis, unspecified: Secondary | ICD-10-CM | POA: Diagnosis present

## 2023-05-27 DIAGNOSIS — K31811 Angiodysplasia of stomach and duodenum with bleeding: Secondary | ICD-10-CM | POA: Diagnosis present

## 2023-05-27 DIAGNOSIS — I472 Ventricular tachycardia, unspecified: Secondary | ICD-10-CM | POA: Diagnosis present

## 2023-05-27 DIAGNOSIS — T182XXA Foreign body in stomach, initial encounter: Secondary | ICD-10-CM

## 2023-05-27 DIAGNOSIS — I2489 Other forms of acute ischemic heart disease: Secondary | ICD-10-CM | POA: Diagnosis present

## 2023-05-27 DIAGNOSIS — Z604 Social exclusion and rejection: Secondary | ICD-10-CM | POA: Diagnosis present

## 2023-05-27 DIAGNOSIS — K922 Gastrointestinal hemorrhage, unspecified: Secondary | ICD-10-CM | POA: Diagnosis not present

## 2023-05-27 DIAGNOSIS — E785 Hyperlipidemia, unspecified: Secondary | ICD-10-CM | POA: Diagnosis present

## 2023-05-27 DIAGNOSIS — D472 Monoclonal gammopathy: Secondary | ICD-10-CM | POA: Diagnosis present

## 2023-05-27 DIAGNOSIS — D649 Anemia, unspecified: Secondary | ICD-10-CM | POA: Diagnosis not present

## 2023-05-27 DIAGNOSIS — Z992 Dependence on renal dialysis: Secondary | ICD-10-CM | POA: Diagnosis not present

## 2023-05-27 DIAGNOSIS — F17210 Nicotine dependence, cigarettes, uncomplicated: Secondary | ICD-10-CM | POA: Diagnosis present

## 2023-05-27 DIAGNOSIS — Z886 Allergy status to analgesic agent status: Secondary | ICD-10-CM | POA: Diagnosis not present

## 2023-05-27 DIAGNOSIS — N186 End stage renal disease: Secondary | ICD-10-CM | POA: Diagnosis present

## 2023-05-27 DIAGNOSIS — I4892 Unspecified atrial flutter: Secondary | ICD-10-CM | POA: Diagnosis present

## 2023-05-27 DIAGNOSIS — Z860101 Personal history of adenomatous and serrated colon polyps: Secondary | ICD-10-CM | POA: Diagnosis not present

## 2023-05-27 DIAGNOSIS — Z90710 Acquired absence of both cervix and uterus: Secondary | ICD-10-CM | POA: Diagnosis not present

## 2023-05-27 DIAGNOSIS — N2581 Secondary hyperparathyroidism of renal origin: Secondary | ICD-10-CM | POA: Diagnosis present

## 2023-05-27 DIAGNOSIS — K921 Melena: Secondary | ICD-10-CM | POA: Diagnosis not present

## 2023-05-27 DIAGNOSIS — M898X9 Other specified disorders of bone, unspecified site: Secondary | ICD-10-CM | POA: Diagnosis present

## 2023-05-27 DIAGNOSIS — I4891 Unspecified atrial fibrillation: Secondary | ICD-10-CM | POA: Diagnosis present

## 2023-05-27 DIAGNOSIS — E876 Hypokalemia: Secondary | ICD-10-CM | POA: Diagnosis present

## 2023-05-27 DIAGNOSIS — W44F1XA Bezoar entering into or through a natural orifice, initial encounter: Secondary | ICD-10-CM | POA: Diagnosis present

## 2023-05-27 DIAGNOSIS — R079 Chest pain, unspecified: Secondary | ICD-10-CM | POA: Diagnosis not present

## 2023-05-27 DIAGNOSIS — I120 Hypertensive chronic kidney disease with stage 5 chronic kidney disease or end stage renal disease: Secondary | ICD-10-CM | POA: Diagnosis present

## 2023-05-27 DIAGNOSIS — E213 Hyperparathyroidism, unspecified: Secondary | ICD-10-CM | POA: Diagnosis present

## 2023-05-27 DIAGNOSIS — Z8249 Family history of ischemic heart disease and other diseases of the circulatory system: Secondary | ICD-10-CM | POA: Diagnosis not present

## 2023-05-27 DIAGNOSIS — I953 Hypotension of hemodialysis: Secondary | ICD-10-CM | POA: Diagnosis present

## 2023-05-27 DIAGNOSIS — E1122 Type 2 diabetes mellitus with diabetic chronic kidney disease: Secondary | ICD-10-CM | POA: Diagnosis present

## 2023-05-27 HISTORY — PX: HOT HEMOSTASIS: SHX5433

## 2023-05-27 HISTORY — PX: ESOPHAGOGASTRODUODENOSCOPY: SHX5428

## 2023-05-27 LAB — RENAL FUNCTION PANEL
Albumin: 3 g/dL — ABNORMAL LOW (ref 3.5–5.0)
Anion gap: 12 (ref 5–15)
BUN: 28 mg/dL — ABNORMAL HIGH (ref 8–23)
CO2: 27 mmol/L (ref 22–32)
Calcium: 8.1 mg/dL — ABNORMAL LOW (ref 8.9–10.3)
Chloride: 99 mmol/L (ref 98–111)
Creatinine, Ser: 5.33 mg/dL — ABNORMAL HIGH (ref 0.44–1.00)
GFR, Estimated: 8 mL/min — ABNORMAL LOW (ref >60)
Glucose, Bld: 147 mg/dL — ABNORMAL HIGH (ref 70–99)
Phosphorus: 3 mg/dL (ref 2.5–4.6)
Potassium: 4.2 mmol/L (ref 3.5–5.1)
Sodium: 138 mmol/L (ref 135–145)

## 2023-05-27 LAB — CBC
HCT: 23.7 % — ABNORMAL LOW (ref 36.0–46.0)
Hemoglobin: 7.6 g/dL — ABNORMAL LOW (ref 12.0–15.0)
MCH: 29.3 pg (ref 26.0–34.0)
MCHC: 32.1 g/dL (ref 30.0–36.0)
MCV: 91.5 fL (ref 80.0–100.0)
Platelets: 234 10*3/uL (ref 150–400)
RBC: 2.59 MIL/uL — ABNORMAL LOW (ref 3.87–5.11)
RDW: 14.5 % (ref 11.5–15.5)
WBC: 6.1 10*3/uL (ref 4.0–10.5)
nRBC: 0 % (ref 0.0–0.2)

## 2023-05-27 LAB — GLUCOSE, CAPILLARY
Glucose-Capillary: 134 mg/dL — ABNORMAL HIGH (ref 70–99)
Glucose-Capillary: 145 mg/dL — ABNORMAL HIGH (ref 70–99)
Glucose-Capillary: 140 mg/dL — ABNORMAL HIGH (ref 70–99)
Glucose-Capillary: 127 mg/dL — ABNORMAL HIGH (ref 70–99)
Glucose-Capillary: 102 mg/dL — ABNORMAL HIGH (ref 70–99)
Glucose-Capillary: 205 mg/dL — ABNORMAL HIGH (ref 70–99)

## 2023-05-27 LAB — TYPE AND SCREEN
ABO/RH(D): O POS
Antibody Screen: NEGATIVE
Unit division: 0

## 2023-05-27 LAB — BPAM RBC
Blood Product Expiration Date: 202505152359
ISSUE DATE / TIME: 202504230223
Unit Type and Rh: 5100

## 2023-05-27 SURGERY — EGD (ESOPHAGOGASTRODUODENOSCOPY)
Anesthesia: Monitor Anesthesia Care

## 2023-05-27 MED ORDER — LIDOCAINE 2% (20 MG/ML) 5 ML SYRINGE
INTRAMUSCULAR | Status: DC | PRN
Start: 2023-05-27 — End: 2023-05-27
  Administered 2023-05-27: 20 mg via INTRAVENOUS

## 2023-05-27 MED ORDER — GLYCOPYRROLATE PF 0.2 MG/ML IJ SOSY
PREFILLED_SYRINGE | INTRAMUSCULAR | Status: DC | PRN
  Administered 2023-05-27: .1 mg via INTRAVENOUS

## 2023-05-27 MED ORDER — PROPOFOL 500 MG/50ML IV EMUL
INTRAVENOUS | Status: DC | PRN
  Administered 2023-05-27: 150 ug/kg/min via INTRAVENOUS

## 2023-05-27 MED ORDER — SODIUM CHLORIDE 0.9 % IV SOLN: INTRAVENOUS | Status: DC | PRN

## 2023-05-27 MED ORDER — PANTOPRAZOLE SODIUM 40 MG PO TBEC
40.0000 mg | DELAYED_RELEASE_TABLET | Freq: Two times a day (BID) | ORAL | Status: DC
  Administered 2023-05-27 – 2023-05-28 (×2): 40 mg via ORAL
  Filled 2023-05-27 (×2): qty 1

## 2023-05-27 MED ORDER — PROPOFOL 10 MG/ML IV BOLUS
INTRAVENOUS | Status: DC | PRN
  Administered 2023-05-27: 10 mg via INTRAVENOUS
  Administered 2023-05-27: 20 mg via INTRAVENOUS
  Administered 2023-05-27: 10 mg via INTRAVENOUS
  Administered 2023-05-27: 20 mg via INTRAVENOUS

## 2023-05-27 MED ORDER — DARBEPOETIN ALFA 60 MCG/0.3ML IJ SOSY
60.0000 ug | PREFILLED_SYRINGE | INTRAMUSCULAR | Status: DC
  Administered 2023-05-27: 60 ug via SUBCUTANEOUS
  Filled 2023-05-27: qty 0.3

## 2023-05-27 NOTE — Transfer of Care (Signed)
 Immediate Anesthesia Transfer of Care Note  Patient: Sara Davis  Procedure(s) Performed: EGD (ESOPHAGOGASTRODUODENOSCOPY) EGD, WITH ARGON PLASMA COAGULATION  Patient Location: PACU  Anesthesia Type:MAC  Level of Consciousness: drowsy  Airway & Oxygen Therapy: Patient Spontanous Breathing and Patient connected to face mask oxygen  Post-op Assessment: Report given to RN and Post -op Vital signs reviewed and stable  Post vital signs: Reviewed and stable  Last Vitals:  Vitals Value Taken Time  BP 115/52 05/27/23 1124  Temp    Pulse 71 05/27/23 1126  Resp 21 05/27/23 1126  SpO2 100 % 05/27/23 1126  Vitals shown include unfiled device data.  Last Pain:  Vitals:   05/27/23 0948  TempSrc: Temporal  PainSc: 0-No pain         Complications: No notable events documented.

## 2023-05-27 NOTE — Anesthesia Postprocedure Evaluation (Signed)
 Anesthesia Post Note  Patient: Sara Davis  Procedure(s) Performed: EGD (ESOPHAGOGASTRODUODENOSCOPY) EGD, WITH ARGON PLASMA COAGULATION     Patient location during evaluation: Endoscopy Anesthesia Type: MAC Level of consciousness: awake and alert Pain management: pain level controlled Vital Signs Assessment: post-procedure vital signs reviewed and stable Respiratory status: spontaneous breathing, nonlabored ventilation, respiratory function stable and patient connected to nasal cannula oxygen Cardiovascular status: blood pressure returned to baseline and stable Postop Assessment: no apparent nausea or vomiting Anesthetic complications: no  No notable events documented.  Last Vitals:  Vitals:   05/27/23 1140 05/27/23 1224  BP: (!) 117/59 (!) 150/67  Pulse: 74 70  Resp: 20   Temp: 36.6 C 36.7 C  SpO2: 98% 100%    Last Pain:  Vitals:   05/27/23 1224  TempSrc: Oral  PainSc:                  [name]

## 2023-05-27 NOTE — Progress Notes (Signed)
 Attemtped 2D Echo, patient in endo for procedure. Will attempt once back to room.

## 2023-05-27 NOTE — Plan of Care (Signed)
   Problem: Education: Goal: Knowledge of General Education information will improve Description Including pain rating scale, medication(s)/side effects and non-pharmacologic comfort measures Outcome: Progressing   Problem: Health Behavior/Discharge Planning: Goal: Ability to manage health-related needs will improve Outcome: Progressing

## 2023-05-27 NOTE — Progress Notes (Signed)
   05/27/23 1729  Vitals  Temp 97.6 F (36.4 C)  Pulse Rate 66  Resp 18  BP (!) 153/63  SpO2 99 %  O2 Device Room Air  Weight 56.8 kg  Type of Weight Post-Dialysis  Oxygen Therapy  Patient Activity (if Appropriate) In bed  Pulse Oximetry Type Continuous  Oximetry Probe Site Changed No  Post Treatment  Dialyzer Clearance Lightly streaked  Hemodialysis Intake (mL) 0 mL  Liters Processed 37.5  Fluid Removed (mL) 0 mL  Tolerated HD Treatment No (Comment) (Pt AMA due to cramping after UF turned off.)  Post-Hemodialysis Comments PA-Stovall notified concerning pt AMA. Pt signed AMA form-in chart  AVG/AVF Arterial Site Held (minutes) 5 minutes  AVG/AVF Venous Site Held (minutes) 5 minutes   Received patient in bed to unit.  Alert and oriented.  Informed consent signed and in chart.   TX duration: 1 hour and 34 minutes  Patient tolerated well.  Transported back to the room  Alert, without acute distress.  Hand-off given to patient's nurse.   Access used: right upper arm fistula Access issues: none

## 2023-05-27 NOTE — Progress Notes (Signed)
 PT Cancellation Note  Patient Details Name: Sara Davis MRN: [id number] DOB: 1950-07-09   Cancelled Treatment:    Reason Eval/Treat Not Completed: Patient at procedure or test/unavailable; will follow up later as pt available and schedule permits.     Marley Simmers 05/27/2023, 10:04 AM Abigail Hoff, PT Acute Rehabilitation Services Office:[phone] 05/27/2023

## 2023-05-27 NOTE — Progress Notes (Signed)
                  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 She has had several EGDs in the past and has no questions about the procedure. She is feeling well and is eager to be discharged when possible  Objective:  Vital signs in last 24 hours: Vitals:   05/26/23 2044 05/27/23 0501 05/27/23 0845 05/27/23 0948  BP: (!) 122/58 (!) 124/58 (!) 137/59 (!) 145/81  Pulse: 70 68 66 68  Resp: 16 16 17 16   Temp: 98.2 F (36.8 C) 98.1 F (36.7 C) 98.1 F (36.7 C) 98 F (36.7 C)  TempSrc:  Oral Oral Temporal  SpO2:   98% 100%  Weight:      Height:       Supplemental O2: Room Air SpO2: 100 %  Physical Exam:  Constitutional: elderly female, low BMI, no acute distress. Conversational Cardiovascular: RRR Neuro: A&O x 3  Assessment/Plan:   Acute on chronic anemia Hx AVMs Hgb stable at 7.6 this am, similar to previous. She has been NPO since midnight for enteroscopy today. No symptoms of acute anemia - Enteroscopy today - SCDs for DVT ppx  ESRD on HD Nephrology consulted for HD needs. Will receive HD today to continue her home T/Th/S schedule. Other recommendations per nephrology  Weakness, improved Hypotension, resolved Atrial fibrillation with RVR, resolved Patient remains in NSR and normotensive. She feels she is at her baseline functional status. Will continue to monitor symptoms and encourage po intake when a diet is reordered  Hypokalemia, resolved: Potassium has been within normal limits after repletion. Continue to monitor R middle lobe opacity: Continues to be afebrile with no supplemental oxygen requirement. No indication for antibiotics  Elevated troponin: Asymptomatic, low suspicion for ACS. Likely demand ischemia due to hypotension Elevated lactate: Resolved to normal  limits with IV fluids  Diet: NPO since midnight VTE: SCD Code: Full  Dispo: Anticipated discharge to Home pending medical stability.   Jayson Michael, MD PGY-1 Internal Medicine Resident Pager Number [phone] Please contact the on call pager after 5 pm and on weekends at [phone].

## 2023-05-27 NOTE — Progress Notes (Signed)
 Sara Davis  Assessment/ Plan: Pt is a 73 y.o. yo female   with ESRD, HTN, T2DM, smoldering MM, Hx GIB, Hx recent L inferior parathyroid  glad resection who was admitted with weakness/anemia, concern for possible GIB.  Dialysis Orders:  TTS - GOC 3:45hr, 400/700, EDW 53.5kg, 2K/2Ca, RUE AVG, no heparin  - Mircear 150mcg IV q 2 weeks (last given 75mcg on 4/10) - Calcitriol  2.73mcg PO q HD - Sensipar 30mg  Poq HD ---> Last PTH 1236 ---> Hgb dropping as outpatient: 10.9 (4/3), 9.4 (4/10), 8.1 (4/19)  # Presumed UGIB: Positive stool guaiacs, weakness. Hx GIB.  Received blood transfusion and for GI procedure today. Dose aranesp  today.  # ESRD: Continue usual TTS schedule -> HD today, no heparin .  # Hypertension/volume: BP ok, no edema on exam.  # Metabolic bone disease: Monitor calcium  and phosphorus, resume home medication. #A-fib RVR: On admit, resolved  Subjective: Seen and examined at bedside.  Patient is going to endoscopy today.  Denies nausea, vomiting, chest pain, shortness of breath.  HD today. Objective Vital signs in last 24 hours: Vitals:   05/26/23 2044 05/27/23 0501 05/27/23 0845 05/27/23 0948  BP: (!) 122/58 (!) 124/58 (!) 137/59 (!) 145/81  Pulse: 70 68 66 68  Resp: 16 16 17 16   Temp: 98.2 F (36.8 C) 98.1 F (36.7 C) 98.1 F (36.7 C) 98 F (36.7 C)  TempSrc:  Oral Oral Temporal  SpO2:   98% 100%  Weight:      Height:       Weight change:   Intake/Output Summary (Last 24 hours) at 05/27/2023 1015 Last data filed at 05/26/2023 1700 Gross per 24 hour  Intake 240 ml  Output --  Net 240 ml       Labs: RENAL PANEL Recent Labs  Lab 05/25/23 1253 05/25/23 1305 05/26/23 0131 05/27/23 0641  NA 135 136 136 138  K 3.2* 3.1* 3.8 4.2  CL 94* 91* 95* 99  CO2 28  --  30 27  GLUCOSE 221* 219* 201* 147*  BUN 11 11 15  28*  CREATININE 2.39* 2.50* 3.41* 5.33*  CALCIUM  7.9*  --  7.7* 8.1*  MG 2.4  --   --   --   PHOS  --    --   --  3.0  ALBUMIN  3.4*  --   --  3.0*    Liver Function Tests: Recent Labs  Lab 05/25/23 1253 05/27/23 0641  AST 16  --   ALT 8  --   ALKPHOS 387*  --   BILITOT 0.4  --   PROT 6.2*  --   ALBUMIN  3.4* 3.0*   No results for input(s): "LIPASE", "AMYLASE" in the last 168 hours. No results for input(s): "AMMONIA" in the last 168 hours. CBC: Recent Labs    10/17/22 1528 10/17/22 1758 11/04/22 1416 11/16/22 1000 12/18/22 1133 12/18/22 1134 12/25/22 1028 05/25/23 1253 05/25/23 1305 05/26/23 0131 05/26/23 0838 05/27/23 0641  HGB  --    < > 9.9*   < > 7.6*  --    < > 7.2* 7.1* 6.0* 7.4* 7.6*  MCV  --    < > 94.9   < > 92.2  --    < > 90.3  --  89.8 90.2 91.5  VITAMINB12 188  --   --   --  255  --   --   --   --   --   --   --   FOLATE  4.4*  --   --   --   --  9.2  --   --   --   --   --   --   FERRITIN 323*  --  554*  --   --   --   --   --   --   --   --   --   TIBC 309  --  301  --   --   --   --   --   --   --   --   --   IRON  70  --  71  --   --   --   --   --   --   --   --   --   RETICCTPCT  --   --  1.8  --   --   --   --   --   --   --   --   --    < > = values in this interval not displayed.    Cardiac Enzymes: No results for input(s): "CKTOTAL", "CKMB", "CKMBINDEX", "TROPONINI" in the last 168 hours. CBG: Recent Labs  Lab 05/26/23 1217 05/26/23 1554 05/26/23 2203 05/27/23 0300 05/27/23 0846  GLUCAP 139* 165* 199* 134* 145*    Iron  Studies: No results for input(s): "IRON ", "TIBC", "TRANSFERRIN", "FERRITIN" in the last 72 hours. Studies/Results: DG Chest Portable 1 View Result Date: 05/25/2023 CLINICAL DATA:  Hypotension EXAM: PORTABLE CHEST 1 VIEW COMPARISON:  October 17, 2022 FINDINGS: Ill-defined right middle lobe infiltrates with prominence of the interstitial markings bilaterally indicates some chronic interstitial lung disease and right middle lobe pneumonia Heart and mediastinum normal without congestive changes IMPRESSION: Right middle lobe  pneumonia. Electronically Signed   By: Fredrich Jefferson M.D.   On: 05/25/2023 14:10    Medications: Infusions:   Scheduled Medications:  [MAR Hold] atorvastatin   80 mg Oral Daily   [MAR Hold] calcitRIOL   2 mcg Oral BID   [MAR Hold] calcium  carbonate  4 tablet Oral TID AC & HS   [MAR Hold] carvedilol   25 mg Oral BID WC   [MAR Hold] Chlorhexidine  Gluconate Cloth  6 each Topical Q0600   [MAR Hold] multivitamin  1 tablet Oral QHS   [MAR Hold] pantoprazole  (PROTONIX ) IV  40 mg Intravenous Q12H    have reviewed scheduled and prn medications.  Physical Exam: General:NAD, comfortable Heart:RRR, s1s2 nl Lungs:clear b/l, no crackle Abdomen:soft, Non-tender, non-distended Extremities:No edema Dialysis Access: AV graft  Unknown Sara Davis Parr 05/27/2023,10:15 AM  LOS: 0 days

## 2023-05-27 NOTE — Plan of Care (Signed)
  Problem: Clinical Measurements: Goal: Will remain free from infection Outcome: Progressing   Problem: Clinical Measurements: Goal: Diagnostic test results will improve Outcome: Progressing   Problem: Clinical Measurements: Goal: Respiratory complications will improve Outcome: Progressing   Problem: Nutrition: Goal: Adequate nutrition will be maintained Outcome: Progressing   Problem: Coping: Goal: Level of anxiety will decrease Outcome: Progressing   Problem: Pain Managment: Goal: General experience of comfort will improve and/or be controlled Outcome: Progressing   Problem: Pain Managment: Goal: General experience of comfort will improve and/or be controlled Outcome: Progressing   Problem: Safety: Goal: Ability to remain free from injury will improve Outcome: Progressing   Problem: Skin Integrity: Goal: Risk for impaired skin integrity will decrease Outcome: Progressing

## 2023-05-27 NOTE — Interval H&P Note (Signed)
 History and Physical Interval Note:  05/27/2023 10:38 AM  Sara Davis  has presented today for surgery, with the diagnosis of Melena, anemia, history of AVMs.  The various methods of treatment have been discussed with the patient and family. After consideration of risks, benefits and other options for treatment, the patient has consented to  Procedure(s): ENTEROSCOPY (N/A) as a surgical intervention.  The patient's history has been reviewed, patient examined, no change in status, stable for surgery.  I have reviewed the patient's chart and labs.  Questions were answered to the patient's satisfaction.     Kiano Terrien

## 2023-05-27 NOTE — Op Note (Signed)
 [facility] Patient Name: Sara Davis Procedure Date : 05/27/2023 MRN: [id number] Attending MD: Sergio Dandy , MD, [id number] Date of Birth: 1950/05/04 CSN: [id number] Age: 73 Admit Type: Inpatient Procedure:                Small bowel enteroscopy Indications:              Melena Providers:                Sergio Dandy, MD, Millicent Ally, RN, Joline Ned, Technician Referring MD:              Medicines:                Monitored Anesthesia Care Complications:            No immediate complications. Estimated Blood Loss:     Estimated blood loss was minimal. Procedure:                Pre-Anesthesia Assessment:                           - Prior to the procedure, a History and Physical                            was performed, and patient medications and                            allergies were reviewed. The patient's tolerance of                            previous anesthesia was also reviewed. The risks                            and benefits of the procedure and the sedation                            options and risks were discussed with the patient.                            All questions were answered, and informed consent                            was obtained. Prior Anticoagulants: The patient has                            taken no anticoagulant or antiplatelet agents. ASA                            Grade Assessment: III - A patient with severe                            systemic disease. After reviewing the risks and  benefits, the patient was deemed in satisfactory                            condition to undergo the procedure.                           After obtaining informed consent, the endoscope was                            passed under direct vision. Throughout the                            procedure, the patient's blood pressure, pulse, and                            oxygen saturations  were monitored continuously. The                            PCF-H190TL ([id number]) Olympus slim colonoscope was                            introduced through the mouth and advanced to the                            second part of duodenum. The small bowel                            enteroscopy was accomplished without difficulty.                            The patient tolerated the procedure well. Scope In: Scope Out: Findings:      The esophagus was normal.      A medium amount of a phytobezoar was found in the gastric body. Scope       was only extended to second portion of duodenum, did not do the push       enteroscopy to decrease risk for aspiration      Three angioectasias with stigmata of recent bleeding were found in the       first portion of the duodenum. Coagulation for hemostasis using argon       plasma was successful. Impression:               - Normal esophagus.                           - A medium amount of a phytobezoar in the stomach.                           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 Treated with argon plasma coagulation                            (APC).                           -  No specimens collected. Recommendation:           - Clear liquid diet.                           - Follow an antireflux regimen.                           - Monitor Hgb and transfuse as needed                           - If has further decline in hgb, will plan for                            small bowel video capsule study tomorrow to evaluate                           - Pantoprazole  40mg  BID Procedure Code(s):        --- Professional ---                           [phone], Esophagogastroduodenoscopy, flexible,                            transoral; with control of bleeding, any method Diagnosis Code(s):        --- Professional ---                           W29.5AOZ, Foreign body in stomach, initial encounter                            K31.811, Angiodysplasia of stomach and duodenum                            with bleeding                           K92.1, Melena (includes Hematochezia) CPT copyright 2022 American [employer] Association. All rights reserved. The codes documented in this report are preliminary and upon coder review may  be revised to meet current compliance requirements. Hall Birchard V. Elona Yinger, MD 05/27/2023 11:40:39 AM This report has been signed electronically. Number of Addenda: 0

## 2023-05-27 NOTE — Progress Notes (Signed)
 Pt receives out-pt HD at [facility] on TTS. Will assist as needed.   Olivia Canter Renal Navigator [phone]

## 2023-05-27 NOTE — Progress Notes (Signed)
 PT Cancellation Note  Patient Details Name: ZYLAH ELSBERND MRN: [id number] DOB: 1950-07-08   Cancelled Treatment:    Reason Eval/Treat Not Completed: Patient at procedure or test/unavailable; was down for EGD, now in HD.  Will follow up another day.   Marley Simmers 05/27/2023, 3:13 PM Abigail Hoff, PT Acute Rehabilitation Services [phone] 05/27/2023

## 2023-05-27 NOTE — Progress Notes (Signed)
   05/27/23 0121  Provider Notification  Provider Name/Title Raj Burdock  Date Provider Notified 05/27/23  Time Provider Notified 0122  Method of Notification Page  Notification Reason Other (Comment) (Patient in [address] Leamon Pringle need tele order renew,)  Provider response See new orders  Date of Provider Response 05/27/23  Time of Provider Response [phone]

## 2023-05-28 ENCOUNTER — Inpatient Hospital Stay (HOSPITAL_COMMUNITY): Payer: Medicare (Managed Care)

## 2023-05-28 ENCOUNTER — Other Ambulatory Visit (HOSPITAL_COMMUNITY): Payer: Self-pay

## 2023-05-28 ENCOUNTER — Encounter (HOSPITAL_COMMUNITY): Payer: Self-pay

## 2023-05-28 DIAGNOSIS — R079 Chest pain, unspecified: Secondary | ICD-10-CM | POA: Diagnosis not present

## 2023-05-28 LAB — FERRITIN: Ferritin: 899 ng/mL — ABNORMAL HIGH (ref 11–307)

## 2023-05-28 LAB — IRON AND TIBC
Iron: 41 ug/dL (ref 28–170)
Saturation Ratios: 18 % (ref 10.4–31.8)
TIBC: 232 ug/dL — ABNORMAL LOW (ref 250–450)
UIBC: 191 ug/dL

## 2023-05-28 LAB — ECHOCARDIOGRAM COMPLETE
Height: 66 in
S' Lateral: 2.9 cm
Weight: 2003.54 [oz_av]

## 2023-05-28 LAB — CBC
HCT: 23.1 % — ABNORMAL LOW (ref 36.0–46.0)
Hemoglobin: 7.4 g/dL — ABNORMAL LOW (ref 12.0–15.0)
MCH: 29 pg (ref 26.0–34.0)
MCHC: 32 g/dL (ref 30.0–36.0)
MCV: 90.6 fL (ref 80.0–100.0)
Platelets: 239 10*3/uL (ref 150–400)
RBC: 2.55 MIL/uL — ABNORMAL LOW (ref 3.87–5.11)
RDW: 14 % (ref 11.5–15.5)
WBC: 6.2 10*3/uL (ref 4.0–10.5)
nRBC: 0 % (ref 0.0–0.2)

## 2023-05-28 LAB — GLUCOSE, CAPILLARY
Glucose-Capillary: 110 mg/dL — ABNORMAL HIGH (ref 70–99)
Glucose-Capillary: 127 mg/dL — ABNORMAL HIGH (ref 70–99)

## 2023-05-28 MED ORDER — PANTOPRAZOLE SODIUM 40 MG PO TBEC
40.0000 mg | DELAYED_RELEASE_TABLET | Freq: Two times a day (BID) | ORAL | 0 refills | Status: AC
  Filled 2023-05-28: qty 60, 30d supply, fill #0

## 2023-05-28 MED ORDER — CARVEDILOL 25 MG PO TABS
25.0000 mg | ORAL_TABLET | Freq: Two times a day (BID) | ORAL | 3 refills | Status: AC
  Filled 2023-05-28: qty 180, 90d supply, fill #0

## 2023-05-28 NOTE — TOC Progression Note (Addendum)
 Transition of Care (TOC) - Progression Note   OT recommending bedside commode. Entered order and note MD signed.   Ordered with Zachary with [employer]   Patient not wanting to wait any longer for bedside commode, asking for it to be delivered to home. Zachary with [employer] will have it delivered tomorrow. Nurse will tell patient  Patient Details  Name: Sara Davis MRN: [id number] Date of Birth: Mar 04, 1950  Transition of Care [facility]) CM/SW Contact  Izaiha Lo, Arturo Late, RN Phone Number: 05/28/2023, 1:28 PM  Clinical Narrative:            Expected Discharge Plan and Services         Expected Discharge Date: 05/28/23                                     Social Determinants of Health (SDOH) Interventions SDOH Screenings   Food Insecurity: No Food Insecurity (05/25/2023)  Housing: Low Risk  (05/26/2023)  Transportation Needs: No Transportation Needs (05/25/2023)  Utilities: Not At Risk (05/25/2023)  Depression (PHQ2-9): Low Risk  (05/16/2019)  Social Connections: Socially Isolated (05/26/2023)  Tobacco Use: High Risk (05/27/2023)    Readmission Risk Interventions    06/17/2022    1:40 PM  Readmission Risk Prevention Plan  Transportation Screening Complete  PCP or Specialist Appt within 5-7 Days Complete  Home Care Screening Complete  Medication Review (RN CM) Referral to Pharmacy

## 2023-05-28 NOTE — Evaluation (Signed)
 Occupational Therapy Evaluation Patient Details Name: Sara Davis MRN: [id number] DOB: 1950-06-22 Today's Date: 05/28/2023   History of Present Illness   Pt is a 73 y.o. female [facility] for weakness and hypotension post dialysis. 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 PMH: CKD, DM, hyperlipidemia, hyperparathyroidism, HTN, AVMs.     Clinical Impressions Pt admitted based on above, and was seen based on problem list below. PTA pt was independent with ADLs and IADLs. Today pt is requiring set up  to s for safety for ADLs. Bed mobility and functional transfers are  mod I. During session pt highly agitated and insistent on independence. Pt requiring S for safety d/t balance deficits. OT advised pt on use of BSC as a toilet riser or shower chair  to promote safety during functional transfers. Anticipate no follow up OT needs. OT will continue to follow acutely to maximize functional independence.         If plan is discharge home, recommend the following:   A little help with walking and/or transfers;A little help with bathing/dressing/bathroom     Functional Status Assessment   Patient has had a recent decline in their functional status and demonstrates the ability to make significant improvements in function in a reasonable and predictable amount of time.     Equipment Recommendations   BSC/3in1     Recommendations for Other Services         Precautions/Restrictions   Precautions Precautions: Fall Restrictions Weight Bearing Restrictions Per Provider Order: No     Mobility Bed Mobility Overal bed mobility: Independent     Transfers Overall transfer level: Modified independent Equipment used: Straight cane      Balance Overall balance assessment: Needs assistance Sitting-balance support: No upper extremity supported, Feet supported Sitting balance-Leahy Scale: Good     Standing balance support: Single extremity  supported, Reliant on assistive device for balance Standing balance-Leahy Scale: Poor       ADL either performed or assessed with clinical judgement   ADL Overall ADL's : Needs assistance/impaired Eating/Feeding: Set up;Sitting   Grooming: Wash/dry hands;Supervision/safety;Standing Grooming Details (indicate cue type and reason): Able to complete briefly standing at sink no UE support         Upper Body Dressing : Set up;Sitting   Lower Body Dressing: Supervision/safety;Sit to/from stand Lower Body Dressing Details (indicate cue type and reason): Able to reach Bil LEs for sock, s for safety with standing Toilet Transfer: Supervision/safety;Ambulation [facility]) Toilet Transfer Details (indicate cue type and reason): S for safety simulated in room         Functional mobility during ADLs: Supervision/safety;Cane General ADL Comments: S for safety with most ADLs     Vision Baseline Vision/History: 0 No visual deficits Vision Assessment?: No apparent visual deficits            Pertinent Vitals/Pain Pain Assessment Pain Assessment: No/denies pain     Extremity/Trunk Assessment Upper Extremity Assessment Upper Extremity Assessment: Generalized weakness   Lower Extremity Assessment Lower Extremity Assessment: Defer to PT evaluation   Cervical / Trunk Assessment Cervical / Trunk Assessment: Normal   Communication Communication Communication: No apparent difficulties   Cognition Arousal: Alert Behavior During Therapy: Restless, Agitated Cognition: No apparent impairments     Following commands: Intact       Cueing  General Comments   Cueing Techniques: Verbal cues  VSS on RA, noted group of meds in a pill cup on pt's tray table, OT unsure of when/who those  meds came from, RN notified           Home Living Family/patient expects to be discharged to:: Private residence Living Arrangements: Children Available Help at Discharge: Family;Available  PRN/intermittently Type of Home: Apartment Home Access: Stairs to enter Entrance Stairs-Number of Steps: 4 Entrance Stairs-Rails: Can reach both Home Layout: One level     Bathroom Shower/Tub: Chief Strategy Officer: Standard Bathroom Accessibility: No   Home Equipment: Cane - single point          Prior Functioning/Environment Prior Level of Function : Independent/Modified Independent;Driving             Mobility Comments: ambulatory with SPC      OT Problem List: Decreased strength;Decreased range of motion;Decreased activity tolerance;Impaired balance (sitting and/or standing);Decreased safety awareness;Decreased knowledge of use of DME or AE   OT Treatment/Interventions: Self-care/ADL training;Therapeutic exercise;Energy conservation;DME and/or AE instruction;Therapeutic activities;Patient/family education;Balance training      OT Goals(Current goals can be found in the care plan section)   Acute Rehab OT Goals Patient Stated Goal: To go home OT Goal Formulation: With patient Time For Goal Achievement: 06/11/23 Potential to Achieve Goals: Good   OT Frequency:  Min 2X/week       AM-PAC OT "6 Clicks" Daily Activity     Outcome Measure Help from another person eating meals?: None Help from another person taking care of personal grooming?: A Little Help from another person toileting, which includes using toliet, bedpan, or urinal?: A Little Help from another person bathing (including washing, rinsing, drying)?: A Little Help from another person to put on and taking off regular upper body clothing?: A Little Help from another person to put on and taking off regular lower body clothing?: A Little 6 Click Score: 19   End of Session Equipment Utilized During Treatment: Other (comment) [facility]) Nurse Communication: Mobility status  Activity Tolerance: Treatment limited secondary to agitation Patient left: in bed;with call bell/phone within reach  OT  Visit Diagnosis: Muscle weakness (generalized) (M62.81)                Time: 4540-9811 OT Time Calculation (min): 12 min Charges:  OT General Charges $OT Visit: 1 Visit OT Evaluation $OT Eval Low Complexity: 1 Low  Delmer Ferraris, OT  Acute Rehabilitation Services Office [phone] Secure chat preferred   Mickael Alamo 05/28/2023, 1:58 PM

## 2023-05-28 NOTE — Progress Notes (Signed)
  [facility] Progress Note   Subjective: Seen in room. Quite irritable, says she hasn't had anything to eat since Tuesday. Emotional support to pt.     Objective Vitals:   05/27/23 1835 05/27/23 2018 05/28/23 0423 05/28/23 0800  BP: (!) 157/62 (!) 144/55 (!) 145/78 (!) 139/59  Pulse: 67 73 66 64  Resp: 20 18 17 16   Temp:  98 F (36.7 C) 98.4 F (36.9 C) 97.8 F (36.6 C)  TempSrc:  Oral Oral Oral  SpO2: 100% 98% 100% 100%  Weight:      Height:       Physical Exam General: Chronically ill appearing female in NAD Heart: S1,S2 RRR  Lungs: CTAB A/P Abdomen: NABS, NT Extremities: No LE edema Dialysis Access: R AVG + T/B  Additional Objective Labs: Basic Metabolic Panel: Recent Labs  Lab 05/25/23 1253 05/25/23 1305 05/26/23 0131 05/27/23 0641  NA 135 136 136 138  K 3.2* 3.1* 3.8 4.2  CL 94* 91* 95* 99  CO2 28  --  30 27  GLUCOSE 221* 219* 201* 147*  BUN 11 11 15  28*  CREATININE 2.39* 2.50* 3.41* 5.33*  CALCIUM  7.9*  --  7.7* 8.1*  PHOS  --   --   --  3.0   Liver Function Tests: Recent Labs  Lab 05/25/23 1253 05/27/23 0641  AST 16  --   ALT 8  --   ALKPHOS 387*  --   BILITOT 0.4  --   PROT 6.2*  --   ALBUMIN  3.4* 3.0*   No results for input(s): "LIPASE", "AMYLASE" in the last 168 hours. CBC: Recent Labs  Lab 05/25/23 1253 05/25/23 1305 05/26/23 0131 05/26/23 0838 05/27/23 0641 05/28/23 0403  WBC 6.4  --  5.5 5.6 6.1 6.2  HGB 7.2*   < > 6.0* 7.4* 7.6* 7.4*  HCT 23.2*   < > 19.3* 23.0* 23.7* 23.1*  MCV 90.3  --  89.8 90.2 91.5 90.6  PLT 277  --  235 238 234 239   < > = values in this interval not displayed.   Blood Culture    Component Value Date/Time   SDES BLOOD LEFT HAND 06/15/2022 2315   SPECREQUEST  06/15/2022 2315    BOTTLES DRAWN AEROBIC AND ANAEROBIC Blood Culture adequate volume   CULT  06/15/2022 2315    NO GROWTH 5 DAYS Performed at [facility], [address] [place], [place] [address]    REPTSTATUS  06/20/2022 FINAL 06/15/2022 2315    Cardiac Enzymes: No results for input(s): "CKTOTAL", "CKMB", "CKMBINDEX", "TROPONINI" in the last 168 hours. CBG: Recent Labs  Lab 05/27/23 1127 05/27/23 1220 05/27/23 1902 05/27/23 2022 05/28/23 0801  GLUCAP 140* 127* 102* 205* 110*   Iron  Studies:  Recent Labs    05/28/23 0403  IRON  41  TIBC 232*  FERRITIN 899*   @lablastinr3 @ Studies/Results: No results found. Medications:   atorvastatin   80 mg Oral Daily   calcitRIOL   2 mcg Oral BID   calcium  carbonate  4 tablet Oral TID AC & HS   carvedilol   25 mg Oral BID WC   Chlorhexidine  Gluconate Cloth  6 each Topical Q0600   darbepoetin (ARANESP ) injection - DIALYSIS  60 mcg Subcutaneous Q Thu-1800   multivitamin  1 tablet Oral QHS   pantoprazole   40 mg Oral BID     Assessment/ Plan: Pt is a 73 y.o. yo female   with ESRD, HTN, T2DM, smoldering MM, Hx GIB, Hx recent L inferior  parathyroid  glad resection who was admitted with weakness/anemia, concern for possible GIB.   Dialysis Orders:  TTS - GOC 3:45hr, 400/700, EDW 53.5kg, 2K/2Ca, RUE AVG, no heparin  - Mircear 150mcg IV q 2 weeks (last given 75mcg on 4/10) - Calcitriol  2.35mcg PO q HD - Sensipar 30mg  Poq HD ---> Last PTH 1236 ---> Hgb dropping as outpatient: 10.9 (4/3), 9.4 (4/10), 8.1 (4/19)   # Presumed UGIB: Positive stool guaiacs, weakness. Hx GIB.  Received blood transfusion and for GI procedure today. Dose aranesp  today. HGB 7.4. Transfuse if HGB < 7.0   # ESRD: Continue usual TTS schedule - Next HD 05/29/2023    # Hypertension/volume: BP ok, no edema on exam.   # Metabolic bone disease: Monitor calcium  and phosphorus, resume home medication. #A-fib RVR: On admit, resolved  Teagen Mcleary H. Janyah Singleterry NP-C 05/28/2023, 11:05 AM  [employer] [phone]

## 2023-05-28 NOTE — Discharge Instructions (Signed)
 Sara Davis, [name] were hospitalized for weakness due to low blood pressure and anemia. At this time your blood pressure is improved, and your blood count is at a safe level. I feel comfortable discharging you from the hospital with PCP follow up. Thank you for allowing us  to be part of your care.   Please make an appointment with your PCP within 1-2 weeks.   Please note these changes made to your medications:   *Please START taking:  Pantoprazole  40mg  twice daily  Please make sure to return to the hospital if you are having worsening weakness, nausea, or bleeding.   Thanks,  Dr Esaw Heckler

## 2023-05-28 NOTE — Progress Notes (Signed)
  Echocardiogram 2D Echocardiogram has been performed.  Sara Davis 05/28/2023, 11:48 AM

## 2023-05-28 NOTE — TOC CM/SW Note (Signed)
 Patient confined to a room with no bathroom therefore needs bedside commode

## 2023-05-28 NOTE — Discharge Planning (Signed)
 [facility] Patient Discharge Orders- [facility] CLINIC: Sara Davis  Patient's name: Sara Davis Admit/DC Dates: 05/25/2023 - 05/28/2023  Discharge Diagnoses: Acute on chronic anemia Hx AVMs     Aranesp : Given: 05/27/2023  Date and amount of last dose: 60  mcg SQ  PRBC's Given: 1 unit Date/# of units: 05/26/2023 Last Hgb: 7.4 ESA dose for discharge: mircera 225 mcg IV q 2 weeks  IV Iron  dose at discharge: Continue weekly venofer. Dose per protocol  Heparin  change: No Heparin    EDW Change: Yes New EDW: 53 kg  Bath Change: No  Access intervention/Change: No Details:  Hectorol/Calcitriol  change: No  Discharge Labs: Calcium  8.1 Phosphorus 3.0 Albumin  3.0 K+ 4.2  IV Antibiotics: No Details:  On Coumadin?: No Last INR: Next INR: Managed By:   OTHER/APPTS/LAB ORDERS:    D/C Meds to be reconciled by nurse after every discharge.  Completed By: Jacobo Masters [facility] [phone]    Reviewed by: MD:______ RN_______

## 2023-05-28 NOTE — Evaluation (Signed)
 Physical Therapy Evaluation Patient Details Name: Sara Davis MRN: [id number] DOB: [birth year] Today's Date: 05/28/2023  History of Present Illness  Pt is a 73 y.o. female presented to [facility] on 4/22 for weakness and hypotension post dialysis. 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 PMH: CKD, DM, hyperlipidemia, hyperparathyroidism, HTN, AVMs.  Clinical Impression  Pt presents to PT with mild deficits in strength, power, endurance. Pt is upset this morning about not being able to eat, she is unaware of any pending procedures that would prevent her from eating. Pt is mobilizing well, ambulating for short household distances with SPC. Pt is encouraged to ambulate frequently to maintain her endurance and strength. PT to follow in the acute setting, pt declining any post-acute PT intervention.        If plan is discharge home, recommend the following: Assistance with cooking/housework;Assist for transportation   Can travel by private vehicle        Equipment Recommendations None recommended by PT  Recommendations for Other Services       Functional Status Assessment Patient has had a recent decline in their functional status and demonstrates the ability to make significant improvements in function in a reasonable and predictable amount of time.     Precautions / Restrictions Precautions Precautions: Fall Restrictions Weight Bearing Restrictions Per Provider Order: No      Mobility  Bed Mobility Overal bed mobility: Independent                  Transfers Overall transfer level: Modified independent Equipment used: Straight cane                    Ambulation/Gait Ambulation/Gait assistance: Supervision Gait Distance (Feet): 75 Feet Assistive device: Straight cane Gait Pattern/deviations: Step-through pattern Gait velocity: reduced Gait velocity interpretation: <1.8 ft/sec, indicate of risk for recurrent falls   General Gait Details: slowed  step-through gait, reduced stride length  Stairs            Wheelchair Mobility     Tilt Bed    Modified Rankin (Stroke Patients Only)       Balance Overall balance assessment: Needs assistance Sitting-balance support: No upper extremity supported, Feet supported Sitting balance-Leahy Scale: Good     Standing balance support: Single extremity supported, Reliant on assistive device for balance Standing balance-Leahy Scale: Poor                               Pertinent Vitals/Pain Pain Assessment Pain Assessment: No/denies pain    Home Living Family/patient expects to be discharged to:: Private residence Living Arrangements: Children Available Help at Discharge: Family;Available PRN/intermittently Type of Home: Apartment Home Access: Level entry       Home Layout: One level Home Equipment: Cane - single point      Prior Function Prior Level of Function : Independent/Modified Independent;Driving             Mobility Comments: ambulatory with [facility]       Extremity/Trunk Assessment   Upper Extremity Assessment Upper Extremity Assessment: Overall WFL for tasks assessed    Lower Extremity Assessment Lower Extremity Assessment: Generalized weakness (pt reports near baseline)    Cervical / Trunk Assessment Cervical / Trunk Assessment: Normal  Communication   Communication Communication: No apparent difficulties    Cognition Arousal: Alert Behavior During Therapy: WFL for tasks assessed/performed   PT - Cognitive impairments: No apparent impairments  Following commands: Intact       Cueing Cueing Techniques: Verbal cues     General Comments General comments (skin integrity, edema, etc.): VSS on RA    Exercises     Assessment/Plan    PT Assessment Patient needs continued PT services  PT Problem List Decreased strength;Decreased activity tolerance       PT Treatment Interventions Gait  training;Balance training;Neuromuscular re-education;Patient/family education    PT Goals (Current goals can be found in the Care Plan section)  Acute Rehab PT Goals Patient Stated Goal: to return home and eat PT Goal Formulation: With patient Time For Goal Achievement: 06/11/23 Potential to Achieve Goals: Good Additional Goals Additional Goal #1: Pt will score >19/24 on the DGI to indicate a reduced risk for falls Additional Goal #2: Pt will score >45/56 on the BERG to indicate a reduced risk for falls    Frequency Min 1X/week     Co-evaluation               AM-PAC PT "6 Clicks" Mobility  Outcome Measure Help needed turning from your back to your side while in a flat bed without using bedrails?: None Help needed moving from lying on your back to sitting on the side of a flat bed without using bedrails?: None Help needed moving to and from a bed to a chair (including a wheelchair)?: None Help needed standing up from a chair using your arms (e.g., wheelchair or bedside chair)?: None Help needed to walk in hospital room?: A Little Help needed climbing 3-5 steps with a railing? : A Little 6 Click Score: 22    End of Session Equipment Utilized During Treatment: Gait belt Activity Tolerance: Patient tolerated treatment well Patient left: in bed;with call bell/phone within reach Nurse Communication: Mobility status PT Visit Diagnosis: Other abnormalities of gait and mobility (R26.89);Muscle weakness (generalized) (M62.81)    Time: 7829-5621 PT Time Calculation (min) (ACUTE ONLY): 10 min   Charges:   PT Evaluation $PT Eval Low Complexity: 1 Low   PT General Charges $$ ACUTE PT VISIT: 1 Visit         Rexie Catena, PT, DPT Acute Rehabilitation Office [phone]   Rexie Catena 05/28/2023, 11:00 AM

## 2023-05-28 NOTE — Progress Notes (Signed)
 Reviewed AVS, patient expressed understanding of medications, MD follow up reviewed.   Removed IV, Site clean, dry and intact.  Patient states all belongings brought to the hospital at time of admission are accounted for and packed to take home.  Patient informed and expressed understanding to pick up medications from [facility] pharmacy before leaving hospital.  Patient requested to eat lunch before she is discharged. Informed charge nurse of patient request.

## 2023-05-28 NOTE — Progress Notes (Signed)
 Advised by attending staff that pt will d/c today. Contacted Harlen Lick to be advised of pt's d/c today and that pt should resume care tomorrow.   Lauraine Polite Renal Navigator [phone]

## 2023-05-28 NOTE — Discharge Summary (Signed)
 Name: Sara Davis MRN: [id number] DOB: [birth year] 73 y.o. PCP: Hershell Lose, NP  Date of Admission: 05/25/2023 12:01 PM Date of Discharge: 05/28/23 Attending Physician: Dr. Lelia Putnam  Discharge Diagnosis: Principal Problem:   Acute on chronic anemia Active Problems:   Atrial fib/flutter, transient [facility])   Gastrointestinal hemorrhage    Discharge Medications: Allergies as of 05/28/2023       Reactions   Aspirin  Other (See Comments)   Upset stomach, can only take coated aspirin  but prefers to not take it at all        Medication List     TAKE these medications    albuterol  108 (90 Base) MCG/ACT inhaler Commonly known as: VENTOLIN  HFA Inhale 1-2 puffs into the lungs every 6 (six) hours as needed for wheezing or shortness of breath.   amLODipine  5 MG tablet Commonly known as: NORVASC  Take 1 tablet by mouth daily.   atorvastatin  80 MG tablet Commonly known as: LIPITOR  Take 1 tablet (80 mg total) by mouth daily.   calcitRIOL  0.5 MCG capsule Commonly known as: ROCALTROL  Take 4 capsules (2 mcg total) by mouth 2 (two) times daily.   calcium  carbonate 1250 (500 Ca) MG tablet Commonly known as: OS-CAL - dosed in mg of elemental calcium  Take 4 tablets (5,000 mg total) by mouth 4 (four) times daily.   carvedilol  25 MG tablet Commonly known as: COREG  Take 1 tablet (25 mg total) by mouth 2 (two) times daily with a meal.   DIALYVITE TABLET Tabs Take 1 tablet by mouth daily.   pantoprazole  40 MG tablet Commonly known as: PROTONIX  Take 1 tablet (40 mg total) by mouth 2 (two) times daily.        Disposition and follow-up:   Ms.Tuleen DENORA WYSOCKI was discharged from [facility] in Stable condition.  At the hospital follow up visit please address:  1.  Follow-up:  Acute on chronic anemia: Will need CBC within 1 week after discharge. Pantoprazole  40mg  BID started at discharge. Assess for further signs of GI blood loss   HTN: Coreg  25mg  BID was  administered during admission, though it appears she has not been taking this at home. Can continue or discontinue if indicated   Afib with RVR: Patient was in Afib w/RVR on admission, likely due to hypotension during HD as well as acute anemia. Check BP and HR  2.  Labs / imaging needed at time of follow-up: CBC  3.  Pending labs/ test needing follow-up: TTE  4.  Medication Changes  Started: Pantoprazole  40mg  BID  Stopped:  Changed:  Abx -   End Date:  Follow-up Appointments:  Follow-up Information     Hershell Lose, NP. Call.   Specialty: Nurse Practitioner Why: Call ASAP to make a hospital follow up Contact information: [address] [place] [address] [phone]                Hospital Course by problem list:   Weakness  Hypotension Atrial fibrillation with RVR Patient presented to the ED from HD for weakness, nausea, and hypotension. In the ED she was hypotensive to 74/56 and in atrial fibrillation (which is new for her) with RVR to 170s. She converted to normal sinus rhythm with IV fluid bolus, and BP improved to the 110s systolic. Her symptoms were likely due to transient hypotension she often experiences during HD as well as poor po intake recently and acute on chronic anemia. Patient continued to symptomatically improve and BP remained stable, and  she remained in NSR for the duration of the admission.   Acute on chronic anemia Hx of small bowel AVMs Hgb on admission was 7.1. 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 IV PPI was started. She required 1 unit of pRBCs for a Hgb of 6.0. GI was consulted and endoscopy was performed, which showed three angioectasias with signs of recent bleeding which were treated with argon plasma coagulation. On the day after endoscopy her hemoglobin was stable, and patient was discharged. Pantoprazole  40mg  BID was started at discharge, and she will need a CBC  at PCP follow up within 1 week.   ESRD on HD Patient continued on her regular HD schedule throughout the admission.   R middle lobe opacity CXR showed a right middle lobe opacity suspicious for pneumonia. However patient was afebrile with no supplemental O2 requirement, and no leukocytosis. Antibiotics were not administered.   Elevated troponin Troponins initially elevated to 25 -> 53 -> 122. Patient was not having chest pain or shortness of breath, and EKG did not show ischemic changes. Elevated troponins likely elevated due to stress ischemia.   Hypokalemia: K 3.2 on admission, wnl after repletion  Elevated lactate: Lactate initially elevated, resolved with IV fluid repletion  Discharge Subjective: Patient was feeling well this morning. She is very eager to go home and would like to eat if possible. Per GI, she can be discharged if her hemoglobin is stable today, which it appears to be. No new issues.   Discharge Exam:   BP (!) 139/59   Pulse 64   Temp 97.8 F (36.6 C) (Oral)   Resp 16   Ht 5\' 6"  (1.676 m)   Wt 56.8 kg   SpO2 100%   BMI 20.21 kg/m  Constitutional: well-appearing, in no acute distress Neurological: alert & oriented x 3 Skin: warm and dry  Pertinent Labs, Studies, and Procedures:     Latest Ref Rng & Units 05/28/2023    4:03 AM 05/27/2023    6:41 AM 05/26/2023    8:38 AM  CBC  WBC 4.0 - 10.5 K/uL 6.2  6.1  5.6   Hemoglobin 12.0 - 15.0 g/dL 7.4  7.6  7.4   Hematocrit 36.0 - 46.0 % 23.1  23.7  23.0   Platelets 150 - 400 K/uL 239  234  238        Latest Ref Rng & Units 05/27/2023    6:41 AM 05/26/2023    1:31 AM 05/25/2023    1:05 PM  CMP  Glucose 70 - 99 mg/dL 811  914  782   BUN 8 - 23 mg/dL 28  15  11    Creatinine 0.44 - 1.00 mg/dL 9.56  2.13  0.86   Sodium 135 - 145 mmol/L 138  136  136   Potassium 3.5 - 5.1 mmol/L 4.2  3.8  3.1   Chloride 98 - 111 mmol/L 99  95  91   CO2 22 - 32 mmol/L 27  30    Calcium  8.9 - 10.3 mg/dL 8.1  7.7      DG Chest  Portable 1 View Result Date: 05/25/2023 CLINICAL DATA:  Hypotension EXAM: PORTABLE CHEST 1 VIEW COMPARISON:  October 17, 2022 FINDINGS: Ill-defined right middle lobe infiltrates with prominence of the interstitial markings bilaterally indicates some chronic interstitial lung disease and right middle lobe pneumonia Heart and mediastinum normal without congestive changes IMPRESSION: Right middle lobe pneumonia. Electronically  Signed   By: Fredrich Jefferson M.D.   On: 05/25/2023 14:10    Signed: Jayson Michael, MD PGY-1 05/28/2023, 11:03 AM   Pager: [phone]

## 2023-05-29 ENCOUNTER — Telehealth: Payer: Self-pay | Admitting: Nurse Practitioner

## 2023-05-29 LAB — HEPATITIS B SURFACE ANTIBODY, QUANTITATIVE: Hep B S AB Quant (Post): 31.6 m[IU]/mL

## 2023-05-29 NOTE — Telephone Encounter (Signed)
 Transition of Care - Initial Contact after Hospitalization  Date of discharge: 05/28/2023   Date of contact: 05/29/23  Method: Phone Spoke to: Patient  Patient contacted to discuss transition of care from recent inpatient hospitalization. Patient was admitted to [facility] from 05/25/2023 - 05/28/2023  with discharge diagnosis of Acute on chronic anemia   The discharge medication list was reviewed. Patient understands the changes and has no concerns.   Patient will return to his/her outpatient HD unit on: 05/29/2023  No other concerns at this time.

## 2023-05-30 ENCOUNTER — Encounter (HOSPITAL_COMMUNITY): Payer: Self-pay | Admitting: Gastroenterology

## 2023-06-01 ENCOUNTER — Emergency Department (HOSPITAL_COMMUNITY): Payer: Medicare (Managed Care)

## 2023-06-01 ENCOUNTER — Emergency Department (HOSPITAL_COMMUNITY)
Admission: EM | Admit: 2023-06-01 | Discharge: 2023-06-01 | Disposition: A | Discharge status: Home / Self Care | Payer: Medicare (Managed Care) | Attending: Emergency Medicine | Admitting: Emergency Medicine

## 2023-06-01 ENCOUNTER — Encounter (HOSPITAL_COMMUNITY): Payer: Self-pay | Admitting: Emergency Medicine

## 2023-06-01 ENCOUNTER — Other Ambulatory Visit: Payer: Self-pay

## 2023-06-01 DIAGNOSIS — R0602 Shortness of breath: Secondary | ICD-10-CM | POA: Diagnosis present

## 2023-06-01 DIAGNOSIS — F17200 Nicotine dependence, unspecified, uncomplicated: Secondary | ICD-10-CM | POA: Diagnosis not present

## 2023-06-01 DIAGNOSIS — Z992 Dependence on renal dialysis: Secondary | ICD-10-CM | POA: Diagnosis not present

## 2023-06-01 DIAGNOSIS — Z79899 Other long term (current) drug therapy: Secondary | ICD-10-CM | POA: Insufficient documentation

## 2023-06-01 DIAGNOSIS — I120 Hypertensive chronic kidney disease with stage 5 chronic kidney disease or end stage renal disease: Secondary | ICD-10-CM | POA: Insufficient documentation

## 2023-06-01 DIAGNOSIS — E1122 Type 2 diabetes mellitus with diabetic chronic kidney disease: Secondary | ICD-10-CM | POA: Insufficient documentation

## 2023-06-01 DIAGNOSIS — Z4931 Encounter for adequacy testing for hemodialysis: Secondary | ICD-10-CM | POA: Diagnosis not present

## 2023-06-01 DIAGNOSIS — N186 End stage renal disease: Secondary | ICD-10-CM | POA: Insufficient documentation

## 2023-06-01 DIAGNOSIS — R0600 Dyspnea, unspecified: Secondary | ICD-10-CM

## 2023-06-01 LAB — CBC WITH DIFFERENTIAL/PLATELET
Abs Immature Granulocytes: 0.02 10*3/uL (ref 0.00–0.07)
Basophils Absolute: 0 10*3/uL (ref 0.0–0.1)
Basophils Relative: 0 %
Eosinophils Absolute: 0.1 10*3/uL (ref 0.0–0.5)
Eosinophils Relative: 2 %
HCT: 27.3 % — ABNORMAL LOW (ref 36.0–46.0)
Hemoglobin: 8.4 g/dL — ABNORMAL LOW (ref 12.0–15.0)
Immature Granulocytes: 0 %
Lymphocytes Relative: 21 %
Lymphs Abs: 1.4 10*3/uL (ref 0.7–4.0)
MCH: 29.1 pg (ref 26.0–34.0)
MCHC: 30.8 g/dL (ref 30.0–36.0)
MCV: 94.5 fL (ref 80.0–100.0)
Monocytes Absolute: 0.5 10*3/uL (ref 0.1–1.0)
Monocytes Relative: 7 %
Neutro Abs: 4.6 10*3/uL (ref 1.7–7.7)
Neutrophils Relative %: 70 %
Platelets: 311 10*3/uL (ref 150–400)
RBC: 2.89 MIL/uL — ABNORMAL LOW (ref 3.87–5.11)
RDW: 14.4 % (ref 11.5–15.5)
WBC: 6.6 10*3/uL (ref 4.0–10.5)
nRBC: 0 % (ref 0.0–0.2)

## 2023-06-01 LAB — BRAIN NATRIURETIC PEPTIDE: B Natriuretic Peptide: 1550.5 pg/mL — ABNORMAL HIGH (ref 0.0–100.0)

## 2023-06-01 LAB — COMPREHENSIVE METABOLIC PANEL WITH GFR
ALT: 8 U/L (ref 0–44)
AST: 14 U/L — ABNORMAL LOW (ref 15–41)
Albumin: 3.4 g/dL — ABNORMAL LOW (ref 3.5–5.0)
Alkaline Phosphatase: 348 U/L — ABNORMAL HIGH (ref 38–126)
Anion gap: 15 (ref 5–15)
BUN: 27 mg/dL — ABNORMAL HIGH (ref 8–23)
CO2: 30 mmol/L (ref 22–32)
Calcium: 8.5 mg/dL — ABNORMAL LOW (ref 8.9–10.3)
Chloride: 97 mmol/L — ABNORMAL LOW (ref 98–111)
Creatinine, Ser: 7.06 mg/dL — ABNORMAL HIGH (ref 0.44–1.00)
GFR, Estimated: 6 mL/min — ABNORMAL LOW (ref >60)
Glucose, Bld: 199 mg/dL — ABNORMAL HIGH (ref 70–99)
Potassium: 3.6 mmol/L (ref 3.5–5.1)
Sodium: 142 mmol/L (ref 135–145)
Total Bilirubin: 0.6 mg/dL (ref 0.0–1.2)
Total Protein: 6.1 g/dL — ABNORMAL LOW (ref 6.5–8.1)

## 2023-06-01 LAB — MAGNESIUM: Magnesium: 2.7 mg/dL — ABNORMAL HIGH (ref 1.7–2.4)

## 2023-06-01 MED ORDER — CHLORHEXIDINE GLUCONATE CLOTH 2 % EX PADS
6.0000 | MEDICATED_PAD | Freq: Every day | CUTANEOUS | Status: DC
Start: 2023-06-01 — End: 2023-06-01

## 2023-06-01 NOTE — ED Notes (Signed)
Patient transported back to room from CT 

## 2023-06-01 NOTE — ED Provider Notes (Signed)
 Patient has completed dialysis session without issue.  Stable for discharge from the ED at this time.  She remains hemodynamically stable and feels well enough to go home   Sallyanne Creamer, DO 06/01/23 1749

## 2023-06-01 NOTE — Procedures (Signed)
 Asked to see this patient for hospital dialysis. Pt presented w/ SOB and negative CXR. Initial O2 was 88% on RA. On exam +bilat rales, not in distress.  The plan will be for "ED HD". Pt is not to be admitted at this time. Pt will go to the dialysis unit when they are ready for the patient. When dialysis is completed pt will be sent back to ED for reassessment.   Sara Poag  MD  [facility] 06/01/2023, 3:24 PM  Recent Labs  Lab 05/27/23 0641 05/28/23 0403 06/01/23 0410  HGB 7.6* 7.4* 8.4*  ALBUMIN  3.0*  --  3.4*  CALCIUM  8.1*  --  8.5*  PHOS 3.0  --   --   CREATININE 5.33*  --  7.06*  K 4.2  --  3.6    Inpatient medications:  Chlorhexidine  Gluconate Cloth  6 each Topical Q0600

## 2023-06-01 NOTE — ED Provider Notes (Signed)
 [place] EMERGENCY DEPARTMENT AT [facility] Provider Note   CSN: [id number] Arrival date & time: 06/01/23  0401     History  Chief Complaint  Patient presents with   Shortness of Breath    Sara Davis is a 73 y.o. female.  Patient with past medical history significant for ESRD on Tuesday Thursday Saturday dialysis, hypertension, diabetes presents to the emergency department complaining of shortness of breath that woke her from sleep this morning.  EMS was called and noted that the patient was hypoxic with room air saturations in the 85 to 88% range.  EMS noted some mild wheezing and gave her a nebulizer treatment during transport.  Patient is no longer wheezing upon arrival, able to maintain oxygen saturations in the upper 90s at rest.  She does still complain of some mild shortness of breath.  She states that she was able to attend her dialysis session on Saturday and had a complete session.  The patient was admitted to the hospital last week due to atrial fibs/flutter, acute on chronic anemia   Shortness of Breath      Home Medications Prior to Admission medications   Medication Sig Start Date End Date Taking? Authorizing Provider  albuterol  (VENTOLIN  HFA) 108 (90 Base) MCG/ACT inhaler Inhale 1-2 puffs into the lungs every 6 (six) hours as needed for wheezing or shortness of breath. Patient not taking: Reported on 02/09/2023 10/27/22   [provider]  amLODipine  (NORVASC ) 5 MG tablet Take 1 tablet by mouth daily. 05/06/23   [provider]  atorvastatin  (LIPITOR ) 80 MG tablet Take 1 tablet (80 mg total) by mouth daily. 12/20/18   Emile Harari, MD  B Complex-C-Folic Acid  (DIALYVITE TABLET) TABS Take 1 tablet by mouth daily. 03/05/22   [provider]  calcitRIOL  (ROCALTROL ) 0.5 MCG capsule Take 4 capsules (2 mcg total) by mouth 2 (two) times daily. 02/11/23   Stovall, Kathryn R, PA-C  calcium  carbonate (OS-CAL - DOSED IN MG OF ELEMENTAL CALCIUM )  1250 (500 Ca) MG tablet Take 4 tablets (5,000 mg total) by mouth 4 (four) times daily. 02/12/23   Stovall, Kathryn R, PA-C  carvedilol  (COREG ) 25 MG tablet Take 1 tablet (25 mg total) by mouth 2 (two) times daily with a meal. 05/28/23   Jayson Michael, MD  pantoprazole  (PROTONIX ) 40 MG tablet Take 1 tablet (40 mg total) by mouth 2 (two) times daily. 05/28/23   Jayson Michael, MD      Allergies    Aspirin     Review of Systems   Review of Systems  Respiratory:  Positive for shortness of breath.     Physical Exam Updated Vital Signs BP (!) 124/48 (BP Location: Left Arm)   Pulse 66   Temp 98.2 F (36.8 C) (Oral)   Resp 18   Ht 5\' 6"  (1.676 m)   Wt 56.8 kg   SpO2 100%   BMI 20.21 kg/m  Physical Exam Vitals and nursing note reviewed.  Constitutional:      General: She is not in acute distress.    Appearance: She is well-developed.  HENT:     Head: Normocephalic and atraumatic.  Eyes:     Conjunctiva/sclera: Conjunctivae normal.  Cardiovascular:     Rate and Rhythm: Normal rate and regular rhythm.  Pulmonary:     Effort: Pulmonary effort is normal. No respiratory distress.     Breath sounds: Examination of the right-lower field reveals rales. Examination of the left-lower field reveals rales. Rales  present.  Chest:     Chest wall: No tenderness.  Abdominal:     Palpations: Abdomen is soft.     Tenderness: There is no abdominal tenderness.  Musculoskeletal:        General: No swelling.     Cervical back: Neck supple.  Skin:    General: Skin is warm and dry.     Capillary Refill: Capillary refill takes less than 2 seconds.  Neurological:     Mental Status: She is alert.  Psychiatric:        Mood and Affect: Mood normal.     ED Results / Procedures / Treatments   Labs (all labs ordered are listed, but only abnormal results are displayed) Labs Reviewed  CBC WITH DIFFERENTIAL/PLATELET - Abnormal; Notable for the following components:      Result Value   RBC 2.89 (*)     Hemoglobin 8.4 (*)    HCT 27.3 (*)    All other components within normal limits  COMPREHENSIVE METABOLIC PANEL WITH GFR - Abnormal; Notable for the following components:   Chloride 97 (*)    Glucose, Bld 199 (*)    BUN 27 (*)    Creatinine, Ser 7.06 (*)    Calcium  8.5 (*)    Total Protein 6.1 (*)    Albumin  3.4 (*)    AST 14 (*)    Alkaline Phosphatase 348 (*)    GFR, Estimated 6 (*)    All other components within normal limits  MAGNESIUM - Abnormal; Notable for the following components:   Magnesium 2.7 (*)    All other components within normal limits  BRAIN NATRIURETIC PEPTIDE    EKG EKG Interpretation Date/Time:  Tuesday June 01 2023 04:13:46 EDT Ventricular Rate:  72 PR Interval:  141 QRS Duration:  102 QT Interval:  444 QTC Calculation: 486 R Axis:   -34  Text Interpretation: Incomplete analysis due to missing data in precordial lead(s) Sinus rhythm Abnormal R-wave progression, early transition LVH with secondary repolarization abnormality Borderline prolonged QT interval Missing lead(s): V6 When compared with ECG of 05/25/2023, No significant change was found Confirmed by Alissa April ([id number]) on 06/01/2023 4:24:44 AM  Radiology DG Chest 2 View Result Date: 06/01/2023 CLINICAL DATA:  Dyspnea and shortness of breath. PA and lateral chest series was recommended based on possible hazy infiltrate in the left lower lung field on a portable chest today. EXAM: CHEST - 2 VIEW COMPARISON:  Portable chest today at 4:22 a.m. FINDINGS: PA Lat at 5:09 a.m. Lung volumes are slightly improved but not much better. Suggestion of hazy infiltrate in the left lower lung field is not seen on the current exam. There are linear atelectatic foci in both hypoinflated lung bases but no focal pneumonia is seen. There is a trace left pleural effusion. The lungs are otherwise clear. Mediastinum is stable with mild aortic atherosclerosis and uncoiling. The cardiac size slightly enlarged with normal caliber  vascular markings. Osteopenia and thoracic spondylosis. IMPRESSION: 1. Hypoinflated lungs with linear atelectatic foci in both lung bases. No focal pneumonia is seen. 2. Trace left pleural effusion. 3. Mild cardiomegaly. 4. Aortic atherosclerosis and tortuosity. Electronically Signed   By: Denman Fischer M.D.   On: 06/01/2023 05:25   DG Chest Port 1 View Result Date: 06/01/2023 CLINICAL DATA:  Shortness of breath and hypoxia. EXAM: PORTABLE CHEST 1 VIEW COMPARISON:  Portable chest 05/25/2023 FINDINGS: The heart is slightly enlarged.  No vascular congestion is seen. The mediastinum is normally outlined.  The sulci are sharp. There is new patchy hazy opacity in the left lower lung field which could be asymmetric atelectasis or pneumonia. PA and lateral views in full inspiration are recommended to see if this persists. The remaining hypoexpanded lungs are clear. No new osseous findings. IMPRESSION: 1. New patchy hazy opacity in the left lower lung field which could be asymmetric atelectasis or pneumonia, in the setting of low lung volumes. PA and lateral views in full inspiration are recommended to see if this persists. 2. Mild cardiomegaly. Electronically Signed   By: Denman Fischer M.D.   On: 06/01/2023 04:41    Procedures Procedures    Medications Ordered in ED Medications - No data to display  ED Course/ Medical Decision Making/ A&P                                 Medical Decision Making Amount and/or Complexity of Data Reviewed Labs: ordered. Radiology: ordered.   This patient presents to the ED for concern of shortness of breath, this involves an extensive number of treatment options, and is a complaint that carries with it a high risk of complications and morbidity.  The differential diagnosis includes fluid overload, asthma/COPD, pneumonia, others   Co morbidities that complicate the patient evaluation  ESRD   Additional history obtained:  Additional history obtained from  EMS External records from outside source obtained and reviewed including recent discharge summary   Lab Tests:  I Ordered, and personally interpreted labs.  The pertinent results include: Potassium 3.6, hemoglobin 8.4 improved from previous   Imaging Studies ordered:  I ordered imaging studies including portable and 2 view chest x-rays I independently visualized and interpreted imaging which showed  New patchy hazy opacity in the left lower lung field which could  be asymmetric atelectasis or pneumonia, in the setting of low lung  volumes. PA and lateral views in full inspiration are recommended to  see if this persists.   1. Hypoinflated lungs with linear atelectatic foci in both lung  bases. No focal pneumonia is seen.  2. Trace left pleural effusion.  3. Mild cardiomegaly.  4. Aortic atherosclerosis and tortuosity.   I agree with the radiologist interpretation   Cardiac Monitoring: / EKG:  The patient was maintained on a cardiac monitor.  I personally viewed and interpreted the cardiac monitored which showed an underlying rhythm of: Sinus rhythm   Consultations Obtained:  I requested consultation with the nephrologist, Dr. Jearldine Mina,  and discussed lab and imaging findings as well as pertinent plan - they recommend: Hemodialysis   Social Determinants of Health:  Patient is a daily tobacco smoker   Test / Admission - Considered:  Patient clinically appears to be fluid overloaded.  Would benefit from hemodialysis.  Since she will be unable to get hemodialysis as an outpatient I have contacted our nephrologist who is adding her to today's hemodialysis list.  Plan to have patient come back to ED for reassessment prior to discharge.         Final Clinical Impression(s) / ED Diagnoses Final diagnoses:  Acute hemodialysis encounter [facility])  Dyspnea, unspecified type    Rx / DC Orders ED Discharge Orders     None         Delories Fetter 06/01/23  0548    Alissa April, MD 06/01/23 [phone]

## 2023-06-01 NOTE — ED Notes (Signed)
 Patient transported to CT

## 2023-06-01 NOTE — ED Triage Notes (Signed)
 Pt BIB EMS from home with c/o shob. Initial O2 was 88% on RA, FD placed her 4lpm [place]. Last dialysis was Saturday.  Duoneb x 2

## 2023-06-01 NOTE — Procedures (Deleted)
 Asked to see this patient for hospital dialysis. Pt presented w/ SOB and negative CXR. Initial O2 was 88% on RA. On exam +bilat rales, not in distress.  The plan will be for "ED HD". Pt is not to be admitted at this time. Pt will go to the dialysis unit when they are ready for the patient. When dialysis is completed pt will be sent back to ED for reassessment.   [name]  MD  [facility] 06/01/2023, 3:24 PM  Recent Labs  Lab 05/27/23 0641 05/28/23 0403 06/01/23 0410  HGB 7.6* 7.4* 8.4*  ALBUMIN  3.0*  --  3.4*  CALCIUM  8.1*  --  8.5*  PHOS 3.0  --   --   CREATININE 5.33*  --  7.06*  K 4.2  --  3.6    Inpatient medications:  Chlorhexidine  Gluconate Cloth  6 each Topical Q0600

## 2023-06-01 NOTE — Progress Notes (Signed)
   06/01/23 1647  Vitals  Temp 97.6 F (36.4 C)  Pulse Rate 61  Resp 12  BP (!) 106/57  SpO2 100 %  O2 Device Room Air  Post Treatment  Dialyzer Clearance Clear  Hemodialysis Intake (mL) 200 mL  Liters Processed 78  Fluid Removed (mL) 1800 mL  Tolerated HD Treatment Yes  AVG/AVF Arterial Site Held (minutes) 10 minutes  AVG/AVF Venous Site Held (minutes) 10 minutes   Received patient in bed to unit.  Alert and oriented.  Informed consent signed and in chart.   TX duration:3.25hrs  Patient tolerated well.  Transported back to the room  Alert, without acute distress.  Hand-off given to patient's nurse.   Access used: RAVG Access issues: none  Total UF removed: 1.8L Medication(s) given: none    [facility]

## 2023-06-01 NOTE — Discharge Instructions (Addendum)
 You were seen in the emergency department and completed your dialysis session Please follow-up with your kidney team for your next dialysis session as scheduled Return to the emergency department for shortness of breath or any other concerns

## 2023-06-01 NOTE — ED Notes (Signed)
 Pt transported to dialysis

## 2023-06-07 DIAGNOSIS — K31811 Angiodysplasia of stomach and duodenum with bleeding: Secondary | ICD-10-CM

## 2023-07-02 ENCOUNTER — Other Ambulatory Visit: Payer: Medicare (Managed Care)

## 2023-07-02 ENCOUNTER — Ambulatory Visit: Payer: Medicare (Managed Care) | Admitting: Physician Assistant

## 2023-07-02 ENCOUNTER — Encounter: Payer: Self-pay | Admitting: Physician Assistant

## 2023-07-02 VITALS — BP 116/52 | HR 69 | Ht 66.0 in | Wt 119.2 lb

## 2023-07-02 DIAGNOSIS — K31811 Angiodysplasia of stomach and duodenum with bleeding: Secondary | ICD-10-CM

## 2023-07-02 DIAGNOSIS — K31819 Angiodysplasia of stomach and duodenum without bleeding: Secondary | ICD-10-CM | POA: Diagnosis not present

## 2023-07-02 DIAGNOSIS — D509 Iron deficiency anemia, unspecified: Secondary | ICD-10-CM | POA: Diagnosis not present

## 2023-07-02 LAB — IBC + FERRITIN
Ferritin: 536.3 ng/mL — ABNORMAL HIGH (ref 10.0–291.0)
Iron: 74 ug/dL (ref 42–145)
Saturation Ratios: 27.7 % (ref 20.0–50.0)
TIBC: 267.4 ug/dL (ref 250.0–450.0)
Transferrin: 191 mg/dL — ABNORMAL LOW (ref 212.0–360.0)

## 2023-07-02 LAB — CBC WITH DIFFERENTIAL/PLATELET
Basophils Absolute: 0 10*3/uL (ref 0.0–0.1)
Basophils Relative: 0.5 % (ref 0.0–3.0)
Eosinophils Absolute: 0.1 10*3/uL (ref 0.0–0.7)
Eosinophils Relative: 1.3 % (ref 0.0–5.0)
HCT: 31.5 % — ABNORMAL LOW (ref 36.0–46.0)
Hemoglobin: 10.2 g/dL — ABNORMAL LOW (ref 12.0–15.0)
Lymphocytes Relative: 28.1 % (ref 12.0–46.0)
Lymphs Abs: 1.3 10*3/uL (ref 0.7–4.0)
MCHC: 32.2 g/dL (ref 30.0–36.0)
MCV: 90.4 fl (ref 78.0–100.0)
Monocytes Absolute: 0.3 10*3/uL (ref 0.1–1.0)
Monocytes Relative: 6.3 % (ref 3.0–12.0)
Neutro Abs: 3 10*3/uL (ref 1.4–7.7)
Neutrophils Relative %: 63.8 % (ref 43.0–77.0)
Platelets: 255 10*3/uL (ref 150.0–400.0)
RBC: 3.49 Mil/uL — ABNORMAL LOW (ref 3.87–5.11)
RDW: 16.1 % — ABNORMAL HIGH (ref 11.5–15.5)
WBC: 4.7 10*3/uL (ref 4.0–10.5)

## 2023-07-02 NOTE — Progress Notes (Signed)
 Chief Complaint: Iron  deficiency anemia  HPI:    Sara Davis is a 73 year old African-American female with past medical history as listed below including ESRD on HD, depression, diabetes and multiple others, known to Dr. Elvin Hammer, who returns to clinic today for follow-up after hospitalization and consultation by our service for anemia.    05/26/23 patient consulted by our service and at that time and had a large volume of melena with worsening anemia with known AVMs of the small bowel.  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    06/01/2023 CBC with a hemoglobin of 8.4 (7.4 on 07/28/2023).    Today, patient presents to clinic accompanied by her son who assists with history.  She is doing well, feels well, no further melena.  She has not had any labs done over the past month since leaving the hospital.  She remains on Pantoprazole  40 mg twice daily.    Denies fever, chills, weight loss or change in bowel habits.  GI history: Patient does have prior history of GI bleeding secondary to previously documented AVMs.  She had had a colonoscopy in August 2020 for [facility] which was negative. She had GI workup here in September 2024 with enteroscopy with finding of 3 small AVMs in the duodenum which were treated with APC and a small jejunal polyp which was removed. Capsule endoscopy during that same admission showed 1 very proximal AVM just beyond the first duodenal image, and then 2 tiny AVMs at 3 hours and 25 minutes in 3 hours of 27 minutes which were both at least 90 minutes beyond the last duodenal image and very likely out of reach of enteroscopy  Past Medical History:  Diagnosis Date   Anemia    Cellulitis of scalp 02/2015   Chronic kidney disease 01/20/2021   stage V   Depression    Diabetes mellitus without complication (HCC)    History of blood transfusion 11/2022    Hyperlipidemia    Hyperparathyroidism (HCC)    Hypertension     Past Surgical History:  Procedure Laterality Date   ABDOMINAL HYSTERECTOMY     AV FISTULA PLACEMENT Right 02/05/2021   Procedure: INSERTION OF RIGHT ARM ARTERIOVENOUS (AV) GORE-TEX GRAFT;  Surgeon: Carlene Che, MD;  Location: [facility] OR;  Service: Vascular;  Laterality: Right;   BIOPSY  01/07/2018   Procedure: BIOPSY;  Surgeon: Tobin Forts, MD;  Location: [facility] ENDOSCOPY;  Service: Endoscopy;;  Clotest   COLONOSCOPY WITH PROPOFOL  N/A 01/07/2018   Procedure: COLONOSCOPY WITH PROPOFOL ;  Surgeon: Tobin Forts, MD;  Location: [facility] ENDOSCOPY;  Service: Endoscopy;  Laterality: N/A;   ENTEROSCOPY N/A 06/17/2022   Procedure: ENTEROSCOPY;  Surgeon: Albertina Hugger, MD;  Location: [facility] ENDOSCOPY;  Service: Gastroenterology;  Laterality: N/A;   ENTEROSCOPY N/A 10/20/2022   Procedure: ENTEROSCOPY;  Surgeon: Elois Hair, MD;  Location: [facility] ENDOSCOPY;  Service: Gastroenterology;  Laterality: N/A;   ESOPHAGOGASTRODUODENOSCOPY N/A 05/27/2023   Procedure: EGD (ESOPHAGOGASTRODUODENOSCOPY);  Surgeon: Nandigam, Kavitha V, MD;  Location: [facility] ENDOSCOPY;  Service: Gastroenterology;  Laterality: N/A;   ESOPHAGOGASTRODUODENOSCOPY (EGD) WITH PROPOFOL  N/A 01/07/2018   Procedure: ESOPHAGOGASTRODUODENOSCOPY (EGD) WITH PROPOFOL ;  Surgeon: Tobin Forts, MD;  Location: [facility] ENDOSCOPY;  Service: Endoscopy;  Laterality: N/A;   ESOPHAGOGASTRODUODENOSCOPY (EGD) WITH PROPOFOL  N/A 12/14/2018   Procedure: ESOPHAGOGASTRODUODENOSCOPY (EGD) WITH PROPOFOL ;  Surgeon: Tobin Forts, MD;  Location: [facility] ENDOSCOPY;  Service: Endoscopy;  Laterality: N/A;   GIVENS CAPSULE STUDY N/A 10/18/2022   Procedure: GIVENS CAPSULE STUDY;  Surgeon: Sergio Dandy, MD;  Location: [facility] ENDOSCOPY;  Service: Gastroenterology;  Laterality: N/A;  Per Mansouraty   HEMOSTASIS CLIP PLACEMENT  06/17/2022   Procedure: HEMOSTASIS CLIP PLACEMENT;  Surgeon: Albertina Hugger, MD;  Location: [facility] ENDOSCOPY;   Service: Gastroenterology;;   HEMOSTASIS CONTROL  12/14/2018   Procedure: HEMOSTASIS CONTROL;  Surgeon: Tobin Forts, MD;  Location: [facility] ENDOSCOPY;  Service: Endoscopy;;   HOT HEMOSTASIS N/A 06/17/2022   Procedure: HOT HEMOSTASIS (ARGON PLASMA COAGULATION/BICAP);  Surgeon: Albertina Hugger, MD;  Location: [facility] ENDOSCOPY;  Service: Gastroenterology;  Laterality: N/A;   HOT HEMOSTASIS N/A 10/20/2022   Procedure: HOT HEMOSTASIS (ARGON PLASMA COAGULATION/BICAP);  Surgeon: Elois Hair, MD;  Location: [facility] ENDOSCOPY;  Service: Gastroenterology;  Laterality: N/A;   HOT HEMOSTASIS N/A 05/27/2023   Procedure: EGD, WITH ARGON PLASMA COAGULATION;  Surgeon: Sergio Dandy, MD;  Location: [facility] ENDOSCOPY;  Service: Gastroenterology;  Laterality: N/A;   PARATHYROIDECTOMY Left 02/09/2023   Procedure: LEFT INFERIOR PARATHYROIDECTOMY;  Surgeon: Oralee Billow, MD;  Location: [facility] OR;  Service: General;  Laterality: Left;   POLYPECTOMY  01/07/2018   Procedure: POLYPECTOMY;  Surgeon: Tobin Forts, MD;  Location: [facility] ENDOSCOPY;  Service: Endoscopy;;   POLYPECTOMY  10/20/2022   Procedure: POLYPECTOMY;  Surgeon: Elois Hair, MD;  Location: [facility] ENDOSCOPY;  Service: Gastroenterology;;   Daryle Eon  06/17/2022   Procedure: Daryle Eon;  Surgeon: Albertina Hugger, MD;  Location: [facility] ENDOSCOPY;  Service: Gastroenterology;;    Current Outpatient Medications  Medication Sig Dispense Refill   albuterol  (VENTOLIN  HFA) 108 (90 Base) MCG/ACT inhaler Inhale 1-2 puffs into the lungs every 6 (six) hours as needed for wheezing or shortness of breath. (Patient not taking: Reported on 02/09/2023)     amLODipine  (NORVASC ) 5 MG tablet Take 1 tablet by mouth daily.     atorvastatin  (LIPITOR ) 80 MG tablet Take 1 tablet (80 mg total) by mouth daily. 90 tablet 1   B Complex-C-Folic Acid  (DIALYVITE TABLET) TABS Take 1 tablet by mouth daily.     calcitRIOL  (ROCALTROL ) 0.5 MCG capsule Take 4 capsules (2 mcg total) by mouth 2 (two)  times daily. 240 capsule 6   calcium  carbonate (OS-CAL - DOSED IN MG OF ELEMENTAL CALCIUM ) 1250 (500 Ca) MG tablet Take 4 tablets (5,000 mg total) by mouth 4 (four) times daily. 480 tablet 6   carvedilol  (COREG ) 25 MG tablet Take 1 tablet (25 mg total) by mouth 2 (two) times daily with a meal. 180 tablet 3   pantoprazole  (PROTONIX ) 40 MG tablet Take 1 tablet (40 mg total) by mouth 2 (two) times daily. 60 tablet 0   No current facility-administered medications for this visit.    Allergies as of 07/02/2023 - Review Complete 06/01/2023  Allergen Reaction Noted   Aspirin  Other (See Comments) 01/14/2012    Family History  Problem Relation Age of Onset   Diabetes Mother    Cancer Mother    Diabetes Sister    Hypertension Sister    Cancer Sister 74       Bone   Diabetes Brother    Diabetes Sister    Diabetes Sister     Social History   Socioeconomic History   Marital status: Single    Spouse name: Not on file   Number of children: 2   Years of education: Not on file   Highest  education level: Not on file  Occupational History   Occupation: Retired  Tobacco Use   Smoking status: Every Day    Current packs/day: 0.50    Types: Cigarettes   Smokeless tobacco: Never  Vaping Use   Vaping status: Never Used  Substance and Sexual Activity   Alcohol use: No    Alcohol/week: 0.0 standard drinks of alcohol   Drug use: No   Sexual activity: Not on file  Other Topics Concern   Not on file  Social History Narrative   Not on file   Social Drivers of Health   Financial Resource Strain: Not on file  Food Insecurity: No Food Insecurity (05/25/2023)   Hunger Vital Sign    Worried About Running Out of Food in the Last Year: Never true    Ran Out of Food in the Last Year: Never true  Transportation Needs: No Transportation Needs (05/25/2023)   PRAPARE - Administrator, Civil Service (Medical): No    Lack of Transportation (Non-Medical): No  Physical Activity: Not on file   Stress: Not on file  Social Connections: Socially Isolated (05/26/2023)   Social Connection and Isolation Panel [NHANES]    Frequency of Communication with Friends and Family: Never    Frequency of Social Gatherings with Friends and Family: Never    Attends Religious Services: Never    Database administrator or Organizations: No    Attends Banker Meetings: Never    Marital Status: Divorced  Catering manager Violence: Not At Risk (05/25/2023)   Humiliation, Afraid, Rape, and Kick questionnaire    Fear of Current or Ex-Partner: No    Emotionally Abused: No    Physically Abused: No    Sexually Abused: No    Review of Systems:    Constitutional: No weight loss, fever or chills Cardiovascular: No chest pain Respiratory: No SOB  Gastrointestinal: See HPI and otherwise negative   Physical Exam:  Vital signs: BP (!) 116/52 (BP Location: Left Arm, Patient Position: Sitting, Cuff Size: Normal)   Pulse 69   Ht 5\' 6"  (1.676 m)   Wt 119 lb 4 oz (54.1 kg)   BMI 19.25 kg/m    Constitutional:   Pleasant AA female appears to be in NAD, Well developed, Well nourished, alert and cooperative Respiratory: Respirations even and unlabored. Lungs clear to auscultation bilaterally.   No wheezes, crackles, or rhonchi.  Cardiovascular: Normal S1, S2. No MRG. Regular rate and rhythm. No peripheral edema, cyanosis or pallor.  Gastrointestinal:  Soft, nondistended, nontender. No rebound or guarding. Normal bowel sounds. No appreciable masses or hepatomegaly. Rectal:  Not performed.  Psychiatric: Demonstrates good judgement and reason without abnormal affect or behaviors.  RELEVANT LABS AND IMAGING: CBC    Component Value Date/Time   WBC 6.6 06/01/2023 0410   RBC 2.89 (L) 06/01/2023 0410   HGB 8.4 (L) 06/01/2023 0410   HGB 12.0 04/19/2023 1024   HGB 10.5 (L) 05/09/2019 1139   HCT 27.3 (L) 06/01/2023 0410   HCT 31.8 (L) 05/09/2019 1139   PLT 311 06/01/2023 0410   PLT 300 04/19/2023  1024   PLT 347 05/09/2019 1139   MCV 94.5 06/01/2023 0410   MCV 85 05/09/2019 1139   MCH 29.1 06/01/2023 0410   MCHC 30.8 06/01/2023 0410   RDW 14.4 06/01/2023 0410   RDW 12.6 05/09/2019 1139   LYMPHSABS 1.4 06/01/2023 0410   LYMPHSABS 1.7 05/09/2019 1139   MONOABS 0.5 06/01/2023 0410   EOSABS  0.1 06/01/2023 0410   EOSABS 0.1 05/09/2019 1139   BASOSABS 0.0 06/01/2023 0410   BASOSABS 0.0 05/09/2019 1139    CMP     Component Value Date/Time   NA 142 06/01/2023 0410   NA 142 05/09/2019 1139   K 3.6 06/01/2023 0410   CL 97 (L) 06/01/2023 0410   CO2 30 06/01/2023 0410   GLUCOSE 199 (H) 06/01/2023 0410   BUN 27 (H) 06/01/2023 0410   BUN 23 05/09/2019 1139   CREATININE 7.06 (H) 06/01/2023 0410   CREATININE 8.77 (HH) 04/19/2023 1024   CREATININE 0.75 07/31/2013 1059   CALCIUM  8.5 (L) 06/01/2023 0410   PROT 6.1 (L) 06/01/2023 0410   PROT 6.3 05/09/2019 1139   ALBUMIN  3.4 (L) 06/01/2023 0410   ALBUMIN  4.5 05/09/2019 1139   AST 14 (L) 06/01/2023 0410   AST 12 (L) 04/19/2023 1024   ALT 8 06/01/2023 0410   ALT 6 04/19/2023 1024   ALKPHOS 348 (H) 06/01/2023 0410   BILITOT 0.6 06/01/2023 0410   BILITOT 0.5 04/19/2023 1024   GFRNONAA 6 (L) 06/01/2023 0410   GFRNONAA 4 (L) 04/19/2023 1024   GFRAA 18 (L) 05/09/2019 1139   GFRAA 15 (L) 12/23/2018 1110    Assessment: 1.  Iron  deficiency anemia: With known small bowel AVMs, last treated in April, no recheck of hemoglobin since then  Plan: 1.  Recheck CBC and iron  studies with ferritin today 2.  Continue Pantoprazole  40 twice daily 3.  Patient to follow in clinic with us  as needed.  Reginal Capra, PA-C [facility] 07/02/2023, 1:46 PM  Cc: Nguyen, Kim, NP

## 2023-07-02 NOTE — Patient Instructions (Addendum)
 _______________________________________________________  If your blood pressure at your visit was 140/90 or greater, please contact your primary care physician to follow up on this.  _______________________________________________________  If you are age 72 or older, your body mass index should be between 23-30. Your Body mass index is 19.25 kg/m. If this is out of the aforementioned range listed, please consider follow up with your Primary Care Provider.  If you are age 22 or younger, your body mass index should be between 19-25. Your Body mass index is 19.25 kg/m. If this is out of the aformentioned range listed, please consider follow up with your Primary Care Provider.   ________________________________________________________  The [facility] providers would like to encourage you to use MYCHART to communicate with providers for non-urgent requests or questions.  Due to long hold times on the telephone, sending your provider a message by [facility] may be a faster and more efficient way to get a response.  Please allow 48 business hours for a response.  Please remember that this is for non-urgent requests.  _______________________________________________________  Your provider has requested that you go to the basement level for lab work before leaving today. Press "B" on the elevator. The lab is located at the first door on the left as you exit the elevator.  It was a pleasure to see you today!  Thank you for trusting me with your gastrointestinal care!

## 2023-07-05 ENCOUNTER — Ambulatory Visit: Payer: Self-pay | Admitting: Physician Assistant

## 2023-07-05 NOTE — Progress Notes (Signed)
 Noted. Multifactorial anemia, namely renal disease.  No iron  deficiency. Resume care with primary providers.  GI follow-up as needed

## 2023-07-18 ENCOUNTER — Other Ambulatory Visit: Payer: Self-pay | Admitting: Hematology and Oncology

## 2023-07-18 DIAGNOSIS — R768 Other specified abnormal immunological findings in serum: Secondary | ICD-10-CM

## 2023-07-18 DIAGNOSIS — D472 Monoclonal gammopathy: Secondary | ICD-10-CM

## 2023-07-19 ENCOUNTER — Inpatient Hospital Stay: Payer: Medicare (Managed Care) | Attending: Hematology and Oncology

## 2023-07-19 DIAGNOSIS — D472 Monoclonal gammopathy: Secondary | ICD-10-CM | POA: Insufficient documentation

## 2023-07-19 DIAGNOSIS — D649 Anemia, unspecified: Secondary | ICD-10-CM | POA: Diagnosis not present

## 2023-07-19 DIAGNOSIS — R768 Other specified abnormal immunological findings in serum: Secondary | ICD-10-CM

## 2023-07-19 DIAGNOSIS — N289 Disorder of kidney and ureter, unspecified: Secondary | ICD-10-CM | POA: Diagnosis not present

## 2023-07-19 LAB — CMP (CANCER CENTER ONLY)
ALT: 6 U/L (ref 0–44)
AST: 12 U/L — ABNORMAL LOW (ref 15–41)
Albumin: 4.8 g/dL (ref 3.5–5.0)
Alkaline Phosphatase: 283 U/L — ABNORMAL HIGH (ref 38–126)
Anion gap: 15 (ref 5–15)
BUN: 62 mg/dL — ABNORMAL HIGH (ref 8–23)
CO2: 29 mmol/L (ref 22–32)
Calcium: 8.8 mg/dL — ABNORMAL LOW (ref 8.9–10.3)
Chloride: 99 mmol/L (ref 98–111)
Creatinine: 6.27 mg/dL — ABNORMAL HIGH (ref 0.44–1.00)
GFR, Estimated: 7 mL/min — ABNORMAL LOW (ref >60)
Glucose, Bld: 168 mg/dL — ABNORMAL HIGH (ref 70–99)
Potassium: 4.6 mmol/L (ref 3.5–5.1)
Sodium: 143 mmol/L (ref 135–145)
Total Bilirubin: 0.4 mg/dL (ref 0.0–1.2)
Total Protein: 7.4 g/dL (ref 6.5–8.1)

## 2023-07-19 LAB — RETIC PANEL
Immature Retic Fract: 34.7 % — ABNORMAL HIGH (ref 2.3–15.9)
RBC.: 4.35 MIL/uL (ref 3.87–5.11)
Retic Count, Absolute: 158.3 10*3/uL (ref 19.0–186.0)
Retic Ct Pct: 3.6 % — ABNORMAL HIGH (ref 0.4–3.1)
Reticulocyte Hemoglobin: 29.1 pg (ref >27.9)

## 2023-07-19 LAB — CBC WITH DIFFERENTIAL (CANCER CENTER ONLY)
Abs Immature Granulocytes: 0.03 10*3/uL (ref 0.00–0.07)
Basophils Absolute: 0 10*3/uL (ref 0.0–0.1)
Basophils Relative: 0 %
Eosinophils Absolute: 0.1 10*3/uL (ref 0.0–0.5)
Eosinophils Relative: 2 %
HCT: 40.2 % (ref 36.0–46.0)
Hemoglobin: 12.4 g/dL (ref 12.0–15.0)
Immature Granulocytes: 1 %
Lymphocytes Relative: 24 %
Lymphs Abs: 1.3 10*3/uL (ref 0.7–4.0)
MCH: 28.7 pg (ref 26.0–34.0)
MCHC: 30.8 g/dL (ref 30.0–36.0)
MCV: 93.1 fL (ref 80.0–100.0)
Monocytes Absolute: 0.3 10*3/uL (ref 0.1–1.0)
Monocytes Relative: 6 %
Neutro Abs: 3.6 10*3/uL (ref 1.7–7.7)
Neutrophils Relative %: 67 %
Platelet Count: 265 10*3/uL (ref 150–400)
RBC: 4.32 MIL/uL (ref 3.87–5.11)
RDW: 15.3 % (ref 11.5–15.5)
WBC Count: 5.5 10*3/uL (ref 4.0–10.5)
nRBC: 0 % (ref 0.0–0.2)

## 2023-07-19 LAB — FERRITIN: Ferritin: 583 ng/mL — ABNORMAL HIGH (ref 11–307)

## 2023-07-19 LAB — IRON AND IRON BINDING CAPACITY (CC-WL,HP ONLY)
Iron: 77 ug/dL (ref 28–170)
Saturation Ratios: 26 % (ref 10.4–31.8)
TIBC: 298 ug/dL (ref 250–450)
UIBC: 221 ug/dL (ref 148–442)

## 2023-07-20 LAB — KAPPA/LAMBDA LIGHT CHAINS
Kappa free light chain: 1276.1 mg/L — ABNORMAL HIGH (ref 3.3–19.4)
Kappa, lambda light chain ratio: 15.45 — ABNORMAL HIGH (ref 0.26–1.65)
Lambda free light chains: 82.6 mg/L — ABNORMAL HIGH (ref 5.7–26.3)

## 2023-07-21 LAB — MULTIPLE MYELOMA PANEL, SERUM
Albumin SerPl Elph-Mcnc: 3.9 g/dL (ref 2.9–4.4)
Albumin/Glob SerPl: 1.4 (ref 0.7–1.7)
Alpha 1: 0.3 g/dL (ref 0.0–0.4)
Alpha2 Glob SerPl Elph-Mcnc: 1.1 g/dL — ABNORMAL HIGH (ref 0.4–1.0)
B-Globulin SerPl Elph-Mcnc: 0.9 g/dL (ref 0.7–1.3)
Gamma Glob SerPl Elph-Mcnc: 0.4 g/dL (ref 0.4–1.8)
Globulin, Total: 2.8 g/dL (ref 2.2–3.9)
IgA: 62 mg/dL — ABNORMAL LOW (ref 64–422)
IgG (Immunoglobin G), Serum: 509 mg/dL — ABNORMAL LOW (ref 586–1602)
IgM (Immunoglobulin M), Srm: 16 mg/dL — ABNORMAL LOW (ref 26–217)
Total Protein ELP: 6.7 g/dL (ref 6.0–8.5)

## 2023-10-18 ENCOUNTER — Other Ambulatory Visit: Payer: Self-pay | Admitting: Hematology and Oncology

## 2023-10-18 ENCOUNTER — Inpatient Hospital Stay: Payer: Medicare (Managed Care) | Admitting: Hematology and Oncology

## 2023-10-18 ENCOUNTER — Inpatient Hospital Stay: Payer: Medicare (Managed Care) | Attending: Hematology and Oncology

## 2023-10-18 DIAGNOSIS — D472 Monoclonal gammopathy: Secondary | ICD-10-CM

## 2023-10-18 NOTE — Progress Notes (Deleted)
 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 No M protein on IFE 10/22/2022: Hgb 8.9, MCV 98, Plt 281, Cr 5.76 11/04/2022: establish care with Dr. Federico   Interval History:  Sara Davis 73 y.o. female with medical history significant for renal dysfunction, anemia, and smoldering multiple myeloma who presents for a follow up visit. The patient's last visit was on 11/04/2022 at which time she established care. In the interim since the last visit she is undergone a bone survey, bone marrow biopsy, and extensive blood work.  On exam today Sara Davis is accompanied by her son.  She reports she has been well overall interim since her last visit.  She reports her energy is about a 6 out of 10 today.  She notes that she is not having any recent lightheadedness, dizziness, or shortness of breath.  Her appetite is good and she is eating well.  She has not been having any trouble with bleeding, bruising, or dark stools.  She reports that she always has pain in her joints but does not have any new bone or back pain.  She reports she does feel like she is walking better though she does have good and bad days.  She does have some chronic pain in her left shoulder.  She has had no recent viral illnesses such as runny nose, sore throat, cough.  She has not been having any trouble with fevers, chills, sweats.  Full 10 point ROS is otherwise negative.    MEDICAL HISTORY:  Past Medical History:  Diagnosis Date   Anemia    Cellulitis of scalp 02/2015   Chronic kidney disease 01/20/2021   stage V   Depression    Diabetes mellitus  without complication (HCC)    History of blood transfusion 11/2022   Hyperlipidemia    Hyperparathyroidism (HCC)    Hypertension     SURGICAL HISTORY: Past Surgical History:  Procedure Laterality Date   ABDOMINAL HYSTERECTOMY     AV FISTULA PLACEMENT Right 02/05/2021   Procedure: INSERTION OF RIGHT ARM ARTERIOVENOUS (AV) GORE-TEX GRAFT;  Surgeon: Magda Debby SAILOR, MD;  Location: [facility] OR;  Service: Vascular;  Laterality: Right;   BIOPSY  01/07/2018   Procedure: BIOPSY;  Surgeon: Abran Norleen SAILOR, MD;  Location: [facility] ENDOSCOPY;  Service: Endoscopy;;  Clotest   COLONOSCOPY WITH PROPOFOL  N/A 01/07/2018   Procedure: COLONOSCOPY WITH PROPOFOL ;  Surgeon: Abran Norleen SAILOR, MD;  Location: [facility] ENDOSCOPY;  Service: Endoscopy;  Laterality: N/A;   ENTEROSCOPY N/A 06/17/2022   Procedure: ENTEROSCOPY;  Surgeon: Legrand Victory CROME DOUGLAS, MD;  Location: [facility] ENDOSCOPY;  Service: Gastroenterology;  Laterality: N/A;   ENTEROSCOPY N/A 10/20/2022   Procedure: ENTEROSCOPY;  Surgeon: Stacia Glendia BRAVO, MD;  Location: [facility] ENDOSCOPY;  Service: Gastroenterology;  Laterality: N/A;   ESOPHAGOGASTRODUODENOSCOPY N/A 05/27/2023   Procedure: EGD (ESOPHAGOGASTRODUODENOSCOPY);  Surgeon: Nandigam, Kavitha V, MD;  Location: [facility] ENDOSCOPY;  Service: Gastroenterology;  Laterality: N/A;   ESOPHAGOGASTRODUODENOSCOPY (EGD) WITH PROPOFOL  N/A 01/07/2018   Procedure: ESOPHAGOGASTRODUODENOSCOPY (EGD) WITH PROPOFOL ;  Surgeon: Abran Norleen SAILOR, MD;  Location: [facility] ENDOSCOPY;  Service: Endoscopy;  Laterality: N/A;   ESOPHAGOGASTRODUODENOSCOPY (EGD) WITH PROPOFOL  N/A 12/14/2018   Procedure: ESOPHAGOGASTRODUODENOSCOPY (EGD)  WITH PROPOFOL ;  Surgeon: Abran Norleen SAILOR, MD;  Location: [facility] ENDOSCOPY;  Service: Endoscopy;  Laterality: N/A;   GIVENS CAPSULE STUDY N/A 10/18/2022   Procedure: GIVENS CAPSULE STUDY;  Surgeon: Shila Gustav GAILS, MD;  Location: [facility] ENDOSCOPY;  Service: Gastroenterology;  Laterality: N/A;  Per Mansouraty   HEMOSTASIS CLIP PLACEMENT  06/17/2022    Procedure: HEMOSTASIS CLIP PLACEMENT;  Surgeon: Legrand Victory LITTIE DOUGLAS, MD;  Location: [facility] ENDOSCOPY;  Service: Gastroenterology;;   HEMOSTASIS CONTROL  12/14/2018   Procedure: HEMOSTASIS CONTROL;  Surgeon: Abran Norleen SAILOR, MD;  Location: [facility] ENDOSCOPY;  Service: Endoscopy;;   HOT HEMOSTASIS N/A 06/17/2022   Procedure: HOT HEMOSTASIS (ARGON PLASMA COAGULATION/BICAP);  Surgeon: Legrand Victory LITTIE DOUGLAS, MD;  Location: [facility] ENDOSCOPY;  Service: Gastroenterology;  Laterality: N/A;   HOT HEMOSTASIS N/A 10/20/2022   Procedure: HOT HEMOSTASIS (ARGON PLASMA COAGULATION/BICAP);  Surgeon: Stacia Glendia BRAVO, MD;  Location: [facility] ENDOSCOPY;  Service: Gastroenterology;  Laterality: N/A;   HOT HEMOSTASIS N/A 05/27/2023   Procedure: EGD, WITH ARGON PLASMA COAGULATION;  Surgeon: Shila Gustav GAILS, MD;  Location: [facility] ENDOSCOPY;  Service: Gastroenterology;  Laterality: N/A;   PARATHYROIDECTOMY Left 02/09/2023   Procedure: LEFT INFERIOR PARATHYROIDECTOMY;  Surgeon: Eletha Boas, MD;  Location: [facility] OR;  Service: General;  Laterality: Left;   POLYPECTOMY  01/07/2018   Procedure: POLYPECTOMY;  Surgeon: Abran Norleen SAILOR, MD;  Location: [facility] ENDOSCOPY;  Service: Endoscopy;;   POLYPECTOMY  10/20/2022   Procedure: POLYPECTOMY;  Surgeon: Stacia Glendia BRAVO, MD;  Location: [facility] ENDOSCOPY;  Service: Gastroenterology;;   Sara Davis  06/17/2022   Procedure: Sara Davis;  Surgeon: Legrand Victory LITTIE DOUGLAS, MD;  Location: [facility] ENDOSCOPY;  Service: Gastroenterology;;    SOCIAL HISTORY: Social History   Socioeconomic History   Marital status: Single    Spouse name: Not on file   Number of children: 2   Years of education: Not on file   Highest education level: Not on file  Occupational History   Occupation: Retired  Tobacco Use   Smoking status: Every Day    Current packs/day: 0.50    Types: Cigarettes   Smokeless tobacco: Never  Vaping Use   Vaping status: Never Used  Substance and Sexual Activity   Alcohol use: No    Alcohol/week: 0.0  standard drinks of alcohol   Drug use: No   Sexual activity: Not on file  Other Topics Concern   Not on file  Social History Narrative   Not on file   Social Drivers of Health   Financial Resource Strain: Not on file  Food Insecurity: No Food Insecurity (05/25/2023)   Hunger Vital Sign    Worried About Running Out of Food in the Last Year: Never true    Ran Out of Food in the Last Year: Never true  Transportation Needs: No Transportation Needs (05/25/2023)   PRAPARE - Transportation    Lack of Transportation (Medical): No    Lack of Transportation (Non-Medical): No  Physical Activity: Not on file  Stress: Not on file  Social Connections: Socially Isolated (05/26/2023)   Social Connection and Isolation Panel    Frequency of Communication with Friends and Family: Never    Frequency of Social Gatherings with Friends and Family: Never    Attends Religious Services: Never    Database administrator or Organizations: No    Attends Banker Meetings: Never    Marital Status: Divorced  Catering manager Violence: Not At Risk (05/25/2023)   Humiliation, Afraid, Rape, and Kick questionnaire  Fear of Current or Ex-Partner: No    Emotionally Abused: No    Physically Abused: No    Sexually Abused: No    FAMILY HISTORY: Family History  Problem Relation Age of Onset   Diabetes Mother    Cancer Mother    Diabetes Sister    Hypertension Sister    Cancer Sister 42       Bone   Diabetes Brother    Diabetes Sister    Diabetes Sister     ALLERGIES:  is allergic to aspirin .  MEDICATIONS:  Current Outpatient Medications  Medication Sig Dispense Refill   albuterol  (VENTOLIN  HFA) 108 (90 Base) MCG/ACT inhaler Inhale 1-2 puffs into the lungs every 6 (six) hours as needed for wheezing or shortness of breath.     amLODipine  (NORVASC ) 5 MG tablet Take 1 tablet by mouth daily.     atorvastatin  (LIPITOR ) 80 MG tablet Take 1 tablet (80 mg total) by mouth daily. 90 tablet 1   B  Complex-C-Folic Acid  (DIALYVITE TABLET) TABS Take 1 tablet by mouth daily.     calcitRIOL  (ROCALTROL ) 0.5 MCG capsule Take 4 capsules (2 mcg total) by mouth 2 (two) times daily. 240 capsule 6   calcium  carbonate (OS-CAL - DOSED IN MG OF ELEMENTAL CALCIUM ) 1250 (500 Ca) MG tablet Take 4 tablets (5,000 mg total) by mouth 4 (four) times daily. 480 tablet 6   carvedilol  (COREG ) 25 MG tablet Take 1 tablet (25 mg total) by mouth 2 (two) times daily with a meal. 180 tablet 3   Methoxy PEG-Epoetin  Beta (MIRCERA IJ) 225 mcg. Every 2 weeks during dialysis     pantoprazole  (PROTONIX ) 40 MG tablet Take 1 tablet (40 mg total) by mouth 2 (two) times daily. 60 tablet 0   No current facility-administered medications for this visit.    REVIEW OF SYSTEMS:   Constitutional: ( - ) fevers, ( - )  chills , ( - ) night sweats Eyes: ( - ) blurriness of vision, ( - ) double vision, ( - ) watery eyes Ears, nose, mouth, throat, and face: ( - ) mucositis, ( - ) sore throat Respiratory: ( - ) cough, ( - ) dyspnea, ( - ) wheezes Cardiovascular: ( - ) palpitation, ( - ) chest discomfort, ( - ) lower extremity swelling Gastrointestinal:  ( - ) nausea, ( - ) heartburn, ( - ) change in bowel habits Skin: ( - ) abnormal skin rashes Lymphatics: ( - ) new lymphadenopathy, ( - ) easy bruising Neurological: ( - ) numbness, ( - ) tingling, ( - ) new weaknesses Behavioral/Psych: ( - ) mood change, ( - ) new changes  All other systems were reviewed with the patient and are negative.  PHYSICAL EXAMINATION: ECOG PERFORMANCE STATUS: 1 - Symptomatic but completely ambulatory  There were no vitals filed for this visit.   There were no vitals filed for this visit.    GENERAL: Well-appearing elderly African-American female, alert, no distress and comfortable SKIN: skin color, texture, turgor are normal, no rashes or significant lesions EYES: conjunctiva are pink and non-injected, sclera clear LUNGS: clear to auscultation and  percussion with normal breathing effort HEART: regular rate & rhythm and no murmurs and no lower extremity edema Musculoskeletal: no cyanosis of digits and no clubbing  PSYCH: alert & oriented x 3, fluent speech NEURO: no focal motor/sensory deficits  LABORATORY DATA:  I have reviewed the data as listed    Latest Ref Rng & Units 07/19/2023  9:56 AM 07/02/2023    2:18 PM 06/01/2023    4:10 AM  CBC  WBC 4.0 - 10.5 K/uL 5.5  4.7  6.6   Hemoglobin 12.0 - 15.0 g/dL 87.5  89.7  8.4   Hematocrit 36.0 - 46.0 % 40.2  31.5  27.3   Platelets 150 - 400 K/uL 265  255.0  311        Latest Ref Rng & Units 07/19/2023    9:56 AM 06/01/2023    4:10 AM 05/27/2023    6:41 AM  CMP  Glucose 70 - 99 mg/dL 831  800  852   BUN 8 - 23 mg/dL 62  27  28   Creatinine 0.44 - 1.00 mg/dL 3.72  2.93  4.66   Sodium 135 - 145 mmol/L 143  142  138   Potassium 3.5 - 5.1 mmol/L 4.6  3.6  4.2   Chloride 98 - 111 mmol/L 99  97  99   CO2 22 - 32 mmol/L 29  30  27    Calcium  8.9 - 10.3 mg/dL 8.8  8.5  8.1   Total Protein 6.5 - 8.1 g/dL 7.4  6.1    Total Bilirubin 0.0 - 1.2 mg/dL 0.4  0.6    Alkaline Phos 38 - 126 U/L 283  348    AST 15 - 41 U/L 12  14    ALT 0 - 44 U/L 6  8      Lab Results  Component Value Date   MPROTEIN Not Observed 07/19/2023   MPROTEIN Not Observed 04/19/2023   MPROTEIN Not Observed 01/11/2023   Lab Results  Component Value Date   KPAFRELGTCHN 1,276.1 (H) 07/19/2023   KPAFRELGTCHN 1,423.3 (H) 04/19/2023   KPAFRELGTCHN 1,517.4 (H) 01/11/2023   LAMBDASER 82.6 (H) 07/19/2023   LAMBDASER 69.9 (H) 04/19/2023   LAMBDASER 49.7 (H) 01/11/2023   KAPLAMBRATIO 15.45 (H) 07/19/2023   KAPLAMBRATIO 20.36 (H) 04/19/2023   KAPLAMBRATIO 30.53 (H) 01/11/2023    RADIOGRAPHIC STUDIES: No results found.  ASSESSMENT & PLAN AHLANA SLAYDON 73 y.o. female with medical history significant for renal dysfunction, anemia, and smoldering multiple myeloma who presents for a follow up visit.  This patient  has a complex set of results which are difficult to interpret.  At this time we know the patient has at least smoldering multiple myeloma, however it is possible she has multiple myeloma.  The 2 possibilities are:   1) the patient has multiple myeloma which is the source of her renal dysfunction and anemia. 2) her renal dysfunction is due to a different etiology and is causing her anemia.  In this case she would have incidental an indolent smoldering myeloma.  Discussed with nephrology who recommended avoiding kidney biopsy as results would not likely be helpful given the chronicity of her CKD. Will continue to monitor.   # Smoldering Multiple Myeloma.  # Elevated Serum Free Light Chain Ratio with No M protein --At this time the patient has at least smoldering multiple myeloma - metastatic bone survey was inconclusive but most consistent with hyperparathyroidism. --Bone marrow biopsy revealed over 30% plasma cells, however in the absence of endorgan damage this represents smoldering myeloma. --discussed the need for a biopsy of the kidney to determine if the etiology of the patient's kidney dysfunction is the elevated serum light chains. Nephrology recommended against biopsy, did not think in setting of her CKD that results would be helpful.  --labs today show white blood cell 4.8, hemoglobin 12.0, MCV  89.9, platelets 300.  Patient is being treated with EPO.  --RTC in 3 months for labs and 6 months for clinic visit.   No orders of the defined types were placed in this encounter.   All questions were answered. The patient knows to call the clinic with any problems, questions or concerns.  A total of more than 30 minutes were spent on this encounter with face-to-face time and non-face-to-face time, including preparing to see the patient, ordering tests and/or medications, counseling the patient and coordination of care as outlined above.   Norleen IVAR Kidney, MD Department of  Hematology/Oncology [facility] Phone: [phone] Pager: [phone] Email: [email or web address]  10/18/2023 7:42 AM

## 2023-10-25 ENCOUNTER — Other Ambulatory Visit: Payer: Medicare (Managed Care)

## 2023-10-25 ENCOUNTER — Ambulatory Visit: Payer: Medicare (Managed Care) | Admitting: Hematology and Oncology

## 2023-12-29 ENCOUNTER — Inpatient Hospital Stay: Payer: Medicare (Managed Care) | Admitting: Physician Assistant

## 2023-12-29 ENCOUNTER — Inpatient Hospital Stay: Payer: Medicare (Managed Care) | Attending: Hematology and Oncology

## 2024-01-06 ENCOUNTER — Telehealth: Payer: Self-pay | Admitting: Physician Assistant

## 2024-01-14 ENCOUNTER — Inpatient Hospital Stay: Payer: Medicare (Managed Care) | Admitting: Physician Assistant

## 2024-01-14 ENCOUNTER — Inpatient Hospital Stay: Payer: Medicare (Managed Care)

## 2024-01-20 NOTE — Progress Notes (Unsigned)
 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 No M protein on IFE 10/22/2022: Hgb 8.9, MCV 98, Plt 281, Cr 5.76 11/04/2022: establish care with Dr. Federico   Interval History:  Sara Davis 73 y.o. female with medical history significant for renal dysfunction, anemia, and smoldering multiple myeloma who presents for a follow up visit. The patient's last visit was on 04/19/2023.   On exam today Sara Davis is accompanied by her son.  She reports ***   MEDICAL HISTORY:  Past Medical History:  Diagnosis Date   Anemia    Cellulitis of scalp 02/2015   Chronic kidney disease 01/20/2021   stage V   Depression    Diabetes mellitus without complication (HCC)    History of blood transfusion 11/2022   Hyperlipidemia    Hyperparathyroidism    Hypertension     SURGICAL HISTORY: Past Surgical History:  Procedure Laterality Date   ABDOMINAL HYSTERECTOMY     AV FISTULA PLACEMENT Right 02/05/2021   Procedure: INSERTION OF RIGHT ARM ARTERIOVENOUS (AV) GORE-TEX GRAFT;  Surgeon: Magda Debby SAILOR, MD;  Location: [facility] OR;  Service: Vascular;  Laterality: Right;   BIOPSY  01/07/2018   Procedure: BIOPSY;  Surgeon: Abran Norleen SAILOR, MD;  Location: [facility] ENDOSCOPY;  Service: Endoscopy;;  Clotest   COLONOSCOPY WITH PROPOFOL  N/A 01/07/2018   Procedure: COLONOSCOPY WITH PROPOFOL ;  Surgeon: Abran Norleen SAILOR, MD;  Location: [facility] ENDOSCOPY;  Service: Endoscopy;  Laterality: N/A;   ENTEROSCOPY N/A 06/17/2022   Procedure: ENTEROSCOPY;  Surgeon: Legrand Victory CROME DOUGLAS, MD;  Location: [facility] ENDOSCOPY;  Service: Gastroenterology;  Laterality: N/A;    ENTEROSCOPY N/A 10/20/2022   Procedure: ENTEROSCOPY;  Surgeon: Stacia Glendia BRAVO, MD;  Location: [facility] ENDOSCOPY;  Service: Gastroenterology;  Laterality: N/A;   ESOPHAGOGASTRODUODENOSCOPY N/A 05/27/2023   Procedure: EGD (ESOPHAGOGASTRODUODENOSCOPY);  Surgeon: Nandigam, Kavitha V, MD;  Location: [facility] ENDOSCOPY;  Service: Gastroenterology;  Laterality: N/A;   ESOPHAGOGASTRODUODENOSCOPY (EGD) WITH PROPOFOL  N/A 01/07/2018   Procedure: ESOPHAGOGASTRODUODENOSCOPY (EGD) WITH PROPOFOL ;  Surgeon: Abran Norleen SAILOR, MD;  Location: [facility] ENDOSCOPY;  Service: Endoscopy;  Laterality: N/A;   ESOPHAGOGASTRODUODENOSCOPY (EGD) WITH PROPOFOL  N/A 12/14/2018   Procedure: ESOPHAGOGASTRODUODENOSCOPY (EGD) WITH PROPOFOL ;  Surgeon: Abran Norleen SAILOR, MD;  Location: [facility] ENDOSCOPY;  Service: Endoscopy;  Laterality: N/A;   GIVENS CAPSULE STUDY N/A 10/18/2022   Procedure: GIVENS CAPSULE STUDY;  Surgeon: Shila Gustav GAILS, MD;  Location: [facility] ENDOSCOPY;  Service: Gastroenterology;  Laterality: N/A;  Per Mansouraty   HEMOSTASIS CLIP PLACEMENT  06/17/2022   Procedure: HEMOSTASIS CLIP PLACEMENT;  Surgeon: Legrand Victory CROME DOUGLAS, MD;  Location: [facility] ENDOSCOPY;  Service: Gastroenterology;;   HEMOSTASIS CONTROL  12/14/2018   Procedure: HEMOSTASIS CONTROL;  Surgeon: Abran Norleen SAILOR, MD;  Location: [facility] ENDOSCOPY;  Service: Endoscopy;;   HOT HEMOSTASIS N/A 06/17/2022   Procedure: HOT HEMOSTASIS (ARGON PLASMA COAGULATION/BICAP);  Surgeon: Legrand Victory CROME DOUGLAS, MD;  Location: [facility] ENDOSCOPY;  Service: Gastroenterology;  Laterality: N/A;   HOT HEMOSTASIS N/A 10/20/2022   Procedure: HOT HEMOSTASIS (ARGON PLASMA COAGULATION/BICAP);  Surgeon: Stacia Glendia BRAVO, MD;  Location: [facility] ENDOSCOPY;  Service: Gastroenterology;  Laterality: N/A;   HOT HEMOSTASIS N/A 05/27/2023   Procedure: EGD, WITH ARGON PLASMA  COAGULATION;  Surgeon: Shila Gustav GAILS, MD;  Location: [facility] ENDOSCOPY;  Service: Gastroenterology;  Laterality: N/A;   PARATHYROIDECTOMY Left 02/09/2023   Procedure: LEFT  INFERIOR PARATHYROIDECTOMY;  Surgeon: Eletha Boas, MD;  Location: [facility] OR;  Service: General;  Laterality: Left;   POLYPECTOMY  01/07/2018   Procedure: POLYPECTOMY;  Surgeon: Abran Norleen SAILOR, MD;  Location: [facility] ENDOSCOPY;  Service: Endoscopy;;   POLYPECTOMY  10/20/2022   Procedure: POLYPECTOMY;  Surgeon: Stacia Glendia BRAVO, MD;  Location: [facility] ENDOSCOPY;  Service: Gastroenterology;;   MATIAS  06/17/2022   Procedure: MATIAS;  Surgeon: Legrand Victory LITTIE DOUGLAS, MD;  Location: [facility] ENDOSCOPY;  Service: Gastroenterology;;    SOCIAL HISTORY: Social History   Socioeconomic History   Marital status: Single    Spouse name: Not on file   Number of children: 2   Years of education: Not on file   Highest education level: Not on file  Occupational History   Occupation: Retired  Tobacco Use   Smoking status: Every Day    Current packs/day: 0.50    Types: Cigarettes   Smokeless tobacco: Never  Vaping Use   Vaping status: Never Used  Substance and Sexual Activity   Alcohol use: No    Alcohol/week: 0.0 standard drinks of alcohol   Drug use: No   Sexual activity: Not on file  Other Topics Concern   Not on file  Social History Narrative   Not on file   Social Drivers of Health   Tobacco Use: High Risk (07/02/2023)   Patient History    Smoking Tobacco Use: Every Day    Smokeless Tobacco Use: Never    Passive Exposure: Not on file  Financial Resource Strain: Not on file  Food Insecurity: No Food Insecurity (05/25/2023)   Hunger Vital Sign    Worried About Running Out of Food in the Last Year: Never true    Ran Out of Food in the Last Year: Never true  Transportation Needs: No Transportation Needs (05/25/2023)   PRAPARE - Transportation    Lack of Transportation (Medical): No    Lack of Transportation (Non-Medical): No  Physical Activity: Not on file  Stress: Not on file  Social Connections: Socially Isolated (05/26/2023)   Social Connection and Isolation Panel    Frequency of  Communication with Friends and Family: Never    Frequency of Social Gatherings with Friends and Family: Never    Attends Religious Services: Never    Database Administrator or Organizations: No    Attends Banker Meetings: Never    Marital Status: Divorced  Catering Manager Violence: Not At Risk (05/25/2023)   Humiliation, Afraid, Rape, and Kick questionnaire    Fear of Current or Ex-Partner: No    Emotionally Abused: No    Physically Abused: No    Sexually Abused: No  Depression (PHQ2-9): Not on file  Alcohol Screen: Not on file  Housing: Low Risk (05/26/2023)   Housing Stability Vital Sign    Unable to Pay for Housing in the Last Year: No    Number of Times Moved in the Last Year: 0    Homeless in the Last Year: No  Utilities: Not At Risk (05/25/2023)   AHC Utilities    Threatened with loss of utilities: No  Health Literacy: Not on file    FAMILY HISTORY: Family History  Problem Relation Age of Onset   Diabetes Mother    Cancer Mother    Diabetes Sister    Hypertension Sister  Cancer Sister 29       Bone   Diabetes Brother    Diabetes Sister    Diabetes Sister     ALLERGIES:  is allergic to aspirin .  MEDICATIONS:  Current Outpatient Medications  Medication Sig Dispense Refill   albuterol  (VENTOLIN  HFA) 108 (90 Base) MCG/ACT inhaler Inhale 1-2 puffs into the lungs every 6 (six) hours as needed for wheezing or shortness of breath.     amLODipine  (NORVASC ) 5 MG tablet Take 1 tablet by mouth daily.     atorvastatin  (LIPITOR ) 80 MG tablet Take 1 tablet (80 mg total) by mouth daily. 90 tablet 1   B Complex-C-Folic Acid  (DIALYVITE TABLET) TABS Take 1 tablet by mouth daily.     calcitRIOL  (ROCALTROL ) 0.5 MCG capsule Take 4 capsules (2 mcg total) by mouth 2 (two) times daily. 240 capsule 6   calcium  carbonate (OS-CAL - DOSED IN MG OF ELEMENTAL CALCIUM ) 1250 (500 Ca) MG tablet Take 4 tablets (5,000 mg total) by mouth 4 (four) times daily. 480 tablet 6    carvedilol  (COREG ) 25 MG tablet Take 1 tablet (25 mg total) by mouth 2 (two) times daily with a meal. 180 tablet 3   Methoxy PEG-Epoetin  Beta (MIRCERA IJ) 225 mcg. Every 2 weeks during dialysis     pantoprazole  (PROTONIX ) 40 MG tablet Take 1 tablet (40 mg total) by mouth 2 (two) times daily. 60 tablet 0   No current facility-administered medications for this visit.    REVIEW OF SYSTEMS:   Constitutional: ( - ) fevers, ( - )  chills , ( - ) night sweats Eyes: ( - ) blurriness of vision, ( - ) double vision, ( - ) watery eyes Ears, nose, mouth, throat, and face: ( - ) mucositis, ( - ) sore throat Respiratory: ( - ) cough, ( - ) dyspnea, ( - ) wheezes Cardiovascular: ( - ) palpitation, ( - ) chest discomfort, ( - ) lower extremity swelling Gastrointestinal:  ( - ) nausea, ( - ) heartburn, ( - ) change in bowel habits Skin: ( - ) abnormal skin rashes Lymphatics: ( - ) new lymphadenopathy, ( - ) easy bruising Neurological: ( - ) numbness, ( - ) tingling, ( - ) new weaknesses Behavioral/Psych: ( - ) mood change, ( - ) new changes  All other systems were reviewed with the patient and are negative.  PHYSICAL EXAMINATION: ECOG PERFORMANCE STATUS: 1 - Symptomatic but completely ambulatory  There were no vitals filed for this visit.   There were no vitals filed for this visit.    GENERAL: Well-appearing elderly African-American female, alert, no distress and comfortable SKIN: skin color, texture, turgor are normal, no rashes or significant lesions EYES: conjunctiva are pink and non-injected, sclera clear LUNGS: clear to auscultation and percussion with normal breathing effort HEART: regular rate & rhythm and no murmurs and no lower extremity edema Musculoskeletal: no cyanosis of digits and no clubbing  PSYCH: alert & oriented x 3, fluent speech NEURO: no focal motor/sensory deficits  LABORATORY DATA:  I have reviewed the data as listed    Latest Ref Rng & Units 07/19/2023    9:56 AM  07/02/2023    2:18 PM 06/01/2023    4:10 AM  CBC  WBC 4.0 - 10.5 K/uL 5.5  4.7  6.6   Hemoglobin 12.0 - 15.0 g/dL 87.5  89.7  8.4   Hematocrit 36.0 - 46.0 % 40.2  31.5  27.3   Platelets 150 - 400 K/uL  265  255.0  311        Latest Ref Rng & Units 07/19/2023    9:56 AM 06/01/2023    4:10 AM 05/27/2023    6:41 AM  CMP  Glucose 70 - 99 mg/dL 831  800  852   BUN 8 - 23 mg/dL 62  27  28   Creatinine 0.44 - 1.00 mg/dL 3.72  2.93  4.66   Sodium 135 - 145 mmol/L 143  142  138   Potassium 3.5 - 5.1 mmol/L 4.6  3.6  4.2   Chloride 98 - 111 mmol/L 99  97  99   CO2 22 - 32 mmol/L 29  30  27    Calcium  8.9 - 10.3 mg/dL 8.8  8.5  8.1   Total Protein 6.5 - 8.1 g/dL 7.4  6.1    Total Bilirubin 0.0 - 1.2 mg/dL 0.4  0.6    Alkaline Phos 38 - 126 U/L 283  348    AST 15 - 41 U/L 12  14    ALT 0 - 44 U/L 6  8      Lab Results  Component Value Date   MPROTEIN Not Observed 07/19/2023   MPROTEIN Not Observed 04/19/2023   MPROTEIN Not Observed 01/11/2023   Lab Results  Component Value Date   KPAFRELGTCHN 1,276.1 (H) 07/19/2023   KPAFRELGTCHN 1,423.3 (H) 04/19/2023   KPAFRELGTCHN 1,517.4 (H) 01/11/2023   LAMBDASER 82.6 (H) 07/19/2023   LAMBDASER 69.9 (H) 04/19/2023   LAMBDASER 49.7 (H) 01/11/2023   KAPLAMBRATIO 15.45 (H) 07/19/2023   KAPLAMBRATIO 20.36 (H) 04/19/2023   KAPLAMBRATIO 30.53 (H) 01/11/2023    RADIOGRAPHIC STUDIES: No results found.  ASSESSMENT & PLAN Sara Davis 73 y.o. female with medical history significant for renal dysfunction, anemia, and smoldering multiple myeloma who presents for a follow up visit.  # Smoldering Multiple Myeloma.  # Elevated Serum Free Light Chain Ratio with No M protein --At this time the patient has at least smoldering multiple myeloma - metastatic bone survey was inconclusive but most consistent with hyperparathyroidism. --Bone marrow biopsy revealed over 30% plasma cells, however in the absence of endorgan damage this represents smoldering  myeloma. --discussed the need for a biopsy of the kidney to determine if the etiology of the patient's kidney dysfunction is the elevated serum light chains. Nephrology recommended against biopsy, did not think in setting of her CKD that results would be helpful.  --labs today show *** --RTC in 3 months for labs and 6 months for clinic visit.   No orders of the defined types were placed in this encounter.   All questions were answered. The patient knows to call the clinic with any problems, questions or concerns.   I have spent a total of {CHL ONC TIME VISIT - [id number]} minutes of face-to-face and non-face-to-face time, preparing to see the patient, obtaining and/or reviewing separately obtained history, performing a medically appropriate examination, counseling and educating the patient, ordering medications/tests/procedures, referring and communicating with other health care professionals, documenting clinical information in the electronic health record, independently interpreting results and communicating results to the patient, and care coordination.   Johnston Police PA-C Dept of Hematology and Oncology [facility] Phone: [phone]   01/20/2024 11:11 PM

## 2024-01-21 ENCOUNTER — Inpatient Hospital Stay: Payer: Medicare (Managed Care) | Admitting: Physician Assistant

## 2024-01-21 ENCOUNTER — Inpatient Hospital Stay: Payer: Medicare (Managed Care) | Attending: Hematology and Oncology

## 2024-01-21 VITALS — BP 152/65 | HR 91 | Temp 97.9°F | Resp 18 | Wt 128.7 lb

## 2024-01-21 DIAGNOSIS — D472 Monoclonal gammopathy: Secondary | ICD-10-CM | POA: Insufficient documentation

## 2024-01-21 DIAGNOSIS — E1122 Type 2 diabetes mellitus with diabetic chronic kidney disease: Secondary | ICD-10-CM | POA: Diagnosis not present

## 2024-01-21 DIAGNOSIS — I120 Hypertensive chronic kidney disease with stage 5 chronic kidney disease or end stage renal disease: Secondary | ICD-10-CM | POA: Diagnosis not present

## 2024-01-21 DIAGNOSIS — Z992 Dependence on renal dialysis: Secondary | ICD-10-CM | POA: Diagnosis not present

## 2024-01-21 DIAGNOSIS — N186 End stage renal disease: Secondary | ICD-10-CM | POA: Diagnosis not present

## 2024-01-21 DIAGNOSIS — F17210 Nicotine dependence, cigarettes, uncomplicated: Secondary | ICD-10-CM | POA: Insufficient documentation

## 2024-01-21 LAB — CMP (CANCER CENTER ONLY)
ALT: 9 U/L (ref 0–44)
AST: 19 U/L (ref 15–41)
Albumin: 4.8 g/dL (ref 3.5–5.0)
Alkaline Phosphatase: 202 U/L — ABNORMAL HIGH (ref 38–126)
Anion gap: 13 (ref 5–15)
BUN: 30 mg/dL — ABNORMAL HIGH (ref 8–23)
CO2: 33 mmol/L — ABNORMAL HIGH (ref 22–32)
Calcium: 9 mg/dL (ref 8.9–10.3)
Chloride: 95 mmol/L — ABNORMAL LOW (ref 98–111)
Creatinine: 4.91 mg/dL — ABNORMAL HIGH (ref 0.44–1.00)
GFR, Estimated: 9 mL/min — ABNORMAL LOW
Glucose, Bld: 168 mg/dL — ABNORMAL HIGH (ref 70–99)
Potassium: 4.1 mmol/L (ref 3.5–5.1)
Sodium: 142 mmol/L (ref 135–145)
Total Bilirubin: 0.2 mg/dL (ref 0.0–1.2)
Total Protein: 7.3 g/dL (ref 6.5–8.1)

## 2024-01-21 LAB — CBC WITH DIFFERENTIAL (CANCER CENTER ONLY)
Abs Immature Granulocytes: 0.03 K/uL (ref 0.00–0.07)
Basophils Absolute: 0 K/uL (ref 0.0–0.1)
Basophils Relative: 1 %
Eosinophils Absolute: 0.1 K/uL (ref 0.0–0.5)
Eosinophils Relative: 1 %
HCT: 36.7 % (ref 36.0–46.0)
Hemoglobin: 11.5 g/dL — ABNORMAL LOW (ref 12.0–15.0)
Immature Granulocytes: 1 %
Lymphocytes Relative: 21 %
Lymphs Abs: 1.4 K/uL (ref 0.7–4.0)
MCH: 27.4 pg (ref 26.0–34.0)
MCHC: 31.3 g/dL (ref 30.0–36.0)
MCV: 87.6 fL (ref 80.0–100.0)
Monocytes Absolute: 0.3 K/uL (ref 0.1–1.0)
Monocytes Relative: 4 %
Neutro Abs: 4.8 K/uL (ref 1.7–7.7)
Neutrophils Relative %: 72 %
Platelet Count: 264 K/uL (ref 150–400)
RBC: 4.19 MIL/uL (ref 3.87–5.11)
RDW: 15.4 % (ref 11.5–15.5)
WBC Count: 6.7 K/uL (ref 4.0–10.5)
nRBC: 0 % (ref 0.0–0.2)

## 2024-01-21 LAB — LACTATE DEHYDROGENASE: LDH: 216 U/L (ref 105–235)

## 2024-01-24 LAB — KAPPA/LAMBDA LIGHT CHAINS
Kappa free light chain: 1148.9 mg/L — ABNORMAL HIGH (ref 3.3–19.4)
Kappa, lambda light chain ratio: 16.6 — ABNORMAL HIGH (ref 0.26–1.65)
Lambda free light chains: 69.2 mg/L — ABNORMAL HIGH (ref 5.7–26.3)

## 2024-01-26 LAB — MULTIPLE MYELOMA PANEL, SERUM
Albumin SerPl Elph-Mcnc: 4.3 g/dL (ref 2.9–4.4)
Albumin/Glob SerPl: 1.8 — ABNORMAL HIGH (ref 0.7–1.7)
Alpha 1: 0.2 g/dL (ref 0.0–0.4)
Alpha2 Glob SerPl Elph-Mcnc: 0.9 g/dL (ref 0.4–1.0)
B-Globulin SerPl Elph-Mcnc: 0.8 g/dL (ref 0.7–1.3)
Gamma Glob SerPl Elph-Mcnc: 0.4 g/dL (ref 0.4–1.8)
Globulin, Total: 2.4 g/dL (ref 2.2–3.9)
IgA: 54 mg/dL — ABNORMAL LOW (ref 64–422)
IgG (Immunoglobin G), Serum: 475 mg/dL — ABNORMAL LOW (ref 586–1602)
IgM (Immunoglobulin M), Srm: 14 mg/dL — ABNORMAL LOW (ref 26–217)
Total Protein ELP: 6.7 g/dL (ref 6.0–8.5)

## 2024-01-28 ENCOUNTER — Ambulatory Visit (HOSPITAL_COMMUNITY)
Admission: RE | Admit: 2024-01-28 | Discharge: 2024-01-28 | Disposition: A | Discharge status: Home / Self Care | Payer: Medicare (Managed Care) | Source: Ambulatory Visit | Attending: Physician Assistant | Admitting: Physician Assistant

## 2024-01-28 DIAGNOSIS — D472 Monoclonal gammopathy: Secondary | ICD-10-CM | POA: Diagnosis present

## 2024-02-17 ENCOUNTER — Ambulatory Visit: Payer: Self-pay | Admitting: Physician Assistant

## 2024-07-07 ENCOUNTER — Inpatient Hospital Stay: Payer: Medicare (Managed Care)

## 2024-07-21 ENCOUNTER — Inpatient Hospital Stay: Payer: Medicare (Managed Care) | Admitting: Physician Assistant
